# Patient Record
Sex: Male | Born: 1940 | Hispanic: No | Marital: Married | Smoking: Current some day smoker
Health system: Southern US, Community
[De-identification: ages and names within clinical notes are randomized; demographics above are authoritative.]

## PROBLEM LIST (undated history)

## (undated) DIAGNOSIS — I252 Old myocardial infarction: Secondary | ICD-10-CM

## (undated) DIAGNOSIS — I10 Essential (primary) hypertension: Secondary | ICD-10-CM

## (undated) DIAGNOSIS — C4339 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: Secondary | ICD-10-CM

## (undated) DIAGNOSIS — J449 Chronic obstructive pulmonary disease, unspecified: Secondary | ICD-10-CM

## (undated) DIAGNOSIS — C801 Malignant (primary) neoplasm, unspecified: Secondary | ICD-10-CM

## (undated) DIAGNOSIS — I509 Heart failure, unspecified: Secondary | ICD-10-CM

## (undated) DIAGNOSIS — E785 Hyperlipidemia, unspecified: Secondary | ICD-10-CM

## (undated) DIAGNOSIS — E119 Type 2 diabetes mellitus without complications: Secondary | ICD-10-CM

## (undated) HISTORY — DX: Old myocardial infarction: I25.2

## (undated) HISTORY — PX: CARDIAC CATHETERIZATION: SHX172

## (undated) HISTORY — DX: Hyperlipidemia, unspecified: E78.5

## (undated) HISTORY — DX: Chronic obstructive pulmonary disease, unspecified: J44.9

## (undated) HISTORY — PX: CORONARY ANGIOPLASTY: SHX604

## (undated) HISTORY — DX: 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: C43.39

## (undated) HISTORY — PX: APPENDECTOMY: SHX54

## (undated) HISTORY — PX: PACEMAKER INSERTION: SHX728

## (undated) MED FILL — Nivolumab IV Soln 240 MG/24ML: INTRAVENOUS | Qty: 48 | Status: AC

---

## 2015-03-20 DIAGNOSIS — C4339 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: Secondary | ICD-10-CM | POA: Insufficient documentation

## 2015-08-15 DIAGNOSIS — I10 Essential (primary) hypertension: Secondary | ICD-10-CM | POA: Diagnosis not present

## 2015-08-15 DIAGNOSIS — E119 Type 2 diabetes mellitus without complications: Secondary | ICD-10-CM | POA: Diagnosis not present

## 2015-08-15 DIAGNOSIS — Z803 Family history of malignant neoplasm of breast: Secondary | ICD-10-CM | POA: Diagnosis not present

## 2015-08-15 DIAGNOSIS — Z809 Family history of malignant neoplasm, unspecified: Secondary | ICD-10-CM

## 2015-08-15 DIAGNOSIS — Z8041 Family history of malignant neoplasm of ovary: Secondary | ICD-10-CM

## 2015-08-15 DIAGNOSIS — C4330 Malignant melanoma of unspecified part of face: Secondary | ICD-10-CM | POA: Diagnosis not present

## 2015-09-04 DIAGNOSIS — C4339 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: Secondary | ICD-10-CM | POA: Diagnosis not present

## 2015-09-10 ENCOUNTER — Encounter: Payer: Self-pay | Admitting: Oncology

## 2015-09-24 DIAGNOSIS — I10 Essential (primary) hypertension: Secondary | ICD-10-CM

## 2015-09-24 DIAGNOSIS — C4330 Malignant melanoma of unspecified part of face: Secondary | ICD-10-CM | POA: Diagnosis not present

## 2015-09-24 DIAGNOSIS — E119 Type 2 diabetes mellitus without complications: Secondary | ICD-10-CM | POA: Diagnosis not present

## 2015-09-24 DIAGNOSIS — R0609 Other forms of dyspnea: Secondary | ICD-10-CM | POA: Diagnosis not present

## 2015-09-24 DIAGNOSIS — J449 Chronic obstructive pulmonary disease, unspecified: Secondary | ICD-10-CM | POA: Diagnosis not present

## 2015-10-15 DIAGNOSIS — R195 Other fecal abnormalities: Secondary | ICD-10-CM

## 2015-10-15 DIAGNOSIS — I10 Essential (primary) hypertension: Secondary | ICD-10-CM

## 2015-10-15 DIAGNOSIS — C4330 Malignant melanoma of unspecified part of face: Secondary | ICD-10-CM | POA: Diagnosis not present

## 2015-10-15 DIAGNOSIS — E119 Type 2 diabetes mellitus without complications: Secondary | ICD-10-CM | POA: Diagnosis not present

## 2015-10-15 DIAGNOSIS — J449 Chronic obstructive pulmonary disease, unspecified: Secondary | ICD-10-CM | POA: Diagnosis not present

## 2015-12-17 DIAGNOSIS — I10 Essential (primary) hypertension: Secondary | ICD-10-CM

## 2015-12-17 DIAGNOSIS — E538 Deficiency of other specified B group vitamins: Secondary | ICD-10-CM | POA: Diagnosis not present

## 2015-12-17 DIAGNOSIS — E119 Type 2 diabetes mellitus without complications: Secondary | ICD-10-CM

## 2015-12-17 DIAGNOSIS — D649 Anemia, unspecified: Secondary | ICD-10-CM | POA: Diagnosis not present

## 2015-12-17 DIAGNOSIS — C4330 Malignant melanoma of unspecified part of face: Secondary | ICD-10-CM | POA: Diagnosis not present

## 2015-12-17 DIAGNOSIS — L989 Disorder of the skin and subcutaneous tissue, unspecified: Secondary | ICD-10-CM | POA: Diagnosis not present

## 2015-12-31 DIAGNOSIS — E119 Type 2 diabetes mellitus without complications: Secondary | ICD-10-CM | POA: Diagnosis not present

## 2015-12-31 DIAGNOSIS — E876 Hypokalemia: Secondary | ICD-10-CM

## 2015-12-31 DIAGNOSIS — E538 Deficiency of other specified B group vitamins: Secondary | ICD-10-CM | POA: Diagnosis not present

## 2015-12-31 DIAGNOSIS — I10 Essential (primary) hypertension: Secondary | ICD-10-CM | POA: Diagnosis not present

## 2015-12-31 DIAGNOSIS — C4330 Malignant melanoma of unspecified part of face: Secondary | ICD-10-CM | POA: Diagnosis not present

## 2016-01-28 DIAGNOSIS — E119 Type 2 diabetes mellitus without complications: Secondary | ICD-10-CM

## 2016-01-28 DIAGNOSIS — R0609 Other forms of dyspnea: Secondary | ICD-10-CM | POA: Diagnosis not present

## 2016-01-28 DIAGNOSIS — J449 Chronic obstructive pulmonary disease, unspecified: Secondary | ICD-10-CM | POA: Diagnosis not present

## 2016-01-28 DIAGNOSIS — I10 Essential (primary) hypertension: Secondary | ICD-10-CM

## 2016-01-28 DIAGNOSIS — E538 Deficiency of other specified B group vitamins: Secondary | ICD-10-CM

## 2016-01-28 DIAGNOSIS — C4330 Malignant melanoma of unspecified part of face: Secondary | ICD-10-CM | POA: Diagnosis not present

## 2016-01-28 DIAGNOSIS — E876 Hypokalemia: Secondary | ICD-10-CM

## 2016-01-28 DIAGNOSIS — D638 Anemia in other chronic diseases classified elsewhere: Secondary | ICD-10-CM | POA: Diagnosis not present

## 2016-03-14 DIAGNOSIS — C4339 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: Secondary | ICD-10-CM | POA: Diagnosis not present

## 2016-03-28 DIAGNOSIS — C4339 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: Secondary | ICD-10-CM | POA: Diagnosis not present

## 2016-05-16 DIAGNOSIS — C4339 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: Secondary | ICD-10-CM | POA: Diagnosis not present

## 2017-01-15 DIAGNOSIS — C439 Malignant melanoma of skin, unspecified: Secondary | ICD-10-CM

## 2017-01-15 DIAGNOSIS — I313 Pericardial effusion (noninflammatory): Secondary | ICD-10-CM | POA: Diagnosis not present

## 2017-01-15 DIAGNOSIS — I4891 Unspecified atrial fibrillation: Secondary | ICD-10-CM | POA: Diagnosis not present

## 2017-01-15 DIAGNOSIS — J189 Pneumonia, unspecified organism: Secondary | ICD-10-CM | POA: Diagnosis not present

## 2017-01-16 DIAGNOSIS — C4339 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: Secondary | ICD-10-CM | POA: Diagnosis not present

## 2017-01-16 DIAGNOSIS — R918 Other nonspecific abnormal finding of lung field: Secondary | ICD-10-CM | POA: Diagnosis not present

## 2017-01-16 DIAGNOSIS — I4891 Unspecified atrial fibrillation: Secondary | ICD-10-CM | POA: Diagnosis not present

## 2017-01-16 DIAGNOSIS — I509 Heart failure, unspecified: Secondary | ICD-10-CM

## 2017-01-16 DIAGNOSIS — I313 Pericardial effusion (noninflammatory): Secondary | ICD-10-CM | POA: Diagnosis not present

## 2017-01-16 DIAGNOSIS — I429 Cardiomyopathy, unspecified: Secondary | ICD-10-CM

## 2017-01-16 DIAGNOSIS — J188 Other pneumonia, unspecified organism: Secondary | ICD-10-CM

## 2017-01-16 DIAGNOSIS — C439 Malignant melanoma of skin, unspecified: Secondary | ICD-10-CM | POA: Diagnosis not present

## 2017-01-16 DIAGNOSIS — J189 Pneumonia, unspecified organism: Secondary | ICD-10-CM | POA: Diagnosis not present

## 2017-01-16 DIAGNOSIS — J90 Pleural effusion, not elsewhere classified: Secondary | ICD-10-CM | POA: Diagnosis not present

## 2017-01-17 DIAGNOSIS — D649 Anemia, unspecified: Secondary | ICD-10-CM | POA: Diagnosis not present

## 2017-01-17 DIAGNOSIS — I313 Pericardial effusion (noninflammatory): Secondary | ICD-10-CM

## 2017-01-17 DIAGNOSIS — C439 Malignant melanoma of skin, unspecified: Secondary | ICD-10-CM | POA: Diagnosis not present

## 2017-01-17 DIAGNOSIS — I509 Heart failure, unspecified: Secondary | ICD-10-CM | POA: Diagnosis not present

## 2017-01-17 DIAGNOSIS — J189 Pneumonia, unspecified organism: Secondary | ICD-10-CM | POA: Diagnosis not present

## 2017-01-17 DIAGNOSIS — J918 Pleural effusion in other conditions classified elsewhere: Secondary | ICD-10-CM

## 2017-01-17 DIAGNOSIS — R918 Other nonspecific abnormal finding of lung field: Secondary | ICD-10-CM | POA: Diagnosis not present

## 2017-01-17 DIAGNOSIS — C4339 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: Secondary | ICD-10-CM

## 2017-01-17 DIAGNOSIS — I4891 Unspecified atrial fibrillation: Secondary | ICD-10-CM | POA: Diagnosis not present

## 2017-01-17 DIAGNOSIS — I255 Ischemic cardiomyopathy: Secondary | ICD-10-CM

## 2017-01-18 DIAGNOSIS — J189 Pneumonia, unspecified organism: Secondary | ICD-10-CM | POA: Diagnosis not present

## 2017-01-18 DIAGNOSIS — I313 Pericardial effusion (noninflammatory): Secondary | ICD-10-CM | POA: Diagnosis not present

## 2017-01-18 DIAGNOSIS — I4891 Unspecified atrial fibrillation: Secondary | ICD-10-CM | POA: Diagnosis not present

## 2017-01-18 DIAGNOSIS — C439 Malignant melanoma of skin, unspecified: Secondary | ICD-10-CM | POA: Diagnosis not present

## 2017-01-19 DIAGNOSIS — I4891 Unspecified atrial fibrillation: Secondary | ICD-10-CM | POA: Diagnosis not present

## 2017-01-19 DIAGNOSIS — I313 Pericardial effusion (noninflammatory): Secondary | ICD-10-CM | POA: Diagnosis not present

## 2017-01-19 DIAGNOSIS — C439 Malignant melanoma of skin, unspecified: Secondary | ICD-10-CM | POA: Diagnosis not present

## 2017-01-19 DIAGNOSIS — J189 Pneumonia, unspecified organism: Secondary | ICD-10-CM | POA: Diagnosis not present

## 2017-01-30 DIAGNOSIS — C4339 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: Secondary | ICD-10-CM | POA: Diagnosis not present

## 2017-01-30 DIAGNOSIS — I509 Heart failure, unspecified: Secondary | ICD-10-CM | POA: Diagnosis not present

## 2017-01-30 DIAGNOSIS — D649 Anemia, unspecified: Secondary | ICD-10-CM | POA: Diagnosis not present

## 2017-01-30 DIAGNOSIS — E538 Deficiency of other specified B group vitamins: Secondary | ICD-10-CM | POA: Diagnosis not present

## 2017-02-13 DIAGNOSIS — C4330 Malignant melanoma of unspecified part of face: Secondary | ICD-10-CM | POA: Diagnosis not present

## 2017-02-13 DIAGNOSIS — I10 Essential (primary) hypertension: Secondary | ICD-10-CM

## 2017-02-13 DIAGNOSIS — E119 Type 2 diabetes mellitus without complications: Secondary | ICD-10-CM

## 2017-02-13 DIAGNOSIS — J449 Chronic obstructive pulmonary disease, unspecified: Secondary | ICD-10-CM

## 2017-02-13 DIAGNOSIS — R531 Weakness: Secondary | ICD-10-CM

## 2017-02-13 DIAGNOSIS — R197 Diarrhea, unspecified: Secondary | ICD-10-CM

## 2017-02-13 DIAGNOSIS — D649 Anemia, unspecified: Secondary | ICD-10-CM | POA: Diagnosis not present

## 2017-02-13 DIAGNOSIS — R0609 Other forms of dyspnea: Secondary | ICD-10-CM

## 2017-02-13 DIAGNOSIS — C7989 Secondary malignant neoplasm of other specified sites: Secondary | ICD-10-CM | POA: Diagnosis not present

## 2017-02-13 DIAGNOSIS — E538 Deficiency of other specified B group vitamins: Secondary | ICD-10-CM

## 2017-02-13 DIAGNOSIS — Z950 Presence of cardiac pacemaker: Secondary | ICD-10-CM

## 2017-02-13 DIAGNOSIS — R634 Abnormal weight loss: Secondary | ICD-10-CM

## 2017-02-13 DIAGNOSIS — I2510 Atherosclerotic heart disease of native coronary artery without angina pectoris: Secondary | ICD-10-CM

## 2017-03-13 DIAGNOSIS — C770 Secondary and unspecified malignant neoplasm of lymph nodes of head, face and neck: Secondary | ICD-10-CM | POA: Diagnosis not present

## 2017-03-13 DIAGNOSIS — C4339 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: Secondary | ICD-10-CM | POA: Diagnosis not present

## 2017-04-10 DIAGNOSIS — I5181 Takotsubo syndrome: Secondary | ICD-10-CM | POA: Diagnosis not present

## 2017-04-10 DIAGNOSIS — C770 Secondary and unspecified malignant neoplasm of lymph nodes of head, face and neck: Secondary | ICD-10-CM | POA: Diagnosis not present

## 2017-04-10 DIAGNOSIS — C4339 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: Secondary | ICD-10-CM | POA: Diagnosis not present

## 2017-06-16 DIAGNOSIS — I509 Heart failure, unspecified: Secondary | ICD-10-CM | POA: Diagnosis not present

## 2017-06-16 DIAGNOSIS — C770 Secondary and unspecified malignant neoplasm of lymph nodes of head, face and neck: Secondary | ICD-10-CM | POA: Diagnosis not present

## 2017-06-16 DIAGNOSIS — D649 Anemia, unspecified: Secondary | ICD-10-CM | POA: Diagnosis not present

## 2017-06-16 DIAGNOSIS — C4339 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: Secondary | ICD-10-CM | POA: Diagnosis not present

## 2017-06-16 DIAGNOSIS — E538 Deficiency of other specified B group vitamins: Secondary | ICD-10-CM | POA: Diagnosis not present

## 2017-08-31 DIAGNOSIS — R944 Abnormal results of kidney function studies: Secondary | ICD-10-CM | POA: Diagnosis not present

## 2017-08-31 DIAGNOSIS — R634 Abnormal weight loss: Secondary | ICD-10-CM | POA: Diagnosis not present

## 2017-08-31 DIAGNOSIS — C4339 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: Secondary | ICD-10-CM | POA: Diagnosis not present

## 2017-08-31 DIAGNOSIS — C770 Secondary and unspecified malignant neoplasm of lymph nodes of head, face and neck: Secondary | ICD-10-CM | POA: Diagnosis not present

## 2017-08-31 DIAGNOSIS — I509 Heart failure, unspecified: Secondary | ICD-10-CM | POA: Diagnosis not present

## 2017-08-31 DIAGNOSIS — E538 Deficiency of other specified B group vitamins: Secondary | ICD-10-CM | POA: Diagnosis not present

## 2017-09-28 DIAGNOSIS — C4339 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: Secondary | ICD-10-CM | POA: Diagnosis not present

## 2018-02-01 DIAGNOSIS — R609 Edema, unspecified: Secondary | ICD-10-CM | POA: Diagnosis not present

## 2018-02-01 DIAGNOSIS — C4339 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: Secondary | ICD-10-CM | POA: Diagnosis not present

## 2018-03-25 ENCOUNTER — Encounter (HOSPITAL_COMMUNITY): Payer: Self-pay | Admitting: Emergency Medicine

## 2018-03-25 ENCOUNTER — Emergency Department (HOSPITAL_COMMUNITY)
Admission: EM | Admit: 2018-03-25 | Discharge: 2018-03-25 | Disposition: A | Discharge status: Home / Self Care | Payer: Medicare PPO | Attending: Emergency Medicine | Admitting: Emergency Medicine

## 2018-03-25 ENCOUNTER — Other Ambulatory Visit: Payer: Self-pay

## 2018-03-25 DIAGNOSIS — E162 Hypoglycemia, unspecified: Secondary | ICD-10-CM

## 2018-03-25 DIAGNOSIS — E11649 Type 2 diabetes mellitus with hypoglycemia without coma: Secondary | ICD-10-CM | POA: Diagnosis present

## 2018-03-25 DIAGNOSIS — J449 Chronic obstructive pulmonary disease, unspecified: Secondary | ICD-10-CM | POA: Insufficient documentation

## 2018-03-25 DIAGNOSIS — F17210 Nicotine dependence, cigarettes, uncomplicated: Secondary | ICD-10-CM | POA: Diagnosis not present

## 2018-03-25 DIAGNOSIS — Z95810 Presence of automatic (implantable) cardiac defibrillator: Secondary | ICD-10-CM | POA: Insufficient documentation

## 2018-03-25 DIAGNOSIS — I10 Essential (primary) hypertension: Secondary | ICD-10-CM | POA: Insufficient documentation

## 2018-03-25 LAB — CBC WITH DIFFERENTIAL/PLATELET
Abs Immature Granulocytes: 0 10*3/uL (ref 0.0–0.1)
Basophils Absolute: 0 10*3/uL (ref 0.0–0.1)
Basophils Relative: 1 %
EOS ABS: 0.1 10*3/uL (ref 0.0–0.7)
EOS PCT: 1 %
HEMATOCRIT: 36 % — AB (ref 39.0–52.0)
HEMOGLOBIN: 11.4 g/dL — AB (ref 13.0–17.0)
Immature Granulocytes: 0 %
LYMPHS ABS: 0.8 10*3/uL (ref 0.7–4.0)
LYMPHS PCT: 15 %
MCH: 29.7 pg (ref 26.0–34.0)
MCHC: 31.7 g/dL (ref 30.0–36.0)
MCV: 93.8 fL (ref 78.0–100.0)
MONO ABS: 0.5 10*3/uL (ref 0.1–1.0)
Monocytes Relative: 9 %
Neutro Abs: 3.7 10*3/uL (ref 1.7–7.7)
Neutrophils Relative %: 74 %
Platelets: 194 10*3/uL (ref 150–400)
RBC: 3.84 MIL/uL — ABNORMAL LOW (ref 4.22–5.81)
RDW: 14.7 % (ref 11.5–15.5)
WBC: 5.1 10*3/uL (ref 4.0–10.5)

## 2018-03-25 LAB — I-STAT TROPONIN, ED: TROPONIN I, POC: 0 ng/mL (ref 0.00–0.08)

## 2018-03-25 LAB — CBG MONITORING, ED
GLUCOSE-CAPILLARY: 81 mg/dL (ref 65–99)
Glucose-Capillary: 102 mg/dL — ABNORMAL HIGH (ref 65–99)
Glucose-Capillary: 121 mg/dL — ABNORMAL HIGH (ref 65–99)
Glucose-Capillary: 152 mg/dL — ABNORMAL HIGH (ref 65–99)

## 2018-03-25 LAB — BASIC METABOLIC PANEL
Anion gap: 9 (ref 5–15)
BUN: 21 mg/dL — AB (ref 6–20)
CHLORIDE: 106 mmol/L (ref 101–111)
CO2: 25 mmol/L (ref 22–32)
CREATININE: 1.37 mg/dL — AB (ref 0.61–1.24)
Calcium: 9.3 mg/dL (ref 8.9–10.3)
GFR calc Af Amer: 56 mL/min — ABNORMAL LOW (ref >60)
GFR calc non Af Amer: 49 mL/min — ABNORMAL LOW (ref >60)
GLUCOSE: 89 mg/dL (ref 65–99)
Potassium: 4.6 mmol/L (ref 3.5–5.1)
Sodium: 140 mmol/L (ref 135–145)

## 2018-03-25 MED ORDER — LACTATED RINGERS IV BOLUS
1000.0000 mL | Freq: Once | INTRAVENOUS | Status: AC
  Administered 2018-03-25: 1000 mL via INTRAVENOUS

## 2018-03-25 NOTE — ED Notes (Signed)
EKG given to EDP.  

## 2018-03-25 NOTE — ED Notes (Signed)
ED Provider at bedside. 

## 2018-03-25 NOTE — ED Triage Notes (Signed)
Pt  Here from with c/o low blood sugar of 46 , pt given 250cc of D10 cgb back to normal on arrival to the ED

## 2018-03-25 NOTE — ED Provider Notes (Signed)
[facility] EMERGENCY DEPARTMENT Provider Note   CSN: [id number] Arrival date & time: 03/25/18  0277     History   Chief Complaint Chief Complaint  Patient presents with  . Hypoglycemia    HPI [name] is a 77 y.o. male.  HPI Patient is a 77 year old male with a past medical history of diabetes, COPD, hypertension, ICD placement who comes in today brought in by EMS for hypoglycemia.  History provided by both patient as well as patient's daughter.  Patient currently on metformin as well as 3 sulfonylureas.  Patient's been told multiple times that he should be switched to insulin, however patient has a "deathly fear" of needles and does not tolerate fingersticks or injections and has refused.  Patient states that he generally snacks on candy" junk food" all day.  However, yesterday patient states that he did not have much of an appetite and so he had some peaches and a sandwich.  Patient states that he became fatigued and lightheaded later in the evening and had worsening condition overnight.  Daughter states there was some confusion this morning as well.  When EMS arrived, patient's blood glucose level was mid 40s.  Patient was given amp of D10 in route with return of normal mental status.  Patient denies any recent illness, fevers, chills, nausea vomiting chest pain abdominal pain or shortness of breath. History reviewed. No pertinent past medical history.  There are no active problems to display for this patient.   History reviewed. No pertinent surgical history.      Home Medications    Prior to Admission medications   Not on File    Family History History reviewed. No pertinent family history.  Social History Social History   Tobacco Use  . Smoking status: Current Every Day Smoker  Substance Use Topics  . Alcohol use: Not Currently  . Drug use: Not on file     Allergies   Patient has no known allergies.   Review of Systems Review  of Systems  Constitutional: Positive for fatigue. Negative for chills and fever.  Respiratory: Negative for shortness of breath.   Cardiovascular: Negative for chest pain.  Gastrointestinal: Negative for abdominal pain, constipation, diarrhea, nausea and vomiting.  Neurological: Positive for weakness and light-headedness.  Psychiatric/Behavioral: Positive for confusion.  All other systems reviewed and are negative.    Physical Exam Updated Vital Signs BP 115/72 (BP Location: Right Arm)   Pulse 90   Resp 16   SpO2 99%   Physical Exam  Constitutional: He is oriented to person, place, and time. He appears well-developed and well-nourished.  HENT:  Head: Normocephalic and atraumatic.  Eyes: Pupils are equal, round, and reactive to light. Conjunctivae and EOM are normal.  Neck: Neck supple.  Cardiovascular: Normal rate and regular rhythm.  No murmur heard. Pulmonary/Chest: Effort normal and breath sounds normal. No respiratory distress.  Abdominal: Soft. There is no tenderness.  Musculoskeletal: He exhibits no edema.  Neurological: He is alert and oriented to person, place, and time. No cranial nerve deficit or sensory deficit. He exhibits normal muscle tone. Coordination normal.  Skin: Skin is warm and dry.  Psychiatric: He has a normal mood and affect.  Nursing note and vitals reviewed.    ED Treatments / Results  Labs (all labs ordered are listed, but only abnormal results are displayed) Labs Reviewed  CBC WITH DIFFERENTIAL/PLATELET - Abnormal; Notable for the following components:      Result Value   RBC  3.84 (*)    Hemoglobin 11.4 (*)    HCT 36.0 (*)    All other components within normal limits  BASIC METABOLIC PANEL - Abnormal; Notable for the following components:   BUN 21 (*)    Creatinine, Ser 1.37 (*)    GFR calc non Af Amer 49 (*)    GFR calc Af Amer 56 (*)    All other components within normal limits  CBG MONITORING, ED - Abnormal; Notable for the following  components:   Glucose-Capillary 102 (*)    All other components within normal limits  CBG MONITORING, ED - Abnormal; Notable for the following components:   Glucose-Capillary 121 (*)    All other components within normal limits  CBG MONITORING, ED - Abnormal; Notable for the following components:   Glucose-Capillary 152 (*)    All other components within normal limits  URINALYSIS, ROUTINE W REFLEX MICROSCOPIC  CBG MONITORING, ED  I-STAT TROPONIN, ED  CBG MONITORING, ED  CBG MONITORING, ED  CBG MONITORING, ED  CBG MONITORING, ED    EKG EKG Interpretation  Date/Time:  Thursday Mar 25 2018 10:19:00 EDT Ventricular Rate:  81 PR Interval:    QRS Duration: 171 QT Interval:  438 QTC Calculation: 509 R Axis:   -80 Text Interpretation:  Sinus rhythm Nonspecific IVCD with LAD LVH with secondary repolarization abnormality No old tracing to compare Confirmed by Duffy Bruce [phone]) on 03/25/2018 10:30:26 AM   Radiology No results found.  Procedures Procedures (including critical care time)  Medications Ordered in ED Medications  lactated ringers bolus 1,000 mL (0 mLs Intravenous Stopped 03/25/18 1500)     Initial Impression / Assessment and Plan / ED Course  I have reviewed the triage vital signs and the nursing notes.  Pertinent labs & imaging results that were available during my care of the patient were reviewed by me and considered in my medical decision making (see chart for details).    Patient's laboratory work-up largely within normal limits without concerning findings with the exception of mild elevation of creatinine.  Patient had multiple blood glucose levels checked and was able to maintain appropriate range.  Patient instructed to follow-up with PCP regarding medication for his hyperglycemia and instructions regarding administration on days which he does not eat as much. Pt given appropriate f/u and return precautions. Pt voiced understanding and is agreeable to  discharge at this time.     Final Clinical Impressions(s) / ED Diagnoses   Final diagnoses:  Hypoglycemia    ED Discharge Orders    None       Chapman Moss, MD 03/25/18 1629    Duffy Bruce, MD 03/25/18 [phone]

## 2018-03-25 NOTE — ED Notes (Signed)
D/c reviewed with patient and daughter 

## 2018-04-01 DIAGNOSIS — E1165 Type 2 diabetes mellitus with hyperglycemia: Secondary | ICD-10-CM | POA: Diagnosis not present

## 2018-04-01 DIAGNOSIS — C4339 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: Secondary | ICD-10-CM | POA: Diagnosis not present

## 2018-04-01 DIAGNOSIS — I509 Heart failure, unspecified: Secondary | ICD-10-CM | POA: Diagnosis not present

## 2018-04-01 DIAGNOSIS — Z79899 Other long term (current) drug therapy: Secondary | ICD-10-CM | POA: Diagnosis not present

## 2018-04-01 DIAGNOSIS — E538 Deficiency of other specified B group vitamins: Secondary | ICD-10-CM | POA: Diagnosis not present

## 2018-04-01 DIAGNOSIS — E875 Hyperkalemia: Secondary | ICD-10-CM | POA: Diagnosis not present

## 2018-04-01 DIAGNOSIS — L270 Generalized skin eruption due to drugs and medicaments taken internally: Secondary | ICD-10-CM | POA: Diagnosis not present

## 2018-04-01 DIAGNOSIS — N189 Chronic kidney disease, unspecified: Secondary | ICD-10-CM | POA: Diagnosis not present

## 2018-04-20 ENCOUNTER — Observation Stay (HOSPITAL_COMMUNITY)
Admission: EM | Admit: 2018-04-20 | Discharge: 2018-04-22 | Disposition: A | Discharge status: Home / Self Care | Payer: Medicare PPO | Attending: Family Medicine | Admitting: Family Medicine

## 2018-04-20 ENCOUNTER — Other Ambulatory Visit: Payer: Self-pay

## 2018-04-20 ENCOUNTER — Encounter (HOSPITAL_COMMUNITY): Payer: Self-pay | Admitting: Emergency Medicine

## 2018-04-20 DIAGNOSIS — Z9221 Personal history of antineoplastic chemotherapy: Secondary | ICD-10-CM | POA: Diagnosis not present

## 2018-04-20 DIAGNOSIS — Z950 Presence of cardiac pacemaker: Secondary | ICD-10-CM | POA: Diagnosis not present

## 2018-04-20 DIAGNOSIS — Z79899 Other long term (current) drug therapy: Secondary | ICD-10-CM | POA: Diagnosis not present

## 2018-04-20 DIAGNOSIS — Z9889 Other specified postprocedural states: Secondary | ICD-10-CM | POA: Insufficient documentation

## 2018-04-20 DIAGNOSIS — I5042 Chronic combined systolic (congestive) and diastolic (congestive) heart failure: Secondary | ICD-10-CM | POA: Insufficient documentation

## 2018-04-20 DIAGNOSIS — E876 Hypokalemia: Secondary | ICD-10-CM | POA: Diagnosis not present

## 2018-04-20 DIAGNOSIS — I429 Cardiomyopathy, unspecified: Secondary | ICD-10-CM | POA: Diagnosis not present

## 2018-04-20 DIAGNOSIS — I110 Hypertensive heart disease with heart failure: Secondary | ICD-10-CM | POA: Diagnosis not present

## 2018-04-20 DIAGNOSIS — I5022 Chronic systolic (congestive) heart failure: Secondary | ICD-10-CM | POA: Diagnosis present

## 2018-04-20 DIAGNOSIS — Z95810 Presence of automatic (implantable) cardiac defibrillator: Secondary | ICD-10-CM | POA: Insufficient documentation

## 2018-04-20 DIAGNOSIS — Z85820 Personal history of malignant melanoma of skin: Secondary | ICD-10-CM | POA: Insufficient documentation

## 2018-04-20 DIAGNOSIS — Z7982 Long term (current) use of aspirin: Secondary | ICD-10-CM | POA: Diagnosis not present

## 2018-04-20 DIAGNOSIS — E11649 Type 2 diabetes mellitus with hypoglycemia without coma: Principal | ICD-10-CM | POA: Insufficient documentation

## 2018-04-20 DIAGNOSIS — E119 Type 2 diabetes mellitus without complications: Secondary | ICD-10-CM

## 2018-04-20 DIAGNOSIS — Z7984 Long term (current) use of oral hypoglycemic drugs: Secondary | ICD-10-CM | POA: Diagnosis not present

## 2018-04-20 DIAGNOSIS — I491 Atrial premature depolarization: Secondary | ICD-10-CM | POA: Insufficient documentation

## 2018-04-20 DIAGNOSIS — E785 Hyperlipidemia, unspecified: Secondary | ICD-10-CM | POA: Diagnosis not present

## 2018-04-20 DIAGNOSIS — E162 Hypoglycemia, unspecified: Secondary | ICD-10-CM | POA: Diagnosis present

## 2018-04-20 DIAGNOSIS — I10 Essential (primary) hypertension: Secondary | ICD-10-CM | POA: Diagnosis present

## 2018-04-20 HISTORY — DX: Malignant (primary) neoplasm, unspecified: C80.1

## 2018-04-20 HISTORY — DX: Heart failure, unspecified: I50.9

## 2018-04-20 HISTORY — DX: Type 2 diabetes mellitus without complications: E11.9

## 2018-04-20 HISTORY — DX: Essential (primary) hypertension: I10

## 2018-04-20 LAB — CBC
HCT: 34 % — ABNORMAL LOW (ref 39.0–52.0)
Hemoglobin: 10.9 g/dL — ABNORMAL LOW (ref 13.0–17.0)
MCH: 29.9 pg (ref 26.0–34.0)
MCHC: 32.1 g/dL (ref 30.0–36.0)
MCV: 93.4 fL (ref 78.0–100.0)
Platelets: 170 10*3/uL (ref 150–400)
RBC: 3.64 MIL/uL — AB (ref 4.22–5.81)
RDW: 15 % (ref 11.5–15.5)
WBC: 6.1 10*3/uL (ref 4.0–10.5)

## 2018-04-20 LAB — COMPREHENSIVE METABOLIC PANEL
ALT: 10 U/L — AB (ref 17–63)
AST: 15 U/L (ref 15–41)
Albumin: 3.7 g/dL (ref 3.5–5.0)
Alkaline Phosphatase: 54 U/L (ref 38–126)
Anion gap: 10 (ref 5–15)
BILIRUBIN TOTAL: 0.7 mg/dL (ref 0.3–1.2)
BUN: 12 mg/dL (ref 6–20)
CHLORIDE: 101 mmol/L (ref 101–111)
CO2: 29 mmol/L (ref 22–32)
CREATININE: 1.15 mg/dL (ref 0.61–1.24)
Calcium: 9.2 mg/dL (ref 8.9–10.3)
GFR, EST NON AFRICAN AMERICAN: 60 mL/min — AB (ref >60)
Glucose, Bld: 153 mg/dL — ABNORMAL HIGH (ref 65–99)
POTASSIUM: 3.4 mmol/L — AB (ref 3.5–5.1)
Sodium: 140 mmol/L (ref 135–145)
TOTAL PROTEIN: 6.3 g/dL — AB (ref 6.5–8.1)

## 2018-04-20 LAB — CBG MONITORING, ED
GLUCOSE-CAPILLARY: 144 mg/dL — AB (ref 65–99)
GLUCOSE-CAPILLARY: 83 mg/dL (ref 65–99)

## 2018-04-20 NOTE — ED Triage Notes (Signed)
Pt BIB EMS from home. Family reports AMS, diaphoretic. CBG 29 on scene. Given 25g D10 by EMS. Family fed on scene. 18RAC. 110/54, 96%RA, P72. Hx of afib.

## 2018-04-20 NOTE — ED Notes (Signed)
Per family, they gave pt a hamburger from home about 70min ago. Burger only, no sides or drink.

## 2018-04-20 NOTE — ED Provider Notes (Signed)
[facility] EMERGENCY DEPARTMENT Provider Note   CSN: [id number] Arrival date & time: 04/20/18  2043     History   Chief Complaint Chief Complaint  Patient presents with  . Altered Mental Status  . Hypoglycemia    HPI Kevin [name] is a 76 y.o. male.  Patient is a 77 year old male with a history of diabetes, hypertension, CHF who presents with hypoglycemia.  He states that he has felt a little bad since last night and felt like his blood sugar was getting low.  He does not have a glucometer at home to check his sugars.  He did take all his regular medications today and he says he has been eating throughout the day but he does not normally eat very much.  He became less responsive at home and EMS was called.  They found his blood sugar to be 29.  He was given D50.  He states he feels back to baseline now.  He denies any unilateral weakness.  No slurred speech.  No fevers.  No cough or congestion.  No other recent illnesses.  No vomiting or diarrhea.  No recent changes in his medications.     Past Medical History:  Diagnosis Date  . Cancer ([place])   . CHF (congestive heart failure) ([place])   . Diabetes mellitus without complication ([place])   . Hypertension     There are no active problems to display for this patient.   History reviewed. No pertinent surgical history.      Home Medications    Prior to Admission medications   Medication Sig Start Date End Date Taking? Authorizing Provider  albuterol (PROVENTIL HFA;VENTOLIN HFA) 108 (90 Base) MCG/ACT inhaler Inhale 1-2 puffs into the lungs every 6 (six) hours as needed for wheezing or shortness of breath.   Yes [provider]  albuterol (PROVENTIL) (2.5 MG/3ML) 0.083% nebulizer solution Take 2.5 mg by nebulization every 6 (six) hours as needed for wheezing or shortness of breath.   Yes [provider]  amiodarone (PACERONE) 200 MG tablet Take 100 mg by mouth daily.   Yes [provider]  aspirin EC 81 MG tablet Take 81 mg by mouth daily.   Yes [provider]  atorvastatin (LIPITOR) 80 MG tablet Take 80 mg by mouth at bedtime.   Yes [provider]  furosemide (LASIX) 40 MG tablet Take 40 mg by mouth daily.   Yes [provider]  glimepiride (AMARYL) 4 MG tablet Take 4 mg by mouth 2 (two) times daily.   Yes [provider]  lisinopril (PRINIVIL,ZESTRIL) 20 MG tablet Take 20 mg by mouth daily.   Yes [provider]  magnesium oxide (MAG-OX) 400 (241.3 Mg) MG tablet Take 400 mg by mouth 2 (two) times daily.   Yes [provider]  metFORMIN (GLUCOPHAGE) 500 MG tablet Take 1,000 mg by mouth 2 (two) times daily with a meal.   Yes [provider]  pioglitazone (ACTOS) 30 MG tablet Take 30 mg by mouth daily.   Yes [provider]  sitaGLIPtin (JANUVIA) 100 MG tablet Take 100 mg by mouth daily.   Yes [provider]  spironolactone (ALDACTONE) 50 MG tablet Take 50 mg by mouth daily.   Yes [provider]  Vitamin D, Ergocalciferol, (DRISDOL) 50000 units CAPS capsule Take 50,000 Units by mouth every 7 (seven) days.   Yes [provider]    Family History History reviewed. No pertinent family history.  Social  History Social History   Tobacco Use  . Smoking status: Current Every Day Smoker    Packs/day: 1.00    Years: 60.00    Pack years: 60.00    Types: Cigarettes  . Smokeless tobacco: Never Used  Substance Use Topics  . Alcohol use: Not Currently  . Drug use: Not Currently     Allergies   Patient has no known allergies.   Review of Systems Review of Systems  Constitutional: Positive for fatigue. Negative for chills, diaphoresis and fever.  HENT: Negative for congestion, rhinorrhea and sneezing.   Eyes: Negative.   Respiratory: Negative for cough, chest tightness and shortness of breath.   Cardiovascular: Negative for chest pain and leg swelling.    Gastrointestinal: Negative for abdominal pain, blood in stool, diarrhea, nausea and vomiting.  Genitourinary: Negative for difficulty urinating, flank pain, frequency and hematuria.  Musculoskeletal: Negative for arthralgias and back pain.  Skin: Negative for rash.  Neurological: Positive for weakness (generalized). Negative for dizziness, speech difficulty, numbness and headaches.     Physical Exam Updated Vital Signs BP 118/83 (BP Location: Right Arm)   Pulse 82   Temp 97.9 F (36.6 C) (Oral)   Resp 12   Ht 5\' 7"  (1.702 m)   Wt 84.4 kg (186 lb)   SpO2 98%   BMI 29.13 kg/m   Physical Exam  Constitutional: He is oriented to person, place, and time. He appears well-developed and well-nourished.  HENT:  Head: Normocephalic and atraumatic.  Eyes: Pupils are equal, round, and reactive to light.  Neck: Normal range of motion. Neck supple.  Cardiovascular: Normal rate, regular rhythm and normal heart sounds.  Pulmonary/Chest: Effort normal and breath sounds normal. No respiratory distress. He has no wheezes. He has no rales. He exhibits no tenderness.  Abdominal: Soft. Bowel sounds are normal. There is no tenderness. There is no rebound and no guarding.  Musculoskeletal: Normal range of motion. He exhibits no edema.  Lymphadenopathy:    He has no cervical adenopathy.  Neurological: He is alert and oriented to person, place, and time.  Motor 5 out of 5 all extremities, sensation grossly intact light touch all extremities no pronator drift  Skin: Skin is warm and dry. No rash noted.  Psychiatric: He has a normal mood and affect.     ED Treatments / Results  Labs (all labs ordered are listed, but only abnormal results are displayed) Labs Reviewed  COMPREHENSIVE METABOLIC PANEL - Abnormal; Notable for the following components:      Result Value   Potassium 3.4 (*)    Glucose, Bld 153 (*)    Total Protein 6.3 (*)    ALT 10 (*)    GFR calc non Af Amer 60 (*)    All other  components within normal limits  CBC - Abnormal; Notable for the following components:   RBC 3.64 (*)    Hemoglobin 10.9 (*)    HCT 34.0 (*)    All other components within normal limits  CBG MONITORING, ED - Abnormal; Notable for the following components:   Glucose-Capillary 144 (*)    All other components within normal limits  CBG MONITORING, ED    EKG EKG Interpretation  Date/Time:  Tuesday April 20 2018 20:49:56 EDT Ventricular Rate:  83 PR Interval:    QRS Duration: 177 QT Interval:  436 QTC Calculation: 513 R Axis:   -70 Text Interpretation:  Sinus rhythm Atrial premature complex Nonspecific IVCD with LAD LVH with secondary repolarization abnormality  Inferior infarct, acute since last tracing no significant change Confirmed by Malvin Johns [phone]) on 04/20/2018 9:00:02 PM   Radiology No results found.  Procedures Procedures (including critical care time)  Medications Ordered in ED Medications - No data to display   Initial Impression / Assessment and Plan / ED Course  I have reviewed the triage vital signs and the nursing notes.  Pertinent labs & imaging results that were available during my care of the patient were reviewed by me and considered in my medical decision making (see chart for details).     Patient is a 77 year old male who presents with hypoglycemia.  His sugar has improved after EMS treatment.  However it is trending down.  His last CBG was 83.  That was just after he ate a hamburger.  He is on long-acting oral hypoglycemics.  He has no glucometer at home to check his blood sugar.  Given this I recommended overnight observation.  I spoke with Hal Hope who will admit the patient for further treatment.  Final Clinical Impressions(s) / ED Diagnoses   Final diagnoses:  Hypoglycemia    ED Discharge Orders    None       Malvin Johns, MD 04/21/18 0001

## 2018-04-21 ENCOUNTER — Encounter (HOSPITAL_COMMUNITY): Payer: Self-pay | Admitting: Internal Medicine

## 2018-04-21 DIAGNOSIS — I5042 Chronic combined systolic (congestive) and diastolic (congestive) heart failure: Secondary | ICD-10-CM

## 2018-04-21 DIAGNOSIS — E162 Hypoglycemia, unspecified: Secondary | ICD-10-CM | POA: Diagnosis present

## 2018-04-21 DIAGNOSIS — I5022 Chronic systolic (congestive) heart failure: Secondary | ICD-10-CM

## 2018-04-21 DIAGNOSIS — I10 Essential (primary) hypertension: Secondary | ICD-10-CM | POA: Diagnosis present

## 2018-04-21 DIAGNOSIS — E119 Type 2 diabetes mellitus without complications: Secondary | ICD-10-CM

## 2018-04-21 HISTORY — DX: Chronic systolic (congestive) heart failure: I50.22

## 2018-04-21 LAB — BASIC METABOLIC PANEL
Anion gap: 11 (ref 5–15)
BUN: 13 mg/dL (ref 6–20)
CALCIUM: 9 mg/dL (ref 8.9–10.3)
CHLORIDE: 104 mmol/L (ref 101–111)
CO2: 29 mmol/L (ref 22–32)
CREATININE: 1.08 mg/dL (ref 0.61–1.24)
GLUCOSE: 68 mg/dL (ref 65–99)
POTASSIUM: 3 mmol/L — AB (ref 3.5–5.1)
SODIUM: 144 mmol/L (ref 135–145)

## 2018-04-21 LAB — GLUCOSE, CAPILLARY
GLUCOSE-CAPILLARY: 256 mg/dL — AB (ref 65–99)
GLUCOSE-CAPILLARY: 356 mg/dL — AB (ref 65–99)
Glucose-Capillary: 262 mg/dL — ABNORMAL HIGH (ref 65–99)
Glucose-Capillary: 267 mg/dL — ABNORMAL HIGH (ref 65–99)
Glucose-Capillary: 313 mg/dL — ABNORMAL HIGH (ref 65–99)
Glucose-Capillary: 363 mg/dL — ABNORMAL HIGH (ref 65–99)
Glucose-Capillary: 342 mg/dL — ABNORMAL HIGH (ref 65–99)

## 2018-04-21 LAB — CBG MONITORING, ED
GLUCOSE-CAPILLARY: 69 mg/dL (ref 65–99)
GLUCOSE-CAPILLARY: 84 mg/dL (ref 65–99)
GLUCOSE-CAPILLARY: 126 mg/dL — AB (ref 65–99)
GLUCOSE-CAPILLARY: 94 mg/dL (ref 65–99)
GLUCOSE-CAPILLARY: 118 mg/dL — AB (ref 65–99)
GLUCOSE-CAPILLARY: 125 mg/dL — AB (ref 65–99)
Glucose-Capillary: 73 mg/dL (ref 65–99)
Glucose-Capillary: 109 mg/dL — ABNORMAL HIGH (ref 65–99)
Glucose-Capillary: 97 mg/dL (ref 65–99)
Glucose-Capillary: 73 mg/dL (ref 65–99)
Glucose-Capillary: 166 mg/dL — ABNORMAL HIGH (ref 65–99)

## 2018-04-21 LAB — CBC WITH DIFFERENTIAL/PLATELET
Abs Immature Granulocytes: 0 10*3/uL (ref 0.0–0.1)
BASOS ABS: 0 10*3/uL (ref 0.0–0.1)
BASOS PCT: 0 %
EOS ABS: 0.1 10*3/uL (ref 0.0–0.7)
EOS PCT: 2 %
HCT: 32.6 % — ABNORMAL LOW (ref 39.0–52.0)
Hemoglobin: 10.6 g/dL — ABNORMAL LOW (ref 13.0–17.0)
Immature Granulocytes: 0 %
Lymphocytes Relative: 22 %
Lymphs Abs: 1 10*3/uL (ref 0.7–4.0)
MCH: 30 pg (ref 26.0–34.0)
MCHC: 32.5 g/dL (ref 30.0–36.0)
MCV: 92.4 fL (ref 78.0–100.0)
MONO ABS: 0.4 10*3/uL (ref 0.1–1.0)
MONOS PCT: 9 %
Neutro Abs: 3.2 10*3/uL (ref 1.7–7.7)
Neutrophils Relative %: 67 %
PLATELETS: 164 10*3/uL (ref 150–400)
RBC: 3.53 MIL/uL — ABNORMAL LOW (ref 4.22–5.81)
RDW: 15 % (ref 11.5–15.5)
WBC: 4.8 10*3/uL (ref 4.0–10.5)

## 2018-04-21 LAB — TROPONIN I
TROPONIN I: 0.04 ng/mL — AB (ref <0.03)
TROPONIN I: 0.03 ng/mL — AB (ref <0.03)
Troponin I: <0.03 ng/mL (ref <0.03)
Troponin I: <0.03 ng/mL (ref <0.03)

## 2018-04-21 LAB — HEMOGLOBIN A1C
Hgb A1c MFr Bld: 7.3 % — ABNORMAL HIGH (ref 4.8–5.6)
Mean Plasma Glucose: 162.81 mg/dL

## 2018-04-21 LAB — CORTISOL: Cortisol, Plasma: 4.7 ug/dL

## 2018-04-21 LAB — TSH: TSH: 1.303 u[IU]/mL (ref 0.350–4.500)

## 2018-04-21 MED ORDER — ONDANSETRON HCL 4 MG PO TABS: 4.0000 mg | ORAL_TABLET | Freq: Four times a day (QID) | ORAL | Status: DC | PRN

## 2018-04-21 MED ORDER — ENOXAPARIN SODIUM 40 MG/0.4ML ~~LOC~~ SOLN
40.0000 mg | Freq: Every day | SUBCUTANEOUS | Status: DC
  Administered 2018-04-21 – 2018-04-22 (×2): 40 mg via SUBCUTANEOUS
  Filled 2018-04-21 (×3): qty 0.4

## 2018-04-21 MED ORDER — FUROSEMIDE 40 MG PO TABS
40.0000 mg | ORAL_TABLET | Freq: Every day | ORAL | Status: DC
  Administered 2018-04-21 – 2018-04-22 (×2): 40 mg via ORAL
  Filled 2018-04-21: qty 1
  Filled 2018-04-21: qty 2

## 2018-04-21 MED ORDER — SPIRONOLACTONE 25 MG PO TABS
50.0000 mg | ORAL_TABLET | Freq: Every day | ORAL | Status: DC
  Administered 2018-04-21 – 2018-04-22 (×2): 50 mg via ORAL
  Filled 2018-04-21 (×3): qty 2

## 2018-04-21 MED ORDER — ALBUTEROL SULFATE (2.5 MG/3ML) 0.083% IN NEBU: 2.5000 mg | INHALATION_SOLUTION | Freq: Four times a day (QID) | RESPIRATORY_TRACT | Status: DC | PRN

## 2018-04-21 MED ORDER — ASPIRIN EC 81 MG PO TBEC
81.0000 mg | DELAYED_RELEASE_TABLET | Freq: Every day | ORAL | Status: DC
  Administered 2018-04-21 – 2018-04-22 (×2): 81 mg via ORAL
  Filled 2018-04-21 (×2): qty 1

## 2018-04-21 MED ORDER — DEXTROSE-NACL 5-0.9 % IV SOLN
INTRAVENOUS | Status: DC
  Administered 2018-04-21: 04:00:00 via INTRAVENOUS

## 2018-04-21 MED ORDER — MAGNESIUM OXIDE 400 (241.3 MG) MG PO TABS
400.0000 mg | ORAL_TABLET | Freq: Two times a day (BID) | ORAL | Status: DC
  Administered 2018-04-21 – 2018-04-22 (×4): 400 mg via ORAL
  Filled 2018-04-21 (×4): qty 1

## 2018-04-21 MED ORDER — ATORVASTATIN CALCIUM 80 MG PO TABS
80.0000 mg | ORAL_TABLET | Freq: Every day | ORAL | Status: DC
  Administered 2018-04-21 (×2): 80 mg via ORAL
  Filled 2018-04-21: qty 4
  Filled 2018-04-21: qty 1

## 2018-04-21 MED ORDER — ACETAMINOPHEN 650 MG RE SUPP: 650.0000 mg | Freq: Four times a day (QID) | RECTAL | Status: DC | PRN

## 2018-04-21 MED ORDER — ONDANSETRON HCL 4 MG/2ML IJ SOLN
4.0000 mg | Freq: Four times a day (QID) | INTRAMUSCULAR | Status: DC | PRN
  Administered 2018-04-22: 4 mg via INTRAVENOUS
  Filled 2018-04-21: qty 2

## 2018-04-21 MED ORDER — ALBUTEROL SULFATE HFA 108 (90 BASE) MCG/ACT IN AERS: 1.0000 | INHALATION_SPRAY | Freq: Four times a day (QID) | RESPIRATORY_TRACT | Status: DC | PRN

## 2018-04-21 MED ORDER — AMIODARONE HCL 200 MG PO TABS
100.0000 mg | ORAL_TABLET | Freq: Every day | ORAL | Status: DC
  Administered 2018-04-21 – 2018-04-22 (×2): 100 mg via ORAL
  Filled 2018-04-21 (×2): qty 1

## 2018-04-21 MED ORDER — LISINOPRIL 20 MG PO TABS
20.0000 mg | ORAL_TABLET | Freq: Every day | ORAL | Status: DC
  Administered 2018-04-21 – 2018-04-22 (×2): 20 mg via ORAL
  Filled 2018-04-21 (×2): qty 1

## 2018-04-21 MED ORDER — ACETAMINOPHEN 325 MG PO TABS
650.0000 mg | ORAL_TABLET | Freq: Four times a day (QID) | ORAL | Status: DC | PRN
  Administered 2018-04-21: 650 mg via ORAL
  Filled 2018-04-21: qty 2

## 2018-04-21 MED ORDER — POTASSIUM CHLORIDE CRYS ER 20 MEQ PO TBCR
40.0000 meq | EXTENDED_RELEASE_TABLET | Freq: Once | ORAL | Status: AC
  Administered 2018-04-21: 40 meq via ORAL
  Filled 2018-04-21: qty 2

## 2018-04-21 NOTE — Progress Notes (Signed)
Kevin [name] [id number]  Code Status: FULL  Admission Data: 04/21/2018 6:00 PM  Attending Provider: Hal Hope  FTD:DUKGUR, Kevin Cowden, MD  Consults/ Treatment Team:   Miller Sabastian Raimondi is a 77 y.o. male patient admitted from ED awake, alert - oriented X 4 - no acute distress noted. VSS - Blood pressure 134/60, pulse 85, temperature 98.3 F (36.8 C), temperature source Oral, resp. rate 16, height 5\' 6"  (1.676 m), weight 83.4 kg (183 lb 13.8 oz), SpO2 91 %. no c/o shortness of breath, no c/o chest pain. Orientation to room, and floor completed with information packet given to patient/family. Admission INP armband ID verified with patient/family, and in place.  SR up x 2, fall assessment complete, with patient and family able to verbalize understanding of risk associated with falls, and verbalized understanding to call nsg before up out of bed. Call light within reach, patient able to voice, and demonstrate understanding. Skin, clean-dry- intact without evidence of bruising, or skin tears.  No evidence of skin break down noted on exam.  ?  Will cont to eval and treat per MD orders.  Melonie Florida, RN  04/21/2018 6:00 PM

## 2018-04-21 NOTE — Progress Notes (Signed)
PROGRESS NOTE  Brief Narrative: Kevin [name] is a 77 y.o. male with a history of NIDT2DM, HTN, melanoma undergoing chemotherapy, and chronic combined CHF s/p ICD who was brought to the ED by EMS due to decreased responsiveness after finding CBG to be low. He takes medications as directed (layed out in pill container by his wife) without any recent changes in meds or diet, has had this happen before about 1 month ago. Glucose in ED remained < 100 despite eating a full diet so was placed in observation this morning by Dr. Hal Hope.   Subjective: Eating fine. No N/V/D, abd pain, chest pain, dyspnea.   Objective: BP 124/78   Pulse 93   Temp 97.9 F (36.6 C) (Oral)   Resp (!) 21   Ht 5\' 7"  (1.702 m)   Wt 81.6 kg (180 lb)   SpO2 95%   BMI 28.19 kg/m   Gen: Elderly, well-appearing Pulm: Clear and nonlabored on room air  CV: RRR, no murmur, no JVD, no edema GI: Soft, NT, ND, +BS  Neuro: Alert and oriented. No focal deficits.  Assessment & Plan: Hypoglycemia in NIDT2DM: HbA1c 7.3% indicating possibly over-tight control if pt is becoming hypoglycemic. Plasma cortisol checked at 2:40am, was 4.7, not felt to be very diagnostic with that collection time. TSH 1.303.  - Hold metformin, [facility], actos, januvia. Would consider discontinuing [facility].  - Continue q1h CBG checks. Prefer to give po vs. IVF's with dextrose w/hx chronic CHF.   Chronic combined CHF: Compensated. Troponins 0.03, 0.04 without chest pain or dyspnea.  - Continue lasix 40mg , spironolactone 50mg , lisinopril, amiodarone, ASA, statin  Hypokalemia: Possibly related to hypoglycemia.  - Replace and recheck in AM. Continue home Mg.   Patrecia Pour, MD 04/21/2018, 1:41 PM

## 2018-04-21 NOTE — ED Notes (Signed)
CBG 73 RN Mario notified.

## 2018-04-21 NOTE — ED Notes (Signed)
Carb modified lunch tray ordered 

## 2018-04-21 NOTE — ED Notes (Signed)
CBG 69. Giving pt sandwich meal and juice to stave off further hypoglycemia.

## 2018-04-21 NOTE — ED Notes (Signed)
Updated daughter on the phone of pt.'s plan of care

## 2018-04-21 NOTE — H&P (Signed)
History and Physical    Kevin [name] WUX:[id number] DOB: 05/02/1941 DOA: 04/20/2018  PCP: Martinique, Sarah T, MD  Patient coming from: Home.  Chief Complaint: Low blood sugar.  HPI: Kevin [name] is a 77 y.o. male with history of diabetes mellitus type 2, hypertension, cardiomyopathy status post ICD placement was found to be less responsive by patient's family and EMS was called.  EMS on arrival found patient blood sugar to be around 29.  D50 was given following which blood sugar improved.  As per the patient patient has been feeling well last 24 hours.  Denies any chest pain shortness of breath nausea vomiting or diarrhea.  Patient states he has been eating well.  He has not had any hypoglycemic episodes previously.  But last 24 hours he has noted that his blood sugar has been running low.  He is on multiple oral hypoglycemics.  ED Course: In the ER despite eating hamburger patient's blood sugar is still around 83.  Patient appears nonfocal.  Patient admitted for further observation.  Patient states he has been recently getting chemotherapy for melanoma and last one was last month.  Review of Systems: As per HPI, rest all negative.   Past Medical History:  Diagnosis Date  . Cancer ([place])   . CHF (congestive heart failure) ([place])   . Diabetes mellitus without complication ([place])   . Hypertension     Past Surgical History:  Procedure Laterality Date  . APPENDECTOMY    . CARDIAC CATHETERIZATION    . CORONARY ANGIOPLASTY    . PACEMAKER INSERTION       reports that he has been smoking cigarettes.  He has a 60.00 pack-year smoking history. He has never used smokeless tobacco. He reports that he drank alcohol. He reports that he has current or past drug history.  No Known Allergies  Family History  Problem Relation Age of Onset  . Diabetes Mellitus II Father     Prior to Admission medications   Medication Sig Start Date End Date Taking? Authorizing Provider  albuterol  (PROVENTIL HFA;VENTOLIN HFA) 108 (90 Base) MCG/ACT inhaler Inhale 1-2 puffs into the lungs every 6 (six) hours as needed for wheezing or shortness of breath.   Yes [provider]  albuterol (PROVENTIL) (2.5 MG/3ML) 0.083% nebulizer solution Take 2.5 mg by nebulization every 6 (six) hours as needed for wheezing or shortness of breath.   Yes [provider]  amiodarone (PACERONE) 200 MG tablet Take 100 mg by mouth daily.   Yes [provider]  aspirin EC 81 MG tablet Take 81 mg by mouth daily.   Yes [provider]  atorvastatin (LIPITOR) 80 MG tablet Take 80 mg by mouth at bedtime.   Yes [provider]  furosemide (LASIX) 40 MG tablet Take 40 mg by mouth daily.   Yes [provider]  glimepiride (AMARYL) 4 MG tablet Take 4 mg by mouth 2 (two) times daily.   Yes [provider]  lisinopril (PRINIVIL,ZESTRIL) 20 MG tablet Take 20 mg by mouth daily.   Yes [provider]  magnesium oxide (MAG-OX) 400 (241.3 Mg) MG tablet Take 400 mg by mouth 2 (two) times daily.   Yes [provider]  metFORMIN (GLUCOPHAGE) 500 MG tablet Take 1,000 mg by mouth 2 (two) times daily with a meal.   Yes [provider]  pioglitazone (ACTOS) 30 MG tablet Take 30 mg by mouth daily.   Yes [provider]  sitaGLIPtin (JANUVIA) 100  MG tablet Take 100 mg by mouth daily.   Yes [provider]  spironolactone (ALDACTONE) 50 MG tablet Take 50 mg by mouth daily.   Yes [provider]  Vitamin D, Ergocalciferol, (DRISDOL) 50000 units CAPS capsule Take 50,000 Units by mouth every 7 (seven) days.   Yes [provider]    Physical Exam: Vitals:   04/21/18 0000 04/21/18 0015 04/21/18 0030 04/21/18 0045  BP: (!) 110/56 (!) 113/57 (!) 112/54 (!) 100/57  Pulse:      Resp: 13 16 14 13   Temp:      TempSrc:      SpO2:      Weight:      Height:          Constitutional: Moderately built and  nourished. Vitals:   04/21/18 0000 04/21/18 0015 04/21/18 0030 04/21/18 0045  BP: (!) 110/56 (!) 113/57 (!) 112/54 (!) 100/57  Pulse:      Resp: 13 16 14 13   Temp:      TempSrc:      SpO2:      Weight:      Height:       Eyes: Anicteric no pallor. ENMT: No discharge from the ears eyes nose or mouth. Neck: No mass or.  No JVD appreciated. Respiratory: No rhonchi or crepitations. Cardiovascular: S1-S2 heard no murmurs appreciated. Abdomen: Soft nontender bowel sounds present. Musculoskeletal: No edema.  No joint effusion. Skin: No rash. Neurologic: Alert awake oriented to time place and person.  Moves all extremities. Psychiatric: Appears normal per normal affect.   Labs on Admission: I have personally reviewed following labs and imaging studies  CBC: Recent Labs  Lab 04/20/18 2100  WBC 6.1  HGB 10.9*  HCT 34.0*  MCV 93.4  PLT 462   Basic Metabolic Panel: Recent Labs  Lab 04/20/18 2100  NA 140  K 3.4*  CL 101  CO2 29  GLUCOSE 153*  BUN 12  CREATININE 1.15  CALCIUM 9.2   GFR: Estimated Creatinine Clearance: 56.7 mL/min (by C-G formula based on SCr of 1.15 mg/dL). Liver Function Tests: Recent Labs  Lab 04/20/18 2100  AST 15  ALT 10*  ALKPHOS 54  BILITOT 0.7  PROT 6.3*  ALBUMIN 3.7   No results for input(s): LIPASE, AMYLASE in the last 168 hours. No results for input(s): AMMONIA in the last 168 hours. Coagulation Profile: No results for input(s): INR, PROTIME in the last 168 hours. Cardiac Enzymes: No results for input(s): CKTOTAL, CKMB, CKMBINDEX, TROPONINI in the last 168 hours. BNP (last 3 results) No results for input(s): PROBNP in the last 8760 hours. HbA1C: No results for input(s): HGBA1C in the last 72 hours. CBG: Recent Labs  Lab 04/20/18 2054 04/20/18 2328  GLUCAP 144* 83   Lipid Profile: No results for input(s): CHOL, HDL, LDLCALC, TRIG, CHOLHDL, LDLDIRECT in the last 72 hours. Thyroid Function Tests: No results for input(s): TSH,  T4TOTAL, FREET4, T3FREE, THYROIDAB in the last 72 hours. Anemia Panel: No results for input(s): VITAMINB12, FOLATE, FERRITIN, TIBC, IRON, RETICCTPCT in the last 72 hours. Urine analysis: No results found for: COLORURINE, APPEARANCEUR, LABSPEC, PHURINE, GLUCOSEU, HGBUR, BILIRUBINUR, KETONESUR, PROTEINUR, UROBILINOGEN, NITRITE, LEUKOCYTESUR Sepsis Labs: @LABRCNTIP (procalcitonin:4,lacticidven:4) )No results found for this or any previous visit (from the past 240 hour(s)).   Radiological Exams on Admission: No results found.  EKG: Independently reviewed.  Sinus rhythm with premature complexes.  Assessment/Plan Principal Problem:   Hypoglycemia Active Problems:   Chronic combined systolic and diastolic CHF (congestive heart  failure) ([place])   Essential hypertension   Diabetes mellitus type 2 in nonobese ([place])    1. Hypoglycemia -cause not clear.  Will hold oral hypoglycemics for now.  Will check hemoglobin A1c C-peptide levels cortisol levels.  Check CBGs every hourly.  If has more recurrent episodes of hypoglycemia will start patient on D5 normal saline. 2. Chronic combined systolic and diastolic CHF appears compensated.  Continue Lasix Aldactone and lisinopril. 3. Hyperlipidemia on statins. 4. Diabetes mellitus type to see #1. 5. History of skin cancer is being treated with chemotherapy.   DVT prophylaxis: Lovenox. Code Status: Full code. Family Communication: Discussed with patient. Disposition Plan: Home. Consults called: None. Admission status: Observation.   Rise Patience MD [facility] Pager [phone].  If 7PM-7AM, please contact night-coverage [email or web address] Password [email or web address]  04/21/2018, 1:33 AM

## 2018-04-21 NOTE — Progress Notes (Signed)
Pt's CBG is ordered as q1hr. CBGs are currently persisting in the 300's without insulin  Coverage. NP on call was notified to clarify the plan of care. Will continue to monitor.

## 2018-04-21 NOTE — Progress Notes (Signed)
CBGs 262, then 256 the following hour. No orders for insulin at this time. MD Hal Hope notified and awaiting response. Will continue to monitor.

## 2018-04-21 NOTE — ED Notes (Signed)
Pt. Requested orange juice.

## 2018-04-22 DIAGNOSIS — E162 Hypoglycemia, unspecified: Secondary | ICD-10-CM | POA: Diagnosis not present

## 2018-04-22 LAB — BASIC METABOLIC PANEL
Anion gap: 10 (ref 5–15)
BUN: 18 mg/dL (ref 6–20)
CHLORIDE: 104 mmol/L (ref 101–111)
CO2: 30 mmol/L (ref 22–32)
Calcium: 8.9 mg/dL (ref 8.9–10.3)
Creatinine, Ser: 1.12 mg/dL (ref 0.61–1.24)
GFR calc non Af Amer: >60 mL/min (ref >60)
Glucose, Bld: 233 mg/dL — ABNORMAL HIGH (ref 65–99)
POTASSIUM: 3.6 mmol/L (ref 3.5–5.1)
SODIUM: 144 mmol/L (ref 135–145)

## 2018-04-22 LAB — GLUCOSE, CAPILLARY
GLUCOSE-CAPILLARY: 320 mg/dL — AB (ref 65–99)
GLUCOSE-CAPILLARY: 252 mg/dL — AB (ref 65–99)
GLUCOSE-CAPILLARY: 218 mg/dL — AB (ref 65–99)
GLUCOSE-CAPILLARY: 195 mg/dL — AB (ref 65–99)

## 2018-04-22 LAB — C-PEPTIDE: C PEPTIDE: 5.1 ng/mL — AB (ref 1.1–4.4)

## 2018-04-22 MED ORDER — METFORMIN HCL 500 MG PO TABS
1000.0000 mg | ORAL_TABLET | Freq: Two times a day (BID) | ORAL | Status: DC
  Administered 2018-04-22: 1000 mg via ORAL
  Filled 2018-04-22: qty 2

## 2018-04-22 MED ORDER — LINAGLIPTIN 5 MG PO TABS
5.0000 mg | ORAL_TABLET | Freq: Every day | ORAL | Status: DC
  Administered 2018-04-22: 5 mg via ORAL
  Filled 2018-04-22: qty 1

## 2018-04-22 MED ORDER — PIOGLITAZONE HCL 30 MG PO TABS
30.0000 mg | ORAL_TABLET | Freq: Every day | ORAL | Status: DC
  Administered 2018-04-22: 30 mg via ORAL
  Filled 2018-04-22 (×2): qty 1

## 2018-04-22 MED ORDER — INSULIN ASPART 100 UNIT/ML ~~LOC~~ SOLN: 0.0000 [IU] | Freq: Every day | SUBCUTANEOUS | Status: DC

## 2018-04-22 MED ORDER — INSULIN ASPART 100 UNIT/ML ~~LOC~~ SOLN
0.0000 [IU] | Freq: Three times a day (TID) | SUBCUTANEOUS | Status: DC
  Administered 2018-04-22: 2 [IU] via SUBCUTANEOUS
  Administered 2018-04-22: 3 [IU] via SUBCUTANEOUS

## 2018-04-22 NOTE — Discharge Summary (Signed)
Physician Discharge Summary  Kevin [name] [id number] DOB: 12-May-1941 DOA: 04/20/2018  PCP: [place], Sarah T, MD  Admit date: 04/20/2018 Discharge date: 04/22/2018  Admitted From: Home Disposition: Home   Recommendations for Outpatient Follow-up:  1. Follow up with PCP in 1-2 weeks for ongoing diabetes management. Discontinued amaryl due to hypoglycemia.  Home Health: None Equipment/Devices: None Discharge Condition: Stable CODE STATUS: Full Diet recommendation: Carb-modified  Brief/Interim Summary: Kevin [name] is a 77 y.o. male with a history of NIDT2DM, HTN, melanoma undergoing chemotherapy, and chronic combined CHF s/p ICD who was brought to the ED by EMS due to decreased responsiveness after finding CBG to be low. He takes medications as directed (layed out in pill container by his wife) without any recent changes in meds or diet, has had this happen before about 1 month ago. Glucose in ED remained < 100 despite eating a full diet so was placed in observation while holding hypoglycemic agents. Blood sugar rose steadily and actually peaked >300 after he ate [employer] last night. His medications will be restarted with the exception of glimepiride and he will need close PCP follow up. He is discharged in stable condition.  Discharge Diagnoses:  Principal Problem:   Hypoglycemia Active Problems:   Chronic combined systolic and diastolic CHF (congestive heart failure) ([facility])   Essential hypertension   Diabetes mellitus type 2 in nonobese ([facility])  Hypoglycemia in NIDT2DM: HbA1c 7.3% indicating possibly over-tight control if pt is becoming hypoglycemic. Plasma cortisol checked at 2:40am, was 4.7, not felt to be very diagnostic with that collection time. TSH 1.303.  - Hold [facility], restart metformin, actos, januvia.  - PCP follow up emphasized and plan for hypoglycemia reviewed. - Asked to check CBGs and record values for PCP follow up  Chronic combined CHF: Compensated. Troponins 0.03,  0.04 without chest pain or dyspnea.  - Continue lasix 40mg , spironolactone 50mg , lisinopril, amiodarone, ASA, statin  Hypokalemia: Possibly related to hypoglycemia.  - Replace and recheck in AM. Continue home Mg.   Discharge Instructions Discharge Instructions    Diet Carb Modified   Complete by:  As directed    Discharge instructions   Complete by:  As directed    You were admitted with hypoglycemia, low blood sugar, which is dangerous. This has improved when we stopped your diabetes medications and now your blood sugars are elevated. You will need to follow up with your primary doctor soon (within the next 1-2 weeks) to recheck your diabetes medications. In the meantime, restart all medications you were on EXCEPT for glimepiride (amaryl). Do not take this medication. Check your blood sugars every morning and with every meal and record the values to take to Dr. Martinique. If your symptoms return, seek medical attention right away and if your blood sugar is below 60, eat a juice and crackers snack and recheck in 30 minutes. If still low, seek medical attention right away.   Increase activity slowly   Complete by:  As directed      Allergies as of 04/22/2018   No Known Allergies     Medication List    STOP taking these medications   glimepiride 4 MG tablet Commonly known as:  AMARYL     TAKE these medications   albuterol (2.5 MG/3ML) 0.083% nebulizer solution Commonly known as:  PROVENTIL Take 2.5 mg by nebulization every 6 (six) hours as needed for wheezing or shortness of breath.   albuterol 108 (90 Base) MCG/ACT inhaler Commonly known as:  PROVENTIL HFA;VENTOLIN  HFA Inhale 1-2 puffs into the lungs every 6 (six) hours as needed for wheezing or shortness of breath.   amiodarone 200 MG tablet Commonly known as:  PACERONE Take 100 mg by mouth daily.   aspirin EC 81 MG tablet Take 81 mg by mouth daily.   atorvastatin 80 MG tablet Commonly known as:  LIPITOR Take 80 mg by mouth  at bedtime.   furosemide 40 MG tablet Commonly known as:  LASIX Take 40 mg by mouth daily.   lisinopril 20 MG tablet Commonly known as:  PRINIVIL,ZESTRIL Take 20 mg by mouth daily.   magnesium oxide 400 (241.3 Mg) MG tablet Commonly known as:  MAG-OX Take 400 mg by mouth 2 (two) times daily.   metFORMIN 500 MG tablet Commonly known as:  GLUCOPHAGE Take 1,000 mg by mouth 2 (two) times daily with a meal.   pioglitazone 30 MG tablet Commonly known as:  ACTOS Take 30 mg by mouth daily.   sitaGLIPtin 100 MG tablet Commonly known as:  JANUVIA Take 100 mg by mouth daily.   spironolactone 50 MG tablet Commonly known as:  ALDACTONE Take 50 mg by mouth daily.   Vitamin D (Ergocalciferol) 50000 units Caps capsule Commonly known as:  DRISDOL Take 50,000 Units by mouth every 7 (seven) days.      Follow-up Information    Martinique, Sarah T, MD. Schedule an appointment as soon as possible for a visit in 1 week(s).   Specialty:  Family Medicine Contact information: [place]. [place] [address] [phone]          No Known Allergies  Consultations:  None  Procedures/Studies:  No results found.  Subjective: Ate [employer] last night, having no problems. Denies chest pain, dyspnea, dizziness, weakness, vision changes, changes in urination.   Discharge Exam: Vitals:   04/22/18 0006 04/22/18 0433  BP: 130/63   Pulse: 64   Resp: 15   Temp: 98.5 F (36.9 C) 97.8 F (36.6 C)  SpO2: 91%    General: Pt is alert, awake, not in acute distress Cardiovascular: RRR, S1/S2 +, no rubs, no gallops Respiratory: CTA bilaterally, no wheezing, no rhonchi Abdominal: Soft, NT, ND, bowel sounds + Extremities: No edema, no cyanosis  Labs: BNP (last 3 results) No results for input(s): BNP in the last 8760 hours. Basic Metabolic Panel: Recent Labs  Lab 04/20/18 2100 04/21/18 0240  NA 140 144  K 3.4* 3.0*  CL 101 104  CO2 29 29  GLUCOSE 153* 68  BUN 12 13   CREATININE 1.15 1.08  CALCIUM 9.2 9.0   Liver Function Tests: Recent Labs  Lab 04/20/18 2100  AST 15  ALT 10*  ALKPHOS 54  BILITOT 0.7  PROT 6.3*  ALBUMIN 3.7   No results for input(s): LIPASE, AMYLASE in the last 168 hours. No results for input(s): AMMONIA in the last 168 hours. CBC: Recent Labs  Lab 04/20/18 2100 04/21/18 0240  WBC 6.1 4.8  NEUTROABS  --  3.2  HGB 10.9* 10.6*  HCT 34.0* 32.6*  MCV 93.4 92.4  PLT 170 164   Cardiac Enzymes: Recent Labs  Lab 04/21/18 0240 04/21/18 0547 04/21/18 1440 04/21/18 1604  TROPONINI 0.04* 0.03* <0.03 <0.03   BNP: Invalid input(s): POCBNP CBG: Recent Labs  Lab 04/21/18 2052 04/21/18 2053 04/21/18 2301 04/22/18 0117 04/22/18 0432  GLUCAP 363* 356* 342* 320* 252*   D-Dimer No results for input(s): DDIMER in the last 72 hours. Hgb A1c Recent Labs    04/21/18  0240  HGBA1C 7.3*   Lipid Profile No results for input(s): CHOL, HDL, LDLCALC, TRIG, CHOLHDL, LDLDIRECT in the last 72 hours. Thyroid function studies Recent Labs    04/21/18 0240  TSH 1.303   Anemia work up No results for input(s): VITAMINB12, FOLATE, FERRITIN, TIBC, IRON, RETICCTPCT in the last 72 hours. Urinalysis No results found for: COLORURINE, APPEARANCEUR, LABSPEC, [place], GLUCOSEU, HGBUR, BILIRUBINUR, KETONESUR, PROTEINUR, UROBILINOGEN, NITRITE, [place]  Microbiology No results found for this or any previous visit (from the past 240 hour(s)).  Time coordinating discharge: Approximately 40 minutes  Patrecia Pour, MD  [facility] 04/22/2018, 7:33 AM Pager [phone]

## 2018-04-22 NOTE — Care Management Obs Status (Signed)
[facility] NOTIFICATION   Patient Details  Name: Jahrel Virlan Kempker MRN: [id number] Date of Birth: 07-Sep-1941   Medicare Observation Status Notification Given:  Yes    Sharin Mons, RN 04/22/2018, 10:26 AM

## 2018-06-17 DIAGNOSIS — L270 Generalized skin eruption due to drugs and medicaments taken internally: Secondary | ICD-10-CM | POA: Diagnosis not present

## 2018-06-17 DIAGNOSIS — E538 Deficiency of other specified B group vitamins: Secondary | ICD-10-CM

## 2018-06-17 DIAGNOSIS — N189 Chronic kidney disease, unspecified: Secondary | ICD-10-CM

## 2018-06-17 DIAGNOSIS — Z85820 Personal history of malignant melanoma of skin: Secondary | ICD-10-CM

## 2018-06-17 DIAGNOSIS — I509 Heart failure, unspecified: Secondary | ICD-10-CM

## 2018-06-17 DIAGNOSIS — Z950 Presence of cardiac pacemaker: Secondary | ICD-10-CM

## 2018-06-17 DIAGNOSIS — C4339 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: Secondary | ICD-10-CM | POA: Diagnosis not present

## 2018-08-19 DIAGNOSIS — D519 Vitamin B12 deficiency anemia, unspecified: Secondary | ICD-10-CM | POA: Insufficient documentation

## 2018-08-20 DIAGNOSIS — R21 Rash and other nonspecific skin eruption: Secondary | ICD-10-CM

## 2018-08-20 DIAGNOSIS — Z85820 Personal history of malignant melanoma of skin: Secondary | ICD-10-CM

## 2018-08-20 DIAGNOSIS — E538 Deficiency of other specified B group vitamins: Secondary | ICD-10-CM

## 2018-08-20 DIAGNOSIS — I509 Heart failure, unspecified: Secondary | ICD-10-CM

## 2018-08-20 DIAGNOSIS — C4339 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: Secondary | ICD-10-CM | POA: Diagnosis not present

## 2018-08-20 DIAGNOSIS — Z950 Presence of cardiac pacemaker: Secondary | ICD-10-CM

## 2018-08-20 DIAGNOSIS — N189 Chronic kidney disease, unspecified: Secondary | ICD-10-CM

## 2018-09-15 DIAGNOSIS — C4339 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: Secondary | ICD-10-CM | POA: Diagnosis not present

## 2018-09-15 DIAGNOSIS — C439 Malignant melanoma of skin, unspecified: Secondary | ICD-10-CM | POA: Diagnosis not present

## 2018-09-17 DIAGNOSIS — I509 Heart failure, unspecified: Secondary | ICD-10-CM | POA: Diagnosis not present

## 2018-09-17 DIAGNOSIS — R21 Rash and other nonspecific skin eruption: Secondary | ICD-10-CM | POA: Diagnosis not present

## 2018-09-17 DIAGNOSIS — Z23 Encounter for immunization: Secondary | ICD-10-CM | POA: Diagnosis not present

## 2018-09-17 DIAGNOSIS — N189 Chronic kidney disease, unspecified: Secondary | ICD-10-CM | POA: Diagnosis not present

## 2018-09-17 DIAGNOSIS — E538 Deficiency of other specified B group vitamins: Secondary | ICD-10-CM | POA: Diagnosis not present

## 2018-09-17 DIAGNOSIS — Z950 Presence of cardiac pacemaker: Secondary | ICD-10-CM | POA: Diagnosis not present

## 2018-09-17 DIAGNOSIS — C4339 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: Secondary | ICD-10-CM | POA: Diagnosis not present

## 2018-09-17 DIAGNOSIS — L270 Generalized skin eruption due to drugs and medicaments taken internally: Secondary | ICD-10-CM | POA: Diagnosis not present

## 2018-10-14 DIAGNOSIS — I5040 Unspecified combined systolic (congestive) and diastolic (congestive) heart failure: Secondary | ICD-10-CM | POA: Diagnosis not present

## 2018-10-14 DIAGNOSIS — R110 Nausea: Secondary | ICD-10-CM | POA: Diagnosis not present

## 2018-10-14 DIAGNOSIS — C4339 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: Secondary | ICD-10-CM | POA: Diagnosis not present

## 2018-10-14 DIAGNOSIS — I471 Supraventricular tachycardia: Secondary | ICD-10-CM | POA: Diagnosis not present

## 2018-10-14 DIAGNOSIS — I2510 Atherosclerotic heart disease of native coronary artery without angina pectoris: Secondary | ICD-10-CM | POA: Diagnosis not present

## 2018-10-14 DIAGNOSIS — I10 Essential (primary) hypertension: Secondary | ICD-10-CM | POA: Diagnosis not present

## 2018-10-14 DIAGNOSIS — Z1331 Encounter for screening for depression: Secondary | ICD-10-CM | POA: Diagnosis not present

## 2018-10-14 DIAGNOSIS — E118 Type 2 diabetes mellitus with unspecified complications: Secondary | ICD-10-CM | POA: Diagnosis not present

## 2018-10-15 DIAGNOSIS — N189 Chronic kidney disease, unspecified: Secondary | ICD-10-CM | POA: Diagnosis not present

## 2018-10-15 DIAGNOSIS — C770 Secondary and unspecified malignant neoplasm of lymph nodes of head, face and neck: Secondary | ICD-10-CM | POA: Diagnosis not present

## 2018-10-15 DIAGNOSIS — C4339 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: Secondary | ICD-10-CM | POA: Diagnosis not present

## 2018-10-15 DIAGNOSIS — Z950 Presence of cardiac pacemaker: Secondary | ICD-10-CM | POA: Diagnosis not present

## 2018-10-15 DIAGNOSIS — D649 Anemia, unspecified: Secondary | ICD-10-CM | POA: Diagnosis not present

## 2018-10-15 DIAGNOSIS — E538 Deficiency of other specified B group vitamins: Secondary | ICD-10-CM | POA: Diagnosis not present

## 2018-10-15 DIAGNOSIS — I509 Heart failure, unspecified: Secondary | ICD-10-CM | POA: Diagnosis not present

## 2018-11-12 DIAGNOSIS — I959 Hypotension, unspecified: Secondary | ICD-10-CM | POA: Diagnosis not present

## 2018-11-12 DIAGNOSIS — D649 Anemia, unspecified: Secondary | ICD-10-CM | POA: Diagnosis not present

## 2018-11-12 DIAGNOSIS — Z950 Presence of cardiac pacemaker: Secondary | ICD-10-CM | POA: Diagnosis not present

## 2018-11-12 DIAGNOSIS — N19 Unspecified kidney failure: Secondary | ICD-10-CM | POA: Diagnosis not present

## 2018-11-12 DIAGNOSIS — E538 Deficiency of other specified B group vitamins: Secondary | ICD-10-CM | POA: Diagnosis not present

## 2018-11-12 DIAGNOSIS — Z79899 Other long term (current) drug therapy: Secondary | ICD-10-CM | POA: Diagnosis not present

## 2018-11-12 DIAGNOSIS — I509 Heart failure, unspecified: Secondary | ICD-10-CM | POA: Diagnosis not present

## 2018-11-12 DIAGNOSIS — C4339 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: Secondary | ICD-10-CM | POA: Diagnosis not present

## 2018-12-07 DIAGNOSIS — J449 Chronic obstructive pulmonary disease, unspecified: Secondary | ICD-10-CM | POA: Diagnosis not present

## 2018-12-07 DIAGNOSIS — I509 Heart failure, unspecified: Secondary | ICD-10-CM | POA: Diagnosis not present

## 2018-12-07 DIAGNOSIS — Z95810 Presence of automatic (implantable) cardiac defibrillator: Secondary | ICD-10-CM | POA: Diagnosis not present

## 2018-12-07 DIAGNOSIS — Z6827 Body mass index (BMI) 27.0-27.9, adult: Secondary | ICD-10-CM | POA: Diagnosis not present

## 2018-12-10 DIAGNOSIS — C4339 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: Secondary | ICD-10-CM | POA: Diagnosis not present

## 2018-12-10 DIAGNOSIS — D649 Anemia, unspecified: Secondary | ICD-10-CM | POA: Diagnosis not present

## 2018-12-10 DIAGNOSIS — E538 Deficiency of other specified B group vitamins: Secondary | ICD-10-CM | POA: Diagnosis not present

## 2018-12-24 DIAGNOSIS — C4339 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: Secondary | ICD-10-CM | POA: Diagnosis not present

## 2018-12-24 DIAGNOSIS — Z5111 Encounter for antineoplastic chemotherapy: Secondary | ICD-10-CM | POA: Diagnosis not present

## 2019-01-04 DIAGNOSIS — J449 Chronic obstructive pulmonary disease, unspecified: Secondary | ICD-10-CM | POA: Diagnosis not present

## 2019-01-07 DIAGNOSIS — C4339 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: Secondary | ICD-10-CM | POA: Diagnosis not present

## 2019-01-13 DIAGNOSIS — I5040 Unspecified combined systolic (congestive) and diastolic (congestive) heart failure: Secondary | ICD-10-CM | POA: Diagnosis not present

## 2019-01-13 DIAGNOSIS — C4339 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: Secondary | ICD-10-CM | POA: Diagnosis not present

## 2019-01-13 DIAGNOSIS — E118 Type 2 diabetes mellitus with unspecified complications: Secondary | ICD-10-CM | POA: Diagnosis not present

## 2019-01-13 DIAGNOSIS — I2510 Atherosclerotic heart disease of native coronary artery without angina pectoris: Secondary | ICD-10-CM | POA: Diagnosis not present

## 2019-01-13 DIAGNOSIS — Z6827 Body mass index (BMI) 27.0-27.9, adult: Secondary | ICD-10-CM | POA: Diagnosis not present

## 2019-01-13 DIAGNOSIS — I471 Supraventricular tachycardia: Secondary | ICD-10-CM | POA: Diagnosis not present

## 2019-01-13 DIAGNOSIS — I10 Essential (primary) hypertension: Secondary | ICD-10-CM | POA: Diagnosis not present

## 2019-01-20 DIAGNOSIS — I420 Dilated cardiomyopathy: Secondary | ICD-10-CM | POA: Diagnosis not present

## 2019-01-20 DIAGNOSIS — Z4502 Encounter for adjustment and management of automatic implantable cardiac defibrillator: Secondary | ICD-10-CM | POA: Diagnosis not present

## 2019-02-04 DIAGNOSIS — E876 Hypokalemia: Secondary | ICD-10-CM | POA: Diagnosis not present

## 2019-02-04 DIAGNOSIS — I509 Heart failure, unspecified: Secondary | ICD-10-CM | POA: Diagnosis not present

## 2019-02-04 DIAGNOSIS — F4320 Adjustment disorder, unspecified: Secondary | ICD-10-CM | POA: Diagnosis not present

## 2019-02-04 DIAGNOSIS — C4339 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: Secondary | ICD-10-CM | POA: Diagnosis not present

## 2019-02-04 DIAGNOSIS — Z950 Presence of cardiac pacemaker: Secondary | ICD-10-CM | POA: Diagnosis not present

## 2019-02-04 DIAGNOSIS — E538 Deficiency of other specified B group vitamins: Secondary | ICD-10-CM | POA: Diagnosis not present

## 2019-02-04 DIAGNOSIS — N189 Chronic kidney disease, unspecified: Secondary | ICD-10-CM | POA: Diagnosis not present

## 2019-03-04 DIAGNOSIS — Z79899 Other long term (current) drug therapy: Secondary | ICD-10-CM | POA: Diagnosis not present

## 2019-03-04 DIAGNOSIS — C4339 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: Secondary | ICD-10-CM | POA: Diagnosis not present

## 2019-03-08 DIAGNOSIS — Z95810 Presence of automatic (implantable) cardiac defibrillator: Secondary | ICD-10-CM | POA: Diagnosis not present

## 2019-04-01 DIAGNOSIS — I509 Heart failure, unspecified: Secondary | ICD-10-CM | POA: Diagnosis not present

## 2019-04-01 DIAGNOSIS — C4339 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: Secondary | ICD-10-CM | POA: Diagnosis not present

## 2019-04-01 DIAGNOSIS — D649 Anemia, unspecified: Secondary | ICD-10-CM | POA: Diagnosis not present

## 2019-04-01 DIAGNOSIS — N189 Chronic kidney disease, unspecified: Secondary | ICD-10-CM | POA: Diagnosis not present

## 2019-04-01 DIAGNOSIS — E538 Deficiency of other specified B group vitamins: Secondary | ICD-10-CM | POA: Diagnosis not present

## 2019-04-12 DIAGNOSIS — Z85820 Personal history of malignant melanoma of skin: Secondary | ICD-10-CM | POA: Diagnosis not present

## 2019-04-12 DIAGNOSIS — C4339 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: Secondary | ICD-10-CM | POA: Diagnosis not present

## 2019-04-12 DIAGNOSIS — N281 Cyst of kidney, acquired: Secondary | ICD-10-CM | POA: Diagnosis not present

## 2019-04-12 DIAGNOSIS — I313 Pericardial effusion (noninflammatory): Secondary | ICD-10-CM | POA: Diagnosis not present

## 2019-04-15 DIAGNOSIS — E118 Type 2 diabetes mellitus with unspecified complications: Secondary | ICD-10-CM | POA: Diagnosis not present

## 2019-04-15 DIAGNOSIS — I10 Essential (primary) hypertension: Secondary | ICD-10-CM | POA: Diagnosis not present

## 2019-04-15 DIAGNOSIS — Z139 Encounter for screening, unspecified: Secondary | ICD-10-CM | POA: Diagnosis not present

## 2019-04-15 DIAGNOSIS — I471 Supraventricular tachycardia: Secondary | ICD-10-CM | POA: Diagnosis not present

## 2019-04-15 DIAGNOSIS — C4339 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: Secondary | ICD-10-CM | POA: Diagnosis not present

## 2019-04-15 DIAGNOSIS — I5040 Unspecified combined systolic (congestive) and diastolic (congestive) heart failure: Secondary | ICD-10-CM | POA: Diagnosis not present

## 2019-04-15 DIAGNOSIS — E119 Type 2 diabetes mellitus without complications: Secondary | ICD-10-CM | POA: Diagnosis not present

## 2019-04-15 DIAGNOSIS — Z87891 Personal history of nicotine dependence: Secondary | ICD-10-CM | POA: Diagnosis not present

## 2019-04-15 DIAGNOSIS — I2510 Atherosclerotic heart disease of native coronary artery without angina pectoris: Secondary | ICD-10-CM | POA: Diagnosis not present

## 2019-04-29 DIAGNOSIS — D649 Anemia, unspecified: Secondary | ICD-10-CM | POA: Diagnosis not present

## 2019-04-29 DIAGNOSIS — C4339 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: Secondary | ICD-10-CM | POA: Diagnosis not present

## 2019-04-29 DIAGNOSIS — R21 Rash and other nonspecific skin eruption: Secondary | ICD-10-CM | POA: Diagnosis not present

## 2019-05-27 DIAGNOSIS — C4339 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: Secondary | ICD-10-CM | POA: Diagnosis not present

## 2019-05-27 DIAGNOSIS — Z5111 Encounter for antineoplastic chemotherapy: Secondary | ICD-10-CM | POA: Diagnosis not present

## 2019-05-27 DIAGNOSIS — Z79899 Other long term (current) drug therapy: Secondary | ICD-10-CM | POA: Diagnosis not present

## 2019-06-07 DIAGNOSIS — Z95810 Presence of automatic (implantable) cardiac defibrillator: Secondary | ICD-10-CM | POA: Diagnosis not present

## 2019-06-24 DIAGNOSIS — F4320 Adjustment disorder, unspecified: Secondary | ICD-10-CM | POA: Diagnosis not present

## 2019-06-24 DIAGNOSIS — E538 Deficiency of other specified B group vitamins: Secondary | ICD-10-CM | POA: Diagnosis not present

## 2019-06-24 DIAGNOSIS — C4339 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: Secondary | ICD-10-CM | POA: Diagnosis not present

## 2019-06-24 DIAGNOSIS — N189 Chronic kidney disease, unspecified: Secondary | ICD-10-CM | POA: Diagnosis not present

## 2019-06-24 DIAGNOSIS — D649 Anemia, unspecified: Secondary | ICD-10-CM | POA: Diagnosis not present

## 2019-07-19 DIAGNOSIS — Z6826 Body mass index (BMI) 26.0-26.9, adult: Secondary | ICD-10-CM | POA: Diagnosis not present

## 2019-07-19 DIAGNOSIS — E118 Type 2 diabetes mellitus with unspecified complications: Secondary | ICD-10-CM | POA: Diagnosis not present

## 2019-07-19 DIAGNOSIS — Z9181 History of falling: Secondary | ICD-10-CM | POA: Diagnosis not present

## 2019-07-22 DIAGNOSIS — C4339 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: Secondary | ICD-10-CM | POA: Diagnosis not present

## 2019-07-22 DIAGNOSIS — Z79899 Other long term (current) drug therapy: Secondary | ICD-10-CM | POA: Diagnosis not present

## 2019-08-18 DIAGNOSIS — I5040 Unspecified combined systolic (congestive) and diastolic (congestive) heart failure: Secondary | ICD-10-CM | POA: Diagnosis not present

## 2019-08-18 DIAGNOSIS — I10 Essential (primary) hypertension: Secondary | ICD-10-CM | POA: Diagnosis not present

## 2019-08-18 DIAGNOSIS — E119 Type 2 diabetes mellitus without complications: Secondary | ICD-10-CM | POA: Diagnosis not present

## 2019-08-18 DIAGNOSIS — Z6827 Body mass index (BMI) 27.0-27.9, adult: Secondary | ICD-10-CM | POA: Diagnosis not present

## 2019-08-19 DIAGNOSIS — Z23 Encounter for immunization: Secondary | ICD-10-CM | POA: Diagnosis not present

## 2019-08-19 DIAGNOSIS — Z79899 Other long term (current) drug therapy: Secondary | ICD-10-CM | POA: Diagnosis not present

## 2019-08-19 DIAGNOSIS — C4339 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: Secondary | ICD-10-CM | POA: Diagnosis not present

## 2019-09-06 DIAGNOSIS — Z95810 Presence of automatic (implantable) cardiac defibrillator: Secondary | ICD-10-CM | POA: Diagnosis not present

## 2019-09-21 DIAGNOSIS — C439 Malignant melanoma of skin, unspecified: Secondary | ICD-10-CM | POA: Diagnosis not present

## 2019-09-21 DIAGNOSIS — C4330 Malignant melanoma of unspecified part of face: Secondary | ICD-10-CM | POA: Diagnosis not present

## 2019-09-21 DIAGNOSIS — K8020 Calculus of gallbladder without cholecystitis without obstruction: Secondary | ICD-10-CM | POA: Diagnosis not present

## 2019-09-21 DIAGNOSIS — C4339 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: Secondary | ICD-10-CM | POA: Diagnosis not present

## 2019-09-21 DIAGNOSIS — D649 Anemia, unspecified: Secondary | ICD-10-CM | POA: Diagnosis not present

## 2019-09-23 DIAGNOSIS — C4339 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: Secondary | ICD-10-CM | POA: Diagnosis not present

## 2019-10-18 DIAGNOSIS — C4339 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: Secondary | ICD-10-CM | POA: Diagnosis not present

## 2019-10-18 DIAGNOSIS — I5040 Unspecified combined systolic (congestive) and diastolic (congestive) heart failure: Secondary | ICD-10-CM | POA: Diagnosis not present

## 2019-10-18 DIAGNOSIS — I471 Supraventricular tachycardia: Secondary | ICD-10-CM | POA: Diagnosis not present

## 2019-10-18 DIAGNOSIS — Z6827 Body mass index (BMI) 27.0-27.9, adult: Secondary | ICD-10-CM | POA: Diagnosis not present

## 2019-10-18 DIAGNOSIS — E118 Type 2 diabetes mellitus with unspecified complications: Secondary | ICD-10-CM | POA: Diagnosis not present

## 2019-10-18 DIAGNOSIS — I2510 Atherosclerotic heart disease of native coronary artery without angina pectoris: Secondary | ICD-10-CM | POA: Diagnosis not present

## 2019-10-18 DIAGNOSIS — I10 Essential (primary) hypertension: Secondary | ICD-10-CM | POA: Diagnosis not present

## 2019-10-21 DIAGNOSIS — Z79899 Other long term (current) drug therapy: Secondary | ICD-10-CM | POA: Diagnosis not present

## 2019-10-21 DIAGNOSIS — C4339 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: Secondary | ICD-10-CM | POA: Diagnosis not present

## 2019-11-18 DIAGNOSIS — C4339 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: Secondary | ICD-10-CM | POA: Diagnosis not present

## 2019-11-18 DIAGNOSIS — Z79899 Other long term (current) drug therapy: Secondary | ICD-10-CM | POA: Diagnosis not present

## 2019-12-07 DIAGNOSIS — Z95810 Presence of automatic (implantable) cardiac defibrillator: Secondary | ICD-10-CM | POA: Diagnosis not present

## 2019-12-16 DIAGNOSIS — Z20822 Contact with and (suspected) exposure to covid-19: Secondary | ICD-10-CM | POA: Diagnosis not present

## 2019-12-16 DIAGNOSIS — C4339 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: Secondary | ICD-10-CM | POA: Diagnosis not present

## 2020-01-06 DIAGNOSIS — I509 Heart failure, unspecified: Secondary | ICD-10-CM | POA: Diagnosis not present

## 2020-01-06 DIAGNOSIS — Z950 Presence of cardiac pacemaker: Secondary | ICD-10-CM | POA: Diagnosis not present

## 2020-01-06 DIAGNOSIS — E538 Deficiency of other specified B group vitamins: Secondary | ICD-10-CM | POA: Diagnosis not present

## 2020-01-06 DIAGNOSIS — D631 Anemia in chronic kidney disease: Secondary | ICD-10-CM | POA: Diagnosis not present

## 2020-01-06 DIAGNOSIS — C4339 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: Secondary | ICD-10-CM | POA: Diagnosis not present

## 2020-01-06 DIAGNOSIS — N189 Chronic kidney disease, unspecified: Secondary | ICD-10-CM | POA: Diagnosis not present

## 2020-01-07 DIAGNOSIS — C4339 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: Secondary | ICD-10-CM | POA: Diagnosis not present

## 2020-01-16 DIAGNOSIS — Z1331 Encounter for screening for depression: Secondary | ICD-10-CM | POA: Diagnosis not present

## 2020-01-16 DIAGNOSIS — Z6827 Body mass index (BMI) 27.0-27.9, adult: Secondary | ICD-10-CM | POA: Diagnosis not present

## 2020-01-16 DIAGNOSIS — I10 Essential (primary) hypertension: Secondary | ICD-10-CM | POA: Diagnosis not present

## 2020-01-16 DIAGNOSIS — I471 Supraventricular tachycardia: Secondary | ICD-10-CM | POA: Diagnosis not present

## 2020-01-16 DIAGNOSIS — I2510 Atherosclerotic heart disease of native coronary artery without angina pectoris: Secondary | ICD-10-CM | POA: Diagnosis not present

## 2020-01-16 DIAGNOSIS — C4339 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: Secondary | ICD-10-CM | POA: Diagnosis not present

## 2020-01-16 DIAGNOSIS — I5040 Unspecified combined systolic (congestive) and diastolic (congestive) heart failure: Secondary | ICD-10-CM | POA: Diagnosis not present

## 2020-01-16 DIAGNOSIS — E118 Type 2 diabetes mellitus with unspecified complications: Secondary | ICD-10-CM | POA: Diagnosis not present

## 2020-02-03 DIAGNOSIS — C4339 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: Secondary | ICD-10-CM | POA: Diagnosis not present

## 2020-02-03 DIAGNOSIS — Z79899 Other long term (current) drug therapy: Secondary | ICD-10-CM | POA: Diagnosis not present

## 2020-03-12 DIAGNOSIS — C4339 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: Secondary | ICD-10-CM | POA: Diagnosis not present

## 2020-03-12 DIAGNOSIS — K8020 Calculus of gallbladder without cholecystitis without obstruction: Secondary | ICD-10-CM | POA: Diagnosis not present

## 2020-03-12 DIAGNOSIS — N281 Cyst of kidney, acquired: Secondary | ICD-10-CM | POA: Diagnosis not present

## 2020-03-12 DIAGNOSIS — I2510 Atherosclerotic heart disease of native coronary artery without angina pectoris: Secondary | ICD-10-CM | POA: Diagnosis not present

## 2020-03-12 DIAGNOSIS — C434 Malignant melanoma of scalp and neck: Secondary | ICD-10-CM | POA: Diagnosis not present

## 2020-03-12 DIAGNOSIS — I700 Atherosclerosis of aorta: Secondary | ICD-10-CM | POA: Diagnosis not present

## 2020-03-12 DIAGNOSIS — I517 Cardiomegaly: Secondary | ICD-10-CM | POA: Diagnosis not present

## 2020-03-12 DIAGNOSIS — N62 Hypertrophy of breast: Secondary | ICD-10-CM | POA: Diagnosis not present

## 2020-03-15 DIAGNOSIS — I509 Heart failure, unspecified: Secondary | ICD-10-CM | POA: Diagnosis not present

## 2020-03-15 DIAGNOSIS — N189 Chronic kidney disease, unspecified: Secondary | ICD-10-CM | POA: Diagnosis not present

## 2020-03-15 DIAGNOSIS — C4339 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: Secondary | ICD-10-CM | POA: Diagnosis not present

## 2020-03-15 DIAGNOSIS — Z950 Presence of cardiac pacemaker: Secondary | ICD-10-CM | POA: Diagnosis not present

## 2020-03-15 DIAGNOSIS — D509 Iron deficiency anemia, unspecified: Secondary | ICD-10-CM | POA: Diagnosis not present

## 2020-04-12 DIAGNOSIS — C4359 Malignant melanoma of other part of trunk: Secondary | ICD-10-CM | POA: Diagnosis not present

## 2020-04-12 DIAGNOSIS — Z79899 Other long term (current) drug therapy: Secondary | ICD-10-CM | POA: Diagnosis not present

## 2020-04-24 DIAGNOSIS — I5040 Unspecified combined systolic (congestive) and diastolic (congestive) heart failure: Secondary | ICD-10-CM | POA: Diagnosis not present

## 2020-04-24 DIAGNOSIS — I10 Essential (primary) hypertension: Secondary | ICD-10-CM | POA: Diagnosis not present

## 2020-04-24 DIAGNOSIS — C4339 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: Secondary | ICD-10-CM | POA: Diagnosis not present

## 2020-04-24 DIAGNOSIS — I2510 Atherosclerotic heart disease of native coronary artery without angina pectoris: Secondary | ICD-10-CM | POA: Diagnosis not present

## 2020-04-24 DIAGNOSIS — Z6827 Body mass index (BMI) 27.0-27.9, adult: Secondary | ICD-10-CM | POA: Diagnosis not present

## 2020-04-24 DIAGNOSIS — I471 Supraventricular tachycardia: Secondary | ICD-10-CM | POA: Diagnosis not present

## 2020-04-24 DIAGNOSIS — E118 Type 2 diabetes mellitus with unspecified complications: Secondary | ICD-10-CM | POA: Diagnosis not present

## 2020-05-23 DIAGNOSIS — E538 Deficiency of other specified B group vitamins: Secondary | ICD-10-CM | POA: Diagnosis not present

## 2020-05-23 DIAGNOSIS — D509 Iron deficiency anemia, unspecified: Secondary | ICD-10-CM | POA: Diagnosis not present

## 2020-05-23 DIAGNOSIS — I509 Heart failure, unspecified: Secondary | ICD-10-CM | POA: Diagnosis not present

## 2020-05-23 DIAGNOSIS — Z950 Presence of cardiac pacemaker: Secondary | ICD-10-CM | POA: Diagnosis not present

## 2020-05-23 DIAGNOSIS — C4339 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: Secondary | ICD-10-CM | POA: Diagnosis not present

## 2020-05-23 DIAGNOSIS — N189 Chronic kidney disease, unspecified: Secondary | ICD-10-CM | POA: Diagnosis not present

## 2020-06-20 DIAGNOSIS — I130 Hypertensive heart and chronic kidney disease with heart failure and stage 1 through stage 4 chronic kidney disease, or unspecified chronic kidney disease: Secondary | ICD-10-CM | POA: Diagnosis not present

## 2020-06-20 DIAGNOSIS — N189 Chronic kidney disease, unspecified: Secondary | ICD-10-CM | POA: Diagnosis not present

## 2020-06-20 DIAGNOSIS — E538 Deficiency of other specified B group vitamins: Secondary | ICD-10-CM | POA: Diagnosis not present

## 2020-06-20 DIAGNOSIS — I509 Heart failure, unspecified: Secondary | ICD-10-CM | POA: Diagnosis not present

## 2020-06-20 DIAGNOSIS — C4339 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: Secondary | ICD-10-CM | POA: Diagnosis not present

## 2020-07-18 DIAGNOSIS — C4339 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: Secondary | ICD-10-CM | POA: Diagnosis not present

## 2020-07-18 DIAGNOSIS — Z79899 Other long term (current) drug therapy: Secondary | ICD-10-CM | POA: Diagnosis not present

## 2020-07-29 ENCOUNTER — Encounter: Payer: Self-pay | Admitting: Oncology

## 2020-07-29 DIAGNOSIS — C4339 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: Secondary | ICD-10-CM | POA: Insufficient documentation

## 2020-08-03 ENCOUNTER — Encounter: Payer: Self-pay | Admitting: Pharmacist

## 2020-08-03 DIAGNOSIS — C4339 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: Secondary | ICD-10-CM

## 2020-08-10 DIAGNOSIS — I2510 Atherosclerotic heart disease of native coronary artery without angina pectoris: Secondary | ICD-10-CM | POA: Diagnosis not present

## 2020-08-10 DIAGNOSIS — I7090 Unspecified atherosclerosis: Secondary | ICD-10-CM | POA: Diagnosis not present

## 2020-08-10 DIAGNOSIS — I700 Atherosclerosis of aorta: Secondary | ICD-10-CM | POA: Diagnosis not present

## 2020-08-10 DIAGNOSIS — R519 Headache, unspecified: Secondary | ICD-10-CM | POA: Diagnosis not present

## 2020-08-10 DIAGNOSIS — J90 Pleural effusion, not elsewhere classified: Secondary | ICD-10-CM | POA: Diagnosis not present

## 2020-08-10 DIAGNOSIS — C4339 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: Secondary | ICD-10-CM | POA: Diagnosis not present

## 2020-08-10 DIAGNOSIS — I313 Pericardial effusion (noninflammatory): Secondary | ICD-10-CM | POA: Diagnosis not present

## 2020-08-10 DIAGNOSIS — Z85820 Personal history of malignant melanoma of skin: Secondary | ICD-10-CM | POA: Diagnosis not present

## 2020-08-10 DIAGNOSIS — R9082 White matter disease, unspecified: Secondary | ICD-10-CM | POA: Diagnosis not present

## 2020-08-15 ENCOUNTER — Other Ambulatory Visit: Payer: Self-pay

## 2020-08-15 ENCOUNTER — Inpatient Hospital Stay: Payer: Medicare PPO

## 2020-08-15 ENCOUNTER — Inpatient Hospital Stay: Payer: Medicare PPO | Attending: Oncology

## 2020-08-15 ENCOUNTER — Other Ambulatory Visit: Payer: Self-pay | Admitting: Hematology and Oncology

## 2020-08-15 VITALS — BP 111/59 | HR 82 | Temp 97.3°F | Resp 20 | Ht 68.0 in | Wt 170.0 lb

## 2020-08-15 DIAGNOSIS — C4330 Malignant melanoma of unspecified part of face: Secondary | ICD-10-CM | POA: Diagnosis not present

## 2020-08-15 DIAGNOSIS — C770 Secondary and unspecified malignant neoplasm of lymph nodes of head, face and neck: Secondary | ICD-10-CM | POA: Diagnosis not present

## 2020-08-15 DIAGNOSIS — N189 Chronic kidney disease, unspecified: Secondary | ICD-10-CM | POA: Diagnosis not present

## 2020-08-15 DIAGNOSIS — D509 Iron deficiency anemia, unspecified: Secondary | ICD-10-CM | POA: Diagnosis not present

## 2020-08-15 DIAGNOSIS — E538 Deficiency of other specified B group vitamins: Secondary | ICD-10-CM | POA: Insufficient documentation

## 2020-08-15 DIAGNOSIS — C4339 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: Secondary | ICD-10-CM | POA: Diagnosis not present

## 2020-08-15 DIAGNOSIS — Z5112 Encounter for antineoplastic immunotherapy: Secondary | ICD-10-CM | POA: Diagnosis not present

## 2020-08-15 DIAGNOSIS — I509 Heart failure, unspecified: Secondary | ICD-10-CM | POA: Diagnosis not present

## 2020-08-15 DIAGNOSIS — Z950 Presence of cardiac pacemaker: Secondary | ICD-10-CM | POA: Diagnosis not present

## 2020-08-15 DIAGNOSIS — F17210 Nicotine dependence, cigarettes, uncomplicated: Secondary | ICD-10-CM | POA: Diagnosis not present

## 2020-08-15 LAB — BASIC METABOLIC PANEL
BUN: 26 — AB (ref 4–21)
CO2: 24 — AB (ref 13–22)
Chloride: 107 (ref 99–108)
Creatinine: 1.2 (ref 0.6–1.3)
Glucose: 149
Potassium: 4.4 (ref 3.4–5.3)
Sodium: 141 (ref 137–147)

## 2020-08-15 LAB — CBC AND DIFFERENTIAL
HCT: 37 — AB (ref 41–53)
Hemoglobin: 12 — AB (ref 13.5–17.5)
Neutrophils Absolute: 3
Platelets: 196 (ref 150–399)
WBC: 4.3

## 2020-08-15 LAB — HEPATIC FUNCTION PANEL
ALT: 12 (ref 10–40)
AST: 18 (ref 14–40)
Alkaline Phosphatase: 63 (ref 25–125)
Bilirubin, Total: 0.5

## 2020-08-15 LAB — CBC: RBC: 3.84 — AB (ref 3.87–5.11)

## 2020-08-15 LAB — COMPREHENSIVE METABOLIC PANEL
Albumin: 4.3 (ref 3.5–5.0)
Calcium: 8.8 (ref 8.7–10.7)

## 2020-08-15 MED ORDER — HEPARIN SOD (PORK) LOCK FLUSH 100 UNIT/ML IV SOLN
500.0000 [IU] | Freq: Once | INTRAVENOUS | Status: AC | PRN
  Administered 2020-08-15: 500 [IU]
  Filled 2020-08-15: qty 5

## 2020-08-15 MED ORDER — CYANOCOBALAMIN 1000 MCG/ML IJ SOLN
INTRAMUSCULAR | Status: AC
  Filled 2020-08-15: qty 1

## 2020-08-15 MED ORDER — SODIUM CHLORIDE 0.9% FLUSH
10.0000 mL | INTRAVENOUS | Status: DC | PRN
  Administered 2020-08-15: 10 mL
  Filled 2020-08-15: qty 10

## 2020-08-15 MED ORDER — SODIUM CHLORIDE 0.9 % IV SOLN
480.0000 mg | Freq: Once | INTRAVENOUS | Status: AC
  Administered 2020-08-15: 480 mg via INTRAVENOUS
  Filled 2020-08-15: qty 48

## 2020-08-15 MED ORDER — CYANOCOBALAMIN 1000 MCG/ML IJ SOLN
1000.0000 ug | Freq: Once | INTRAMUSCULAR | Status: AC
  Administered 2020-08-15: 1000 ug via INTRAMUSCULAR

## 2020-08-15 MED ORDER — SODIUM CHLORIDE 0.9 % IV SOLN
Freq: Once | INTRAVENOUS | Status: AC
  Filled 2020-08-15: qty 250

## 2020-08-15 NOTE — Patient Instructions (Signed)
[facility] Discharge Instructions for Patients Receiving Chemotherapy  Today you received the following chemotherapy agents nivolimab   If you develop nausea and vomiting that is not controlled by your nausea medication, call the clinic.   BELOW ARE SYMPTOMS THAT SHOULD BE REPORTED IMMEDIATELY:  *FEVER GREATER THAN 100.5 F  *CHILLS WITH OR WITHOUT FEVER  NAUSEA AND VOMITING THAT IS NOT CONTROLLED WITH YOUR NAUSEA MEDICATION  *UNUSUAL SHORTNESS OF BREATH  *UNUSUAL BRUISING OR BLEEDING  TENDERNESS IN MOUTH AND THROAT WITH OR WITHOUT PRESENCE OF ULCERS  *URINARY PROBLEMS  *BOWEL PROBLEMS  UNUSUAL RASH Items with * indicate a potential emergency and should be followed up as soon as possible.  Feel free to call the clinic should you have any questions or concerns at The clinic phone number is [phone].  Please show the [facility] at check-in to the Emergency Department and triage nurse.

## 2020-08-15 NOTE — Progress Notes (Signed)
Pt is stable at discharge. 

## 2020-08-15 NOTE — Progress Notes (Signed)
Pt did receive 3ml saline flush at discharge. The barcode did not take after I scanned the pt.

## 2020-08-17 ENCOUNTER — Telehealth: Payer: Self-pay | Admitting: Oncology

## 2020-08-17 NOTE — Telephone Encounter (Signed)
Called pt to confirm next scheduled appt on 11/03. Unable to reach patient . Left message for patient with appt date and time

## 2020-09-11 ENCOUNTER — Inpatient Hospital Stay: Payer: Medicare PPO | Admitting: Hematology and Oncology

## 2020-09-11 ENCOUNTER — Inpatient Hospital Stay: Payer: Medicare PPO

## 2020-09-11 ENCOUNTER — Other Ambulatory Visit: Payer: Medicare PPO

## 2020-09-11 ENCOUNTER — Ambulatory Visit: Payer: Medicare PPO | Admitting: Hematology and Oncology

## 2020-09-12 ENCOUNTER — Inpatient Hospital Stay: Payer: Medicare PPO

## 2020-09-19 ENCOUNTER — Other Ambulatory Visit: Payer: Self-pay | Admitting: Pharmacist

## 2020-09-19 ENCOUNTER — Telehealth: Payer: Self-pay | Admitting: Oncology

## 2020-09-19 NOTE — Progress Notes (Signed)
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 He is here for continued follow up with maintenance nivolumab  Current Treatment:  Nivolumab every 4 weeks   HISTORY OF PRESENT ILLNESS:  Kevin [name] is a 79 y.o. 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  His original lesion was diagnosed in March 2016.  He underwent excision, but refused aggressive surgery.  The lesion was at least 4 mm thick with a Clark's level 4, and a mitotic index every 10/mm.  The margins were positive, but reexcision revealed no residual melanoma.  He had recurrence within 2 months and the lesion had been steadily growing.  He initially had refused seeing a medical oncologist, but finally came to [place] for evaluation and treatment.  He does have extensive comorbidities including COPD, diabetes, hypertension, coronary artery disease, history of myocardial infarction, and pacemaker with AICD.  He had a PET, which showed hypermetabolism in the right submandibular node, but no evidence of distant metastasis.  He was having significant pain and requiring regular hydrocodone doses.  He also has severe disfigurement and avoided going out in public because of this.  He has been receiving palliative nivolumab since November 2016 and has had a good response with a decrease in the right facial tumor.  He has had associated hypomagnesemia and hypokalemia, so is on oral magnesium and potassium supplements, and has also intermittently required IV magnesium and potassium supplementation.  He has had mild chronic anemia, which has been stable.  He was found to have B12 deficiency and continues B12 injections monthly.  CT head/neck in May 2017 revealed some nonspecific skin  thickening in the right face in the area of his melanoma in the right submandibular lymph node had decreased in size, now measuring 6 x 12 mm, but still prominent when compared to the left submandibular node.  He had 15 cycles of nivolumab and relocated to continue to get the same treatment.  He had his 30th dose in February 2018 and then returned to the [place] area.  However, he was admitted to the hospital in early March 2018 for congestive heart failure associated with pleural and pericardial effusions.  He also had changes in the lung and it was unclear as to how much of this was fluid overload versus infection versus pneumonitis from the immunotherapy.  He had no leukocytosis, fever or purulent sputum.  The echocardiogram revealed severe diffuse hypokinesis with mild concentric hypertrophy and markedly depressed ejection fraction of 20 to 25%, associated with moderate mitral regurgitation and moderate left atrial dilatation.  He improved with diuresis.  The pleural effusions were felt to be most likely secondary to congestive heart failure.  He was also placed on corticosteroids due to the possibility of side effects of immunotherapy, but these were subsequently discontinued.  CT chest in March 2018 revealed stable cardiomegaly and stable small pericardial effusion, but no evidence of immune mediated pneumonitis or metastatic disease.  The previously seen bilateral pleural effusions had resolved.  Nivolumab was resumed in April.  CT chest was repeated in early May after 1 month back on therapy.  This was stable, without evidence of recurrent pleural effusions, immunotherapy toxicities, or evidence of metastatic disease.  He was switched to every  28-day nivolumab in August 2018.  He has had stable disease so has continued on nivolumab.  He remains on both furosemide and parenteral lactone.  CT imaging in October 2018 revealed a stable 5 mm level 1B cervical lymph node, as well as a stable 8 mm exophytic lesion  of the right face.  CT chest did not reveal any evidence of metastatic disease.  Borderline cardiomegaly and a small amount of pericardial fluid/thickening was seen, but not reaccumulation of the pleural effusions.  CT head did not reveal any evidence of intracranial metastasis.  There were chronic ischemic changes and evidence of old infarcts.  He was continued with palliative nivolumab.  Repeat CT neck and chest in February 2019 were stable.  When we saw him in March, he had weight gain and increased edema, so we increased his furosemide 40 mg to 1-1/2 tablets daily and requested he follow-up with his primary care provider.  He was admitted to [facility] in May 2019 twice with severe hypoglycemia.  Repeat imaging in May did not reveal any progressive disease.  His nivolumab was held in May, but resumed at the end of June.  He did not have a dose in July.  Unfortunately, his wife expired in July 2019 and she was his primary caregiver.  His daughter has been caring for him since.  CT neck and chest in November 2019 did not reveal any evidence of recurrent or metastatic disease.  CT imaging in January 2020 did not reveal evidence of recurrence, so he has continued nivolumab every 4 weeks.  At his visit in March, his daughter felt he was depressed and still grieving the loss of his wife.  He continued to slowly lose weight as well, so he was started on mirtazapine 7.5 mg at bedtime.  The mirtazapine was subsequently increased to 15 mg with stabilization of his weight.  CT scans in June remained stable with a small pericardial effusion and bilateral renal cysts.  Lisinopril was decreased from 20 mg daily to 10 mg daily and the spironolactone was decreased from 50 mg daily to 25 mg daily by [name], PA-C due to hypotension.  In October, insurance required repeat imaging prior to further nivolumab.  CT imaging in November did not reveal any evidence of recurrent or metastatic disease, so he has continued  nivolumab every 4 weeks.  CT neck and chest from May 2021 revealed no evidence of metastatic disease.CT head, neck and chest from October 1 was negative for evidence of metastatic disease.   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  He reports feeling well since his last visit.  He denies fever, chills, nausea or vomiting.  He denies issues with bowel or bladder.  Both his energy and appetite are good. His weight has increased 4 pounds over last 4 weeks.  REVIEW OF SYSTEMS:  Review of Systems - Oncology   VITALS:  There were no vitals taken for this visit.  Wt Readings from Last 3 Encounters:  08/31/20 170 lb 1.6 oz (77.2 kg)  08/15/20 170 lb 1.6 oz (77.2 kg)  08/15/20 170 lb (77.1 kg)    There is no height or weight on file to calculate BMI.  Performance status (ECOG): 1 - Symptomatic but completely ambulatory  PHYSICAL EXAM:  Physical Exam Lymph nodes:   There is no cervical, clavicular, axillary or lymphadenopathy.   LABS:   CBC Latest Ref  Rng & Units 08/15/2020 04/21/2018 04/20/2018  WBC - 4.3 4.8 6.1  Hemoglobin 13.5 - 17.5 12.0(A) 10.6(L) 10.9(L)  Hematocrit 41 - 53 37(A) 32.6(L) 34.0(L)  Platelets 150 - 399 196 164 170   CMP Latest Ref Rng & Units 08/15/2020 04/22/2018 04/21/2018  Glucose 65 - 99 mg/dL - 233(H) 68  BUN 4 - 21 26(A) 18 13  Creatinine 0.6 - 1.3 1.2 1.12 1.08  Sodium 137 - 147 141 144 144  Potassium 3.4 - 5.3 4.4 3.6 3.0(L)  Chloride 99 - 108 107 104 104  CO2 13 - 22 24(A) 30 29  Calcium 8.7 - 10.7 8.8 8.9 9.0  Total Protein 6.5 - 8.1 g/dL - - -  Total Bilirubin 0.3 - 1.2 mg/dL - - -  Alkaline Phos 25 - 125 63 - -  AST 14 - 40 18 - -  ALT 10 - 40 12 - -     No results found for: CEA1 / No results found for: CEA1 No results found for: PSA1 No results found for: XLK440 No results found for: NUU725  No results found for: TOTALPROTELP, ALBUMINELP, A1GS, A2GS, BETS, BETA2SER,  GAMS, MSPIKE, SPEI No results found for: TIBC, FERRITIN, IRONPCTSAT No results found for: LDH  STUDIES:  No results found.    HISTORY:   Past Medical History:  Diagnosis Date  . Cancer ([place])   . CHF (congestive heart failure) ([place])   . Diabetes mellitus without complication ([place])   . Hypertension   . 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) ([place])   . 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) ([place])   . 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) [facility])     Past Surgical History:  Procedure Laterality Date  . APPENDECTOMY    . CARDIAC CATHETERIZATION    . CORONARY ANGIOPLASTY    . PACEMAKER INSERTION      Family History  Problem Relation Age of Onset  . Diabetes Mellitus II Father     Social History:  reports that he has been smoking cigarettes. He has a 60.00 pack-year smoking history. He has never used smokeless tobacco. He reports previous alcohol use. He reports previous drug use.The patient is alone  today.  Allergies: No Known Allergies  Current Medications: Current Outpatient Medications  Medication Sig Dispense Refill  . albuterol (PROVENTIL HFA;VENTOLIN HFA) 108 (90 Base) MCG/ACT inhaler Inhale 1-2 puffs into the lungs every 6 (six) hours as needed for wheezing or shortness of breath.    Marland Kitchen albuterol (PROVENTIL) (2.5 MG/3ML) 0.083% nebulizer solution Take 2.5 mg by nebulization every 6 (six) hours as needed for wheezing or shortness of breath.    Marland Kitchen amiodarone (PACERONE) 200 MG tablet Take 100 mg by mouth daily.    Marland Kitchen aspirin EC 81 MG tablet Take 81 mg by mouth daily.    Marland Kitchen atorvastatin (LIPITOR) 80 MG tablet Take 80 mg by mouth at bedtime.    . budesonide-formoterol (SYMBICORT) 80-4.5 MCG/ACT inhaler Inhale 2 puffs into the lungs 2 (two) times daily.    . Ferrous Fumarate (HEMOCYTE - 106 MG FE) 324 (106 Fe) MG TABS tablet Take 1 tablet by mouth.    . furosemide (LASIX) 40 MG tablet Take 40 mg by mouth daily.    Marland Kitchen lisinopril (PRINIVIL,ZESTRIL) 20 MG  tablet Take 20 mg by mouth daily.    . magnesium oxide (MAG-OX) 400 (241.3 Mg) MG tablet Take 400 mg by mouth 2 (two) times daily.    . magnesium oxide (  MAG-OX) 400 MG tablet Take 400 mg by mouth 2 (two) times daily.    . metFORMIN (GLUCOPHAGE) 500 MG tablet Take 1,000 mg by mouth 2 (two) times daily with a meal.    . nystatin (MYCOSTATIN/NYSTOP) powder Apply 1 application topically 3 (three) times daily. 100,000units/gram powder    . pioglitazone (ACTOS) 30 MG tablet Take 30 mg by mouth daily.    . polyethylene glycol (MIRALAX / GLYCOLAX) 17 g packet Take 17 g by mouth daily.    . prochlorperazine (COMPAZINE) 10 MG tablet Take 10 mg by mouth every 6 (six) hours as needed for nausea or vomiting.    . sitaGLIPtin (JANUVIA) 100 MG tablet Take 100 mg by mouth daily.    Marland Kitchen spironolactone (ALDACTONE) 50 MG tablet Take 50 mg by mouth daily.    Marland Kitchen triamcinolone (KENALOG) 0.025 % cream Apply 1 application topically 2 (two) times daily.    . Vitamin D, Ergocalciferol, (DRISDOL) 50000 units CAPS capsule Take 50,000 Units by mouth every 7 (seven) days.     No current facility-administered medications for this visit.     ASSESSMENT & PLAN:   Assessment:  Luisalberto Olanda Downie is a 79 y.o. male with recurrent/metastatic melanoma, which remains controlled with palliative nivolumab.  He also has B12 deficiency, for which she continues monthly B12 injections.  His chronic kidney disease remains in normal range.  He continues oral iron supplement daily he continues magnesium supplement daily.  Plan: 1.  He will return to clinic in 4 weeks for repeat CBC, CMP, TSH and evaluation prior to his next cycle of nivolumab. 2.  He will continue iron and magnesium supplements daily. 3.  We will continue with nivolumab and B12 injection scheduled for Monday, 09/24/2020. 4.  I will send in refills of his Zofran today as well as refill on his nystatin powder which he uses for itching.  The patient understands the plans  discussed today and is in agreement with them.    The patient knows to contact our office if his develops concerns prior to his next appointment.   I provided 30 minutes (9:30 AM - 9:30 AM) of face-to-face time during this this encounter and > 50% was spent counseling as documented under my assessment and plan.    Melodye Ped, NP

## 2020-09-19 NOTE — Telephone Encounter (Signed)
Per 11/10 sched appt.Updated appts will be given to pat.

## 2020-09-20 ENCOUNTER — Encounter: Payer: Self-pay | Admitting: Hematology and Oncology

## 2020-09-20 ENCOUNTER — Inpatient Hospital Stay (INDEPENDENT_AMBULATORY_CARE_PROVIDER_SITE_OTHER): Payer: Medicare PPO | Admitting: Hematology and Oncology

## 2020-09-20 ENCOUNTER — Inpatient Hospital Stay: Payer: Medicare PPO | Attending: Oncology

## 2020-09-20 VITALS — BP 131/66 | HR 72 | Temp 98.3°F | Resp 16 | Ht 68.0 in | Wt 174.1 lb

## 2020-09-20 DIAGNOSIS — Z7984 Long term (current) use of oral hypoglycemic drugs: Secondary | ICD-10-CM | POA: Insufficient documentation

## 2020-09-20 DIAGNOSIS — C4330 Malignant melanoma of unspecified part of face: Secondary | ICD-10-CM | POA: Diagnosis not present

## 2020-09-20 DIAGNOSIS — E876 Hypokalemia: Secondary | ICD-10-CM | POA: Diagnosis not present

## 2020-09-20 DIAGNOSIS — E538 Deficiency of other specified B group vitamins: Secondary | ICD-10-CM | POA: Insufficient documentation

## 2020-09-20 DIAGNOSIS — F17210 Nicotine dependence, cigarettes, uncomplicated: Secondary | ICD-10-CM | POA: Insufficient documentation

## 2020-09-20 DIAGNOSIS — Z95810 Presence of automatic (implantable) cardiac defibrillator: Secondary | ICD-10-CM | POA: Diagnosis not present

## 2020-09-20 DIAGNOSIS — E11649 Type 2 diabetes mellitus with hypoglycemia without coma: Secondary | ICD-10-CM | POA: Diagnosis not present

## 2020-09-20 DIAGNOSIS — Z5112 Encounter for antineoplastic immunotherapy: Secondary | ICD-10-CM | POA: Insufficient documentation

## 2020-09-20 DIAGNOSIS — I252 Old myocardial infarction: Secondary | ICD-10-CM | POA: Diagnosis not present

## 2020-09-20 DIAGNOSIS — C4339 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: Secondary | ICD-10-CM | POA: Diagnosis not present

## 2020-09-20 DIAGNOSIS — I2510 Atherosclerotic heart disease of native coronary artery without angina pectoris: Secondary | ICD-10-CM | POA: Diagnosis not present

## 2020-09-20 DIAGNOSIS — J449 Chronic obstructive pulmonary disease, unspecified: Secondary | ICD-10-CM | POA: Insufficient documentation

## 2020-09-20 DIAGNOSIS — D649 Anemia, unspecified: Secondary | ICD-10-CM | POA: Insufficient documentation

## 2020-09-20 DIAGNOSIS — I509 Heart failure, unspecified: Secondary | ICD-10-CM | POA: Insufficient documentation

## 2020-09-20 DIAGNOSIS — E1122 Type 2 diabetes mellitus with diabetic chronic kidney disease: Secondary | ICD-10-CM | POA: Diagnosis not present

## 2020-09-20 DIAGNOSIS — Z7982 Long term (current) use of aspirin: Secondary | ICD-10-CM | POA: Insufficient documentation

## 2020-09-20 DIAGNOSIS — I130 Hypertensive heart and chronic kidney disease with heart failure and stage 1 through stage 4 chronic kidney disease, or unspecified chronic kidney disease: Secondary | ICD-10-CM | POA: Insufficient documentation

## 2020-09-20 LAB — CMP (CANCER CENTER ONLY)
ALT: 13 U/L (ref 0–44)
AST: 15 U/L (ref 15–41)
Albumin: 4.7 g/dL (ref 3.5–5.0)
Alkaline Phosphatase: 51 U/L (ref 38–126)
Anion gap: 10 (ref 5–15)
BUN: 24 mg/dL — ABNORMAL HIGH (ref 8–23)
CO2: 25 mmol/L (ref 22–32)
Calcium: 9.2 mg/dL (ref 8.9–10.3)
Chloride: 104 mmol/L (ref 98–111)
Creatinine: 1.17 mg/dL (ref 0.61–1.24)
GFR, Estimated: >60 mL/min (ref >60)
Glucose, Bld: 116 mg/dL — ABNORMAL HIGH (ref 70–99)
Potassium: 4.8 mmol/L (ref 3.5–5.1)
Sodium: 139 mmol/L (ref 135–145)
Total Bilirubin: 0.6 mg/dL (ref 0.3–1.2)
Total Protein: 7.4 g/dL (ref 6.5–8.1)

## 2020-09-20 LAB — CBC WITH DIFFERENTIAL (CANCER CENTER ONLY)
Abs Immature Granulocytes: 0.03 10*3/uL (ref 0.00–0.07)
Basophils Absolute: 0.1 10*3/uL (ref 0.0–0.1)
Basophils Relative: 1 %
Eosinophils Absolute: 0.1 10*3/uL (ref 0.0–0.5)
Eosinophils Relative: 1 %
HCT: 35.6 % — ABNORMAL LOW (ref 39.0–52.0)
Hemoglobin: 11.2 g/dL — ABNORMAL LOW (ref 13.0–17.0)
Immature Granulocytes: 1 %
Lymphocytes Relative: 17 %
Lymphs Abs: 0.8 10*3/uL (ref 0.7–4.0)
MCH: 31.6 pg (ref 26.0–34.0)
MCHC: 31.5 g/dL (ref 30.0–36.0)
MCV: 100.6 fL — ABNORMAL HIGH (ref 80.0–100.0)
Monocytes Absolute: 0.5 10*3/uL (ref 0.1–1.0)
Monocytes Relative: 10 %
Neutro Abs: 3.4 10*3/uL (ref 1.7–7.7)
Neutrophils Relative %: 70 %
Platelet Count: 183 10*3/uL (ref 150–400)
RBC: 3.54 MIL/uL — ABNORMAL LOW (ref 4.22–5.81)
RDW: 14.9 % (ref 11.5–15.5)
WBC Count: 4.8 10*3/uL (ref 4.0–10.5)
nRBC: 0 % (ref 0.0–0.2)

## 2020-09-20 LAB — TSH: TSH: 2.241 u[IU]/mL (ref 0.350–4.500)

## 2020-09-20 MED ORDER — NYSTATIN 100000 UNIT/GM EX POWD: 1.0000 "application " | Freq: Three times a day (TID) | CUTANEOUS | 0 refills | Status: DC

## 2020-09-20 MED ORDER — ONDANSETRON HCL 4 MG PO TABS: 4.0000 mg | ORAL_TABLET | ORAL | 3 refills | Status: DC | PRN

## 2020-09-21 ENCOUNTER — Inpatient Hospital Stay: Payer: Medicare PPO

## 2020-09-21 ENCOUNTER — Other Ambulatory Visit: Payer: Self-pay

## 2020-09-21 VITALS — BP 133/60 | HR 80 | Temp 98.0°F | Resp 18 | Ht 68.0 in | Wt 176.2 lb

## 2020-09-21 DIAGNOSIS — J449 Chronic obstructive pulmonary disease, unspecified: Secondary | ICD-10-CM | POA: Diagnosis not present

## 2020-09-21 DIAGNOSIS — E11649 Type 2 diabetes mellitus with hypoglycemia without coma: Secondary | ICD-10-CM | POA: Diagnosis not present

## 2020-09-21 DIAGNOSIS — C4339 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: Secondary | ICD-10-CM

## 2020-09-21 DIAGNOSIS — I509 Heart failure, unspecified: Secondary | ICD-10-CM | POA: Diagnosis not present

## 2020-09-21 DIAGNOSIS — Z5112 Encounter for antineoplastic immunotherapy: Secondary | ICD-10-CM | POA: Diagnosis not present

## 2020-09-21 DIAGNOSIS — C4330 Malignant melanoma of unspecified part of face: Secondary | ICD-10-CM | POA: Diagnosis not present

## 2020-09-21 DIAGNOSIS — E538 Deficiency of other specified B group vitamins: Secondary | ICD-10-CM | POA: Diagnosis not present

## 2020-09-21 DIAGNOSIS — E876 Hypokalemia: Secondary | ICD-10-CM | POA: Diagnosis not present

## 2020-09-21 DIAGNOSIS — D649 Anemia, unspecified: Secondary | ICD-10-CM | POA: Diagnosis not present

## 2020-09-21 MED ORDER — CYANOCOBALAMIN 1000 MCG/ML IJ SOLN
INTRAMUSCULAR | Status: AC
  Filled 2020-09-21: qty 1

## 2020-09-21 MED ORDER — SODIUM CHLORIDE 0.9 % IV SOLN
480.0000 mg | Freq: Once | INTRAVENOUS | Status: AC
  Administered 2020-09-21: 480 mg via INTRAVENOUS
  Filled 2020-09-21: qty 48

## 2020-09-21 MED ORDER — SODIUM CHLORIDE 0.9 % IV SOLN
Freq: Once | INTRAVENOUS | Status: AC
  Filled 2020-09-21: qty 250

## 2020-09-21 MED ORDER — CYANOCOBALAMIN 1000 MCG/ML IJ SOLN
1000.0000 ug | Freq: Once | INTRAMUSCULAR | Status: AC
  Administered 2020-09-21: 1000 ug via INTRAMUSCULAR

## 2020-09-21 MED ORDER — HEPARIN SOD (PORK) LOCK FLUSH 100 UNIT/ML IV SOLN
500.0000 [IU] | Freq: Once | INTRAVENOUS | Status: AC | PRN
  Administered 2020-09-21: 500 [IU]
  Filled 2020-09-21: qty 5

## 2020-09-21 NOTE — Patient Instructions (Signed)
[facility] Discharge Instructions for Patients Receiving Chemotherapy  Today you received the following chemotherapy agents Nivolumab  To help prevent nausea and vomiting after your treatment, we encourage you to take your nausea medication.   If you develop nausea and vomiting that is not controlled by your nausea medication, call the clinic.   BELOW ARE SYMPTOMS THAT SHOULD BE REPORTED IMMEDIATELY:  *FEVER GREATER THAN 100.5 F  *CHILLS WITH OR WITHOUT FEVER  NAUSEA AND VOMITING THAT IS NOT CONTROLLED WITH YOUR NAUSEA MEDICATION  *UNUSUAL SHORTNESS OF BREATH  *UNUSUAL BRUISING OR BLEEDING  TENDERNESS IN MOUTH AND THROAT WITH OR WITHOUT PRESENCE OF ULCERS  *URINARY PROBLEMS  *BOWEL PROBLEMS  UNUSUAL RASH Items with * indicate a potential emergency and should be followed up as soon as possible.  Feel free to call the clinic should you have any questions or concerns at The clinic phone number is [phone].  Please show the CHEMO ALERT CARD at check-in to the Emergency Department and triage nurse.   

## 2020-09-21 NOTE — Progress Notes (Signed)
Pt stable at time of discharge (1625).

## 2020-09-22 LAB — T4: T4, Total: 8 ug/dL (ref 4.5–12.0)

## 2020-10-01 NOTE — Progress Notes (Signed)
PT STABLE AT TIME OF DISCHARGE 

## 2020-10-10 ENCOUNTER — Ambulatory Visit: Payer: Medicare PPO

## 2020-10-15 ENCOUNTER — Other Ambulatory Visit: Payer: Self-pay | Admitting: Oncology

## 2020-10-16 NOTE — Progress Notes (Signed)
[facility]  [address] [place],  [place]  [address] [phone]  Clinic Day:  10/17/2020  Referring physician: [name], PA-C   This document serves as a record of services personally performed by [name], MD. It was created on their behalf by [name],[name], a trained medical scribe. The creation of this record is based on the scribe's personal observations and the provider's statements to them.   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 He is here for continued follow up with maintenance nivolumab  Current Treatment:  Nivolumab every 4 weeks   HISTORY OF PRESENT ILLNESS:  Kevin [name] is a 79 y.o. 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  His original lesion was diagnosed in March 2016.  He underwent excision, but refused aggressive surgery.  The lesion was at least 4 mm thick with a Clark's level 4, and a mitotic index every 10/mm.  The margins were positive, but reexcision revealed no residual melanoma.  He had recurrence within 2 months and the lesion had been steadily growing.  He initially had refused seeing a medical oncologist, but finally came to [place] for evaluation and treatment.  He does have extensive comorbidities including COPD, diabetes, hypertension, coronary artery disease, history of myocardial infarction, and pacemaker with AICD.  He had a PET, which showed hypermetabolism in the right submandibular node, but no evidence of distant metastasis.  He was having significant pain and requiring regular hydrocodone doses.  He also has severe disfigurement and avoided going out in public because of this.  He has been receiving palliative nivolumab since November 2016 and has had a good response with a decrease in the right facial tumor.  He has had associated hypomagnesemia and hypokalemia, so is on oral magnesium and potassium  supplements, and has also intermittently required IV magnesium and potassium supplementation.  He has had mild chronic anemia, which has been stable.  He was found to have B12 deficiency and continues B12 injections monthly.  CT head/neck in May 2017 revealed some nonspecific skin thickening in the right face in the area of his melanoma in the right submandibular lymph node had decreased in size, now measuring 6 x 12 mm, but still prominent when compared to the left submandibular node.  He had 15 cycles of nivolumab and relocated to continue to get the same treatment.  He had his 30th dose in February 2018 and then returned to the [place] area.  However, he was admitted to the hospital in early March 2018 for congestive heart failure associated with pleural and pericardial effusions.  He also had changes in the lung and it was unclear as to how much of this was fluid overload versus infection versus pneumonitis from the immunotherapy.  He had no leukocytosis, fever or purulent sputum.  The echocardiogram revealed severe diffuse hypokinesis with mild concentric hypertrophy and markedly depressed ejection fraction of 20 to 25%, associated with moderate mitral regurgitation and moderate left atrial dilatation.  He improved with diuresis.  The pleural effusions were felt to be most likely secondary to congestive heart failure.  He was also placed on corticosteroids due to the possibility of side effects of immunotherapy, but these were subsequently discontinued.  CT chest in March 2018 revealed stable cardiomegaly and stable small pericardial effusion, but no evidence of immune mediated pneumonitis or metastatic disease.  The previously seen bilateral  pleural effusions had resolved.  Nivolumab was resumed in April.  CT chest was repeated in early May after 1 month back on therapy.  This was stable, without evidence of recurrent pleural effusions, immunotherapy toxicities, or evidence of metastatic disease.  He was  switched to every 28-day nivolumab in August 2018.  He has had stable disease so has continued on nivolumab.  He remains on both furosemide and parenteral lactone.  CT imaging in October 2018 revealed a stable 5 mm level 1B cervical lymph node, as well as a stable 8 mm exophytic lesion of the right face.  CT chest did not reveal any evidence of metastatic disease.  Borderline cardiomegaly and a small amount of pericardial fluid/thickening was seen, but not reaccumulation of the pleural effusions.  CT head did not reveal any evidence of intracranial metastasis.  There were chronic ischemic changes and evidence of old infarcts.  He was continued with palliative nivolumab.  Repeat CT neck and chest in February 2019 were stable.  When we saw him in March, he had weight gain and increased edema, so we increased his furosemide 40 mg to 1-1/2 tablets daily and requested he follow-up with his primary care provider.  He was admitted to [facility] in May 2019 twice with severe hypoglycemia.  Repeat imaging in May did not reveal any progressive disease.  His nivolumab was held in May, but resumed at the end of June.  He did not have a dose in July.  Unfortunately, his wife expired in July 2019 and she was his primary caregiver.  His daughter has been caring for him since.  CT neck and chest in November 2019 did not reveal any evidence of recurrent or metastatic disease.  CT imaging in January 2020 did not reveal evidence of recurrence, so he has continued nivolumab every 4 weeks.  He was given mirtazapine for depression and weight loss the dose was increased to 15 mg.  CT scans in June remained stable with a small pericardial effusion and bilateral renal cysts.   CT head, neck and chest from October 1 was negative for evidence of metastatic disease.   INTERVAL HISTORY:  He is here for follow up prior to a 4th cycle of nivolumab.  He states that he has been doing well, and denies complaints other than intermittent ankle  and foot swelling.  This resolves by morning.  He also notes itching of the skin.  He was given Mycostatin powder but 15 g cost him $39 dollars, so I will try him on Mycostatin cream instead.  His hemoglobin has decreased from 12.0 to 11.3, and his white count and platelets are normal.  Chemistries are unremarkable.  His  appetite is good, and he has gained 1 and 1/2 pounds since his last visit.  He denies fever, chills or other signs of infection.  He denies nausea, vomiting, bowel issues, or abdominal pain.  He denies sore throat, cough, dyspnea, or chest pain.   REVIEW OF SYSTEMS:  Review of Systems  Constitutional: Negative.   HENT:  Negative.   Eyes: Negative.   Respiratory: Negative.   Cardiovascular: Positive for leg swelling (resolves by morning).  Gastrointestinal: Negative.   Endocrine: Negative.   Genitourinary: Negative.    Musculoskeletal: Negative.   Skin: Positive for itching.  Neurological: Negative.   Hematological: Negative.   Psychiatric/Behavioral: Negative.   All other systems reviewed and are negative.    VITALS:  Blood pressure (!) 142/63, pulse 74, temperature 98 F (36.7 C),  temperature source Oral, resp. rate 18, height 5\' 8"  (1.727 m), weight 177 lb 9.6 oz (80.6 kg), SpO2 97 %.  Wt Readings from Last 3 Encounters:  10/17/20 177 lb 9.6 oz (80.6 kg)  08/15/20 170 lb 1 oz (77.1 kg)  09/21/20 176 lb 4 oz (79.9 kg)    Body mass index is 27 kg/m.  Performance status (ECOG): 1 - Symptomatic but completely ambulatory  PHYSICAL EXAM:  Physical Exam Constitutional:      General: He is not in acute distress.    Appearance: Normal appearance. He is normal weight.  HENT:     Head: Normocephalic and atraumatic.  Eyes:     General: No scleral icterus.    Extraocular Movements: Extraocular movements intact.     Conjunctiva/sclera: Conjunctivae normal.     Pupils: Pupils are equal, round, and reactive to light.  Cardiovascular:     Rate and Rhythm: Normal rate  and regular rhythm.     Pulses: Normal pulses.     Heart sounds: Normal heart sounds. No murmur heard.  No friction rub. No gallop.   Pulmonary:     Effort: Pulmonary effort is normal. No respiratory distress.     Breath sounds: Normal breath sounds.  Abdominal:     General: Bowel sounds are normal. There is no distension.     Palpations: Abdomen is soft. There is no mass.     Tenderness: There is no abdominal tenderness.  Musculoskeletal:        General: Normal range of motion.     Cervical back: Normal range of motion and neck supple.     Right lower leg: Edema (pedal, trace) present.     Left lower leg: Edema (pedal, trace, greater than right) present.  Lymphadenopathy:     Cervical: No cervical adenopathy.  Skin:    General: Skin is warm and dry.  Neurological:     General: No focal deficit present.     Mental Status: He is alert and oriented to person, place, and time. Mental status is at baseline.  Psychiatric:        Mood and Affect: Mood normal.        Behavior: Behavior normal.        Thought Content: Thought content normal.        Judgment: Judgment normal.   Lymph nodes:   There is no cervical, clavicular, axillary or lymphadenopathy.   LABS:   CBC Latest Ref Rng & Units 10/17/2020 09/20/2020 08/15/2020  WBC - 4.9 4.8 4.3  Hemoglobin 13.5 - 17.5 11.3(A) 11.2(L) 12.0(A)  Hematocrit 41 - 53 34(A) 35.6(L) 37(A)  Platelets 150 - 399 190 183 196   CMP Latest Ref Rng & Units 10/17/2020 09/20/2020 08/15/2020  Glucose 70 - 99 mg/dL - 116(H) -  BUN 4 - 21 20 24(H) 26(A)  Creatinine 0.6 - 1.3 1.2 1.17 1.2  Sodium 137 - 147 139 139 141  Potassium 3.4 - 5.3 4.5 4.8 4.4  Chloride 99 - 108 103 104 107  CO2 13 - 22 28(A) 25 24(A)  Calcium 8.7 - 10.7 9.2 9.2 8.8  Total Protein 6.5 - 8.1 g/dL - 7.4 -  Total Bilirubin 0.3 - 1.2 mg/dL - 0.6 -  Alkaline Phos 25 - 125 58 51 63  AST 14 - 40 18 15 18   ALT 10 - 40 11 13 12      STUDIES:  No results found.    HISTORY:    Allergies: No Known Allergies  Current Medications: Current Outpatient Medications  Medication Sig Dispense Refill  . atorvastatin (LIPITOR) 80 MG tablet Take 80 mg by mouth at bedtime.    . budesonide-formoterol (SYMBICORT) 80-4.5 MCG/ACT inhaler Inhale 2 puffs into the lungs 2 (two) times daily.    . Ferrous Fumarate (HEMOCYTE - 106 MG FE) 324 (106 Fe) MG TABS tablet Take 1 tablet by mouth.    . furosemide (LASIX) 40 MG tablet Take 40 mg by mouth daily.    Marland Kitchen lisinopril (ZESTRIL) 10 MG tablet Take 10 mg by mouth daily.    . magnesium oxide (MAG-OX) 400 (241.3 Mg) MG tablet Take 400 mg by mouth 2 (two) times daily.    . metFORMIN (GLUCOPHAGE) 500 MG tablet Take 1,000 mg by mouth 2 (two) times daily with a meal.    . mirtazapine (REMERON) 15 MG tablet Take 15 mg by mouth at bedtime.    Marland Kitchen nystatin (MYCOSTATIN/NYSTOP) powder Apply 1 application topically 3 (three) times daily. 15 g 0  . ondansetron (ZOFRAN) 4 MG tablet Take 1 tablet (4 mg total) by mouth every 4 (four) hours as needed for nausea. 90 tablet 3  . ondansetron (ZOFRAN-ODT) 4 MG disintegrating tablet Take by mouth at bedtime.    . pioglitazone (ACTOS) 30 MG tablet Take 30 mg by mouth daily.    . polyethylene glycol (MIRALAX / GLYCOLAX) 17 g packet Take 17 g by mouth daily.    . prochlorperazine (COMPAZINE) 10 MG tablet Take 10 mg by mouth every 6 (six) hours as needed for nausea or vomiting.    . sitaGLIPtin (JANUVIA) 100 MG tablet Take 100 mg by mouth daily.    Marland Kitchen spironolactone (ALDACTONE) 25 MG tablet Take 25 mg by mouth daily.    Marland Kitchen triamcinolone (KENALOG) 0.025 % cream Apply 1 application topically 2 (two) times daily.     No current facility-administered medications for this visit.     ASSESSMENT & PLAN:   Assessment:   1. Recurrent/metastatic melanoma, which remains controlled with palliative nivolumab.   2. B12 deficiency, for which he continues monthly B12 injections.  3. Chronic kidney disease, which now is in a  normal range.  4. Congestive heart failure and pacemaker.  5. 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  He continues oral iron supplement daily.   6. Mild hypomagnesemia, resolved with oral supplement twice daily, which he will continue.  7. Tobacco abuse, we discussed the importance of complete abstinence from tobacco.  The patient is not motivated to quit at this time.    Plan: He will proceed with a 4th cycle of nivolumab and B12 injection on December 10th.  He knows to continue oral iron and magnesium supplements daily.  I will send in a prescription for Mycostatin cream for his pruritus.  We will see him back in 4 weeks for repeat CBC, CMP, magnesium, TSH and evaluation prior to his next cycle of nivolumab.  The patient understands the plans discussed today and is in agreement with them.  The patient knows to contact our office if his develops concerns prior to his next appointment.   I provided 20 minutes of face-to-face time during this this encounter and > 50% was spent counseling as documented under my assessment and plan.    [name], MD [facility] AT [facility] [address] [place] [address] Dept: [phone] Dept Fax: [phone]   I, Rita Ohara, am acting as scribe for [employer].  Hinton Rao, MD  I have reviewed this report as typed by the medical scribe, and it is complete and accurate.

## 2020-10-17 ENCOUNTER — Inpatient Hospital Stay: Payer: Medicare PPO

## 2020-10-17 ENCOUNTER — Inpatient Hospital Stay: Payer: Medicare PPO | Attending: Oncology | Admitting: Oncology

## 2020-10-17 ENCOUNTER — Other Ambulatory Visit: Payer: Self-pay | Admitting: Hematology and Oncology

## 2020-10-17 ENCOUNTER — Other Ambulatory Visit: Payer: Self-pay | Admitting: Oncology

## 2020-10-17 ENCOUNTER — Encounter: Payer: Self-pay | Admitting: Oncology

## 2020-10-17 VITALS — BP 142/63 | HR 74 | Temp 98.0°F | Resp 18 | Ht 68.0 in | Wt 177.6 lb

## 2020-10-17 DIAGNOSIS — C4339 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: Secondary | ICD-10-CM

## 2020-10-17 DIAGNOSIS — Z720 Tobacco use: Secondary | ICD-10-CM | POA: Insufficient documentation

## 2020-10-17 DIAGNOSIS — E538 Deficiency of other specified B group vitamins: Secondary | ICD-10-CM | POA: Diagnosis not present

## 2020-10-17 DIAGNOSIS — I130 Hypertensive heart and chronic kidney disease with heart failure and stage 1 through stage 4 chronic kidney disease, or unspecified chronic kidney disease: Secondary | ICD-10-CM | POA: Insufficient documentation

## 2020-10-17 DIAGNOSIS — C4330 Malignant melanoma of unspecified part of face: Secondary | ICD-10-CM | POA: Insufficient documentation

## 2020-10-17 DIAGNOSIS — D649 Anemia, unspecified: Secondary | ICD-10-CM | POA: Diagnosis not present

## 2020-10-17 DIAGNOSIS — D509 Iron deficiency anemia, unspecified: Secondary | ICD-10-CM | POA: Insufficient documentation

## 2020-10-17 DIAGNOSIS — Z7984 Long term (current) use of oral hypoglycemic drugs: Secondary | ICD-10-CM | POA: Insufficient documentation

## 2020-10-17 DIAGNOSIS — B369 Superficial mycosis, unspecified: Secondary | ICD-10-CM

## 2020-10-17 DIAGNOSIS — N189 Chronic kidney disease, unspecified: Secondary | ICD-10-CM | POA: Diagnosis not present

## 2020-10-17 DIAGNOSIS — Z79899 Other long term (current) drug therapy: Secondary | ICD-10-CM | POA: Insufficient documentation

## 2020-10-17 DIAGNOSIS — Z950 Presence of cardiac pacemaker: Secondary | ICD-10-CM | POA: Diagnosis not present

## 2020-10-17 DIAGNOSIS — Z5112 Encounter for antineoplastic immunotherapy: Secondary | ICD-10-CM | POA: Diagnosis not present

## 2020-10-17 DIAGNOSIS — Z0001 Encounter for general adult medical examination with abnormal findings: Secondary | ICD-10-CM | POA: Diagnosis not present

## 2020-10-17 LAB — CBC: RBC: 3.59 — AB (ref 3.87–5.11)

## 2020-10-17 LAB — HEPATIC FUNCTION PANEL
ALT: 11 (ref 10–40)
AST: 18 (ref 14–40)
Alkaline Phosphatase: 58 (ref 25–125)
Bilirubin, Total: 0.5

## 2020-10-17 LAB — COMPREHENSIVE METABOLIC PANEL
Albumin: 4.4 (ref 3.5–5.0)
Calcium: 9.2 (ref 8.7–10.7)

## 2020-10-17 LAB — BASIC METABOLIC PANEL
BUN: 20 (ref 4–21)
CO2: 28 — AB (ref 13–22)
Chloride: 103 (ref 99–108)
Creatinine: 1.2 (ref 0.6–1.3)
Glucose: 154
Potassium: 4.5 (ref 3.4–5.3)
Sodium: 139 (ref 137–147)

## 2020-10-17 LAB — CBC AND DIFFERENTIAL
HCT: 34 — AB (ref 41–53)
Hemoglobin: 11.3 — AB (ref 13.5–17.5)
Neutrophils Absolute: 3.48
Platelets: 190 (ref 150–399)
WBC: 4.9

## 2020-10-17 LAB — TSH: TSH: 2.038 u[IU]/mL (ref 0.350–4.500)

## 2020-10-17 MED ORDER — CLOTRIMAZOLE-BETAMETHASONE 1-0.05 % EX CREA: 1.0000 "application " | TOPICAL_CREAM | Freq: Two times a day (BID) | CUTANEOUS | 0 refills | Status: DC

## 2020-10-18 ENCOUNTER — Telehealth: Payer: Self-pay | Admitting: Oncology

## 2020-10-18 LAB — T4: T4, Total: 6.9 ug/dL (ref 4.5–12.0)

## 2020-10-18 NOTE — Telephone Encounter (Signed)
Per 12/8 LOS, patient is scheduled for 11/14/2020 Labs, Follow Up Appt

## 2020-10-19 ENCOUNTER — Other Ambulatory Visit: Payer: Self-pay

## 2020-10-19 ENCOUNTER — Inpatient Hospital Stay: Payer: Medicare PPO

## 2020-10-19 VITALS — BP 116/55 | HR 71 | Temp 98.1°F | Resp 18 | Ht 68.0 in | Wt 177.0 lb

## 2020-10-19 DIAGNOSIS — Z5112 Encounter for antineoplastic immunotherapy: Secondary | ICD-10-CM | POA: Diagnosis not present

## 2020-10-19 DIAGNOSIS — Z79899 Other long term (current) drug therapy: Secondary | ICD-10-CM | POA: Diagnosis not present

## 2020-10-19 DIAGNOSIS — C4339 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: Secondary | ICD-10-CM

## 2020-10-19 DIAGNOSIS — D509 Iron deficiency anemia, unspecified: Secondary | ICD-10-CM | POA: Diagnosis not present

## 2020-10-19 DIAGNOSIS — N189 Chronic kidney disease, unspecified: Secondary | ICD-10-CM | POA: Diagnosis not present

## 2020-10-19 DIAGNOSIS — I130 Hypertensive heart and chronic kidney disease with heart failure and stage 1 through stage 4 chronic kidney disease, or unspecified chronic kidney disease: Secondary | ICD-10-CM | POA: Diagnosis not present

## 2020-10-19 DIAGNOSIS — Z950 Presence of cardiac pacemaker: Secondary | ICD-10-CM | POA: Diagnosis not present

## 2020-10-19 DIAGNOSIS — Z7984 Long term (current) use of oral hypoglycemic drugs: Secondary | ICD-10-CM | POA: Diagnosis not present

## 2020-10-19 DIAGNOSIS — E538 Deficiency of other specified B group vitamins: Secondary | ICD-10-CM | POA: Diagnosis not present

## 2020-10-19 DIAGNOSIS — C4330 Malignant melanoma of unspecified part of face: Secondary | ICD-10-CM | POA: Diagnosis not present

## 2020-10-19 MED ORDER — SODIUM CHLORIDE 0.9 % IV SOLN
480.0000 mg | Freq: Once | INTRAVENOUS | Status: AC
  Administered 2020-10-19: 480 mg via INTRAVENOUS
  Filled 2020-10-19: qty 48

## 2020-10-19 MED ORDER — CYANOCOBALAMIN 1000 MCG/ML IJ SOLN
INTRAMUSCULAR | Status: AC
  Filled 2020-10-19: qty 1

## 2020-10-19 MED ORDER — ALTEPLASE 2 MG IJ SOLR
2.0000 mg | Freq: Once | INTRAMUSCULAR | Status: DC | PRN
  Filled 2020-10-19: qty 2

## 2020-10-19 MED ORDER — HEPARIN SOD (PORK) LOCK FLUSH 100 UNIT/ML IV SOLN
500.0000 [IU] | Freq: Once | INTRAVENOUS | Status: AC | PRN
  Administered 2020-10-19: 500 [IU]
  Filled 2020-10-19: qty 5

## 2020-10-19 MED ORDER — SODIUM CHLORIDE 0.9 % IV SOLN
Freq: Once | INTRAVENOUS | Status: AC
  Filled 2020-10-19: qty 250

## 2020-10-19 MED ORDER — CYANOCOBALAMIN 1000 MCG/ML IJ SOLN
1000.0000 ug | Freq: Once | INTRAMUSCULAR | Status: AC
Start: 2020-10-19 — End: 2020-10-19
  Administered 2020-10-19: 1000 ug via INTRAMUSCULAR

## 2020-10-19 NOTE — Patient Instructions (Signed)
[place] [facility] Discharge Instructions for Patients Receiving Chemotherapy  Today you received the following chemotherapy agents Nivolumab  To help prevent nausea and vomiting after your treatment, we encourage you to take your nausea medication as directed.   If you develop nausea and vomiting that is not controlled by your nausea medication, call the clinic.   BELOW ARE SYMPTOMS THAT SHOULD BE REPORTED IMMEDIATELY:  *FEVER GREATER THAN 100.5 F  *CHILLS WITH OR WITHOUT FEVER  NAUSEA AND VOMITING THAT IS NOT CONTROLLED WITH YOUR NAUSEA MEDICATION  *UNUSUAL SHORTNESS OF BREATH  *UNUSUAL BRUISING OR BLEEDING  TENDERNESS IN MOUTH AND THROAT WITH OR WITHOUT PRESENCE OF ULCERS  *URINARY PROBLEMS  *BOWEL PROBLEMS  UNUSUAL RASH Items with * indicate a potential emergency and should be followed up as soon as possible.  Feel free to call the clinic should you have any questions or concerns at The clinic phone number is [phone].  Please show the [place] at check-in to the Emergency Department and triage nurse.

## 2020-10-19 NOTE — Progress Notes (Signed)
PT STABLE AT TIME OF DISCHARGE 

## 2020-11-07 ENCOUNTER — Ambulatory Visit: Payer: Medicare PPO

## 2020-11-12 NOTE — Progress Notes (Incomplete)
[facility]  [address] [place],  [place]  [address] [phone]  Clinic Day:  11/12/2020  Referring physician: [name], PA-C   This document serves as a record of services personally performed by [name], MD. It was created on their behalf by [name],[name], a trained medical scribe. The creation of this record is based on the scribe's personal observations and the provider's statements to them.   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 He is here for continued follow up with maintenance nivolumab  Current Treatment:  Nivolumab every 4 weeks   HISTORY OF PRESENT ILLNESS:  Kevin [name] is a 80 y.o. 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  His original lesion was diagnosed in March 2016.  He underwent excision, but refused aggressive surgery.  The lesion was at least 4 mm thick with a Clark's level 4, and a mitotic index every 10/mm.  The margins were positive, but reexcision revealed no residual melanoma.  He had recurrence within 2 months and the lesion had been steadily growing.  He initially had refused seeing a medical oncologist, but finally came to [place] for evaluation and treatment.  He does have extensive comorbidities including COPD, diabetes, hypertension, coronary artery disease, history of myocardial infarction, and pacemaker with AICD.  He had a PET, which showed hypermetabolism in the right submandibular node, but no evidence of distant metastasis.  He was having significant pain and requiring regular hydrocodone doses.  He also has severe disfigurement and avoided going out in public because of this.  He has been receiving palliative nivolumab since November 2016 and has had a good response with a decrease in the right facial tumor.  He has had associated hypomagnesemia and hypokalemia, so is on oral magnesium and potassium  supplements, and has also intermittently required IV magnesium and potassium supplementation.  He has had mild chronic anemia, which has been stable.  He was found to have B12 deficiency and continues B12 injections monthly.  CT head/neck in May 2017 revealed some nonspecific skin thickening in the right face in the area of his melanoma in the right submandibular lymph node had decreased in size, now measuring 6 x 12 mm, but still prominent when compared to the left submandibular node.  He had 15 cycles of nivolumab and relocated to continue to get the same treatment.  He had his 30th dose in February 2018 and then returned to the [place] area.  However, he was admitted to the hospital in early March 2018 for congestive heart failure associated with pleural and pericardial effusions.  He also had changes in the lung and it was unclear as to how much of this was fluid overload versus infection versus pneumonitis from the immunotherapy.  He had no leukocytosis, fever or purulent sputum.  The echocardiogram revealed severe diffuse hypokinesis with mild concentric hypertrophy and markedly depressed ejection fraction of 20 to 25%, associated with moderate mitral regurgitation and moderate left atrial dilatation.  He improved with diuresis.  The pleural effusions were felt to be most likely secondary to congestive heart failure.  He was also placed on corticosteroids due to the possibility of side effects of immunotherapy, but these were subsequently discontinued.  CT chest in March 2018 revealed stable cardiomegaly and stable small pericardial effusion, but no evidence of immune mediated pneumonitis or metastatic disease.  The previously seen bilateral  pleural effusions had resolved.  Nivolumab was resumed in April.  CT chest was repeated in early May after 1 month back on therapy.  This was stable, without evidence of recurrent pleural effusions, immunotherapy toxicities, or evidence of metastatic disease.  He was  switched to every 28-day nivolumab in August 2018.  He has had stable disease so has continued on nivolumab.  He remains on both furosemide and parenteral lactone.  CT imaging in October 2018 revealed a stable 5 mm level 1B cervical lymph node, as well as a stable 8 mm exophytic lesion of the right face.  CT chest did not reveal any evidence of metastatic disease.  Borderline cardiomegaly and a small amount of pericardial fluid/thickening was seen, but not reaccumulation of the pleural effusions.  CT head did not reveal any evidence of intracranial metastasis.  There were chronic ischemic changes and evidence of old infarcts.  He was continued with palliative nivolumab.  Repeat CT neck and chest in February 2019 were stable.  When we saw him in March, he had weight gain and increased edema, so we increased his furosemide 40 mg to 1-1/2 tablets daily and requested he follow-up with his primary care provider.  He was admitted to [facility] in May 2019 twice with severe hypoglycemia.  Repeat imaging in May did not reveal any progressive disease.  His nivolumab was held in May, but resumed at the end of June.  He did not have a dose in July.  Unfortunately, his wife expired in July 2019 and she was his primary caregiver.  His daughter has been caring for him since.  CT neck and chest in November 2019 did not reveal any evidence of recurrent or metastatic disease.  CT imaging in January 2020 did not reveal evidence of recurrence, so he has continued nivolumab every 4 weeks.  He was given mirtazapine for depression and weight loss the dose was increased to 15 mg.  CT scans in June remained stable with a small pericardial effusion and bilateral renal cysts.   CT head, neck and chest from October 1 was negative for evidence of metastatic disease.   INTERVAL HISTORY:  He is here for follow up prior to a 4th cycle of nivolumab.  He states that he has been doing well, and denies complaints other than intermittent ankle  and foot swelling.  This resolves by morning.  He also notes itching of the skin.  He was given Mycostatin powder but 15 g cost him $39 dollars, so I will try him on Mycostatin cream instead.  His hemoglobin has decreased from 12.0 to 11.3, and his white count and platelets are normal.  Chemistries are unremarkable.  His  appetite is good, and he has gained 1 and 1/2 pounds since his last visit.  He denies fever, chills or other signs of infection.  He denies nausea, vomiting, bowel issues, or abdominal pain.  He denies sore throat, cough, dyspnea, or chest pain.  He is here for follow up prior to a 5th cycle of nivolumab.   His  appetite is good, and he has gained/lost _ pounds since his last visit.  He denies fever, chills or other signs of infection.  He denies nausea, vomiting, bowel issues, or abdominal pain.  He denies sore throat, cough, dyspnea, or chest pain.  REVIEW OF SYSTEMS:  Review of Systems - Oncology   VITALS:  There were no vitals taken for this visit.  Wt Readings from Last 3 Encounters:  10/19/20 177  lb (80.3 kg)  10/17/20 177 lb 9.6 oz (80.6 kg)  08/15/20 170 lb 1 oz (77.1 kg)    There is no height or weight on file to calculate BMI.  Performance status (ECOG): 1 - Symptomatic but completely ambulatory  PHYSICAL EXAM:  Physical ExamLymph nodes:   There is no cervical, clavicular, axillary or lymphadenopathy.   LABS:   CBC Latest Ref Rng & Units 10/17/2020 09/20/2020 08/15/2020  WBC - 4.9 4.8 4.3  Hemoglobin 13.5 - 17.5 11.3(A) 11.2(L) 12.0(A)  Hematocrit 41 - 53 34(A) 35.6(L) 37(A)  Platelets 150 - 399 190 183 196   CMP Latest Ref Rng & Units 10/17/2020 09/20/2020 08/15/2020  Glucose 70 - 99 mg/dL - 116(H) -  BUN 4 - 21 20 24(H) 26(A)  Creatinine 0.6 - 1.3 1.2 1.17 1.2  Sodium 137 - 147 139 139 141  Potassium 3.4 - 5.3 4.5 4.8 4.4  Chloride 99 - 108 103 104 107  CO2 13 - 22 28(A) 25 24(A)  Calcium 8.7 - 10.7 9.2 9.2 8.8  Total Protein 6.5 - 8.1 g/dL - 7.4 -   Total Bilirubin 0.3 - 1.2 mg/dL - 0.6 -  Alkaline Phos 25 - 125 58 51 63  AST 14 - 40 18 15 18   ALT 10 - 40 11 13 12      STUDIES:  No results found.    HISTORY:   Allergies: No Known Allergies  Current Medications: Current Outpatient Medications  Medication Sig Dispense Refill  . atorvastatin (LIPITOR) 80 MG tablet Take 80 mg by mouth at bedtime.    . budesonide-formoterol (SYMBICORT) 80-4.5 MCG/ACT inhaler Inhale 2 puffs into the lungs 2 (two) times daily.    . clotrimazole-betamethasone (LOTRISONE) cream Apply 1 application topically 2 (two) times daily. 30 g 0  . Ferrous Fumarate (HEMOCYTE - 106 MG FE) 324 (106 Fe) MG TABS tablet Take 1 tablet by mouth.    . furosemide (LASIX) 40 MG tablet Take 40 mg by mouth daily.    Marland Kitchen lisinopril (ZESTRIL) 10 MG tablet Take 10 mg by mouth daily.    . magnesium oxide (MAG-OX) 400 (241.3 Mg) MG tablet Take 400 mg by mouth 2 (two) times daily.    . metFORMIN (GLUCOPHAGE) 500 MG tablet Take 1,000 mg by mouth 2 (two) times daily with a meal.    . mirtazapine (REMERON) 15 MG tablet Take 15 mg by mouth at bedtime.    Marland Kitchen nystatin (MYCOSTATIN/NYSTOP) powder Apply 1 application topically 3 (three) times daily. 15 g 0  . ondansetron (ZOFRAN) 4 MG tablet Take 1 tablet (4 mg total) by mouth every 4 (four) hours as needed for nausea. 90 tablet 3  . ondansetron (ZOFRAN-ODT) 4 MG disintegrating tablet Take by mouth at bedtime.    . pioglitazone (ACTOS) 30 MG tablet Take 30 mg by mouth daily.    . polyethylene glycol (MIRALAX / GLYCOLAX) 17 g packet Take 17 g by mouth daily.    . prochlorperazine (COMPAZINE) 10 MG tablet Take 10 mg by mouth every 6 (six) hours as needed for nausea or vomiting.    . sitaGLIPtin (JANUVIA) 100 MG tablet Take 100 mg by mouth daily.    Marland Kitchen spironolactone (ALDACTONE) 25 MG tablet Take 25 mg by mouth daily.    Marland Kitchen triamcinolone (KENALOG) 0.025 % cream Apply 1 application topically 2 (two) times daily.     No current  facility-administered medications for this visit.     ASSESSMENT & PLAN:   Assessment:  1. Recurrent/metastatic melanoma, which remains controlled with palliative nivolumab.   2. B12 deficiency, for which he continues monthly B12 injections.  3. Chronic kidney disease, which now is in a normal range.  4. Congestive heart failure and pacemaker.  5. 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  He continues oral iron supplement daily.   6. Mild hypomagnesemia, resolved with oral supplement twice daily, which he will continue.  7. Tobacco abuse, we discussed the importance of complete abstinence from tobacco.  The patient is not motivated to quit at this time.    Plan: He will proceed with a 5th cycle of nivolumab and B12 injection on January 7th.  He knows to continue oral iron and magnesium supplements daily.  We will see him back in 4 weeks for repeat CBC, CMP, magnesium, TSH and evaluation prior to his next cycle of nivolumab.  The patient understands the plans discussed today and is in agreement with them.  The patient knows to contact our office if his develops concerns prior to his next appointment.   I provided 20 minutes of face-to-face time during this this encounter and > 50% was spent counseling as documented under my assessment and plan.    Dellia Beckwith, MD [facility] AT [facility] [address] [place] [address] Dept: [phone] Dept Fax: [phone]   I, Foye Deer, am acting as scribe for Dellia Beckwith, MD  I have reviewed this report as typed by the medical scribe, and it is complete and accurate.

## 2020-11-14 ENCOUNTER — Inpatient Hospital Stay: Payer: Medicare PPO

## 2020-11-14 ENCOUNTER — Inpatient Hospital Stay: Payer: Medicare PPO | Admitting: Oncology

## 2020-11-15 DIAGNOSIS — Z20822 Contact with and (suspected) exposure to covid-19: Secondary | ICD-10-CM | POA: Diagnosis not present

## 2020-11-16 ENCOUNTER — Inpatient Hospital Stay: Payer: Medicare PPO

## 2020-11-20 DIAGNOSIS — Z6826 Body mass index (BMI) 26.0-26.9, adult: Secondary | ICD-10-CM | POA: Diagnosis not present

## 2020-11-20 DIAGNOSIS — Z23 Encounter for immunization: Secondary | ICD-10-CM | POA: Diagnosis not present

## 2020-11-20 DIAGNOSIS — S41111A Laceration without foreign body of right upper arm, initial encounter: Secondary | ICD-10-CM | POA: Diagnosis not present

## 2020-11-20 NOTE — Progress Notes (Signed)
Renewed Application for Patient through the [facility]. 11/23/2019-08/21/2021 Member ID [id number]

## 2021-02-06 ENCOUNTER — Inpatient Hospital Stay: Payer: Medicare PPO | Attending: Oncology

## 2021-02-06 ENCOUNTER — Telehealth: Payer: Self-pay | Admitting: Oncology

## 2021-02-06 NOTE — Telephone Encounter (Signed)
Called patient to reschedule last Canceled Appt due to patient being sick.  She stated he is better now and is ready to schedule an Appt.  Appt made for 4/1 Labs 3:00 pm, Follow Up 3:30 pm  Do we need to reschedule Infusion now or wait until you see him on 4/1?

## 2021-02-07 ENCOUNTER — Other Ambulatory Visit: Payer: Self-pay | Admitting: Pharmacist

## 2021-02-07 NOTE — Progress Notes (Signed)
Kevin [name]  [address] [place],  [place]  [id number] [phone]  Clinic Day:  02/08/2021  Referring physician: [name], PA-C   This document serves as a record of services personally performed by [name], MD. It was created on their behalf by [name],[name], a trained medical scribe. The creation of this record is based on the scribe's personal observations and the provider's statements to them.   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 He is here for continued follow up with maintenance nivolumab  Current Treatment:  Nivolumab every 4 weeks   HISTORY OF PRESENT ILLNESS:  Kevin [name] is a 80 y.o. 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  His original lesion was diagnosed in March 2016.  He underwent excision, but refused aggressive surgery.  The lesion was at least 4 mm thick with a Clark's level 4, and a mitotic index every 10/mm.  The margins were positive, but reexcision revealed no residual melanoma.  He had recurrence within 2 months and the lesion had been steadily growing.  He initially had refused seeing a medical oncologist, but finally came to [place] for evaluation and treatment.  He does have extensive comorbidities including COPD, diabetes, hypertension, coronary artery disease, history of myocardial infarction, and pacemaker with AICD.  He had a PET, which showed hypermetabolism in the right submandibular node, but no evidence of distant metastasis.  He was having significant pain and requiring regular hydrocodone doses.  He also has severe disfigurement and avoided going out in public because of this.  He has been receiving palliative nivolumab since November 2016 and has had a good response with a decrease in the right facial tumor.  He has had associated hypomagnesemia and hypokalemia, so is on oral magnesium and potassium  supplements, and has also intermittently required IV magnesium and potassium supplementation.  He has had mild chronic anemia, which has been stable.  He was found to have B12 deficiency and continues B12 injections monthly.  CT head/neck in May 2017 revealed some nonspecific skin thickening in the right face in the area of his melanoma in the right submandibular lymph node had decreased in size, now measuring 6 x 12 mm, but still prominent when compared to the left submandibular node.  He had 15 cycles of nivolumab and relocated to continue to get the same treatment.  He had his 30th dose in February 2018 and then returned to the [place] area.  However, he was admitted to the hospital in early March 2018 for congestive heart failure associated with pleural and pericardial effusions.  He also had changes in the lung and it was unclear as to how much of this was fluid overload versus infection versus pneumonitis from the immunotherapy.  He had no leukocytosis, fever or purulent sputum.  The echocardiogram revealed severe diffuse hypokinesis with mild concentric hypertrophy and markedly depressed ejection fraction of 20 to 25%, associated with moderate mitral regurgitation and moderate left atrial dilatation.  He improved with diuresis.  The pleural effusions were felt to be most likely secondary to congestive heart failure.  He was also placed on corticosteroids due to the possibility of side effects of immunotherapy, but these were subsequently discontinued.  CT chest in March 2018 revealed stable cardiomegaly and stable small pericardial effusion, but no evidence of immune mediated pneumonitis or metastatic disease.  The previously seen bilateral  pleural effusions had resolved.  Nivolumab was resumed in April.  CT chest was repeated in early May after 1 month back on therapy.  This was stable, without evidence of recurrent pleural effusions, immunotherapy toxicities, or evidence of metastatic disease.  He was  switched to every 28-day nivolumab in August 2018.  He has had stable disease so has continued on nivolumab.  He remains on both furosemide and parenteral lactone.  CT imaging in October 2018 revealed a stable 5 mm level 1B cervical lymph node, as well as a stable 8 mm exophytic lesion of the right face.  CT chest did not reveal any evidence of metastatic disease.  Borderline cardiomegaly and a small amount of pericardial fluid/thickening was seen, but not reaccumulation of the pleural effusions.  CT head did not reveal any evidence of intracranial metastasis.  There were chronic ischemic changes and evidence of old infarcts.  He was continued with palliative nivolumab.  Repeat CT neck and chest in February 2019 were stable.  When we saw him in March, he had weight gain and increased edema, so we increased his furosemide 40 mg to 1-1/2 tablets daily and requested he follow-up with his primary care provider.  He was admitted to [facility] in May 2019 twice with severe hypoglycemia.  Repeat imaging in May did not reveal any progressive disease.  His nivolumab was held in May, but resumed at the end of June.  He did not have a dose in July.  Unfortunately, his wife expired in July 2019 and she was his primary caregiver.  His daughter has been caring for him since.  CT neck and chest in November 2019 did not reveal any evidence of recurrent or metastatic disease.  CT imaging in January 2020 did not reveal evidence of recurrence, so he has continued nivolumab every 4 weeks.  He was given mirtazapine for depression and weight loss the dose was increased to 15 mg.  CT scans in June remained stable with a small pericardial effusion and bilateral renal cysts.   CT head, neck and chest from October 1 was negative for evidence of metastatic disease.  INTERVAL HISTORY:  Kevin [name] is here for follow up prior to another cycle of nivolumab. His last nivolumab was December 10th and has not followed up since that time.  He states  that he did have a really bad cold earlier this year, but has otherwise been well.  He denies complaints today other than dizziness when getting up and occasional right chest pains.  He denies any new developing skin lesions.  His hemoglobin is stable to mildly improved at 11.6, and his white count and platelets are normal.  Chemistries are unremarkable except for a BUN of 21.  His  appetite is fair, and he has lost 11 pounds since his last visit.  He denies fever, chills or other signs of infection.  He denies nausea, vomiting, bowel issues, or abdominal pain.  He denies sore throat, cough, dyspnea, or chest pain.  REVIEW OF SYSTEMS:  Review of Systems  Constitutional: Negative.  Negative for appetite change, chills, fatigue, fever and unexpected weight change.  HENT:  Negative.   Eyes: Negative.   Respiratory: Negative.  Negative for chest tightness, cough, hemoptysis, shortness of breath and wheezing.   Cardiovascular: Positive for chest pain (right sided, only on occasion). Negative for leg swelling and palpitations.  Gastrointestinal: Negative.  Negative for abdominal distention, abdominal pain, blood in stool, constipation, diarrhea, nausea and vomiting.  Endocrine: Negative.  Genitourinary: Negative.  Negative for difficulty urinating, dysuria, frequency and hematuria.   Musculoskeletal: Negative.  Negative for arthralgias, back pain, flank pain, gait problem and myalgias.  Skin: Negative.   Neurological: Positive for dizziness (upon getting up ). Negative for extremity weakness, gait problem, headaches, light-headedness, numbness, seizures and speech difficulty.  Hematological: Bruises/bleeds easily.  Psychiatric/Behavioral: Negative.  Negative for depression and sleep disturbance. The patient is not nervous/anxious.   All other systems reviewed and are negative.    VITALS:  Blood pressure (!) 128/56, pulse 79, temperature (!) 97.5 F (36.4 C), temperature source Oral, resp. rate 18,  height 5\' 8"  (1.727 m), weight 166 lb 4.8 oz (75.4 kg), SpO2 96 %.  Wt Readings from Last 3 Encounters:  02/08/21 166 lb 4.8 oz (75.4 kg)  10/19/20 177 lb (80.3 kg)  10/17/20 177 lb 9.6 oz (80.6 kg)    Body mass index is 25.29 kg/m.  Performance status (ECOG): 1 - Symptomatic but completely ambulatory  PHYSICAL EXAM:  Physical Exam Constitutional:      General: He is not in acute distress.    Appearance: Normal appearance. He is normal weight.  HENT:     Head: Normocephalic and atraumatic.  Eyes:     General: No scleral icterus.    Extraocular Movements: Extraocular movements intact.     Conjunctiva/sclera: Conjunctivae normal.     Pupils: Pupils are equal, round, and reactive to light.  Cardiovascular:     Rate and Rhythm: Normal rate and regular rhythm.     Pulses: Normal pulses.     Heart sounds: Normal heart sounds. No murmur heard. No friction rub. No gallop.   Pulmonary:     Effort: Pulmonary effort is normal. No respiratory distress.     Breath sounds: Normal breath sounds.  Abdominal:     General: Bowel sounds are normal. There is no distension.     Palpations: Abdomen is soft. There is no hepatomegaly, splenomegaly or mass.     Tenderness: There is no abdominal tenderness.  Musculoskeletal:        General: Normal range of motion.     Cervical back: Normal range of motion and neck supple.     Right lower leg: Edema (trace) present.     Left lower leg: 1+ Edema present.     Comments: Calves are non-tender and there are no palpable cords.  Negative Homan's sign  Lymphadenopathy:     Cervical: No cervical adenopathy.  Skin:    General: Skin is warm and dry.     Comments: Ecchymosis of the hands  Neurological:     General: No focal deficit present.     Mental Status: He is alert and oriented to person, place, and time. Mental status is at baseline.  Psychiatric:        Mood and Affect: Mood normal.        Behavior: Behavior normal.        Thought Content:  Thought content normal.        Judgment: Judgment normal.   Lymph nodes:   There is no cervical, clavicular, axillary or lymphadenopathy.   LABS:   CBC Latest Ref Rng & Units 02/08/2021 10/17/2020 09/20/2020  WBC - 5.0 4.9 4.8  Hemoglobin 13.5 - 17.5 11.6(A) 11.3(A) 11.2(L)  Hematocrit 41 - 53 35(A) 34(A) 35.6(L)  Platelets 150 - 399 186 190 183   CMP Latest Ref Rng & Units 02/08/2021 10/17/2020 09/20/2020  Glucose 70 - 99 mg/dL - - 116(H)  BUN 4 - 21 21 20  24(H)  Creatinine 0.6 - 1.3 1.0 1.2 1.17  Sodium 137 - 147 138 139 139  Potassium 3.4 - 5.3 3.7 4.5 4.8  Chloride 99 - 108 104 103 104  CO2 13 - 22 26(A) 28(A) 25  Calcium 8.7 - 10.7 9.2 9.2 9.2  Total Protein 6.5 - 8.1 g/dL - - 7.4  Total Bilirubin 0.3 - 1.2 mg/dL - - 0.6  Alkaline Phos 25 - 125 65 58 51  AST 14 - 40 18 18 15   ALT 10 - 40 12 11 13      STUDIES:  No results found.    HISTORY:   Allergies: No Known Allergies  Current Medications: Current Outpatient Medications  Medication Sig Dispense Refill  . atorvastatin (LIPITOR) 80 MG tablet Take 80 mg by mouth at bedtime.    . budesonide-formoterol (SYMBICORT) 80-4.5 MCG/ACT inhaler Inhale 2 puffs into the lungs 2 (two) times daily.    . clotrimazole-betamethasone (LOTRISONE) cream Apply 1 application topically 2 (two) times daily. 30 g 0  . Ferrous Fumarate (HEMOCYTE - 106 MG FE) 324 (106 Fe) MG TABS tablet Take 1 tablet by mouth.    . furosemide (LASIX) 40 MG tablet Take 40 mg by mouth daily.    Marland Kitchen glimepiride (AMARYL) 4 MG tablet Take by mouth.    Marland Kitchen lisinopril (ZESTRIL) 10 MG tablet Take 10 mg by mouth daily.    . magnesium oxide (MAG-OX) 400 (241.3 Mg) MG tablet Take 400 mg by mouth 2 (two) times daily.    . metFORMIN (GLUCOPHAGE) 1000 MG tablet Take 1,000 mg by mouth 2 (two) times daily.    . metFORMIN (GLUCOPHAGE) 500 MG tablet Take 1,000 mg by mouth 2 (two) times daily with a meal.    . mirtazapine (REMERON) 15 MG tablet Take 15 mg by mouth at bedtime.     Marland Kitchen nystatin (MYCOSTATIN/NYSTOP) powder Apply 1 application topically 3 (three) times daily. 15 g 0  . ondansetron (ZOFRAN) 4 MG tablet Take 1 tablet (4 mg total) by mouth every 4 (four) hours as needed for nausea. 90 tablet 3  . ondansetron (ZOFRAN-ODT) 4 MG disintegrating tablet Take by mouth at bedtime.    . pioglitazone (ACTOS) 30 MG tablet Take 30 mg by mouth daily.    . polyethylene glycol (MIRALAX / GLYCOLAX) 17 g packet Take 17 g by mouth daily.    . prochlorperazine (COMPAZINE) 10 MG tablet Take 10 mg by mouth every 6 (six) hours as needed for nausea or vomiting.    . sitaGLIPtin (JANUVIA) 100 MG tablet Take 100 mg by mouth daily.    Marland Kitchen spironolactone (ALDACTONE) 25 MG tablet Take 25 mg by mouth daily.    Marland Kitchen triamcinolone (KENALOG) 0.025 % cream Apply 1 application topically 2 (two) times daily.     No current facility-administered medications for this visit.     ASSESSMENT & PLAN:   Assessment:   1. Recurrent/metastatic melanoma, which remains controlled with palliative nivolumab.   2. B12 deficiency, for which he continues monthly B12 injections.  3. Chronic kidney disease, which now is in a normal range.  4. Congestive heart failure and pacemaker.  5. 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  He continues oral iron supplement daily.   6. Mild hypomagnesemia, resolved with oral supplement twice daily, which he will continue.  7. Tobacco abuse, we discussed the importance of complete abstinence from tobacco.  The patient is not motivated to quit at this  time.    Plan: He will proceed with another cycle of nivolumab and B12 injection on April 5th.  He knows to continue oral iron and magnesium supplements daily.  We will see him back in 4 weeks for repeat CBC, CMP, magnesium, TSH and evaluation prior to his next cycle of nivolumab.  He will not need repeat imaging until November 2022.  The patient understands the plans discussed today and is in agreement with them.   The patient knows to contact our office if his develops concerns prior to his next appointment.   I provided 20 minutes of face-to-face time during this this encounter and > 50% was spent counseling as documented under my assessment and plan.    [name], MD [facility] [address] [place] [address] Dept: [phone] Dept Fax: [phone]   I, Rita Ohara, am acting as scribe for [name], MD  I have reviewed this report as typed by the medical scribe, and it is complete and accurate.

## 2021-02-08 ENCOUNTER — Other Ambulatory Visit: Payer: Self-pay | Admitting: Oncology

## 2021-02-08 ENCOUNTER — Encounter: Payer: Self-pay | Admitting: Oncology

## 2021-02-08 ENCOUNTER — Inpatient Hospital Stay: Payer: Medicare PPO | Attending: Oncology | Admitting: Oncology

## 2021-02-08 ENCOUNTER — Inpatient Hospital Stay: Payer: Medicare PPO

## 2021-02-08 ENCOUNTER — Other Ambulatory Visit: Payer: Self-pay | Admitting: Hematology and Oncology

## 2021-02-08 ENCOUNTER — Other Ambulatory Visit: Payer: Self-pay

## 2021-02-08 VITALS — BP 128/56 | HR 79 | Temp 97.5°F | Resp 18 | Ht 68.0 in | Wt 166.3 lb

## 2021-02-08 DIAGNOSIS — Z7984 Long term (current) use of oral hypoglycemic drugs: Secondary | ICD-10-CM | POA: Insufficient documentation

## 2021-02-08 DIAGNOSIS — E538 Deficiency of other specified B group vitamins: Secondary | ICD-10-CM | POA: Diagnosis not present

## 2021-02-08 DIAGNOSIS — C4339 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: Secondary | ICD-10-CM

## 2021-02-08 DIAGNOSIS — I2510 Atherosclerotic heart disease of native coronary artery without angina pectoris: Secondary | ICD-10-CM | POA: Diagnosis not present

## 2021-02-08 DIAGNOSIS — Z0001 Encounter for general adult medical examination with abnormal findings: Secondary | ICD-10-CM | POA: Diagnosis not present

## 2021-02-08 DIAGNOSIS — R42 Dizziness and giddiness: Secondary | ICD-10-CM | POA: Insufficient documentation

## 2021-02-08 DIAGNOSIS — I130 Hypertensive heart and chronic kidney disease with heart failure and stage 1 through stage 4 chronic kidney disease, or unspecified chronic kidney disease: Secondary | ICD-10-CM | POA: Diagnosis not present

## 2021-02-08 DIAGNOSIS — E1122 Type 2 diabetes mellitus with diabetic chronic kidney disease: Secondary | ICD-10-CM | POA: Diagnosis not present

## 2021-02-08 DIAGNOSIS — Z95810 Presence of automatic (implantable) cardiac defibrillator: Secondary | ICD-10-CM | POA: Insufficient documentation

## 2021-02-08 DIAGNOSIS — N189 Chronic kidney disease, unspecified: Secondary | ICD-10-CM | POA: Insufficient documentation

## 2021-02-08 DIAGNOSIS — Z79899 Other long term (current) drug therapy: Secondary | ICD-10-CM | POA: Insufficient documentation

## 2021-02-08 DIAGNOSIS — Z720 Tobacco use: Secondary | ICD-10-CM | POA: Insufficient documentation

## 2021-02-08 DIAGNOSIS — D649 Anemia, unspecified: Secondary | ICD-10-CM | POA: Diagnosis not present

## 2021-02-08 DIAGNOSIS — J449 Chronic obstructive pulmonary disease, unspecified: Secondary | ICD-10-CM | POA: Diagnosis not present

## 2021-02-08 DIAGNOSIS — I252 Old myocardial infarction: Secondary | ICD-10-CM | POA: Insufficient documentation

## 2021-02-08 DIAGNOSIS — D509 Iron deficiency anemia, unspecified: Secondary | ICD-10-CM | POA: Diagnosis not present

## 2021-02-08 DIAGNOSIS — Z5112 Encounter for antineoplastic immunotherapy: Secondary | ICD-10-CM | POA: Insufficient documentation

## 2021-02-08 DIAGNOSIS — Z7951 Long term (current) use of inhaled steroids: Secondary | ICD-10-CM | POA: Diagnosis not present

## 2021-02-08 LAB — TSH: TSH: 1.876 u[IU]/mL (ref 0.350–4.500)

## 2021-02-08 LAB — CBC AND DIFFERENTIAL
HCT: 35 — AB (ref 41–53)
Hemoglobin: 11.6 — AB (ref 13.5–17.5)
Neutrophils Absolute: 3.5
Platelets: 186 (ref 150–399)
WBC: 5

## 2021-02-08 LAB — HEPATIC FUNCTION PANEL
ALT: 12 (ref 10–40)
AST: 18 (ref 14–40)
Alkaline Phosphatase: 65 (ref 25–125)
Bilirubin, Total: 0.4

## 2021-02-08 LAB — BASIC METABOLIC PANEL
BUN: 21 (ref 4–21)
CO2: 26 — AB (ref 13–22)
Chloride: 104 (ref 99–108)
Creatinine: 1 (ref 0.6–1.3)
Glucose: 184
Potassium: 3.7 (ref 3.4–5.3)
Sodium: 138 (ref 137–147)

## 2021-02-08 LAB — CBC: RBC: 3.78 — AB (ref 3.87–5.11)

## 2021-02-08 LAB — COMPREHENSIVE METABOLIC PANEL
Albumin: 4.6 (ref 3.5–5.0)
Calcium: 9.2 (ref 8.7–10.7)

## 2021-02-11 ENCOUNTER — Inpatient Hospital Stay: Payer: Medicare PPO

## 2021-02-11 ENCOUNTER — Other Ambulatory Visit: Payer: Self-pay

## 2021-02-11 VITALS — BP 121/55 | HR 72 | Temp 97.5°F | Resp 18 | Ht 68.0 in | Wt 165.2 lb

## 2021-02-11 DIAGNOSIS — E1122 Type 2 diabetes mellitus with diabetic chronic kidney disease: Secondary | ICD-10-CM | POA: Diagnosis not present

## 2021-02-11 DIAGNOSIS — C4339 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: Secondary | ICD-10-CM

## 2021-02-11 DIAGNOSIS — J449 Chronic obstructive pulmonary disease, unspecified: Secondary | ICD-10-CM | POA: Diagnosis not present

## 2021-02-11 DIAGNOSIS — I130 Hypertensive heart and chronic kidney disease with heart failure and stage 1 through stage 4 chronic kidney disease, or unspecified chronic kidney disease: Secondary | ICD-10-CM | POA: Diagnosis not present

## 2021-02-11 DIAGNOSIS — Z5112 Encounter for antineoplastic immunotherapy: Secondary | ICD-10-CM | POA: Diagnosis not present

## 2021-02-11 DIAGNOSIS — E538 Deficiency of other specified B group vitamins: Secondary | ICD-10-CM | POA: Diagnosis not present

## 2021-02-11 DIAGNOSIS — D509 Iron deficiency anemia, unspecified: Secondary | ICD-10-CM | POA: Diagnosis not present

## 2021-02-11 DIAGNOSIS — R42 Dizziness and giddiness: Secondary | ICD-10-CM | POA: Diagnosis not present

## 2021-02-11 MED ORDER — CYANOCOBALAMIN 1000 MCG/ML IJ SOLN
1000.0000 ug | Freq: Once | INTRAMUSCULAR | Status: AC
  Administered 2021-02-11: 1000 ug via INTRAMUSCULAR

## 2021-02-11 MED ORDER — SODIUM CHLORIDE 0.9 % IV SOLN
480.0000 mg | Freq: Once | INTRAVENOUS | Status: AC
  Administered 2021-02-11: 480 mg via INTRAVENOUS
  Filled 2021-02-11: qty 48

## 2021-02-11 MED ORDER — HEPARIN SOD (PORK) LOCK FLUSH 100 UNIT/ML IV SOLN
500.0000 [IU] | Freq: Once | INTRAVENOUS | Status: AC | PRN
  Administered 2021-02-11: 500 [IU]
  Filled 2021-02-11: qty 5

## 2021-02-11 MED ORDER — SODIUM CHLORIDE 0.9 % IV SOLN
Freq: Once | INTRAVENOUS | Status: AC
  Filled 2021-02-11: qty 250

## 2021-02-11 MED ORDER — CYANOCOBALAMIN 1000 MCG/ML IJ SOLN
INTRAMUSCULAR | Status: AC
  Filled 2021-02-11: qty 1

## 2021-02-11 NOTE — Patient Instructions (Addendum)
[facility] Discharge Instructions for Patients Receiving Chemotherapy  Today you received the following chemotherapy agents Nivolumab  To help prevent nausea and vomiting after your treatment, we encourage you to take your nausea medication as directed.   If you develop nausea and vomiting that is not controlled by your nausea medication, call the clinic.   BELOW ARE SYMPTOMS THAT SHOULD BE REPORTED IMMEDIATELY:  *FEVER GREATER THAN 100.5 F  *CHILLS WITH OR WITHOUT FEVER  NAUSEA AND VOMITING THAT IS NOT CONTROLLED WITH YOUR NAUSEA MEDICATION  *UNUSUAL SHORTNESS OF BREATH  *UNUSUAL BRUISING OR BLEEDING  TENDERNESS IN MOUTH AND THROAT WITH OR WITHOUT PRESENCE OF ULCERS  *URINARY PROBLEMS  *BOWEL PROBLEMS  UNUSUAL RASH Items with * indicate a potential emergency and should be followed up as soon as possible.  Feel free to call the clinic should you have any questions or concerns at The clinic phone number is [phone].  Please show the [place] at check-in to the Emergency Department and triage nurse.  Cyanocobalamin, Vitamin B12 injection What is this medicine? CYANOCOBALAMIN (sye an oh koe BAL a min) is a man made form of vitamin B12. Vitamin B12 is used in the growth of healthy blood cells, nerve cells, and proteins in the body. It also helps with the metabolism of fats and carbohydrates. This medicine is used to treat people who can not absorb vitamin B12. This medicine may be used for other purposes; ask your health care provider or pharmacist if you have questions. COMMON BRAND NAME(S): B-12 Compliance Kit, B-12 Injection Kit, Cyomin, LA-12, Nutri-Twelve, Physicians EZ Use B-12, Primabalt What should I tell my health care provider before I take this medicine? They need to know if you have any of these conditions:  kidney disease  Leber's disease  megaloblastic anemia  an unusual or allergic reaction to cyanocobalamin, cobalt,  other medicines, foods, dyes, or preservatives  pregnant or trying to get pregnant  breast-feeding How should I use this medicine? This medicine is injected into a muscle or deeply under the skin. It is usually given by a health care professional in a clinic or doctor's office. However, your doctor may teach you how to inject yourself. Follow all instructions. Talk to your pediatrician regarding the use of this medicine in children. Special care may be needed. Overdosage: If you think you have taken too much of this medicine contact a poison control center or emergency room at once. NOTE: This medicine is only for you. Do not share this medicine with others. What if I miss a dose? If you are given your dose at a clinic or doctor's office, call to reschedule your appointment. If you give your own injections and you miss a dose, take it as soon as you can. If it is almost time for your next dose, take only that dose. Do not take double or extra doses. What may interact with this medicine?  colchicine  heavy alcohol intake This list may not describe all possible interactions. Give your health care provider a list of all the medicines, herbs, non-prescription drugs, or dietary supplements you use. Also tell them if you smoke, drink alcohol, or use illegal drugs. Some items may interact with your medicine. What should I watch for while using this medicine? Visit your doctor or health care professional regularly. You may need blood work done while you are taking this medicine. You may need to follow a special diet. Talk to your doctor. Limit your  alcohol intake and avoid smoking to get the best benefit. What side effects may I notice from receiving this medicine? Side effects that you should report to your doctor or health care professional as soon as possible:  allergic reactions like skin rash, itching or hives, swelling of the face, lips, or tongue  blue tint to skin  chest tightness,  pain  difficulty breathing, wheezing  dizziness  red, swollen painful area on the leg Side effects that usually do not require medical attention (report to your doctor or health care professional if they continue or are bothersome):  diarrhea  headache This list may not describe all possible side effects. Call your doctor for medical advice about side effects. You may report side effects to FDA at 1-800-FDA-1088. Where should I keep my medicine? Keep out of the reach of children. Store at room temperature between 15 and 30 degrees C (59 and 85 degrees F). Protect from light. Throw away any unused medicine after the expiration date. NOTE: This sheet is a summary. It may not cover all possible information. If you have questions about this medicine, talk to your doctor, pharmacist, or health care provider.  2021 Elsevier/Gold Standard (2008-02-07 22:10:20)

## 2021-02-11 NOTE — Progress Notes (Signed)
1515: PT STABLE AT TIME OF DISCHARGE  

## 2021-02-14 ENCOUNTER — Ambulatory Visit: Payer: Medicare PPO

## 2021-03-08 ENCOUNTER — Inpatient Hospital Stay: Payer: Medicare PPO

## 2021-03-08 ENCOUNTER — Inpatient Hospital Stay: Payer: Medicare PPO | Admitting: Hematology and Oncology

## 2021-03-11 ENCOUNTER — Inpatient Hospital Stay: Payer: Medicare PPO

## 2021-03-12 ENCOUNTER — Telehealth: Payer: Self-pay | Admitting: Hematology and Oncology

## 2021-03-12 ENCOUNTER — Inpatient Hospital Stay: Payer: Medicare PPO | Attending: Oncology

## 2021-03-12 ENCOUNTER — Encounter: Payer: Self-pay | Admitting: Hematology and Oncology

## 2021-03-12 ENCOUNTER — Inpatient Hospital Stay (INDEPENDENT_AMBULATORY_CARE_PROVIDER_SITE_OTHER): Payer: Medicare PPO | Admitting: Hematology and Oncology

## 2021-03-12 DIAGNOSIS — I110 Hypertensive heart disease with heart failure: Secondary | ICD-10-CM | POA: Diagnosis not present

## 2021-03-12 DIAGNOSIS — E538 Deficiency of other specified B group vitamins: Secondary | ICD-10-CM | POA: Diagnosis not present

## 2021-03-12 DIAGNOSIS — F17210 Nicotine dependence, cigarettes, uncomplicated: Secondary | ICD-10-CM | POA: Insufficient documentation

## 2021-03-12 DIAGNOSIS — Z7984 Long term (current) use of oral hypoglycemic drugs: Secondary | ICD-10-CM | POA: Insufficient documentation

## 2021-03-12 DIAGNOSIS — I5040 Unspecified combined systolic (congestive) and diastolic (congestive) heart failure: Secondary | ICD-10-CM | POA: Diagnosis not present

## 2021-03-12 DIAGNOSIS — E119 Type 2 diabetes mellitus without complications: Secondary | ICD-10-CM | POA: Diagnosis not present

## 2021-03-12 DIAGNOSIS — Z79899 Other long term (current) drug therapy: Secondary | ICD-10-CM | POA: Insufficient documentation

## 2021-03-12 DIAGNOSIS — I252 Old myocardial infarction: Secondary | ICD-10-CM | POA: Diagnosis not present

## 2021-03-12 DIAGNOSIS — D649 Anemia, unspecified: Secondary | ICD-10-CM | POA: Diagnosis not present

## 2021-03-12 DIAGNOSIS — C4339 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: Secondary | ICD-10-CM | POA: Diagnosis not present

## 2021-03-12 DIAGNOSIS — Z5112 Encounter for antineoplastic immunotherapy: Secondary | ICD-10-CM | POA: Diagnosis not present

## 2021-03-12 DIAGNOSIS — E876 Hypokalemia: Secondary | ICD-10-CM | POA: Diagnosis not present

## 2021-03-12 DIAGNOSIS — I2510 Atherosclerotic heart disease of native coronary artery without angina pectoris: Secondary | ICD-10-CM | POA: Insufficient documentation

## 2021-03-12 DIAGNOSIS — J449 Chronic obstructive pulmonary disease, unspecified: Secondary | ICD-10-CM | POA: Diagnosis not present

## 2021-03-12 LAB — BASIC METABOLIC PANEL
BUN: 17 (ref 4–21)
CO2: 25 — AB (ref 13–22)
Chloride: 103 (ref 99–108)
Creatinine: 0.9 (ref 0.6–1.3)
Glucose: 167
Potassium: 5.1 (ref 3.4–5.3)
Sodium: 136 — AB (ref 137–147)

## 2021-03-12 LAB — HEPATIC FUNCTION PANEL
ALT: 12 (ref 10–40)
AST: 17 (ref 14–40)
Alkaline Phosphatase: 66 (ref 25–125)
Bilirubin, Total: 0.7

## 2021-03-12 LAB — CBC AND DIFFERENTIAL
HCT: 38 — AB (ref 41–53)
Hemoglobin: 12.3 — AB (ref 13.5–17.5)
Neutrophils Absolute: 3.31
Platelets: 196 (ref 150–399)
WBC: 4.8

## 2021-03-12 LAB — CBC
MCV: 96 — AB (ref 80–94)
RBC: 4.02 (ref 3.87–5.11)

## 2021-03-12 LAB — COMPREHENSIVE METABOLIC PANEL WITH GFR
Albumin: 4.4 (ref 3.5–5.0)
Calcium: 9.3 (ref 8.7–10.7)

## 2021-03-12 LAB — TSH: TSH: 1.814 u[IU]/mL (ref 0.350–4.500)

## 2021-03-12 NOTE — Telephone Encounter (Signed)
Per 5/3 LOS, patient scheduled for 5/31 Labs, Follow Up - 6/1 Injections.  Gave patient Appt Summary

## 2021-03-12 NOTE — Progress Notes (Signed)
[facility]  [address] [place],  [place]  [address] [phone]  Clinic Day:  03/12/2021  Referring physician: [name], PA-C   CHIEF COMPLAINT:  CC:   Recurrent melanoma of the right face and B12 deficiency  Current Treatment:   Maintenance nivolumab every 4 weeks, as well as B12 every 4 weeks   HISTORY OF PRESENT ILLNESS:  Kevin [name] is a 80 y.o. 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  His original lesion was diagnosed in March 2016.  He underwent excision, but refused aggressive surgery.  The lesion was at least 4 mm thick with a Clark's level 4, and a mitotic index every 10/mm.  The margins were positive, but reexcision revealed no residual melanoma.  He had recurrence within 2 months and the lesion had been steadily growing.  He initially had refused seeing a medical oncologist, but finally came to [place] for evaluation and treatment in October 2016.  He does have extensive comorbidities including COPD, diabetes, hypertension, coronary artery disease, history of myocardial infarction, and pacemaker with AICD.  PET revealed hypermetabolism in the right submandibular node, but no evidence of distant metastasis.  He was having significant pain and requiring regular hydrocodone doses.  He also had severe disfigurement and avoided going out in public because of the facial lession.  He has been receiving palliative nivolumab since November 2016 and has had a good response with a decrease in the right facial tumor.  He has had associated hypomagnesemia and hypokalemia, so is on oral magnesium and potassium supplements. He has also intermittently required IV magnesium and potassium supplementation.  He has had mild chronic anemia, which has been stable. He was found to have B12 deficiency and continues B12 injections monthly.  CT head/neck in May 2017 revealed some nonspecific skin thickening in the right face in the area of his  melanoma in the right submandibular lymph node had decreased in size, now measuring 6 x 12 mm, but still prominent when compared to the left submandibular node.  He had 15 cycles of nivolumab and relocated to continue to get the same treatment.  He had his 30th dose in February 2018 and then returned to the [place] area.    However, he was admitted to the hospital in early March 2018 for congestive heart failure associated with pleural and pericardial effusions.  He also had changes in the lung and it was unclear as to how much of this was fluid overload versus infection versus pneumonitis from the immunotherapy.  He had no leukocytosis, fever or purulent sputum.  The echocardiogram revealed severe diffuse hypokinesis with mild concentric hypertrophy and markedly depressed ejection fraction of 20 to 25%, associated with moderate mitral regurgitation and moderate left atrial dilatation.  He improved with diuresis.  The pleural effusions were felt to be most likely secondary to congestive heart failure.  He was also placed on corticosteroids due to the possibility of side effects of immunotherapy, but these were subsequently discontinued.  CT chest in March 2018 revealed stable cardiomegaly and stable small pericardial effusion, but no evidence of immune mediated pneumonitis or metastatic disease.  The previously seen bilateral pleural effusions had resolved.  He returned to our care and nivolumab every 2 weeks was resumed in April.  CT chest was repeated in early May after 1 month back on therapy.  This was stable, without evidence of recurrent pleural effusions, immunotherapy toxicities, or evidence of metastatic disease.  He was switched  to every 28-day nivolumab in August 2018.  He has had stable disease so has continued on nivolumab.  He remains on both furosemide and spironolactone.  CT imaging in October 2018 revealed a stable 5 mm level 1B cervical lymph node, as well as a stable 8 mm exophytic lesion of  the right face.  CT chest did not reveal any evidence of metastatic disease. Borderline cardiomegaly and a small amount of pericardial fluid/thickening was seen, but not reaccumulation of the pleural effusions.  CT head did not reveal any evidence of intracranial metastasis.  There were chronic ischemic changes and evidence of old infarcts.  He has continued palliative nivolumab.  CT neck and chest in February 2019 were stable.  He was admitted to [facility] in May 2019 twice with severe hypoglycemia.  Repeat imaging in May did not reveal any progressive disease.  His nivolumab was held in May, but resumed at the end of June.  He did not have a dose in 2023/05/23.  Unfortunately, his wife passed away in May 22, 2018 and she was his primary caregiver.  His daughter has been caring for him since.  CT neck and chest in November 2019 did not reveal any evidence of recurrent or metastatic disease.  CT imaging in January 2020 did not reveal evidence of recurrence, so he has continued nivolumab every 4 weeks.  He was given mirtazapine for depression and weight loss the dose was increased to 15 mg.  CT scans in June 2020 remained stable with a small pericardial effusion and bilateral renal cysts. CT neck, chest and abdomen in November 2020 did not reveal any evidence of metast.    CT head, neck and chest from May and October 2021 were negative for evidence of metastatic disease.  He was then lost to follow-up from December 2021 to April 1st, at which time he resumed nivolumab and B12 every 4 weeks.  INTERVAL HISTORY:  Kevin [name] is here today for repeat clinical assessment prior to another cycle of nivolumab  He states he continues to do well and denies complaints. He denies cough, dyspnea, diarrhea or increased skin rash.  He has persistent rash of the forehead which is unchanged. He denies fevers or chills. He reports low back pain for the past couple of days. He does not remember a specific injury.  He has a history of injury  to his back 40 years ago.  He continues to ambulate with a cane. His appetite is good. His weight has decreased 3 pounds over last 1 month.  REVIEW OF SYSTEMS:  Review of Systems  Constitutional: Negative for appetite change, chills, fatigue, fever and unexpected weight change.  HENT:   Negative for lump/mass, mouth sores and sore throat.   Respiratory: Negative for cough and shortness of breath.   Cardiovascular: Negative for chest pain and leg swelling.  Gastrointestinal: Negative for abdominal pain, constipation, diarrhea, nausea and vomiting.  Genitourinary: Negative for difficulty urinating, dysuria, frequency and hematuria.   Musculoskeletal: Positive for back pain (Mild for past few days). Negative for arthralgias and myalgias.  Skin: Positive for itching (Chronic, mild of forehead) and rash (Chronic, stable of forehead). Negative for wound.  Neurological: Negative for dizziness, extremity weakness, headaches, light-headedness and numbness.  Hematological: Negative for adenopathy. Bruises/bleeds easily.  Psychiatric/Behavioral: Negative for depression and sleep disturbance. The patient is not nervous/anxious.      VITALS:  Blood pressure 132/62, pulse 84, temperature 98.4 F (36.9 C), temperature source Oral, resp. rate 18, height 5\' 8"  (  1.727 m), weight 163 lb 4.8 oz (74.1 kg), SpO2 99 %.  Wt Readings from Last 3 Encounters:  03/12/21 163 lb 4.8 oz (74.1 kg)  02/11/21 165 lb 4 oz (75 kg)  02/08/21 166 lb 4.8 oz (75.4 kg)    Body mass index is 24.83 kg/m.  Performance status (ECOG): 1 - Symptomatic but completely ambulatory  PHYSICAL EXAM:  Physical Exam Vitals and nursing note reviewed.  Constitutional:      General: He is not in acute distress.    Appearance: Normal appearance. He is normal weight.  HENT:     Head: Normocephalic and atraumatic.     Mouth/Throat:     Mouth: Mucous membranes are moist.     Pharynx: Oropharynx is clear. No oropharyngeal exudate or  posterior oropharyngeal erythema.  Eyes:     General: No scleral icterus.    Extraocular Movements: Extraocular movements intact.     Conjunctiva/sclera: Conjunctivae normal.     Pupils: Pupils are equal, round, and reactive to light.  Cardiovascular:     Rate and Rhythm: Normal rate and regular rhythm.     Heart sounds: Normal heart sounds. No murmur heard. No friction rub. No gallop.   Pulmonary:     Effort: Pulmonary effort is normal.     Breath sounds: Normal breath sounds. No wheezing, rhonchi or rales.  Chest:  Breasts:     Right: No axillary adenopathy or supraclavicular adenopathy.     Left: No axillary adenopathy or supraclavicular adenopathy.    Abdominal:     General: Bowel sounds are normal. There is no distension.     Palpations: Abdomen is soft. There is no hepatomegaly, splenomegaly or mass.     Tenderness: There is no abdominal tenderness.  Musculoskeletal:        General: Normal range of motion.     Cervical back: Normal range of motion and neck supple. No tenderness.     Right lower leg: No edema.     Left lower leg: No edema.  Lymphadenopathy:     Cervical: No cervical adenopathy.     Upper Body:     Right upper body: No supraclavicular or axillary adenopathy.     Left upper body: No supraclavicular or axillary adenopathy.     Lower Body: No right inguinal adenopathy. No left inguinal adenopathy.  Skin:    General: Skin is warm and dry.     Coloration: Skin is not jaundiced.     Findings: Rash present.     Comments: There is persistent thickening of the right cheek without obvious recurrent disease. There is a chronic maculopapular erythematous rash of the forehead, which is stable .  There are scattered purpura of the bilateral hands and arms.  Neurological:     Mental Status: He is alert and oriented to person, place, and time.     Cranial Nerves: No cranial nerve deficit.  Psychiatric:        Mood and Affect: Mood normal.        Behavior: Behavior  normal.        Thought Content: Thought content normal.    LABS:   CBC Latest Ref Rng & Units 03/12/2021 02/08/2021 10/17/2020  WBC - 4.8 5.0 4.9  Hemoglobin 13.5 - 17.5 12.3(A) 11.6(A) 11.3(A)  Hematocrit 41 - 53 38(A) 35(A) 34(A)  Platelets 150 - 399 196 186 190   CMP Latest Ref Rng & Units 03/12/2021 02/08/2021 10/17/2020  Glucose 70 - 99 mg/dL - - -  BUN 4 - 21 17 21 20   Creatinine 0.6 - 1.3 0.9 1.0 1.2  Sodium 137 - 147 136(A) 138 139  Potassium 3.4 - 5.3 5.1 3.7 4.5  Chloride 99 - 108 103 104 103  CO2 13 - 22 25(A) 26(A) 28(A)  Calcium 8.7 - 10.7 9.3 9.2 9.2  Total Protein 6.5 - 8.1 g/dL - - -  Total Bilirubin 0.3 - 1.2 mg/dL - - -  Alkaline Phos 25 - 125 66 65 58  AST 14 - 40 17 18 18   ALT 10 - 40 12 12 11      No results found for: CEA1 / No results found for: CEA1 No results found for: PSA1 No results found for: CZY606 No results found for: TKZ601  No results found for: TOTALPROTELP, ALBUMINELP, A1GS, A2GS, BETS, BETA2SER, GAMS, MSPIKE, SPEI No results found for: TIBC, FERRITIN, IRONPCTSAT No results found for: LDH  STUDIES:  No results found.    HISTORY:   Past Medical History:  Diagnosis Date  . Cancer ([place])   . CHF (congestive heart failure) ([place])   . COPD (chronic obstructive pulmonary disease) ([place])   . Diabetes mellitus without complication ([place])   . History of myocardial infarction   . Hyperlipidemia   . Hypertension   . 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) ([place])   . 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) ([place])   . 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) [facility])     Past Surgical History:  Procedure Laterality Date  . APPENDECTOMY    . CARDIAC CATHETERIZATION    . CORONARY ANGIOPLASTY    . PACEMAKER INSERTION      Family History  Problem Relation Age of Onset  . Diabetes Mellitus II Father   . Breast cancer Mother   . Ovarian cancer Sister   . Cancer Brother   . Cancer Maternal Uncle   . Breast cancer Niece   .  Cancer Brother     Social History:  reports that he has been smoking cigarettes. He has a 60.00 pack-year smoking history. He has never used smokeless tobacco. He reports previous alcohol use. He reports previous drug use.The patient is alone today.  Allergies: No Known Allergies  Current Medications: Current Outpatient Medications  Medication Sig Dispense Refill  . atorvastatin (LIPITOR) 80 MG tablet Take 80 mg by mouth at bedtime.    . budesonide-formoterol (SYMBICORT) 80-4.5 MCG/ACT inhaler Inhale 2 puffs into the lungs 2 (two) times daily.    . clotrimazole-betamethasone (LOTRISONE) cream Apply 1 application topically 2 (two) times daily. 30 g 0  . Ferrous Fumarate (HEMOCYTE - 106 MG FE) 324 (106 Fe) MG TABS tablet Take 1 tablet by mouth.    . furosemide (LASIX) 40 MG tablet Take 40 mg by mouth daily.    Marland Kitchen glimepiride (AMARYL) 4 MG tablet Take by mouth.    Marland Kitchen lisinopril (ZESTRIL) 10 MG tablet Take 10 mg by mouth daily.    . magnesium oxide (MAG-OX) 400 (241.3 Mg) MG tablet Take 400 mg by mouth 2 (two) times daily.    . metFORMIN (GLUCOPHAGE) 1000 MG tablet Take 1,000 mg by mouth 2 (two) times daily.    . metFORMIN (GLUCOPHAGE) 500 MG tablet Take 1,000 mg by mouth 2 (two) times daily with a meal.    . mirtazapine (REMERON) 15 MG tablet Take 15 mg by mouth at bedtime.    Marland Kitchen nystatin (MYCOSTATIN/NYSTOP) powder Apply 1 application topically 3 (three) times daily. 15 g  0  . ondansetron (ZOFRAN) 4 MG tablet Take 1 tablet (4 mg total) by mouth every 4 (four) hours as needed for nausea. 90 tablet 3  . ondansetron (ZOFRAN-ODT) 4 MG disintegrating tablet Take by mouth at bedtime.    . pioglitazone (ACTOS) 30 MG tablet Take 30 mg by mouth daily.    . polyethylene glycol (MIRALAX / GLYCOLAX) 17 g packet Take 17 g by mouth daily.    . prochlorperazine (COMPAZINE) 10 MG tablet Take 10 mg by mouth every 6 (six) hours as needed for nausea or vomiting.    . sitaGLIPtin (JANUVIA) 100 MG tablet Take 100 mg  by mouth daily.    Marland Kitchen spironolactone (ALDACTONE) 25 MG tablet Take 25 mg by mouth daily.    Marland Kitchen triamcinolone (KENALOG) 0.025 % cream Apply 1 application topically 2 (two) times daily.     No current facility-administered medications for this visit.     ASSESSMENT & PLAN:   Assessment:  1. Recurrent/metastatic melanoma, which remains controlled with palliative nivolumab.   2. B12 deficiency, for which he resumed monthly B12 injections in April with improvement of his hemoglobin.  3. History of chronic kidney disease, BUN and creatinine normalized.  4. Congestive heart failure and pacemaker.  5. 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   6. Mild hypomagnesemia, resolved with oral supplement twice daily, which he will continue.  7. Tobacco abuse, we discussed the importance of complete abstinence from tobacco.  The patient is not motivated to quit at this time.    Plan:   He will proceed with nivolumab and B12 this week.  He knows to continue all his medications as prescribed.  I will plan to see him in 4 weeks with a CBC, comprehensive metabolic panel, TSH and T4 prior to his next nivolumab and B12.  Dr. Hinton Rao has recommended routine imaging in the fall. The patient understands the plans discussed today and is in agreement with them.  He knows to contact our office if he develops concerns prior to his next appointment.     [name], PA-C

## 2021-03-13 ENCOUNTER — Inpatient Hospital Stay: Payer: Medicare PPO

## 2021-03-13 ENCOUNTER — Other Ambulatory Visit: Payer: Self-pay

## 2021-03-13 VITALS — BP 114/57 | HR 81 | Temp 97.8°F | Resp 18 | Wt 160.0 lb

## 2021-03-13 DIAGNOSIS — J449 Chronic obstructive pulmonary disease, unspecified: Secondary | ICD-10-CM | POA: Diagnosis not present

## 2021-03-13 DIAGNOSIS — I110 Hypertensive heart disease with heart failure: Secondary | ICD-10-CM | POA: Diagnosis not present

## 2021-03-13 DIAGNOSIS — C4339 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: Secondary | ICD-10-CM

## 2021-03-13 DIAGNOSIS — I2510 Atherosclerotic heart disease of native coronary artery without angina pectoris: Secondary | ICD-10-CM | POA: Diagnosis not present

## 2021-03-13 DIAGNOSIS — E876 Hypokalemia: Secondary | ICD-10-CM | POA: Diagnosis not present

## 2021-03-13 DIAGNOSIS — E538 Deficiency of other specified B group vitamins: Secondary | ICD-10-CM | POA: Diagnosis not present

## 2021-03-13 DIAGNOSIS — I5040 Unspecified combined systolic (congestive) and diastolic (congestive) heart failure: Secondary | ICD-10-CM | POA: Diagnosis not present

## 2021-03-13 DIAGNOSIS — Z5112 Encounter for antineoplastic immunotherapy: Secondary | ICD-10-CM | POA: Diagnosis not present

## 2021-03-13 MED ORDER — SODIUM CHLORIDE 0.9 % IV SOLN
Freq: Once | INTRAVENOUS | Status: AC
  Filled 2021-03-13: qty 250

## 2021-03-13 MED ORDER — CYANOCOBALAMIN 1000 MCG/ML IJ SOLN
1000.0000 ug | Freq: Once | INTRAMUSCULAR | Status: AC
  Administered 2021-03-13: 1000 ug via INTRAMUSCULAR

## 2021-03-13 MED ORDER — CYANOCOBALAMIN 1000 MCG/ML IJ SOLN
INTRAMUSCULAR | Status: AC
  Filled 2021-03-13: qty 1

## 2021-03-13 MED ORDER — SODIUM CHLORIDE 0.9 % IV SOLN
480.0000 mg | Freq: Once | INTRAVENOUS | Status: AC
  Administered 2021-03-13: 480 mg via INTRAVENOUS
  Filled 2021-03-13: qty 48

## 2021-03-13 MED ORDER — HEPARIN SOD (PORK) LOCK FLUSH 100 UNIT/ML IV SOLN
500.0000 [IU] | Freq: Once | INTRAVENOUS | Status: AC | PRN
  Administered 2021-03-13: 500 [IU]
  Filled 2021-03-13: qty 5

## 2021-03-13 MED ORDER — SODIUM CHLORIDE 0.9% FLUSH
10.0000 mL | INTRAVENOUS | Status: DC | PRN
  Administered 2021-03-13: 10 mL
  Filled 2021-03-13: qty 10

## 2021-03-13 NOTE — Patient Instructions (Signed)
Cyanocobalamin, Vitamin B12 injection What is this medicine? CYANOCOBALAMIN (sye an oh koe BAL a min) is a man made form of vitamin B12. Vitamin B12 is used in the growth of healthy blood cells, nerve cells, and proteins in the body. It also helps with the metabolism of fats and carbohydrates. This medicine is used to treat people who can not absorb vitamin B12. This medicine may be used for other purposes; ask your health care provider or pharmacist if you have questions. COMMON BRAND NAME(S): B-12 Compliance Kit, B-12 Injection Kit, Cyomin, LA-12, Nutri-Twelve, Physicians EZ Use B-12, Primabalt What should I tell my health care provider before I take this medicine? They need to know if you have any of these conditions:  kidney disease  Leber's disease  megaloblastic anemia  an unusual or allergic reaction to cyanocobalamin, cobalt, other medicines, foods, dyes, or preservatives  pregnant or trying to get pregnant  breast-feeding How should I use this medicine? This medicine is injected into a muscle or deeply under the skin. It is usually given by a health care professional in a clinic or doctor's office. However, your doctor may teach you how to inject yourself. Follow all instructions. Talk to your pediatrician regarding the use of this medicine in children. Special care may be needed. Overdosage: If you think you have taken too much of this medicine contact a poison control center or emergency room at once. NOTE: This medicine is only for you. Do not share this medicine with others. What if I miss a dose? If you are given your dose at a clinic or doctor's office, call to reschedule your appointment. If you give your own injections and you miss a dose, take it as soon as you can. If it is almost time for your next dose, take only that dose. Do not take double or extra doses. What may interact with this medicine?  colchicine  heavy alcohol intake This list may not describe all  possible interactions. Give your health care provider a list of all the medicines, herbs, non-prescription drugs, or dietary supplements you use. Also tell them if you smoke, drink alcohol, or use illegal drugs. Some items may interact with your medicine. What should I watch for while using this medicine? Visit your doctor or health care professional regularly. You may need blood work done while you are taking this medicine. You may need to follow a special diet. Talk to your doctor. Limit your alcohol intake and avoid smoking to get the best benefit. What side effects may I notice from receiving this medicine? Side effects that you should report to your doctor or health care professional as soon as possible:  allergic reactions like skin rash, itching or hives, swelling of the face, lips, or tongue  blue tint to skin  chest tightness, pain  difficulty breathing, wheezing  dizziness  red, swollen painful area on the leg Side effects that usually do not require medical attention (report to your doctor or health care professional if they continue or are bothersome):  diarrhea  headache This list may not describe all possible side effects. Call your doctor for medical advice about side effects. You may report side effects to FDA at 1-800-FDA-1088. Where should I keep my medicine? Keep out of the reach of children. Store at room temperature between 15 and 30 degrees C (59 and 85 degrees F). Protect from light. Throw away any unused medicine after the expiration date. NOTE: This sheet is a summary. It may not cover  all possible information. If you have questions about this medicine, talk to your doctor, pharmacist, or health care provider.  2021 Elsevier/Gold Standard (2008-02-07 22:10:20) Nivolumab injection What is this medicine? NIVOLUMAB (nye VOL ue mab) is a monoclonal antibody. It treats certain types of cancer. Some of the cancers treated are colon cancer, head and neck cancer,  Hodgkin lymphoma, lung cancer, and melanoma. This medicine may be used for other purposes; ask your health care provider or pharmacist if you have questions. COMMON BRAND NAME(S): Opdivo What should I tell my health care provider before I take this medicine? They need to know if you have any of these conditions:  autoimmune diseases like Crohn's disease, ulcerative colitis, or lupus  have had or planning to have an allogeneic stem cell transplant (uses someone else's stem cells)  history of chest radiation  history of organ transplant  nervous system problems like myasthenia gravis or Guillain-Barre syndrome  an unusual or allergic reaction to nivolumab, other medicines, foods, dyes, or preservatives  pregnant or trying to get pregnant  breast-feeding How should I use this medicine? This medicine is for infusion into a vein. It is given by a health care professional in a hospital or clinic setting. A special MedGuide will be given to you before each treatment. Be sure to read this information carefully each time. Talk to your pediatrician regarding the use of this medicine in children. While this drug may be prescribed for children as young as 12 years for selected conditions, precautions do apply. Overdosage: If you think you have taken too much of this medicine contact a poison control center or emergency room at once. NOTE: This medicine is only for you. Do not share this medicine with others. What if I miss a dose? It is important not to miss your dose. Call your doctor or health care professional if you are unable to keep an appointment. What may interact with this medicine? Interactions have not been studied. This list may not describe all possible interactions. Give your health care provider a list of all the medicines, herbs, non-prescription drugs, or dietary supplements you use. Also tell them if you smoke, drink alcohol, or use illegal drugs. Some items may interact with  your medicine. What should I watch for while using this medicine? This drug may make you feel generally unwell. Continue your course of treatment even though you feel ill unless your doctor tells you to stop. You may need blood work done while you are taking this medicine. Do not become pregnant while taking this medicine or for 5 months after stopping it. Women should inform their doctor if they wish to become pregnant or think they might be pregnant. There is a potential for serious side effects to an unborn child. Talk to your health care professional or pharmacist for more information. Do not breast-feed an infant while taking this medicine or for 5 months after stopping it. What side effects may I notice from receiving this medicine? Side effects that you should report to your doctor or health care professional as soon as possible:  allergic reactions like skin rash, itching or hives, swelling of the face, lips, or tongue  breathing problems  blood in the urine  bloody or watery diarrhea or black, tarry stools  changes in emotions or moods  changes in vision  chest pain  cough  dizziness  feeling faint or lightheaded, falls  fever, chills  headache with fever, neck stiffness, confusion, loss of memory, sensitivity to light,  hallucination, loss of contact with reality, or seizures  joint pain  mouth sores  redness, blistering, peeling or loosening of the skin, including inside the mouth  severe muscle pain or weakness  signs and symptoms of high blood sugar such as dizziness; dry mouth; dry skin; fruity breath; nausea; stomach pain; increased hunger or thirst; increased urination  signs and symptoms of kidney injury like trouble passing urine or change in the amount of urine  signs and symptoms of liver injury like dark yellow or brown urine; general ill feeling or flu-like symptoms; light-colored stools; loss of appetite; nausea; right upper belly pain; unusually weak  or tired; yellowing of the eyes or skin  swelling of the ankles, feet, hands  trouble passing urine or change in the amount of urine  unusually weak or tired  weight gain or loss Side effects that usually do not require medical attention (report to your doctor or health care professional if they continue or are bothersome):  bone pain  constipation  decreased appetite  diarrhea  muscle pain  nausea, vomiting  tiredness This list may not describe all possible side effects. Call your doctor for medical advice about side effects. You may report side effects to FDA at 1-800-FDA-1088. Where should I keep my medicine? This drug is given in a hospital or clinic and will not be stored at home. NOTE: This sheet is a summary. It may not cover all possible information. If you have questions about this medicine, talk to your doctor, pharmacist, or health care provider.  2021 Elsevier/Gold Standard (2020-02-29 10:08:25)

## 2021-03-14 LAB — T4: T4, Total: 7.2 ug/dL (ref 4.5–12.0)

## 2021-04-09 ENCOUNTER — Telehealth: Payer: Self-pay | Admitting: Hematology and Oncology

## 2021-04-09 ENCOUNTER — Inpatient Hospital Stay: Payer: Medicare PPO

## 2021-04-09 ENCOUNTER — Encounter: Payer: Self-pay | Admitting: Hematology and Oncology

## 2021-04-09 ENCOUNTER — Other Ambulatory Visit: Payer: Self-pay | Admitting: Pharmacist

## 2021-04-09 ENCOUNTER — Inpatient Hospital Stay (INDEPENDENT_AMBULATORY_CARE_PROVIDER_SITE_OTHER): Payer: Medicare PPO | Admitting: Hematology and Oncology

## 2021-04-09 ENCOUNTER — Other Ambulatory Visit: Payer: Self-pay

## 2021-04-09 VITALS — BP 111/72 | HR 86 | Temp 98.5°F | Resp 18 | Ht 68.0 in | Wt 156.4 lb

## 2021-04-09 DIAGNOSIS — D649 Anemia, unspecified: Secondary | ICD-10-CM | POA: Diagnosis not present

## 2021-04-09 DIAGNOSIS — J449 Chronic obstructive pulmonary disease, unspecified: Secondary | ICD-10-CM | POA: Diagnosis not present

## 2021-04-09 DIAGNOSIS — C4339 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: Secondary | ICD-10-CM | POA: Diagnosis not present

## 2021-04-09 DIAGNOSIS — I5040 Unspecified combined systolic (congestive) and diastolic (congestive) heart failure: Secondary | ICD-10-CM | POA: Diagnosis not present

## 2021-04-09 DIAGNOSIS — E876 Hypokalemia: Secondary | ICD-10-CM | POA: Diagnosis not present

## 2021-04-09 DIAGNOSIS — E538 Deficiency of other specified B group vitamins: Secondary | ICD-10-CM | POA: Diagnosis not present

## 2021-04-09 DIAGNOSIS — Z5112 Encounter for antineoplastic immunotherapy: Secondary | ICD-10-CM | POA: Diagnosis not present

## 2021-04-09 DIAGNOSIS — I2510 Atherosclerotic heart disease of native coronary artery without angina pectoris: Secondary | ICD-10-CM | POA: Diagnosis not present

## 2021-04-09 DIAGNOSIS — I110 Hypertensive heart disease with heart failure: Secondary | ICD-10-CM | POA: Diagnosis not present

## 2021-04-09 LAB — CBC: RBC: 3.9 (ref 3.87–5.11)

## 2021-04-09 LAB — BASIC METABOLIC PANEL
BUN: 29 — AB (ref 4–21)
CO2: 23 — AB (ref 13–22)
Chloride: 106 (ref 99–108)
Creatinine: 1.1 (ref 0.6–1.3)
Glucose: 125
Potassium: 5.4 — AB (ref 3.4–5.3)
Sodium: 138 (ref 137–147)

## 2021-04-09 LAB — HEPATIC FUNCTION PANEL
ALT: 12 (ref 10–40)
AST: 18 (ref 14–40)
Alkaline Phosphatase: 59 (ref 25–125)
Bilirubin, Total: 0.4

## 2021-04-09 LAB — CBC AND DIFFERENTIAL
HCT: 37 — AB (ref 41–53)
Hemoglobin: 12.2 — AB (ref 13.5–17.5)
Neutrophils Absolute: 2.77
Platelets: 204 (ref 150–399)
WBC: 3.9

## 2021-04-09 LAB — COMPREHENSIVE METABOLIC PANEL
Albumin: 4.4 (ref 3.5–5.0)
Calcium: 9.3 (ref 8.7–10.7)

## 2021-04-09 LAB — TSH: TSH: 1.902 u[IU]/mL (ref 0.350–4.500)

## 2021-04-09 NOTE — Telephone Encounter (Signed)
Per 5/31 los next appt scheduled and given to patient 

## 2021-04-09 NOTE — Progress Notes (Signed)
Kevin [name]  [address] [place],  [place]  [address] [phone]  Clinic Day:  04/09/2021  Referring physician: [name], PA-C   CHIEF COMPLAINT:  CC:   Recurrent melanoma of the right face and B12 deficiency  Current Treatment:   Maintenance nivolumab every 4 weeks, as well as B12 every 4 weeks   HISTORY OF PRESENT ILLNESS:  Kevin [name] is a 80 y.o. 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  His original lesion was diagnosed in March 2016.  He underwent excision, but refused aggressive surgery.  The lesion was at least 4 mm thick with a Clark's level 4, and a mitotic index every 10/mm.  The margins were positive, but reexcision revealed no residual melanoma.  He had recurrence within 2 months and the lesion had been steadily growing.  He initially had refused seeing a medical oncologist, but finally came to [place] for evaluation and treatment in October 2016.  He does have extensive comorbidities including COPD, diabetes, hypertension, coronary artery disease, history of myocardial infarction, and pacemaker with AICD.  PET revealed hypermetabolism in the right submandibular node, but no evidence of distant metastasis.  He was having significant pain and requiring regular hydrocodone doses.  He also had severe disfigurement and avoided going out in public because of the facial lession.  He has been receiving palliative nivolumab since November 2016 and has had a good response with a decrease in the right facial tumor.  He has had associated hypomagnesemia and hypokalemia, so is on oral magnesium and potassium supplements. He has also intermittently required IV magnesium and potassium supplementation.  He has had mild chronic anemia, which has been stable. He was found to have B12 deficiency and continues B12 injections monthly.  CT head/neck in May 2017 revealed some nonspecific skin thickening in the right face in the area of his  melanoma in the right submandibular lymph node had decreased in size, now measuring 6 x 12 mm, but still prominent when compared to the left submandibular node.  He had 15 cycles of nivolumab and relocated to continue to get the same treatment.  He had his 30th dose in February 2018 and then returned to the [place] area.    However, he was admitted to the hospital in early March 2018 for congestive heart failure associated with pleural and pericardial effusions.  He also had changes in the lung and it was unclear as to how much of this was fluid overload versus infection versus pneumonitis from the immunotherapy.  He had no leukocytosis, fever or purulent sputum.  The echocardiogram revealed severe diffuse hypokinesis with mild concentric hypertrophy and markedly depressed ejection fraction of 20 to 25%, associated with moderate mitral regurgitation and moderate left atrial dilatation.  He improved with diuresis.  The pleural effusions were felt to be most likely secondary to congestive heart failure.  He was also placed on corticosteroids due to the possibility of side effects of immunotherapy, but these were subsequently discontinued.  CT chest in March 2018 revealed stable cardiomegaly and stable small pericardial effusion, but no evidence of immune mediated pneumonitis or metastatic disease.  The previously seen bilateral pleural effusions had resolved.  He returned to our care and nivolumab every 2 weeks was resumed in April.  CT chest was repeated in early May after 1 month back on therapy.  This was stable, without evidence of recurrent pleural effusions, immunotherapy toxicities, or evidence of metastatic disease.  He was switched  to every 28-day nivolumab in August 2018.  He has had stable disease so has continued on nivolumab.  He remains on both furosemide and spironolactone.  CT imaging in October 2018 revealed a stable 5 mm level 1B cervical lymph node, as well as a stable 8 mm exophytic lesion of  the right face.  CT chest did not reveal any evidence of metastatic disease. Borderline cardiomegaly and a small amount of pericardial fluid/thickening was seen, but not reaccumulation of the pleural effusions.  CT head did not reveal any evidence of intracranial metastasis.  There were chronic ischemic changes and evidence of old infarcts.  He has continued palliative nivolumab.  CT neck and chest in February 2019 were stable.  He was admitted to [facility] in May 2019 twice with severe hypoglycemia.  Repeat imaging in May did not reveal any progressive disease.  His nivolumab was held in May, but resumed at the end of June.  He did not have a dose in 06-28-2023.  Unfortunately, his wife passed away in 06-27-2018 and she was his primary caregiver.  His daughter has been caring for him since.  CT neck and chest in November 2019 did not reveal any evidence of recurrent or metastatic disease.  CT imaging in January 2020 did not reveal evidence of recurrence, so he has continued nivolumab every 4 weeks.  He was given mirtazapine for depression and weight loss the dose was increased to 15 mg.  CT scans in June 2020 remained stable with a small pericardial effusion and bilateral renal cysts. CT neck, chest and abdomen in November 2020 did not reveal any evidence of metast.    CT head, neck and chest from May and October 2021 were negative for evidence of metastatic disease.  He was then lost to follow-up from December 2021 to April 2022, at which time he resumed nivolumab and B12 every 4 weeks.   INTERVAL HISTORY:  Kevin [name] is here today for repeat clinical assessment prior to another cycle of nivolumab  He states he continues to do well and denies complaints. He denies cough, dyspnea, diarrhea or increased skin rash.  He has persistent mild rash of the forehead, which is unchanged. He denies pain today.  His gait is unsteady, but he states he does not need his cane as much as previous. He denies fevers or chills. His  appetite is fairly good, but he does not eat as much as previous. His weight has decreased 4 pounds over last 1 month.  REVIEW OF SYSTEMS:  Review of Systems  Constitutional: Negative for appetite change, chills, fatigue, fever and unexpected weight change.  HENT:   Negative for lump/mass, mouth sores and sore throat.   Respiratory: Negative for cough and shortness of breath.   Cardiovascular: Negative for chest pain and leg swelling.  Gastrointestinal: Negative for abdominal pain, constipation, diarrhea, nausea and vomiting.  Genitourinary: Negative for difficulty urinating, dysuria, frequency and hematuria.   Musculoskeletal: Negative for arthralgias, back pain and myalgias.  Skin: Negative for itching, rash and wound.  Neurological: Negative for dizziness, extremity weakness, headaches, light-headedness and numbness.  Hematological: Negative for adenopathy.  Psychiatric/Behavioral: Negative for depression and sleep disturbance. The patient is not nervous/anxious.    VITALS:  Blood pressure 111/72, pulse 86, temperature 98.5 F (36.9 C), temperature source Oral, resp. rate 18, height 5\' 8"  (1.727 m), weight 156 lb 6.4 oz (70.9 kg), SpO2 96 %.  Wt Readings from Last 3 Encounters:  04/09/21 156 lb 6.4 oz (70.9  kg)  03/13/21 160 lb 0.6 oz (72.6 kg)  03/12/21 163 lb 4.8 oz (74.1 kg)    Body mass index is 23.78 kg/m.  Performance status (ECOG): 1 - Symptomatic but completely ambulatory  PHYSICAL EXAM:  Physical Exam Vitals and nursing note reviewed.  Constitutional:      General: He is not in acute distress.    Appearance: Normal appearance. He is normal weight.  HENT:     Head: Normocephalic and atraumatic.     Mouth/Throat:     Mouth: Mucous membranes are moist.     Pharynx: Oropharynx is clear. No oropharyngeal exudate or posterior oropharyngeal erythema.  Eyes:     General: No scleral icterus.    Extraocular Movements: Extraocular movements intact.     Conjunctiva/sclera:  Conjunctivae normal.     Pupils: Pupils are equal, round, and reactive to light.  Cardiovascular:     Rate and Rhythm: Normal rate and regular rhythm.     Heart sounds: Normal heart sounds. No murmur heard. No friction rub. No gallop.   Pulmonary:     Effort: Pulmonary effort is normal.     Breath sounds: Normal breath sounds. No wheezing, rhonchi or rales.  Chest:  Breasts:     Right: No axillary adenopathy or supraclavicular adenopathy.     Left: No axillary adenopathy or supraclavicular adenopathy.    Abdominal:     General: Bowel sounds are normal. There is no distension.     Palpations: Abdomen is soft. There is no hepatomegaly, splenomegaly or mass.     Tenderness: There is no abdominal tenderness.  Musculoskeletal:        General: Normal range of motion.     Cervical back: Normal range of motion and neck supple. No tenderness.     Right lower leg: No edema.     Left lower leg: No edema.  Lymphadenopathy:     Cervical: No cervical adenopathy.     Upper Body:     Right upper body: No supraclavicular or axillary adenopathy.     Left upper body: No supraclavicular or axillary adenopathy.     Lower Body: No right inguinal adenopathy. No left inguinal adenopathy.  Skin:    General: Skin is warm and dry.     Coloration: Skin is not jaundiced.     Findings: Rash present.     Comments: There is persistent thickening of the right cheek without obvious recurrent disease. There is a chronic maculopapular erythematous rash of the forehead, which is stable .  There are scattered purpura of the bilateral hands and arms.  Neurological:     Mental Status: He is alert and oriented to person, place, and time.     Cranial Nerves: No cranial nerve deficit.  Psychiatric:        Mood and Affect: Mood normal.        Behavior: Behavior normal.        Thought Content: Thought content normal.    LABS:   CBC Latest Ref Rng & Units 04/09/2021 03/12/2021 02/08/2021  WBC - 3.9 4.8 5.0  Hemoglobin  13.5 - 17.5 12.2(A) 12.3(A) 11.6(A)  Hematocrit 41 - 53 37(A) 38(A) 35(A)  Platelets 150 - 399 204 196 186   CMP Latest Ref Rng & Units 04/09/2021 03/12/2021 02/08/2021  Glucose 70 - 99 mg/dL - - -  BUN 4 - 21 29(A) 17 21  Creatinine 0.6 - 1.3 1.1 0.9 1.0  Sodium 137 - 147 138 136(A) 138  Potassium  3.4 - 5.3 5.4(A) 5.1 3.7  Chloride 99 - 108 106 103 104  CO2 13 - 22 23(A) 25(A) 26(A)  Calcium 8.7 - 10.7 9.3 9.3 9.2  Total Protein 6.5 - 8.1 g/dL - - -  Total Bilirubin 0.3 - 1.2 mg/dL - - -  Alkaline Phos 25 - 125 59 66 65  AST 14 - 40 18 17 18   ALT 10 - 40 12 12 12      No results found for: CEA1 / No results found for: CEA1 No results found for: PSA1 No results found for: KWI097 No results found for: DZH299  No results found for: TOTALPROTELP, ALBUMINELP, A1GS, A2GS, BETS, BETA2SER, GAMS, MSPIKE, SPEI No results found for: TIBC, FERRITIN, IRONPCTSAT No results found for: LDH  STUDIES:  No results found.    HISTORY:   Past Medical History:  Diagnosis Date  . Cancer ([place])   . CHF (congestive heart failure) ([place])   . COPD (chronic obstructive pulmonary disease) ([place])   . Diabetes mellitus without complication ([place])   . History of myocardial infarction   . Hyperlipidemia   . Hypertension   . 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) ([place])   . 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) ([place])   . 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) [facility])     Past Surgical History:  Procedure Laterality Date  . APPENDECTOMY    . CARDIAC CATHETERIZATION    . CORONARY ANGIOPLASTY    . PACEMAKER INSERTION      Family History  Problem Relation Age of Onset  . Diabetes Mellitus II Father   . Breast cancer Mother   . Ovarian cancer Sister   . Cancer Brother   . Cancer Maternal Uncle   . Breast cancer Niece   . Cancer Brother     Social History:  reports that he has been smoking cigarettes. He has a 60.00 pack-year smoking history. He has never used smokeless  tobacco. He reports previous alcohol use. He reports previous drug use.The patient is alone today.  Allergies: No Known Allergies  Current Medications: Current Outpatient Medications  Medication Sig Dispense Refill  . atorvastatin (LIPITOR) 80 MG tablet Take 80 mg by mouth at bedtime.    . budesonide-formoterol (SYMBICORT) 80-4.5 MCG/ACT inhaler Inhale 2 puffs into the lungs 2 (two) times daily.    . clotrimazole-betamethasone (LOTRISONE) cream Apply 1 application topically 2 (two) times daily. 30 g 0  . Ferrous Fumarate (HEMOCYTE - 106 MG FE) 324 (106 Fe) MG TABS tablet Take 1 tablet by mouth.    . furosemide (LASIX) 40 MG tablet Take 40 mg by mouth daily.    Marland Kitchen glimepiride (AMARYL) 4 MG tablet Take by mouth.    Marland Kitchen lisinopril (ZESTRIL) 10 MG tablet Take 10 mg by mouth daily.    . magnesium oxide (MAG-OX) 400 (241.3 Mg) MG tablet Take 400 mg by mouth 2 (two) times daily.    . metFORMIN (GLUCOPHAGE) 1000 MG tablet Take 1,000 mg by mouth 2 (two) times daily.    . metFORMIN (GLUCOPHAGE) 500 MG tablet Take 1,000 mg by mouth 2 (two) times daily with a meal.    . mirtazapine (REMERON) 15 MG tablet Take 15 mg by mouth at bedtime.    Marland Kitchen nystatin (MYCOSTATIN/NYSTOP) powder Apply 1 application topically 3 (three) times daily. 15 g 0  . ondansetron (ZOFRAN) 4 MG tablet Take 1 tablet (4 mg total) by mouth every 4 (four) hours as needed for nausea. [place]  tablet 3  . ondansetron (ZOFRAN-ODT) 4 MG disintegrating tablet Take by mouth at bedtime.    . pioglitazone (ACTOS) 30 MG tablet Take 30 mg by mouth daily.    . polyethylene glycol (MIRALAX / GLYCOLAX) 17 g packet Take 17 g by mouth daily.    . prochlorperazine (COMPAZINE) 10 MG tablet Take 10 mg by mouth every 6 (six) hours as needed for nausea or vomiting.    . sitaGLIPtin (JANUVIA) 100 MG tablet Take 100 mg by mouth daily.    Marland Kitchen spironolactone (ALDACTONE) 25 MG tablet Take 25 mg by mouth daily.    Marland Kitchen triamcinolone (KENALOG) 0.025 % cream Apply 1 application  topically 2 (two) times daily.     No current facility-administered medications for this visit.     ASSESSMENT & PLAN:   Assessment:  1. Recurrent/metastatic melanoma, which remains controlled with palliative nivolumab.   2. B12 deficiency, for which he resumed monthly B12 injections in April with improvement of his hemoglobin.  3. History of chronic kidney disease, BUN and creatinine normalized.  4. Congestive heart failure and pacemaker.  5. 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   6. Mild hypomagnesemia, resolved with oral supplement twice daily, which he will continue.  7. Tobacco abuse, we discussed the importance of complete abstinence from tobacco. The patient is not motivated to quit at this time.    8. Mild hyperkalemia, most likely secondary to dehydration. I will give him additional fluids with his nivolumab tomorrow. I recommended he decrease his soda intake and increase his water intake.  Plan:   He will proceed with nivolumab and B12 this week.  He knows to continue iron and magnesium as prescribed. He knows to decrease his soda intake and increase his water intake.  I will plan to see him in 4 weeks with a CBC, comprehensive metabolic panel, TSH and T4 prior to his next nivolumab and B12. Dr. Hinton Rao has recommended routine imaging in the fall. The patient understands the plans discussed today and is in agreement with them.  He knows to contact our office if he develops concerns prior to his next appointment.     [name], PA-C

## 2021-04-09 NOTE — Telephone Encounter (Signed)
Patient's caregiver called to verify today's Appts

## 2021-04-10 ENCOUNTER — Other Ambulatory Visit: Payer: Self-pay

## 2021-04-10 ENCOUNTER — Encounter: Payer: Self-pay | Admitting: Oncology

## 2021-04-10 ENCOUNTER — Inpatient Hospital Stay: Payer: Medicare PPO | Attending: Oncology

## 2021-04-10 VITALS — BP 130/74 | HR 84 | Temp 98.2°F | Resp 18 | Ht 68.0 in | Wt 158.1 lb

## 2021-04-10 DIAGNOSIS — E875 Hyperkalemia: Secondary | ICD-10-CM | POA: Diagnosis not present

## 2021-04-10 DIAGNOSIS — I130 Hypertensive heart and chronic kidney disease with heart failure and stage 1 through stage 4 chronic kidney disease, or unspecified chronic kidney disease: Secondary | ICD-10-CM | POA: Diagnosis not present

## 2021-04-10 DIAGNOSIS — Z5112 Encounter for antineoplastic immunotherapy: Secondary | ICD-10-CM | POA: Insufficient documentation

## 2021-04-10 DIAGNOSIS — E1122 Type 2 diabetes mellitus with diabetic chronic kidney disease: Secondary | ICD-10-CM | POA: Insufficient documentation

## 2021-04-10 DIAGNOSIS — C4330 Malignant melanoma of unspecified part of face: Secondary | ICD-10-CM | POA: Insufficient documentation

## 2021-04-10 DIAGNOSIS — C4339 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: Secondary | ICD-10-CM

## 2021-04-10 DIAGNOSIS — Z79899 Other long term (current) drug therapy: Secondary | ICD-10-CM | POA: Insufficient documentation

## 2021-04-10 DIAGNOSIS — N189 Chronic kidney disease, unspecified: Secondary | ICD-10-CM | POA: Diagnosis not present

## 2021-04-10 DIAGNOSIS — E538 Deficiency of other specified B group vitamins: Secondary | ICD-10-CM | POA: Diagnosis not present

## 2021-04-10 LAB — T4: T4, Total: 7.7 ug/dL (ref 4.5–12.0)

## 2021-04-10 MED ORDER — CYANOCOBALAMIN 1000 MCG/ML IJ SOLN
1000.0000 ug | Freq: Once | INTRAMUSCULAR | Status: AC
Start: 2021-04-10 — End: 2021-04-10
  Administered 2021-04-10: 1000 ug via INTRAMUSCULAR

## 2021-04-10 MED ORDER — SODIUM CHLORIDE 0.9 % IV SOLN
480.0000 mg | Freq: Once | INTRAVENOUS | Status: AC
  Administered 2021-04-10: 480 mg via INTRAVENOUS
  Filled 2021-04-10: qty 48

## 2021-04-10 MED ORDER — HEPARIN SOD (PORK) LOCK FLUSH 100 UNIT/ML IV SOLN
500.0000 [IU] | Freq: Once | INTRAVENOUS | Status: AC | PRN
  Administered 2021-04-10: 500 [IU]
  Filled 2021-04-10: qty 5

## 2021-04-10 MED ORDER — CYANOCOBALAMIN 1000 MCG/ML IJ SOLN
INTRAMUSCULAR | Status: AC
  Filled 2021-04-10: qty 1

## 2021-04-10 MED ORDER — SODIUM CHLORIDE 0.9 % IV SOLN
Freq: Once | INTRAVENOUS | Status: AC
  Filled 2021-04-10: qty 250

## 2021-04-10 NOTE — Patient Instructions (Signed)
Kevin [name]  Discharge Instructions: Thank you for choosing [facility] to provide your oncology and hematology care.  If you have a lab appointment with the [facility], please go directly to the [facility] and check in at the registration area.   Wear comfortable clothing and clothing appropriate for easy access to any Portacath or PICC line.   We strive to give you quality time with your provider. You may need to reschedule your appointment if you arrive late (15 or more minutes).  Arriving late affects you and other patients whose appointments are after yours.  Also, if you miss three or more appointments without notifying the office, you may be dismissed from the clinic at the provider's discretion.      For prescription refill requests, have your pharmacy contact our office and allow 72 hours for refills to be completed.    Today you received the following chemotherapy and/or immunotherapy agents Nivolumab     To help prevent nausea and vomiting after your treatment, we encourage you to take your nausea medication as directed.  BELOW ARE SYMPTOMS THAT SHOULD BE REPORTED IMMEDIATELY: . *FEVER GREATER THAN 100.4 F (38 C) OR HIGHER . *CHILLS OR SWEATING . *NAUSEA AND VOMITING THAT IS NOT CONTROLLED WITH YOUR NAUSEA MEDICATION . *UNUSUAL SHORTNESS OF BREATH . *UNUSUAL BRUISING OR BLEEDING . *URINARY PROBLEMS (pain or burning when urinating, or frequent urination) . *BOWEL PROBLEMS (unusual diarrhea, constipation, pain near the anus) . TENDERNESS IN MOUTH AND THROAT WITH OR WITHOUT PRESENCE OF ULCERS (sore throat, sores in mouth, or a toothache) . UNUSUAL RASH, SWELLING OR PAIN  . UNUSUAL VAGINAL DISCHARGE OR ITCHING   Items with * indicate a potential emergency and should be followed up as soon as possible or go to the Emergency Department if any problems should occur.  Please show the CHEMOTHERAPY ALERT CARD or IMMUNOTHERAPY ALERT CARD at  check-in to the Emergency Department and triage nurse.  Should you have questions after your visit or need to cancel or reschedule your appointment, please contact [facility]  Dept: [phone]  and follow the prompts.  Office hours are 8:00 a.m. to 4:30 p.m. Monday - Friday. Please note that voicemails left after 4:00 p.m. may not be returned until the following business day.  We are closed weekends and major holidays. You have access to a nurse at all times for urgent questions. Please call the main number to the clinic Dept: [phone] and follow the prompts.  For any non-urgent questions, you may also contact your provider using MyChart. We now offer e-Visits for anyone 74 and older to request care online for non-urgent symptoms. For details visit [email or web address].   Also download the MyChart app! Go to the app store, search "MyChart", open the app, select [facility], and log in with your MyChart username and password.  Due to Covid, a mask is required upon entering the hospital/clinic. If you do not have a mask, one will be given to you upon arrival. For doctor visits, patients may have 1 support person aged 31 or older with them. For treatment visits, patients cannot have anyone with them due to current Covid guidelines and our immunocompromised population.   Nivolumab injection What is this medicine? NIVOLUMAB (nye VOL ue mab) is a monoclonal antibody. It treats certain types of cancer. Some of the cancers treated are colon cancer, head and neck cancer, Hodgkin lymphoma, lung cancer, and melanoma. This medicine may  be used for other purposes; ask your health care provider or pharmacist if you have questions. COMMON BRAND NAME(S): Opdivo What should I tell my health care provider before I take this medicine? They need to know if you have any of these conditions:  autoimmune diseases like Crohn's disease, ulcerative colitis, or lupus  have had or planning  to have an allogeneic stem cell transplant (uses someone else's stem cells)  history of chest radiation  history of organ transplant  nervous system problems like myasthenia gravis or Guillain-Barre syndrome  an unusual or allergic reaction to nivolumab, other medicines, foods, dyes, or preservatives  pregnant or trying to get pregnant  breast-feeding How should I use this medicine? This medicine is for infusion into a vein. It is given by a health care professional in a hospital or clinic setting. A special MedGuide will be given to you before each treatment. Be sure to read this information carefully each time. Talk to your pediatrician regarding the use of this medicine in children. While this drug may be prescribed for children as young as 12 years for selected conditions, precautions do apply. Overdosage: If you think you have taken too much of this medicine contact a poison control center or emergency room at once. NOTE: This medicine is only for you. Do not share this medicine with others. What if I miss a dose? It is important not to miss your dose. Call your doctor or health care professional if you are unable to keep an appointment. What may interact with this medicine? Interactions have not been studied. This list may not describe all possible interactions. Give your health care provider a list of all the medicines, herbs, non-prescription drugs, or dietary supplements you use. Also tell them if you smoke, drink alcohol, or use illegal drugs. Some items may interact with your medicine. What should I watch for while using this medicine? This drug may make you feel generally unwell. Continue your course of treatment even though you feel ill unless your doctor tells you to stop. You may need blood work done while you are taking this medicine. Do not become pregnant while taking this medicine or for 5 months after stopping it. Women should inform their doctor if they wish to become  pregnant or think they might be pregnant. There is a potential for serious side effects to an unborn child. Talk to your health care professional or pharmacist for more information. Do not breast-feed an infant while taking this medicine or for 5 months after stopping it. What side effects may I notice from receiving this medicine? Side effects that you should report to your doctor or health care professional as soon as possible:  allergic reactions like skin rash, itching or hives, swelling of the face, lips, or tongue  breathing problems  blood in the urine  bloody or watery diarrhea or black, tarry stools  changes in emotions or moods  changes in vision  chest pain  cough  dizziness  feeling faint or lightheaded, falls  fever, chills  headache with fever, neck stiffness, confusion, loss of memory, sensitivity to light, hallucination, loss of contact with reality, or seizures  joint pain  mouth sores  redness, blistering, peeling or loosening of the skin, including inside the mouth  severe muscle pain or weakness  signs and symptoms of high blood sugar such as dizziness; dry mouth; dry skin; fruity breath; nausea; stomach pain; increased hunger or thirst; increased urination  signs and symptoms of kidney injury like  trouble passing urine or change in the amount of urine  signs and symptoms of liver injury like dark yellow or brown urine; general ill feeling or flu-like symptoms; light-colored stools; loss of appetite; nausea; right upper belly pain; unusually weak or tired; yellowing of the eyes or skin  swelling of the ankles, feet, hands  trouble passing urine or change in the amount of urine  unusually weak or tired  weight gain or loss Side effects that usually do not require medical attention (report to your doctor or health care professional if they continue or are bothersome):  bone pain  constipation  decreased appetite  diarrhea  muscle  pain  nausea, vomiting  tiredness This list may not describe all possible side effects. Call your doctor for medical advice about side effects. You may report side effects to FDA at 1-800-FDA-1088. Where should I keep my medicine? This drug is given in a hospital or clinic and will not be stored at home. NOTE: This sheet is a summary. It may not cover all possible information. If you have questions about this medicine, talk to your doctor, pharmacist, or health care provider.  2021 Elsevier/Gold Standard (2020-02-29 10:08:25)

## 2021-04-16 ENCOUNTER — Other Ambulatory Visit: Payer: Self-pay | Admitting: Hematology and Oncology

## 2021-04-16 DIAGNOSIS — C4339 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: Secondary | ICD-10-CM

## 2021-04-16 NOTE — Progress Notes (Unsigned)
Per the patient's insurance, repeat imaging has to be done prior to further authorization for nivolumab.  Ordered CT head, neck and chest

## 2021-04-30 DIAGNOSIS — I708 Atherosclerosis of other arteries: Secondary | ICD-10-CM | POA: Diagnosis not present

## 2021-04-30 DIAGNOSIS — C439 Malignant melanoma of skin, unspecified: Secondary | ICD-10-CM | POA: Diagnosis not present

## 2021-04-30 DIAGNOSIS — I700 Atherosclerosis of aorta: Secondary | ICD-10-CM | POA: Diagnosis not present

## 2021-04-30 DIAGNOSIS — I6523 Occlusion and stenosis of bilateral carotid arteries: Secondary | ICD-10-CM | POA: Diagnosis not present

## 2021-04-30 DIAGNOSIS — C4339 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: Secondary | ICD-10-CM | POA: Diagnosis not present

## 2021-04-30 DIAGNOSIS — J984 Other disorders of lung: Secondary | ICD-10-CM | POA: Diagnosis not present

## 2021-04-30 DIAGNOSIS — I672 Cerebral atherosclerosis: Secondary | ICD-10-CM | POA: Diagnosis not present

## 2021-04-30 DIAGNOSIS — I2510 Atherosclerotic heart disease of native coronary artery without angina pectoris: Secondary | ICD-10-CM | POA: Diagnosis not present

## 2021-04-30 DIAGNOSIS — I639 Cerebral infarction, unspecified: Secondary | ICD-10-CM | POA: Diagnosis not present

## 2021-04-30 DIAGNOSIS — I6389 Other cerebral infarction: Secondary | ICD-10-CM | POA: Diagnosis not present

## 2021-05-03 ENCOUNTER — Encounter: Payer: Self-pay | Admitting: Hematology and Oncology

## 2021-05-07 ENCOUNTER — Inpatient Hospital Stay (INDEPENDENT_AMBULATORY_CARE_PROVIDER_SITE_OTHER): Payer: Medicare PPO | Admitting: Hematology and Oncology

## 2021-05-07 ENCOUNTER — Encounter: Payer: Self-pay | Admitting: Hematology and Oncology

## 2021-05-07 ENCOUNTER — Telehealth: Payer: Self-pay | Admitting: Hematology and Oncology

## 2021-05-07 ENCOUNTER — Inpatient Hospital Stay: Payer: Medicare PPO

## 2021-05-07 ENCOUNTER — Other Ambulatory Visit: Payer: Self-pay

## 2021-05-07 VITALS — BP 118/58 | HR 73 | Temp 98.0°F | Resp 14 | Ht 68.0 in | Wt 163.6 lb

## 2021-05-07 DIAGNOSIS — C4330 Malignant melanoma of unspecified part of face: Secondary | ICD-10-CM | POA: Diagnosis not present

## 2021-05-07 DIAGNOSIS — E538 Deficiency of other specified B group vitamins: Secondary | ICD-10-CM | POA: Diagnosis not present

## 2021-05-07 DIAGNOSIS — N189 Chronic kidney disease, unspecified: Secondary | ICD-10-CM | POA: Diagnosis not present

## 2021-05-07 DIAGNOSIS — C4339 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: Secondary | ICD-10-CM | POA: Diagnosis not present

## 2021-05-07 DIAGNOSIS — D649 Anemia, unspecified: Secondary | ICD-10-CM | POA: Diagnosis not present

## 2021-05-07 DIAGNOSIS — I130 Hypertensive heart and chronic kidney disease with heart failure and stage 1 through stage 4 chronic kidney disease, or unspecified chronic kidney disease: Secondary | ICD-10-CM | POA: Diagnosis not present

## 2021-05-07 DIAGNOSIS — Z79899 Other long term (current) drug therapy: Secondary | ICD-10-CM | POA: Diagnosis not present

## 2021-05-07 DIAGNOSIS — E875 Hyperkalemia: Secondary | ICD-10-CM | POA: Diagnosis not present

## 2021-05-07 DIAGNOSIS — Z5112 Encounter for antineoplastic immunotherapy: Secondary | ICD-10-CM | POA: Diagnosis not present

## 2021-05-07 DIAGNOSIS — E1122 Type 2 diabetes mellitus with diabetic chronic kidney disease: Secondary | ICD-10-CM | POA: Diagnosis not present

## 2021-05-07 LAB — BASIC METABOLIC PANEL
BUN: 25 — AB (ref 4–21)
CO2: 24 — AB (ref 13–22)
Chloride: 106 (ref 99–108)
Creatinine: 1 (ref 0.6–1.3)
Glucose: 154
Potassium: 4.5 (ref 3.4–5.3)
Sodium: 139 (ref 137–147)

## 2021-05-07 LAB — CBC AND DIFFERENTIAL
HCT: 37 — AB (ref 41–53)
Hemoglobin: 12 — AB (ref 13.5–17.5)
Neutrophils Absolute: 4.98
Platelets: 194 (ref 150–399)
WBC: 6

## 2021-05-07 LAB — CBC: RBC: 3.87 (ref 3.87–5.11)

## 2021-05-07 LAB — HEPATIC FUNCTION PANEL
ALT: 10 (ref 10–40)
AST: 17 (ref 14–40)
Alkaline Phosphatase: 53 (ref 25–125)
Bilirubin, Total: 0.3

## 2021-05-07 LAB — COMPREHENSIVE METABOLIC PANEL
Albumin: 4.1 (ref 3.5–5.0)
Calcium: 8.5 — AB (ref 8.7–10.7)

## 2021-05-07 LAB — TSH: TSH: 1.299 u[IU]/mL (ref 0.350–4.500)

## 2021-05-07 NOTE — Telephone Encounter (Signed)
Per 6/28 los next appt scheduled and given to patient

## 2021-05-07 NOTE — Progress Notes (Signed)
[facility]  [address] [place],  [place]  [address] [phone]  Clinic Day:  05/08/2021  Referring physician: [name], PA-C   CHIEF COMPLAINT:  CC:   Recurrent melanoma of the right face and B12 deficiency  Current Treatment:   Maintenance nivolumab every 4 weeks, as well as B12 every 4 weeks   HISTORY OF PRESENT ILLNESS:  Kevin [name] is a 80 y.o. 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  His original lesion was diagnosed in March 2016.  He underwent excision, but refused aggressive surgery.  The lesion was at least 4 mm thick with a Clark's level 4, and a mitotic index every 10/mm.  The margins were positive, but reexcision revealed no residual melanoma.  He had recurrence within 2 months and the lesion had been steadily growing.  He initially had refused seeing a medical oncologist, but finally came to [place] for evaluation and treatment in October 2016.  He does have extensive comorbidities including COPD, diabetes, hypertension, coronary artery disease, history of myocardial infarction, and pacemaker with AICD.  PET revealed hypermetabolism in the right submandibular node, but no evidence of distant metastasis.  He was having significant pain and requiring regular hydrocodone doses.  He also had severe disfigurement and avoided going out in public because of the facial lesion.  He has been receiving palliative nivolumab since November 2016 and has had a good response with a decrease in the right facial tumor.  He has had associated hypomagnesemia and hypokalemia, so is on oral magnesium and potassium supplements. He has also intermittently required IV magnesium and potassium supplementation.  He has had mild chronic anemia, which has been stable. He was found to have B12 deficiency and continues B12 injections monthly.  CT head/neck in May 2017 revealed some nonspecific skin thickening in the right face in the area of his  melanoma in the right submandibular lymph node had decreased in size, now measuring 6 x 12 mm, but still prominent when compared to the left submandibular node.  He had 15 cycles of nivolumab and relocated to continue to get the same treatment.  He had his 30th dose in February 2018 and then returned to the [place] area.    However, he was admitted to the hospital in early March 2018 for congestive heart failure associated with pleural and pericardial effusions.  He also had changes in the lung and it was unclear as to how much of this was fluid overload versus infection versus pneumonitis from the immunotherapy.  He had no leukocytosis, fever or purulent sputum.  The echocardiogram revealed severe diffuse hypokinesis with mild concentric hypertrophy and markedly depressed ejection fraction of 20 to 25%, associated with moderate mitral regurgitation and moderate left atrial dilatation.  He improved with diuresis.  The pleural effusions were felt to be most likely secondary to congestive heart failure.  He was also placed on corticosteroids due to the possibility of side effects of immunotherapy, but these were subsequently discontinued.  CT chest in March 2018 revealed stable cardiomegaly and stable small pericardial effusion, but no evidence of immune mediated pneumonitis or metastatic disease.  The previously seen bilateral pleural effusions had resolved.  He returned to our care and nivolumab every 2 weeks was resumed in April.  CT chest was repeated in early May after 1 month back on therapy.  This was stable, without evidence of recurrent pleural effusions, immunotherapy toxicities, or evidence of metastatic disease.  He was switched  to every 28-day nivolumab in August 2018.  He has had stable disease so has continued on nivolumab.  He remains on both furosemide and spironolactone.  CT imaging in October 2018 revealed a stable 5 mm level 1B cervical lymph node, as well as a stable 8 mm exophytic lesion of  the right face.  CT chest did not reveal any evidence of metastatic disease. Borderline cardiomegaly and a small amount of pericardial fluid/thickening was seen, but not reaccumulation of the pleural effusions.  CT head did not reveal any evidence of intracranial metastasis.  There were chronic ischemic changes and evidence of old infarcts.  He has continued palliative nivolumab.   CT neck and chest in February 2019 were stable.  He was admitted to [facility] in May 2019 twice with severe hypoglycemia.  Repeat imaging in May did not reveal any progressive disease.  His nivolumab was held in May, but resumed at the end of June.  He did not have a dose in 06/06/2023.  Unfortunately, his wife passed away in Jun 05, 2018 and she was his primary caregiver.  His daughter has been caring for him since.  CT neck and chest in November 2019 did not reveal any evidence of recurrent or metastatic disease.  CT imaging in January 2020 did not reveal evidence of recurrence, so he has continued nivolumab every 4 weeks.  He was given mirtazapine for depression and weight loss the dose was increased to 15 mg.  CT scans in June 2020 remained stable with a small pericardial effusion and bilateral renal cysts. CT neck, chest and abdomen in November 2020 did not reveal any evidence of metastasis.    CT head, neck and chest from May and October 2021 were negative for evidence of metastatic disease.  He was then lost to follow-up from December 2021 to April 2022, at which time he resumed nivolumab and B12 every 4 weeks.   INTERVAL HISTORY:  Kevin [name] is here today for repeat clinical assessment prior to another cycle of nivolumab  He states he continues to do well and denies complaints. He denies cough, dyspnea, diarrhea or increased skin rash.  He has persistent mild rash of the forehead, which is improved. He denies pain today. He denies fevers or chills. His appetite is good. His weight has increased 7 pounds over last 4 weeks . He denies  lower extremity edema.  His daughter fixes his medications, so he is not sure what he is taking. Prior to his visit today he had repeat CT head, neck and chest as requested by his insurance company.  REVIEW OF SYSTEMS:  Review of Systems  Constitutional:  Negative for appetite change, chills, fatigue, fever and unexpected weight change.  HENT:   Negative for lump/mass, mouth sores and sore throat.   Respiratory:  Negative for cough and shortness of breath.   Cardiovascular:  Negative for chest pain and leg swelling.  Gastrointestinal:  Negative for abdominal pain, constipation, diarrhea, nausea and vomiting.  Genitourinary:  Negative for difficulty urinating, dysuria, frequency and hematuria.   Musculoskeletal:  Negative for arthralgias, back pain and myalgias.  Skin:  Negative for itching, rash and wound.  Neurological:  Negative for dizziness, extremity weakness, headaches, light-headedness and numbness.  Hematological:  Negative for adenopathy.  Psychiatric/Behavioral:  Negative for depression and sleep disturbance. The patient is not nervous/anxious.    VITALS:  Blood pressure (!) 118/58, pulse 73, temperature 98 F (36.7 C), temperature source Oral, resp. rate 14, height 5\' 8"  (1.727 m),  weight 163 lb 9 oz (74.2 kg), SpO2 97 %.  Wt Readings from Last 3 Encounters:  05/07/21 163 lb 9 oz (74.2 kg)  04/10/21 158 lb 1.3 oz (71.7 kg)  04/09/21 156 lb 6.4 oz (70.9 kg)    Body mass index is 24.87 kg/m.  Performance status (ECOG): 1 - Symptomatic but completely ambulatory  PHYSICAL EXAM:  Physical Exam Vitals and nursing note reviewed.  Constitutional:      General: He is not in acute distress.    Appearance: Normal appearance. He is normal weight.  HENT:     Head: Normocephalic and atraumatic.     Mouth/Throat:     Mouth: Mucous membranes are moist.     Pharynx: Oropharynx is clear. No oropharyngeal exudate or posterior oropharyngeal erythema.  Eyes:     General: No scleral  icterus.    Extraocular Movements: Extraocular movements intact.     Conjunctiva/sclera: Conjunctivae normal.     Pupils: Pupils are equal, round, and reactive to light.  Cardiovascular:     Rate and Rhythm: Normal rate and regular rhythm.     Heart sounds: Normal heart sounds. No murmur heard.   No friction rub. No gallop.  Pulmonary:     Effort: Pulmonary effort is normal.     Breath sounds: Normal breath sounds. No wheezing, rhonchi or rales.  Chest:  Breasts:    Right: No axillary adenopathy or supraclavicular adenopathy.     Left: No axillary adenopathy or supraclavicular adenopathy.  Abdominal:     General: Bowel sounds are normal. There is no distension.     Palpations: Abdomen is soft. There is no hepatomegaly, splenomegaly or mass.     Tenderness: There is no abdominal tenderness.  Musculoskeletal:        General: Normal range of motion.     Cervical back: Normal range of motion and neck supple. No tenderness.     Right lower leg: No edema.     Left lower leg: No edema.  Lymphadenopathy:     Cervical: No cervical adenopathy.     Upper Body:     Right upper body: No supraclavicular or axillary adenopathy.     Left upper body: No supraclavicular or axillary adenopathy.     Lower Body: No right inguinal adenopathy. No left inguinal adenopathy.  Skin:    General: Skin is warm and dry.     Coloration: Skin is not jaundiced.     Findings: Rash present.     Comments: There is persistent thickening of the right cheek without obvious recurrent disease. There is a chronic maculopapular erythematous rash of the forehead, which is improved  Neurological:     Mental Status: He is alert and oriented to person, place, and time.     Cranial Nerves: No cranial nerve deficit.  Psychiatric:        Mood and Affect: Mood normal.        Behavior: Behavior normal.        Thought Content: Thought content normal.   LABS:   CBC Latest Ref Rng & Units 05/07/2021 04/09/2021 03/12/2021  WBC -  6.0 3.9 4.8  Hemoglobin 13.5 - 17.5 12.0(A) 12.2(A) 12.3(A)  Hematocrit 41 - 53 37(A) 37(A) 38(A)  Platelets 150 - 399 194 204 196   CMP Latest Ref Rng & Units 05/07/2021 04/09/2021 03/12/2021  Glucose 70 - 99 mg/dL - - -  BUN 4 - 21 25(A) 29(A) 17  Creatinine 0.6 - 1.3 1.0 1.1 0.9  Sodium 137 - 147 139 138 136(A)  Potassium 3.4 - 5.3 4.5 5.4(A) 5.1  Chloride 99 - 108 106 106 103  CO2 13 - 22 24(A) 23(A) 25(A)  Calcium 8.7 - 10.7 8.5(A) 9.3 9.3  Total Protein 6.5 - 8.1 g/dL - - -  Total Bilirubin 0.3 - 1.2 mg/dL - - -  Alkaline Phos 25 - 125 53 59 66  AST 14 - 40 17 18 17   ALT 10 - 40 10 12 12      No results found for: CEA1 / No results found for: CEA1 No results found for: PSA1 No results found for: OIN867 No results found for: CAN125  No results found for: TOTALPROTELP, ALBUMINELP, A1GS, A2GS, BETS, BETA2SER, GAMS, MSPIKE, SPEI No results found for: TIBC, FERRITIN, IRONPCTSAT No results found for: LDH  STUDIES:  No results found.  Exam(s): [id number] CT/CT HEAD WO/W CM CLINICAL DATA:  Melanoma  EXAM: CT HEAD WITHOUT AND WITH CONTRAST  TECHNIQUE: Contiguous axial images were obtained from the base of the skull through the vertex without and with intravenous contrast  CONTRAST:  100 mL Isovue 370  COMPARISON:  08/10/2020  FINDINGS: Brain: There is no acute intracranial hemorrhage, mass, mass effect, or edema. No abnormal enhancement. Gray-white differentiation is preserved. There is no extra-axial fluid collection. Ventricles and sulci are stable in size and configuration.  Patchy low-attenuation in the supratentorial white matter is nonspecific but may reflect stable chronic microvascular ischemic changes. There is a small chronic infarct of the parasagittal left frontal lobe. Small chronic bilateral basal ganglia and right thalamocapsular infarcts.  Vascular: There is atherosclerotic calcification at the skull base.  Skull: Calvarium is  unremarkable.  Sinuses/Orbits: No acute finding.  Other: None.  IMPRESSION: No evidence of intracranial metastatic disease. Stable chronic findings detailed above.  Exam(s): [id number] CT/CT NECK W/ CM CLINICAL DATA:  Melanoma  EXAM: CT NECK WITH CONTRAST  TECHNIQUE: Multidetector CT imaging of the neck was performed using the standard protocol following the bolus administration of intravenous contrast.  CONTRAST:  100 mL Isovue 370  COMPARISON:  08/10/2020  FINDINGS: Pharynx and larynx: Unremarkable.  No mass or swelling.  Salivary glands: Parotid and submandibular glands are unremarkable.  Thyroid: Normal.  Lymph nodes: No enlarged or abnormal density nodes.  Vascular: Major neck vessels are patent. Calcified plaque at the ICA origins.  Limited intracranial: Dictated separately.  Visualized orbits: Unremarkable.  Mastoids and visualized paranasal sinuses: No significant opacification.  Skeleton: Degenerative changes of the spine are similar in appearance.  Upper chest: Right chest wall port catheter is partially seen within the superior vena cava. Left chest wall ICD. Lungs are more completely evaluated on concurrent dedicated chest imaging.  Other: None.  IMPRESSION: No neck mass or adenopathy.  Exam(s): [id number] CT/CT CHEST W/ CM CLINICAL DATA:  Melanoma.  EXAM: CT CHEST WITH CONTRAST  TECHNIQUE: Multidetector CT imaging of the chest was performed during intravenous contrast administration.  CONTRAST:  100 cc Isovue 370.  COMPARISON:  08/10/2020 and CT abdomen 09/21/2019.  FINDINGS: Cardiovascular: Right IJ Port-A-Cath terminates in the SVC. Atherosclerotic calcification of the aorta, aortic valve and coronary arteries. Heart is enlarged with left ventricular dilatation. Small amount of pericardial fluid is similar.  Mediastinum/Nodes: No pathologically enlarged mediastinal, hilar or axillary lymph nodes. Esophagus is grossly  unremarkable.  Lungs/Pleura: Minimal scarring in the right middle lobe and lingula. Lungs are otherwise clear. No pleural fluid. Adherent debris in the airway.  Upper Abdomen: Visualized portion of the liver is  unremarkable. Stones layer in the gallbladder. Slight nodular thickening of the adrenal glands. Low-attenuation lesions in the kidneys measure up to 5.8 cm on the left and are likely cysts. 2.6 cm hyperattenuating lesion off the upper pole left kidney cannot be further characterized without precontrast imaging but is stable in size from 09/21/2019. Visualized portions of the spleen, pancreas, stomach and bowel are grossly unremarkable. No upper abdominal adenopathy.  Musculoskeletal: Degenerative changes in the spine. No worrisome lytic or sclerotic lesions.  IMPRESSION: 1. No evidence of metastatic disease. 2. Small amount of pericardial fluid, similar. 3. Cholelithiasis. 4. Aortic atherosclerosis (ICD10-I70.0). Coronary artery calcification.  HISTORY:   Past Medical History:  Diagnosis Date   Cancer ([facility])    CHF (congestive heart failure) ([facility])    COPD (chronic obstructive pulmonary disease) (HCC)    Diabetes mellitus without complication ([facility])    History of myocardial infarction    Hyperlipidemia    Hypertension    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)    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)    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) ([place])     Past Surgical History:  Procedure Laterality Date   APPENDECTOMY     CARDIAC CATHETERIZATION     CORONARY ANGIOPLASTY     PACEMAKER INSERTION      Family History  Problem Relation Age of Onset   Diabetes Mellitus II Father    Breast cancer Mother    Ovarian cancer Sister    Cancer Brother    Cancer Maternal Uncle    Breast cancer Niece    Cancer Brother     Social History:  reports that he has been smoking cigarettes. He has a 60.00 pack-year smoking history. He has  never used smokeless tobacco. He reports previous alcohol use. He reports previous drug use.The patient is alone today.  Allergies: No Known Allergies  Current Medications: Current Outpatient Medications  Medication Sig Dispense Refill   atorvastatin (LIPITOR) 80 MG tablet Take 80 mg by mouth at bedtime.     budesonide-formoterol (SYMBICORT) 80-4.5 MCG/ACT inhaler Inhale 2 puffs into the lungs 2 (two) times daily.     clotrimazole-betamethasone (LOTRISONE) cream Apply 1 application topically 2 (two) times daily. 30 g 0   Ferrous Fumarate (HEMOCYTE - 106 MG FE) 324 (106 Fe) MG TABS tablet Take 1 tablet by mouth.     furosemide (LASIX) 40 MG tablet Take 40 mg by mouth daily.     glimepiride (AMARYL) 4 MG tablet Take by mouth.     lisinopril (ZESTRIL) 10 MG tablet Take 10 mg by mouth daily.     magnesium oxide (MAG-OX) 400 (241.3 Mg) MG tablet Take 400 mg by mouth 2 (two) times daily.     metFORMIN (GLUCOPHAGE) 1000 MG tablet Take 1,000 mg by mouth 2 (two) times daily.     metFORMIN (GLUCOPHAGE) 500 MG tablet Take 1,000 mg by mouth 2 (two) times daily with a meal.     mirtazapine (REMERON) 15 MG tablet Take 15 mg by mouth at bedtime.     ondansetron (ZOFRAN) 4 MG tablet Take 1 tablet (4 mg total) by mouth every 4 (four) hours as needed for nausea. (Patient not taking: Reported on 05/07/2021) 90 tablet 3   ondansetron (ZOFRAN-ODT) 4 MG disintegrating tablet Take by mouth at bedtime. (Patient not taking: Reported on 05/07/2021)     pioglitazone (ACTOS) 30 MG tablet Take 30 mg by mouth daily.     polyethylene glycol (  MIRALAX / GLYCOLAX) 17 g packet Take 17 g by mouth daily. (Patient not taking: Reported on 05/07/2021)     prochlorperazine (COMPAZINE) 10 MG tablet Take 10 mg by mouth every 6 (six) hours as needed for nausea or vomiting. (Patient not taking: Reported on 05/07/2021)     sitaGLIPtin (JANUVIA) 100 MG tablet Take 100 mg by mouth daily.     spironolactone (ALDACTONE) 25 MG tablet Take 25 mg  by mouth daily.     triamcinolone (KENALOG) 0.025 % cream Apply 1 application topically 2 (two) times daily.     No current facility-administered medications for this visit.     ASSESSMENT & PLAN:   Assessment:  1. Recurrent/metastatic melanoma, which remains controlled with palliative nivolumab. Repeat CT imaging does not reveal any evidence of metastatic disease.   2. B12 deficiency, for which he resumed monthly B12 injections in April with improvement of his hemoglobin.   3. History of chronic kidney disease, BUN and creatinine normalized.   4. Congestive heart failure and pacemaker.   5. Mild anemia felt to be due to iron deficiency, for which he continues oral iron supplement daily.    6. Mild hypomagnesemia. He will continue magnesium oxide 400 mg supplement twice daily to prevent worsening. I sent a note with him for his daughter to ensure he is taking this.   7. Tobacco abuse, we discussed the importance of complete abstinence from tobacco again today. The patient is still not motivated to quit at this time.    8. Mild hyperkalemia, felt to be due to dehydration.  This is resolved.  Plan:   He will proceed with nivolumab and B12 this week.  He knows to continue iron and magnesium as prescribed. I will plan to see him in 4 weeks with a CBC, comprehensive metabolic panel, TSH and T4 prior to his next nivolumab and B12. The patient understands the plans discussed today and is in agreement with them.  He knows to contact our office if he develops concerns prior to his next appointment.     [name], PA-C

## 2021-05-08 ENCOUNTER — Inpatient Hospital Stay: Payer: Medicare PPO

## 2021-05-08 ENCOUNTER — Encounter: Payer: Self-pay | Admitting: Hematology and Oncology

## 2021-05-08 ENCOUNTER — Encounter: Payer: Self-pay | Admitting: Oncology

## 2021-05-09 ENCOUNTER — Encounter: Payer: Self-pay | Admitting: Oncology

## 2021-05-09 LAB — T4: T4, Total: 6.4 ug/dL (ref 4.5–12.0)

## 2021-05-10 ENCOUNTER — Other Ambulatory Visit: Payer: Self-pay | Admitting: Pharmacist

## 2021-05-14 ENCOUNTER — Other Ambulatory Visit: Payer: Self-pay

## 2021-05-14 ENCOUNTER — Inpatient Hospital Stay: Payer: Medicare PPO | Attending: Oncology

## 2021-05-14 VITALS — BP 100/58 | HR 77 | Temp 98.0°F | Resp 18 | Ht 68.0 in | Wt 158.8 lb

## 2021-05-14 DIAGNOSIS — Z5112 Encounter for antineoplastic immunotherapy: Secondary | ICD-10-CM | POA: Insufficient documentation

## 2021-05-14 DIAGNOSIS — E538 Deficiency of other specified B group vitamins: Secondary | ICD-10-CM | POA: Diagnosis not present

## 2021-05-14 DIAGNOSIS — Z79899 Other long term (current) drug therapy: Secondary | ICD-10-CM | POA: Diagnosis not present

## 2021-05-14 DIAGNOSIS — C4339 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: Secondary | ICD-10-CM

## 2021-05-14 DIAGNOSIS — F17210 Nicotine dependence, cigarettes, uncomplicated: Secondary | ICD-10-CM | POA: Diagnosis not present

## 2021-05-14 DIAGNOSIS — C4330 Malignant melanoma of unspecified part of face: Secondary | ICD-10-CM | POA: Insufficient documentation

## 2021-05-14 MED ORDER — CYANOCOBALAMIN 1000 MCG/ML IJ SOLN
INTRAMUSCULAR | Status: AC
  Filled 2021-05-14: qty 1

## 2021-05-14 MED ORDER — CYANOCOBALAMIN 1000 MCG/ML IJ SOLN
1000.0000 ug | Freq: Once | INTRAMUSCULAR | Status: AC
  Administered 2021-05-14: 1000 ug via INTRAMUSCULAR

## 2021-05-14 MED ORDER — SODIUM CHLORIDE 0.9 % IV SOLN
480.0000 mg | Freq: Once | INTRAVENOUS | Status: AC
  Administered 2021-05-14: 480 mg via INTRAVENOUS
  Filled 2021-05-14 (×2): qty 48

## 2021-05-14 MED ORDER — HEPARIN SOD (PORK) LOCK FLUSH 100 UNIT/ML IV SOLN
500.0000 [IU] | Freq: Once | INTRAVENOUS | Status: AC | PRN
  Administered 2021-05-14: 500 [IU]
  Filled 2021-05-14: qty 5

## 2021-05-14 MED ORDER — SODIUM CHLORIDE 0.9 % IV SOLN
Freq: Once | INTRAVENOUS | Status: AC
  Filled 2021-05-14: qty 250

## 2021-05-14 NOTE — Patient Instructions (Signed)
[facility]  Discharge Instructions: Thank you for choosing [facility] to provide your oncology and hematology care.  If you have a lab appointment with the Cancer Center, please go directly to the Cancer Center and check in at the registration area.   Wear comfortable clothing and clothing appropriate for easy access to any Portacath or PICC line.   We strive to give you quality time with your provider. You may need to reschedule your appointment if you arrive late (15 or more minutes).  Arriving late affects you and other patients whose appointments are after yours.  Also, if you miss three or more appointments without notifying the office, you may be dismissed from the clinic at the provider's discretion.      For prescription refill requests, have your pharmacy contact our office and allow 72 hours for refills to be completed.    Today you received the following chemotherapy and/or immunotherapy agents Nivolumab     To help prevent nausea and vomiting after your treatment, we encourage you to take your nausea medication as directed.  BELOW ARE SYMPTOMS THAT SHOULD BE REPORTED IMMEDIATELY: *FEVER GREATER THAN 100.4 F (38 C) OR HIGHER *CHILLS OR SWEATING *NAUSEA AND VOMITING THAT IS NOT CONTROLLED WITH YOUR NAUSEA MEDICATION *UNUSUAL SHORTNESS OF BREATH *UNUSUAL BRUISING OR BLEEDING *URINARY PROBLEMS (pain or burning when urinating, or frequent urination) *BOWEL PROBLEMS (unusual diarrhea, constipation, pain near the anus) TENDERNESS IN MOUTH AND THROAT WITH OR WITHOUT PRESENCE OF ULCERS (sore throat, sores in mouth, or a toothache) UNUSUAL RASH, SWELLING OR PAIN  UNUSUAL VAGINAL DISCHARGE OR ITCHING   Items with * indicate a potential emergency and should be followed up as soon as possible or go to the Emergency Department if any problems should occur.  Please show the CHEMOTHERAPY ALERT CARD or IMMUNOTHERAPY ALERT CARD at check-in to the  Emergency Department and triage nurse.  Should you have questions after your visit or need to cancel or reschedule your appointment, please contact [facility]  Dept: [phone]  and follow the prompts.  Office hours are 8:00 a.m. to 4:30 p.m. Monday - Friday. Please note that voicemails left after 4:00 p.m. may not be returned until the following business day.  We are closed weekends and major holidays. You have access to a nurse at all times for urgent questions. Please call the main number to the clinic Dept: [phone] and follow the prompts.  For any non-urgent questions, you may also contact your provider using MyChart. We now offer e-Visits for anyone 18 and older to request care online for non-urgent symptoms. For details visit [email or web address].   Also download the MyChart app! Go to the app store, search "MyChart", open the app, select [facility], and log in with your MyChart username and password.  Due to Covid, a mask is required upon entering the hospital/clinic. If you do not have a mask, one will be given to you upon arrival. For doctor visits, patients may have 1 support person aged 18 or older with them. For treatment visits, patients cannot have anyone with them due to current Covid guidelines and our immunocompromised population.    

## 2021-05-28 ENCOUNTER — Encounter: Payer: Self-pay | Admitting: Oncology

## 2021-06-04 ENCOUNTER — Encounter: Payer: Self-pay | Admitting: Hematology and Oncology

## 2021-06-04 ENCOUNTER — Inpatient Hospital Stay (INDEPENDENT_AMBULATORY_CARE_PROVIDER_SITE_OTHER): Payer: Medicare PPO | Admitting: Hematology and Oncology

## 2021-06-04 ENCOUNTER — Other Ambulatory Visit: Payer: Self-pay

## 2021-06-04 ENCOUNTER — Telehealth: Payer: Self-pay | Admitting: Hematology and Oncology

## 2021-06-04 ENCOUNTER — Inpatient Hospital Stay: Payer: Medicare PPO

## 2021-06-04 VITALS — BP 126/78 | HR 76 | Temp 97.9°F | Resp 16 | Ht 68.0 in | Wt 156.4 lb

## 2021-06-04 DIAGNOSIS — C4339 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: Secondary | ICD-10-CM | POA: Diagnosis not present

## 2021-06-04 DIAGNOSIS — E538 Deficiency of other specified B group vitamins: Secondary | ICD-10-CM | POA: Diagnosis not present

## 2021-06-04 DIAGNOSIS — Z5112 Encounter for antineoplastic immunotherapy: Secondary | ICD-10-CM | POA: Diagnosis not present

## 2021-06-04 DIAGNOSIS — F17210 Nicotine dependence, cigarettes, uncomplicated: Secondary | ICD-10-CM | POA: Diagnosis not present

## 2021-06-04 DIAGNOSIS — Z79899 Other long term (current) drug therapy: Secondary | ICD-10-CM | POA: Diagnosis not present

## 2021-06-04 DIAGNOSIS — C4330 Malignant melanoma of unspecified part of face: Secondary | ICD-10-CM | POA: Diagnosis not present

## 2021-06-04 DIAGNOSIS — D649 Anemia, unspecified: Secondary | ICD-10-CM | POA: Diagnosis not present

## 2021-06-04 LAB — BASIC METABOLIC PANEL
BUN: 27 — AB (ref 4–21)
CO2: 27 — AB (ref 13–22)
Chloride: 105 (ref 99–108)
Creatinine: 1.1 (ref 0.6–1.3)
Glucose: 138
Potassium: 4.3 (ref 3.4–5.3)
Sodium: 143 (ref 137–147)

## 2021-06-04 LAB — COMPREHENSIVE METABOLIC PANEL
Albumin: 4.6 (ref 3.5–5.0)
Calcium: 9.2 (ref 8.7–10.7)

## 2021-06-04 LAB — HEPATIC FUNCTION PANEL
ALT: 14 (ref 10–40)
AST: 22 (ref 14–40)
Alkaline Phosphatase: 65 (ref 25–125)
Bilirubin, Total: 0.3

## 2021-06-04 LAB — TSH: TSH: 2.158 u[IU]/mL (ref 0.350–4.500)

## 2021-06-04 LAB — CBC: RBC: 3.97 (ref 3.87–5.11)

## 2021-06-04 LAB — CBC AND DIFFERENTIAL
HCT: 38 — AB (ref 41–53)
Hemoglobin: 12.4 — AB (ref 13.5–17.5)
Neutrophils Absolute: 2.48
Platelets: 200 (ref 150–399)
WBC: 3.7

## 2021-06-04 NOTE — Telephone Encounter (Signed)
Per 7/26 los next apt scheduled and given to patient

## 2021-06-04 NOTE — Progress Notes (Signed)
[facility]  [address] [place],  [place]  [address] [phone]  Clinic Day:  06/04/2021  Referring physician: [name], PA-C   CHIEF COMPLAINT:  CC:   Recurrent melanoma of the right face and B12 deficiency  Current Treatment:   Maintenance nivolumab every 4 weeks, as well as B12 every 4 weeks   HISTORY OF PRESENT ILLNESS:  Kevin [name] is a 80 y.o. 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  His original lesion was diagnosed in March 2016.  He underwent excision, but refused aggressive surgery.  The lesion was at least 4 mm thick with a Clark's level 4, and a mitotic index every 10/mm.  The margins were positive, but reexcision revealed no residual melanoma.  He had recurrence within 2 months and the lesion had been steadily growing.  He initially had refused seeing a medical oncologist, but finally came to [place] for evaluation and treatment in October 2016.  He does have extensive comorbidities including COPD, diabetes, hypertension, coronary artery disease, history of myocardial infarction, and pacemaker with AICD.  PET revealed hypermetabolism in the right submandibular node, but no evidence of distant metastasis.  He was having significant pain and requiring regular hydrocodone doses.  He also had severe disfigurement and avoided going out in public because of the facial lesion.  He has been receiving palliative nivolumab since November 2016 and has had a good response with a decrease in the right facial tumor.  He has had associated hypomagnesemia and hypokalemia, so is on oral magnesium and potassium supplements. He has also intermittently required IV magnesium and potassium supplementation.  He has had mild chronic anemia, which has been stable. He was found to have B12 deficiency and continues B12 injections monthly.  CT head/neck in May 2017 revealed some nonspecific skin thickening in the right face in the area of his  melanoma in the right submandibular lymph node had decreased in size, now measuring 6 x 12 mm, but still prominent when compared to the left submandibular node.  He had 15 cycles of nivolumab and relocated to continue to get the same treatment.  He had his 30th dose in February 2018 and then returned to the [place] area.    However, he was admitted to the hospital in early March 2018 for congestive heart failure associated with pleural and pericardial effusions.  He also had changes in the lung and it was unclear as to how much of this was fluid overload versus infection versus pneumonitis from the immunotherapy.  He had no leukocytosis, fever or purulent sputum.  The echocardiogram revealed severe diffuse hypokinesis with mild concentric hypertrophy and markedly depressed ejection fraction of 20 to 25%, associated with moderate mitral regurgitation and moderate left atrial dilatation.  He improved with diuresis.  The pleural effusions were felt to be most likely secondary to congestive heart failure.  He was also placed on corticosteroids due to the possibility of side effects of immunotherapy, but these were subsequently discontinued.  CT chest in March 2018 revealed stable cardiomegaly and stable small pericardial effusion, but no evidence of immune mediated pneumonitis or metastatic disease.  The previously seen bilateral pleural effusions had resolved.  He returned to our care and nivolumab every 2 weeks was resumed in April.  CT chest was repeated in early May after 1 month back on therapy.  This was stable, without evidence of recurrent pleural effusions, immunotherapy toxicities, or evidence of metastatic disease.  He was switched  to every 28-day nivolumab in August 2018.  He has had stable disease so has continued on nivolumab.  He remains on both furosemide and spironolactone.  CT imaging in October 2018 revealed a stable 5 mm level 1B cervical lymph node, as well as a stable 8 mm exophytic lesion of  the right face.  CT chest did not reveal any evidence of metastatic disease. Borderline cardiomegaly and a small amount of pericardial fluid/thickening was seen, but not reaccumulation of the pleural effusions.  CT head did not reveal any evidence of intracranial metastasis.  There were chronic ischemic changes and evidence of old infarcts.  He has continued palliative nivolumab.   CT neck and chest in February 2019 were stable.  He was admitted to [facility] in May 2019 twice with severe hypoglycemia.  Repeat imaging in May did not reveal any progressive disease.  His nivolumab was held in May, but resumed at the end of June.  He did not have a dose in 2023/06/05.  Unfortunately, his wife passed away in 06-04-2018 and she was his primary caregiver.  His daughter has been caring for him since.  CT neck and chest in November 2019 did not reveal any evidence of recurrent or metastatic disease.  CT imaging in January 2020 did not reveal evidence of recurrence, so he has continued nivolumab every 4 weeks.  He was given mirtazapine for depression and weight loss the dose was increased to 15 mg.  CT scans in June 2020 remained stable with a small pericardial effusion and bilateral renal cysts. CT neck, chest and abdomen in November 2020 did not reveal any evidence of metastasis.    CT head, neck and chest from May and October 2021 were negative for evidence of metastatic disease.  He was then lost to follow-up from December 2021 to April 2022, at which time he resumed nivolumab and B12 every 4 weeks.   CT head, neck and chest in June did not reveal any evidence of progressive disease.  INTERVAL HISTORY:  Kevin [name] is here today for repeat clinical assessment prior to another cycle of nivolumab, but this appointment has been scheduled a week early. He is not due for his nivolumab until August 2nd, as his last dose was given on July 5th.  He states he continues to do well and denies complaints. He denies cough, dyspnea,  diarrhea or rash.  He states he was stung by a hornet last week, but this healed. He denies pain today. He denies fevers or chills. His appetite is good. His weight has decreased 7.5 pounds over last 3 weeks . His weight had increased by 7 lb at the last visit.   REVIEW OF SYSTEMS:  Review of Systems  Constitutional:  Negative for appetite change, chills, fatigue, fever and unexpected weight change.  HENT:   Negative for lump/mass, mouth sores and sore throat.   Respiratory:  Negative for cough and shortness of breath.   Cardiovascular:  Negative for chest pain and leg swelling.  Gastrointestinal:  Negative for abdominal pain, constipation, diarrhea, nausea and vomiting.  Genitourinary:  Negative for difficulty urinating, dysuria, frequency and hematuria.   Musculoskeletal:  Negative for arthralgias, back pain and myalgias.  Skin:  Negative for itching, rash and wound.  Neurological:  Negative for dizziness, extremity weakness, headaches, light-headedness and numbness.  Hematological:  Negative for adenopathy.  Psychiatric/Behavioral:  Negative for depression and sleep disturbance. The patient is not nervous/anxious.    VITALS:  Blood pressure 126/78, pulse 76,  temperature 97.9 F (36.6 C), resp. rate 16, height '5\' 8"'$  (1.727 m), weight 156 lb 6.4 oz (70.9 kg), SpO2 97 %.  Wt Readings from Last 3 Encounters:  06/04/21 156 lb 6.4 oz (70.9 kg)  05/14/21 158 lb 12 oz (72 kg)  05/07/21 163 lb 9 oz (74.2 kg)    Body mass index is 23.78 kg/m.  Performance status (ECOG): 1 - Symptomatic but completely ambulatory  PHYSICAL EXAM:  Physical Exam Vitals and nursing note reviewed.  Constitutional:      General: He is not in acute distress.    Appearance: Normal appearance. He is normal weight.  HENT:     Head: Normocephalic and atraumatic.     Mouth/Throat:     Mouth: Mucous membranes are moist.     Pharynx: Oropharynx is clear. No oropharyngeal exudate or posterior oropharyngeal erythema.   Eyes:     General: No scleral icterus.    Extraocular Movements: Extraocular movements intact.     Conjunctiva/sclera: Conjunctivae normal.     Pupils: Pupils are equal, round, and reactive to light.  Cardiovascular:     Rate and Rhythm: Normal rate and regular rhythm.     Heart sounds: Normal heart sounds. No murmur heard.   No friction rub. No gallop.  Pulmonary:     Effort: Pulmonary effort is normal.     Breath sounds: Normal breath sounds. No wheezing, rhonchi or rales.  Chest:  Breasts:    Right: No axillary adenopathy or supraclavicular adenopathy.     Left: No axillary adenopathy or supraclavicular adenopathy.  Abdominal:     General: Bowel sounds are normal. There is no distension.     Palpations: Abdomen is soft. There is no hepatomegaly, splenomegaly or mass.     Tenderness: There is no abdominal tenderness.  Musculoskeletal:        General: Normal range of motion.     Cervical back: Normal range of motion and neck supple. No tenderness.     Right lower leg: No edema.     Left lower leg: No edema.  Lymphadenopathy:     Cervical: No cervical adenopathy.     Upper Body:     Right upper body: No supraclavicular or axillary adenopathy.     Left upper body: No supraclavicular or axillary adenopathy.     Lower Body: No right inguinal adenopathy. No left inguinal adenopathy.  Skin:    General: Skin is warm and dry.     Coloration: Skin is not jaundiced.     Findings: Rash present.     Comments: There is persistent thickening of the right cheek without obvious recurrent disease. There is a chronic, mild, maculopapular erythematous rash of the forehead  Neurological:     Mental Status: He is alert and oriented to person, place, and time.     Cranial Nerves: No cranial nerve deficit.  Psychiatric:        Mood and Affect: Mood normal.        Behavior: Behavior normal.        Thought Content: Thought content normal.   LABS:   CBC Latest Ref Rng & Units 06/04/2021  05/07/2021 04/09/2021  WBC - 3.7 6.0 3.9  Hemoglobin 13.5 - 17.5 12.4(A) 12.0(A) 12.2(A)  Hematocrit 41 - 53 38(A) 37(A) 37(A)  Platelets 150 - 399 200 194 204   CMP Latest Ref Rng & Units 06/04/2021 05/07/2021 04/09/2021  Glucose 70 - 99 mg/dL - - -  BUN 4 - 21 27(A)  25(A) 29(A)  Creatinine 0.6 - 1.3 1.1 1.0 1.1  Sodium 137 - 147 143 139 138  Potassium 3.4 - 5.3 4.3 4.5 5.4(A)  Chloride 99 - 108 105 106 106  CO2 13 - 22 27(A) 24(A) 23(A)  Calcium 8.7 - 10.7 9.2 8.5(A) 9.3  Total Protein 6.5 - 8.1 g/dL - - -  Total Bilirubin 0.3 - 1.2 mg/dL - - -  Alkaline Phos 25 - 125 65 53 59  AST 14 - 40 '22 17 18  '$ ALT 10 - 40 '14 10 12     '$ No results found for: CEA1 / No results found for: CEA1 No results found for: PSA1 No results found for: [id number] No results found for: CAN125  No results found for: TOTALPROTELP, ALBUMINELP, A1GS, A2GS, BETS, BETA2SER, GAMS, MSPIKE, SPEI No results found for: TIBC, FERRITIN, IRONPCTSAT No results found for: LDH  STUDIES:  No results found.   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 He has a 60.00 pack-year smoking  history. He has never used smokeless tobacco. He reports previous alcohol use. He reports previous drug use.The patient is alone today.  Allergies: No Known Allergies  Current Medications: Current Outpatient Medications  Medication Sig Dispense Refill   atorvastatin (LIPITOR) 80 MG tablet Take 80 mg by mouth at bedtime.     budesonide-formoterol (SYMBICORT) 80-4.5 MCG/ACT inhaler Inhale 2 puffs into the lungs 2 (two) times daily.     clotrimazole-betamethasone (LOTRISONE) cream Apply 1 application topically 2 (two) times daily. 30 g 0   Ferrous Fumarate (HEMOCYTE - 106 MG FE) 324 (106 Fe) MG TABS tablet Take 1 tablet by mouth.     furosemide (LASIX) 40 MG tablet Take 40 mg by mouth daily.     glimepiride (AMARYL) 4 MG tablet Take by mouth.     lisinopril (ZESTRIL) 10 MG tablet Take 10 mg by mouth daily.     magnesium oxide (MAG-OX) 400 (241.3 Mg) MG tablet Take 400 mg by mouth 2 (two) times daily.     metFORMIN (GLUCOPHAGE) 1000 MG tablet Take 1,000 mg by mouth 2 (two) times daily.     metFORMIN (GLUCOPHAGE) 500 MG tablet Take 1,000 mg by mouth 2 (two) times daily with a meal.     mirtazapine (REMERON) 15 MG tablet Take 15 mg by mouth at bedtime.     ondansetron (ZOFRAN) 4 MG tablet Take 1 tablet (4 mg total) by mouth every 4 (four) hours as needed  for nausea. (Patient not taking: Reported on 05/07/2021) 90 tablet 3   ondansetron (ZOFRAN-ODT) 4 MG disintegrating tablet Take by mouth at bedtime. (Patient not taking: Reported on 05/07/2021)     pioglitazone (ACTOS) 30 MG tablet Take 30 mg by mouth daily.     polyethylene glycol (MIRALAX / GLYCOLAX) 17 g packet Take 17 g by mouth daily. (Patient not taking: Reported on 05/07/2021)     prochlorperazine (COMPAZINE) 10 MG tablet Take 10 mg by mouth every 6 (six) hours as needed for nausea or vomiting. (Patient not taking: Reported on 05/07/2021)     sitaGLIPtin (JANUVIA) 100 MG tablet Take 100 mg by mouth daily.     spironolactone (ALDACTONE) 25 MG  tablet Take 25 mg by mouth daily.     triamcinolone (KENALOG) 0.025 % cream Apply 1 application topically 2 (two) times daily.     No current facility-administered medications for this visit.     ASSESSMENT & PLAN:   Assessment:  1. Recurrent/metastatic melanoma, which remains controlled with palliative nivolumab. Repeat CT imaging in June did not reveal any evidence of metastatic disease.   2. B12 deficiency, for which he resumed monthly B12 injections in April with improvement of his hemoglobin.   3. History of chronic kidney disease, BUN and creatinine normalized.   4. Congestive heart failure and pacemaker.   5. Mild anemia, felt to be due to iron deficiency, for which he continues oral iron supplement daily.    6. Mild hypomagnesemia, resolved. He will continue magnesium oxide 400 mg supplement twice daily.   7. Tobacco abuse, we discussed the importance of complete abstinence from tobacco again today. The patient is still not motivated to quit at this time.     Plan:   He will proceed with nivolumab and B12 on August 2nd.  He knows to continue iron and magnesium as prescribed. I will plan to see him in 5 weeks with a CBC, comprehensive metabolic panel, TSH and T4 prior to his next nivolumab and B12. The patient understands the plans discussed today and is in agreement with them.  He knows to contact our office if he develops concerns prior to his next appointment.     [name], PA-C

## 2021-06-05 ENCOUNTER — Inpatient Hospital Stay: Payer: Medicare PPO

## 2021-06-05 ENCOUNTER — Other Ambulatory Visit: Payer: Self-pay | Admitting: Oncology

## 2021-06-05 LAB — T4: T4, Total: 7.6 ug/dL (ref 4.5–12.0)

## 2021-06-07 ENCOUNTER — Encounter: Payer: Self-pay | Admitting: Oncology

## 2021-06-11 ENCOUNTER — Other Ambulatory Visit: Payer: Self-pay

## 2021-06-11 ENCOUNTER — Inpatient Hospital Stay: Payer: Medicare PPO | Attending: Oncology

## 2021-06-11 VITALS — BP 107/53 | HR 79 | Temp 97.9°F | Resp 18 | Ht 68.0 in | Wt 156.2 lb

## 2021-06-11 DIAGNOSIS — Z5112 Encounter for antineoplastic immunotherapy: Secondary | ICD-10-CM | POA: Diagnosis not present

## 2021-06-11 DIAGNOSIS — C4339 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: Secondary | ICD-10-CM

## 2021-06-11 DIAGNOSIS — C4330 Malignant melanoma of unspecified part of face: Secondary | ICD-10-CM | POA: Diagnosis not present

## 2021-06-11 DIAGNOSIS — E538 Deficiency of other specified B group vitamins: Secondary | ICD-10-CM | POA: Insufficient documentation

## 2021-06-11 MED ORDER — HEPARIN SOD (PORK) LOCK FLUSH 100 UNIT/ML IV SOLN
500.0000 [IU] | Freq: Once | INTRAVENOUS | Status: AC | PRN
  Administered 2021-06-11: 500 [IU]
  Filled 2021-06-11: qty 5

## 2021-06-11 MED ORDER — SODIUM CHLORIDE 0.9 % IV SOLN
Freq: Once | INTRAVENOUS | Status: AC
  Filled 2021-06-11: qty 250

## 2021-06-11 MED ORDER — CYANOCOBALAMIN 1000 MCG/ML IJ SOLN
INTRAMUSCULAR | Status: AC
  Filled 2021-06-11: qty 1

## 2021-06-11 MED ORDER — SODIUM CHLORIDE 0.9% FLUSH
10.0000 mL | INTRAVENOUS | Status: DC | PRN
  Administered 2021-06-11: 10 mL
  Filled 2021-06-11: qty 10

## 2021-06-11 MED ORDER — CYANOCOBALAMIN 1000 MCG/ML IJ SOLN
1000.0000 ug | Freq: Once | INTRAMUSCULAR | Status: AC
  Administered 2021-06-11: 1000 ug via INTRAMUSCULAR

## 2021-06-11 MED ORDER — SODIUM CHLORIDE 0.9 % IV SOLN
480.0000 mg | Freq: Once | INTRAVENOUS | Status: AC
  Administered 2021-06-11: 480 mg via INTRAVENOUS
  Filled 2021-06-11: qty 48

## 2021-06-11 NOTE — Patient Instructions (Addendum)
[facility]  Discharge Instructions: Thank you for choosing [facility] to provide your oncology and hematology care.  If you have a lab appointment with the [facility], please go directly to the [facility] and check in at the registration area.   Wear comfortable clothing and clothing appropriate for easy access to any Portacath or PICC line.   We strive to give you quality time with your provider. You may need to reschedule your appointment if you arrive late (15 or more minutes).  Arriving late affects you and other patients whose appointments are after yours.  Also, if you miss three or more appointments without notifying the office, you may be dismissed from the clinic at the provider's discretion.      For prescription refill requests, have your pharmacy contact our office and allow 72 hours for refills to be completed.    Today you received the following chemotherapy and/or immunotherapy agents nivolumab      To help prevent nausea and vomiting after your treatment, we encourage you to take your nausea medication as directed.  BELOW ARE SYMPTOMS THAT SHOULD BE REPORTED IMMEDIATELY: *FEVER GREATER THAN 100.4 F (38 C) OR HIGHER *CHILLS OR SWEATING *NAUSEA AND VOMITING THAT IS NOT CONTROLLED WITH YOUR NAUSEA MEDICATION *UNUSUAL SHORTNESS OF BREATH *UNUSUAL BRUISING OR BLEEDING *URINARY PROBLEMS (pain or burning when urinating, or frequent urination) *BOWEL PROBLEMS (unusual diarrhea, constipation, pain near the anus) TENDERNESS IN MOUTH AND THROAT WITH OR WITHOUT PRESENCE OF ULCERS (sore throat, sores in mouth, or a toothache) UNUSUAL RASH, SWELLING OR PAIN  UNUSUAL VAGINAL DISCHARGE OR ITCHING   Items with * indicate a potential emergency and should be followed up as soon as possible or go to the Emergency Department if any problems should occur.  Please show the CHEMOTHERAPY ALERT CARD or IMMUNOTHERAPY ALERT CARD at check-in to the  Emergency Department and triage nurse.  Should you have questions after your visit or need to cancel or reschedule your appointment, please contact [facility]  Dept: [phone]  and follow the prompts.  Office hours are 8:00 a.m. to 4:30 p.m. Monday - Friday. Please note that voicemails left after 4:00 p.m. may not be returned until the following business day.  We are closed weekends and major holidays. You have access to a nurse at all times for urgent questions. Please call the main number to the clinic Dept: [phone] and follow the prompts.  For any non-urgent questions, you may also contact your provider using MyChart. We now offer e-Visits for anyone 6 and older to request care online for non-urgent symptoms. For details visit [email or web address].   Also download the MyChart app! Go to the app store, search "MyChart", open the app, select [facility], and log in with your MyChart username and password.  Due to Covid, a mask is required upon entering the hospital/clinic. If you do not have a mask, one will be given to you upon arrival. For doctor visits, patients may have 1 support person aged 64 or older with them. For treatment visits, patients cannot have anyone with them due to current Covid guidelines and our immunocompromised population.   Vitamin B12 Injection What is this medication? Vitamin B12 (VAHY tuh min B12) prevents and treats low vitamin B12 levels in your body. It is used in people who do not get enough vitamin B12 from their diet or when their digestive tract does not absorb enough. Vitamin B12 plays an  important role in maintaining the health of your nervous system and red bloodcells. This medicine may be used for other purposes; ask your health care provider orpharmacist if you have questions. COMMON BRAND NAME(S): B-12 Compliance Kit, B-12 Injection Kit, Cyomin, LA-12,Nutri-Twelve, Physicians EZ Use B-12, Primabalt What should I tell my  care team before I take this medication? They need to know if you have any of these conditions: Kidney disease Leber's disease Megaloblastic anemia An unusual or allergic reaction to cyanocobalamin, cobalt, other medications, foods, dyes, or preservatives Pregnant or trying to get pregnant Breast-feeding How should I use this medication? This medication is injected into a muscle or deeply under the skin. It is usually given in a clinic or care team's office. However, your care team [place] you how to inject yourself. Follow all instructions. Talk to your care team about the use of this medication in children. Specialcare may be needed. Overdosage: If you think you have taken too much of this medicine contact apoison control center or emergency room at once. NOTE: This medicine is only for you. Do not share this medicine with others. What if I miss a dose? If you are given your dose at a clinic or care team's office, call to reschedule your appointment. If you give your own injections, and you miss a dose, take it as soon as you can. If it is almost time for your next dose, takeonly that dose. Do not take double or extra doses. What may interact with this medication? Colchicine Heavy alcohol intake This list may not describe all possible interactions. Give your health care provider a list of all the medicines, herbs, non-prescription drugs, or dietary supplements you use. Also tell them if you smoke, drink alcohol, or use illegaldrugs. Some items may interact with your medicine. What should I watch for while using this medication? Visit your care team regularly. You may need blood work done while you aretaking this medication. You may need to follow a special diet. Talk to your care team. Limit youralcohol intake and avoid smoking to get the best benefit. What side effects may I notice from receiving this medication? Side effects that you should report to your care team as soon as  possible: Allergic reactions-skin rash, itching, hives, swelling of the face, lips, tongue, or throat Swelling of the ankles, hands, or feet Trouble breathing Side effects that usually do not require medical attention (report to your careteam if they continue or are bothersome): Diarrhea This list may not describe all possible side effects. Call your doctor for medical advice about side effects. You may report side effects to FDA at1-800-FDA-1088. Where should I keep my medication? Keep out of the reach of children. Store at room temperature between 15 and 30 degrees C (59 and 85 degrees F).Protect from light. Throw away any unused medication after the expiration date. NOTE: This sheet is a summary. It may not cover all possible information. If you have questions about this medicine, talk to your doctor, pharmacist, orhealth care provider.  2022 Elsevier/Gold Standard (2020-12-17 11:47:06)

## 2021-06-24 DIAGNOSIS — I5040 Unspecified combined systolic (congestive) and diastolic (congestive) heart failure: Secondary | ICD-10-CM | POA: Diagnosis not present

## 2021-06-24 DIAGNOSIS — E7800 Pure hypercholesterolemia, unspecified: Secondary | ICD-10-CM | POA: Diagnosis not present

## 2021-06-24 DIAGNOSIS — Z6824 Body mass index (BMI) 24.0-24.9, adult: Secondary | ICD-10-CM | POA: Diagnosis not present

## 2021-06-24 DIAGNOSIS — I471 Supraventricular tachycardia: Secondary | ICD-10-CM | POA: Diagnosis not present

## 2021-06-24 DIAGNOSIS — I2510 Atherosclerotic heart disease of native coronary artery without angina pectoris: Secondary | ICD-10-CM | POA: Diagnosis not present

## 2021-06-24 DIAGNOSIS — C4339 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: Secondary | ICD-10-CM | POA: Diagnosis not present

## 2021-06-24 DIAGNOSIS — I10 Essential (primary) hypertension: Secondary | ICD-10-CM | POA: Diagnosis not present

## 2021-06-24 DIAGNOSIS — E118 Type 2 diabetes mellitus with unspecified complications: Secondary | ICD-10-CM | POA: Diagnosis not present

## 2021-07-08 ENCOUNTER — Inpatient Hospital Stay: Payer: Medicare PPO | Admitting: Oncology

## 2021-07-08 ENCOUNTER — Telehealth: Payer: Self-pay

## 2021-07-08 ENCOUNTER — Other Ambulatory Visit: Payer: Medicare PPO

## 2021-07-08 ENCOUNTER — Encounter: Payer: Self-pay | Admitting: Oncology

## 2021-07-08 MED FILL — Nivolumab IV Soln 100 MG/10ML: INTRAVENOUS | Qty: 48 | Status: AC

## 2021-07-08 NOTE — Telephone Encounter (Signed)
I spoke to [place] to inquire of symptoms her dad was having. She states, "He is just having a bad day. Ovid Curd took him off his blood pressure pills and he just doesn't feel good. He took him off the pills because his blood pressure has been running low". Pt does not have fever, N/V, diarrhea, SOB, or any symptoms of infection per [name].  I called Angela,LPN, that works with Dr Hinton Rao. She will rely message to Dr Hinton Rao. I called Ulice Dash, pharmacist & made her aware that pt will not be coming for Opdivo infusion tomorrow. I will cancel appt's and send scheduling message.

## 2021-07-09 ENCOUNTER — Inpatient Hospital Stay: Payer: Medicare PPO

## 2021-07-10 ENCOUNTER — Other Ambulatory Visit: Payer: Self-pay | Admitting: Pharmacist

## 2021-07-10 ENCOUNTER — Encounter: Payer: Self-pay | Admitting: Oncology

## 2021-07-16 ENCOUNTER — Inpatient Hospital Stay (INDEPENDENT_AMBULATORY_CARE_PROVIDER_SITE_OTHER): Payer: Medicare PPO | Admitting: Hematology and Oncology

## 2021-07-16 ENCOUNTER — Inpatient Hospital Stay: Payer: Medicare PPO | Attending: Oncology

## 2021-07-16 ENCOUNTER — Other Ambulatory Visit: Payer: Self-pay

## 2021-07-16 ENCOUNTER — Encounter: Payer: Self-pay | Admitting: Hematology and Oncology

## 2021-07-16 VITALS — BP 141/68 | HR 81 | Temp 98.7°F | Resp 18 | Ht 68.0 in | Wt 163.0 lb

## 2021-07-16 DIAGNOSIS — C4339 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: Secondary | ICD-10-CM | POA: Diagnosis not present

## 2021-07-16 DIAGNOSIS — Z5112 Encounter for antineoplastic immunotherapy: Secondary | ICD-10-CM | POA: Insufficient documentation

## 2021-07-16 DIAGNOSIS — D539 Nutritional anemia, unspecified: Secondary | ICD-10-CM | POA: Insufficient documentation

## 2021-07-16 LAB — COMPREHENSIVE METABOLIC PANEL
Albumin: 4 (ref 3.5–5.0)
Calcium: 9.2 (ref 8.7–10.7)

## 2021-07-16 LAB — CBC
MCV: 95 — AB (ref 80–94)
RBC: 3.49 — AB (ref 3.87–5.11)

## 2021-07-16 LAB — HEPATIC FUNCTION PANEL
ALT: 10 (ref 10–40)
AST: 22 (ref 14–40)
Alkaline Phosphatase: 65 (ref 25–125)
Bilirubin, Total: 0.6

## 2021-07-16 LAB — CBC AND DIFFERENTIAL
HCT: 33 — AB (ref 41–53)
Hemoglobin: 11.3 — AB (ref 13.5–17.5)
Neutrophils Absolute: 3.17
Platelets: 161 (ref 150–399)
WBC: 4.4

## 2021-07-16 LAB — BASIC METABOLIC PANEL
BUN: 19 (ref 4–21)
CO2: 24 — AB (ref 13–22)
Chloride: 105 (ref 99–108)
Creatinine: 1.1 (ref 0.6–1.3)
Glucose: 108
Potassium: 3.9 (ref 3.4–5.3)
Sodium: 138 (ref 137–147)

## 2021-07-16 LAB — TSH: TSH: 1.541 u[IU]/mL (ref 0.350–4.500)

## 2021-07-16 LAB — MAGNESIUM: Magnesium: 1.7

## 2021-07-16 LAB — FOLATE: Folate: 6.7 ng/mL (ref >5.9)

## 2021-07-16 NOTE — Assessment & Plan Note (Signed)
Worsening macrocytic anemia, despite monthly B12 injections. Will check a folate level today.

## 2021-07-16 NOTE — Progress Notes (Signed)
[facility]  [address] 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 CT neck and chest in June did not reveal any evidence of progressive disease. Examination is stable today. He will proceed with nivolumab on September 9th. We will plan to see him back in 4 weeks with a CBC, comprehensive metabolic panel and TSH prior to his next cycle.  Macrocytic anemia Worsening macrocytic anemia, despite monthly B12 injections. Will check a folate level today.   The patient and his daughter understand the plans discussed today and are in agreement with them.  They know to contact our office if he develops concerns prior to his next appointment.    [name], PA-C    Orders Placed This Encounter  Procedures   CBC and differential    This external order was created through the Results Console.   CBC    This external order was created through the Results Console.   CBC    This order was created through External Result Entry   Folate    Standing Status:   Future    Standing Expiration Date:   123XX123   Basic metabolic panel    This external order was created through the Results Console.   Comprehensive metabolic panel    This external order was created through the Results Console.   Hepatic function panel    This external order was created through the Results Console.   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  His original lesion was diagnosed in March 2016.  He underwent excision, but refused aggressive surgery.  The lesion was at least 4 mm thick with a Clark's level 4, and a mitotic index every 10/mm.  The margins were positive, but reexcision revealed no residual melanoma.  He had recurrence within 2 months and the lesion had been steadily growing.  He initially had refused seeing a medical oncologist, but finally came to [place] for evaluation and treatment in October 2016.  He does have extensive comorbidities including COPD, diabetes, hypertension, coronary artery disease, history of myocardial infarction, and pacemaker with AICD.  PET revealed hypermetabolism in the right submandibular node, but no evidence of distant metastasis.  He was having significant pain and requiring regular hydrocodone doses.  He also had severe disfigurement and avoided going out in public because of the facial lesion.  He has been receiving palliative nivolumab since November 2016 and has had a good response with a decrease in the right facial tumor.  He has had associated hypomagnesemia and hypokalemia, so is on oral magnesium and potassium supplements. He has also intermittently required IV magnesium and potassium supplementation.  He has had mild chronic anemia, which has been stable. He was found to have  B12 deficiency and continues B12 injections monthly.  CT head/neck in May 2017 revealed some nonspecific skin thickening in the right face in the area of his melanoma in the right submandibular lymph node had decreased in size, now measuring 6 x 12 mm, but still prominent when compared to the left submandibular node.  He had 15 cycles of nivolumab and relocated to continue to get the same treatment.  He had his 30th dose in February 2018 and then returned to the [place] area.      However, he was admitted to the hospital in early March 2018 for congestive heart failure associated with pleural and pericardial effusions.  He also had changes in the lung and it was unclear as to how much of this was fluid overload versus infection versus pneumonitis from the immunotherapy.  He had no leukocytosis, fever or purulent sputum.  The echocardiogram revealed severe diffuse hypokinesis with mild concentric hypertrophy and markedly depressed ejection fraction of 20 to 25%, associated with moderate mitral regurgitation and moderate left atrial dilatation.  He improved with diuresis.  The pleural effusions were felt to be most likely secondary to congestive heart failure.  He was also placed on corticosteroids due to the possibility of side effects of immunotherapy, but these were subsequently discontinued.  CT chest in March 2018 revealed stable cardiomegaly and stable small pericardial effusion, but no evidence of immune mediated pneumonitis or metastatic disease.  The previously seen bilateral pleural effusions had resolved.  He returned to our care and nivolumab every 2 weeks was resumed in April.  CT chest was repeated in early May after 1 month back on therapy.  This was stable, without evidence of recurrent pleural effusions, immunotherapy toxicities, or evidence of metastatic disease.  He was switched to every 28-day nivolumab in August 2018.  He has had stable disease so has continued on nivolumab.  He remains on both furosemide and spironolactone.  CT imaging in October 2018 revealed a stable 5 mm level 1B cervical lymph node, as well as a stable 8 mm exophytic lesion of the right face.  CT chest did not reveal any evidence of metastatic disease. Borderline cardiomegaly and a small amount of pericardial fluid/thickening was seen, but not reaccumulation of the pleural effusions.  CT head did not reveal any evidence of intracranial metastasis.  There were chronic ischemic changes and evidence of  old infarcts.  He has continued palliative nivolumab.   CT neck and chest in February 2019 were stable.  He was admitted to [facility] health in May 2019 twice with severe hypoglycemia.  Repeat imaging in May did not reveal any progressive disease.  His nivolumab was held in May, but resumed at the end of June.  He did not have a dose in 2023/06/13.  Unfortunately, his wife passed away in 2018/06/12 and she was his primary caregiver.  His daughter has been caring for him since.  CT neck and chest in November 2019 did not reveal any evidence of recurrent or metastatic disease.  CT imaging in January 2020 did not reveal evidence of recurrence, so he has continued nivolumab every 4 weeks.  He was given mirtazapine for depression and weight loss the dose was increased to 15 mg.  CT scans in June 2020 remained stable with a small pericardial effusion and bilateral renal cysts. CT neck, chest and abdomen in November 2020 did not reveal any evidence of metastasis.     CT head, neck and chest from May and October 2021  were negative for evidence of metastatic disease.  He was then lost to follow-up from December 2021 to April 2022, at which time he resumed nivolumab and B12 every 4 weeks.   CT head, neck and chest in June did not reveal any evidence of progressive disease.  INTERVAL HISTORY:  Kevin [name] is here today for repeat clinical assessment prior to another cycle of nivolumab. He continues to do fairly well. He has leg swelling, but states it goes down overnight. He denies fevers or chills. He denies pain. His appetite is good. His weight has increased 7 pounds over last 5 weeks . His daughter states he was taken off Lipitor and Lasix, but she still gives him 1/2 tab of Lasix daily or he has leg swelling.  He states he has not seen his cardiologist in some time, so I gave her the phone number for Dr. Talbot Grumbling office in [facility].  REVIEW OF SYSTEMS:  Review of Systems  Constitutional:  Negative for appetite change, chills,  fatigue, fever and unexpected weight change.  HENT:   Negative for lump/mass, mouth sores and sore throat.   Respiratory:  Negative for cough and shortness of breath.   Cardiovascular:  Positive for leg swelling. Negative for chest pain.  Gastrointestinal:  Negative for abdominal pain, constipation, diarrhea, nausea and vomiting.  Genitourinary:  Negative for difficulty urinating, dysuria, frequency and hematuria.   Musculoskeletal:  Negative for arthralgias, back pain and myalgias.  Skin:  Negative for itching, rash and wound.  Neurological:  Negative for dizziness, extremity weakness, headaches, light-headedness and numbness.  Hematological:  Negative for adenopathy.  Psychiatric/Behavioral:  Negative for depression and sleep disturbance. The patient is not nervous/anxious.     VITALS:  Blood pressure (!) 141/68, pulse 81, temperature 98.7 F (37.1 C), temperature source Oral, resp. rate 18, height '5\' 8"'$  (1.727 m), weight 163 lb (73.9 kg), SpO2 95 %.  Wt Readings from Last 3 Encounters:  07/16/21 163 lb (73.9 kg)  06/11/21 156 lb 4 oz (70.9 kg)  06/04/21 156 lb 6.4 oz (70.9 kg)    Body mass index is 24.78 kg/m.  Performance status (ECOG): 1 - Symptomatic but completely ambulatory  PHYSICAL EXAM:  Physical Exam Vitals and nursing note reviewed.  Constitutional:      General: He is not in acute distress.    Appearance: Normal appearance. He is normal weight.  HENT:     Head: Normocephalic and atraumatic.     Mouth/Throat:     Mouth: Mucous membranes are moist.     Pharynx: Oropharynx is clear. No oropharyngeal exudate or posterior oropharyngeal erythema.  Eyes:     General: No scleral icterus.    Extraocular Movements: Extraocular movements intact.     Conjunctiva/sclera: Conjunctivae normal.     Pupils: Pupils are equal, round, and reactive to light.  Cardiovascular:     Rate and Rhythm: Normal rate and regular rhythm.     Heart sounds: Normal heart sounds. No murmur  heard.   No friction rub. No gallop.  Pulmonary:     Effort: Pulmonary effort is normal.     Breath sounds: Normal breath sounds. No wheezing, rhonchi or rales.  Abdominal:     General: Bowel sounds are normal. There is no distension.     Palpations: Abdomen is soft. There is no hepatomegaly, splenomegaly or mass.     Tenderness: There is no abdominal tenderness.  Musculoskeletal:        General: Normal range of motion.  Cervical back: Normal range of motion and neck supple. No tenderness.     Right lower leg: No edema (1-2+).     Left lower leg: No edema (1+).  Lymphadenopathy:     Cervical: No cervical adenopathy.     Upper Body:     Right upper body: No supraclavicular or axillary adenopathy.     Left upper body: No supraclavicular or axillary adenopathy.     Lower Body: No right inguinal adenopathy. No left inguinal adenopathy.  Skin:    General: Skin is warm and dry.     Coloration: Skin is not jaundiced.     Findings: No rash.  Neurological:     Mental Status: He is alert and oriented to person, place, and time.     Cranial Nerves: No cranial nerve deficit.  Psychiatric:        Mood and Affect: Mood normal.        Behavior: Behavior normal.        Thought Content: Thought content normal.    LABS:   CBC Latest Ref Rng & Units 07/16/2021 06/04/2021 05/07/2021  WBC - 4.4 3.7 6.0  Hemoglobin 13.5 - 17.5 11.3(A) 12.4(A) 12.0(A)  Hematocrit 41 - 53 33(A) 38(A) 37(A)  Platelets 150 - 399 161 200 194   CMP Latest Ref Rng & Units 07/16/2021 06/04/2021 05/07/2021  Glucose 70 - 99 mg/dL - - -  BUN 4 - 21 19 27(A) 25(A)  Creatinine 0.6 - 1.3 1.1 1.1 1.0  Sodium 137 - 147 138 143 139  Potassium 3.4 - 5.3 3.9 4.3 4.5  Chloride 99 - 108 105 105 106  CO2 13 - 22 24(A) 27(A) 24(A)  Calcium 8.7 - 10.7 9.2 9.2 8.5(A)  Total Protein 6.5 - 8.1 g/dL - - -  Total Bilirubin 0.3 - 1.2 mg/dL - - -  Alkaline Phos 25 - 125 65 65 53  AST 14 - 40 '22 22 17  '$ ALT 10 - 40 '10 14 10     '$ No  results found for: CEA1 / No results found for: CEA1 No results found for: PSA1 No results found for: [id number] No results found for: CAN125  No results found for: TOTALPROTELP, ALBUMINELP, A1GS, A2GS, BETS, BETA2SER, GAMS, MSPIKE, SPEI No results found for: TIBC, FERRITIN, IRONPCTSAT No results found for: LDH  STUDIES:  No results found.    HISTORY:   Past Medical History:  Diagnosis Date   Cancer ([place])    CHF (congestive heart failure) ([facility])    COPD (chronic obstructive pulmonary disease) (H[facility])    Diabetes mellitus without complication ([place])    History of myocardial infarction    Hyperlipidemia    Hypertension    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)    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)    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) ([place])     Past Surgical History:  Procedure Laterality Date   APPENDECTOMY     CARDIAC CATHETERIZATION     CORONARY ANGIOPLASTY     PACEMAKER INSERTION      Family History  Problem Relation Age of Onset   Diabetes Mellitus II Father    Breast cancer Mother    Ovarian cancer Sister    Cancer Brother    Cancer Maternal Uncle    Breast cancer Niece    Cancer Brother     Social History:  reports that he has been smoking cigarettes. He has a 60.00 pack-year smoking history.  He has never used smokeless tobacco. He reports that he does not currently use alcohol. He reports that he does not currently use drugs.The patient is accompanied by his daughter today.  Allergies: No Known Allergies  Current Medications: Current Outpatient Medications  Medication Sig Dispense Refill   atorvastatin (LIPITOR) 80 MG tablet Take 80 mg by mouth at bedtime.     budesonide-formoterol (SYMBICORT) 80-4.5 MCG/ACT inhaler Inhale 2 puffs into the lungs 2 (two) times daily.     clotrimazole-betamethasone (LOTRISONE) cream Apply 1 application topically 2 (two) times daily. 30 g 0   Ferrous Fumarate (HEMOCYTE - 106 MG FE) 324  (106 Fe) MG TABS tablet Take 1 tablet by mouth.     furosemide (LASIX) 40 MG tablet Take 40 mg by mouth daily.     glimepiride (AMARYL) 4 MG tablet Take by mouth.     lisinopril (ZESTRIL) 10 MG tablet Take 10 mg by mouth daily.     magnesium oxide (MAG-OX) 400 (241.3 Mg) MG tablet Take 400 mg by mouth 2 (two) times daily.     metFORMIN (GLUCOPHAGE) 1000 MG tablet Take 1,000 mg by mouth 2 (two) times daily.     metFORMIN (GLUCOPHAGE) 500 MG tablet Take 1,000 mg by mouth 2 (two) times daily with a meal.     mirtazapine (REMERON) 15 MG tablet Take 15 mg by mouth at bedtime.     ondansetron (ZOFRAN) 4 MG tablet Take 1 tablet (4 mg total) by mouth every 4 (four) hours as needed for nausea. (Patient not taking: Reported on 05/07/2021) 90 tablet 3   ondansetron (ZOFRAN-ODT) 4 MG disintegrating tablet Take by mouth at bedtime. (Patient not taking: Reported on 05/07/2021)     pioglitazone (ACTOS) 30 MG tablet Take 30 mg by mouth daily.     polyethylene glycol (MIRALAX / GLYCOLAX) 17 g packet Take 17 g by mouth daily. (Patient not taking: Reported on 05/07/2021)     prochlorperazine (COMPAZINE) 10 MG tablet Take 10 mg by mouth every 6 (six) hours as needed for nausea or vomiting. (Patient not taking: Reported on 05/07/2021)     sitaGLIPtin (JANUVIA) 100 MG tablet Take 100 mg by mouth daily.     spironolactone (ALDACTONE) 25 MG tablet Take 25 mg by mouth daily.     triamcinolone (KENALOG) 0.025 % cream Apply 1 application topically 2 (two) times daily.     No current facility-administered medications for this visit.

## 2021-07-16 NOTE — Assessment & Plan Note (Addendum)
Patient continues to tolerate maintenance nivolumab without difficulty. CT neck and chest in June did not reveal any evidence of progressive disease. Examination is stable today. He will proceed with nivolumab on September 9th. We will plan to see him back in 4 weeks with a CBC, comprehensive metabolic panel and TSH prior to his next cycle.

## 2021-07-17 LAB — T4: T4, Total: 7.2 ug/dL (ref 4.5–12.0)

## 2021-07-18 ENCOUNTER — Ambulatory Visit: Payer: Medicare PPO | Admitting: Oncology

## 2021-07-18 ENCOUNTER — Other Ambulatory Visit: Payer: Medicare PPO

## 2021-07-18 MED FILL — Nivolumab IV Soln 100 MG/10ML: INTRAVENOUS | Qty: 48 | Status: AC

## 2021-07-19 ENCOUNTER — Inpatient Hospital Stay: Payer: Medicare PPO

## 2021-07-19 ENCOUNTER — Other Ambulatory Visit: Payer: Self-pay

## 2021-07-19 VITALS — BP 114/60 | HR 78 | Temp 98.2°F | Resp 18 | Ht 68.0 in | Wt 162.1 lb

## 2021-07-19 DIAGNOSIS — C4339 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: Secondary | ICD-10-CM

## 2021-07-19 DIAGNOSIS — D539 Nutritional anemia, unspecified: Secondary | ICD-10-CM | POA: Diagnosis not present

## 2021-07-19 DIAGNOSIS — Z5112 Encounter for antineoplastic immunotherapy: Secondary | ICD-10-CM | POA: Diagnosis not present

## 2021-07-19 MED ORDER — SODIUM CHLORIDE 0.9% FLUSH
10.0000 mL | INTRAVENOUS | Status: DC | PRN
  Administered 2021-07-19: 10 mL

## 2021-07-19 MED ORDER — CYANOCOBALAMIN 1000 MCG/ML IJ SOLN
1000.0000 ug | Freq: Once | INTRAMUSCULAR | Status: AC
  Administered 2021-07-19: 1000 ug via INTRAMUSCULAR
  Filled 2021-07-19: qty 1

## 2021-07-19 MED ORDER — SODIUM CHLORIDE 0.9 % IV SOLN: Freq: Once | INTRAVENOUS | Status: AC

## 2021-07-19 MED ORDER — SODIUM CHLORIDE 0.9 % IV SOLN
480.0000 mg | Freq: Once | INTRAVENOUS | Status: AC
  Administered 2021-07-19: 480 mg via INTRAVENOUS
  Filled 2021-07-19: qty 48

## 2021-07-19 MED ORDER — HEPARIN SOD (PORK) LOCK FLUSH 100 UNIT/ML IV SOLN
500.0000 [IU] | Freq: Once | INTRAVENOUS | Status: AC | PRN
Start: 2021-07-19 — End: 2021-07-19
  Administered 2021-07-19: 500 [IU]

## 2021-07-19 NOTE — Patient Instructions (Signed)
[facility]  Discharge Instructions: Thank you for choosing [facility] to provide your oncology and hematology care.  If you have a lab appointment with the [facility], please go directly to the [facility] and check in at the registration area.   Wear comfortable clothing and clothing appropriate for easy access to any Portacath or PICC line.   We strive to give you quality time with your provider. You may need to reschedule your appointment if you arrive late (15 or more minutes).  Arriving late affects you and other patients whose appointments are after yours.  Also, if you miss three or more appointments without notifying the office, you may be dismissed from the clinic at the provider's discretion.      For prescription refill requests, have your pharmacy contact our office and allow 72 hours for refills to be completed.    Today you received the following chemotherapy and/or immunotherapy agents opdivo    To help prevent nausea and vomiting after your treatment, we encourage you to take your nausea medication as directed.  BELOW ARE SYMPTOMS THAT SHOULD BE REPORTED IMMEDIATELY: *FEVER GREATER THAN 100.4 F (38 C) OR HIGHER *CHILLS OR SWEATING *NAUSEA AND VOMITING THAT IS NOT CONTROLLED WITH YOUR NAUSEA MEDICATION *UNUSUAL SHORTNESS OF BREATH *UNUSUAL BRUISING OR BLEEDING *URINARY PROBLEMS (pain or burning when urinating, or frequent urination) *BOWEL PROBLEMS (unusual diarrhea, constipation, pain near the anus) TENDERNESS IN MOUTH AND THROAT WITH OR WITHOUT PRESENCE OF ULCERS (sore throat, sores in mouth, or a toothache) UNUSUAL RASH, SWELLING OR PAIN  UNUSUAL VAGINAL DISCHARGE OR ITCHING   Items with * indicate a potential emergency and should be followed up as soon as possible or go to the Emergency Department if any problems should occur.  Please show the CHEMOTHERAPY ALERT CARD or IMMUNOTHERAPY ALERT CARD at check-in to the  Emergency Department and triage nurse.  Should you have questions after your visit or need to cancel or reschedule your appointment, please contact [facility]  Dept: [phone]  and follow the prompts.  Office hours are 8:00 a.m. to 4:30 p.m. Monday - Friday. Please note that voicemails left after 4:00 p.m. may not be returned until the following business day.  We are closed weekends and major holidays. You have access to a nurse at all times for urgent questions. Please call the main number to the clinic Dept: [phone] and follow the prompts.  For any non-urgent questions, you may also contact your provider using MyChart. We now offer e-Visits for anyone 44 and older to request care online for non-urgent symptoms. For details visit [email or web address].   Also download the MyChart app! Go to the app store, search "MyChart", open the app, select [facility], and log in with your MyChart username and password.  Due to Covid, a mask is required upon entering the hospital/clinic. If you do not have a mask, one will be given to you upon arrival. For doctor visits, patients may have 1 support person aged 84 or older with them. For treatment visits, patients cannot have anyone with them due to current Covid guidelines and our immunocompromised population.   Vitamin B12 Deficiency Vitamin B12 deficiency means that your body does not have enough vitamin B12. The body needs this vitamin: To make red blood cells. To make genes (DNA). To help the nerves work. If you do not have enough vitamin B12 in your body, you can have health problems. What are  the causes? Not eating enough foods that contain vitamin B12. Not being able to absorb vitamin B12 from the food that you eat. Certain digestive system diseases. A condition in which the body does not make enough of a certain protein, which results in too few red blood cells (pernicious anemia). Having a surgery in which part  of the stomach or small intestine is removed. Taking medicines that make it hard for the body to absorb vitamin B12. These medicines include: Heartburn medicines. Some antibiotic medicines. Other medicines that are used to treat certain conditions. What increases the risk? Being older than age 42. Eating a vegetarian or vegan diet, especially while you are pregnant. Eating a poor diet while you are pregnant. Taking certain medicines. Having alcoholism. What are the signs or symptoms? In some cases, there are no symptoms. If the condition leads to too few blood cells or nerve damage, symptoms can occur, such as: Feeling weak. Feeling tired (fatigued). Not being hungry. Weight loss. A loss of feeling (numbness) or tingling in your hands and feet. Redness and burning of the tongue. Being mixed up (confused) or having memory problems. Sadness (depression). Problems with your senses. This can include color blindness, ringing in the ears, or loss of taste. Watery poop (diarrhea) or trouble pooping (constipation). Trouble walking. If anemia is very bad, symptoms can include: Being short of breath. Being dizzy. Having a very fast heartbeat. How is this treated? Changing the way you eat and drink, such as: Eating more foods that contain vitamin B12. Drinking little or no alcohol. Getting vitamin B12 shots. Taking vitamin B12 supplements. Your doctor will tell you the dose that is best for you. Follow these instructions at home: Eating and drinking  Eat lots of healthy foods that contain vitamin B12. These include: Meats and poultry, such as beef, pork, chicken, [place], and organ meats, such as liver. Seafood, such as clams, rainbow trout, salmon, tuna, and haddock. Eggs. Cereal and dairy products that have vitamin B12 added to them. Check the label. The items listed above may not be a complete list of what you can eat and drink. Contact a dietitian for more options. General  instructions Get any shots as told by your doctor. Take supplements only as told by your doctor. Do not drink alcohol if your doctor tells you not to. In some cases, you may only be asked to limit alcohol use. Keep all follow-up visits as told by your doctor. This is important. Contact a doctor if: Your symptoms come back. Get help right away if: You have trouble breathing. You have a very fast heartbeat. You have chest pain. You get dizzy. You pass out. Summary Vitamin B12 deficiency means that your body is not getting enough vitamin B12. In some cases, there are no symptoms of this condition. Treatment may include making a change in the way you eat and drink, getting vitamin B12 shots, or taking supplements. Eat lots of healthy foods that contain vitamin B12. This information is not intended to replace advice given to you by your health care provider. Make sure you discuss any questions you have with your health care provider. Document Revised: 07/06/2018 Document Reviewed: 07/06/2018 Elsevier Patient Education  [place] injection What is this medication? NIVOLUMAB (nye VOL ue mab) is a monoclonal antibody. It treats certain types of cancer. Some of the cancers treated are colon cancer, head and neck cancer, Hodgkin lymphoma, lung cancer, and melanoma. This medicine may be used for other purposes; ask  your health care provider or pharmacist if you have questions. COMMON BRAND NAME(S): Opdivo What should I tell my care team before I take this medication? They need to know if you have any of these conditions: autoimmune diseases like Crohn's disease, ulcerative colitis, or lupus have had or planning to have an allogeneic stem cell transplant (uses someone else's stem cells) history of chest radiation history of organ transplant nervous system problems like myasthenia gravis or Guillain-Barre syndrome an unusual or allergic reaction to nivolumab, other medicines,  foods, dyes, or preservatives pregnant or trying to get pregnant breast-feeding How should I use this medication? This medicine is for infusion into a vein. It is given by a health care professional in a hospital or clinic setting. A special MedGuide will be given to you before each treatment. Be sure to read this information carefully each time. Talk to your pediatrician regarding the use of this medicine in children. While this drug may be prescribed for children as young as 12 years for selected conditions, precautions do apply. Overdosage: If you think you have taken too much of this medicine contact a poison control center or emergency room at once. NOTE: This medicine is only for you. Do not share this medicine with others. What if I miss a dose? It is important not to miss your dose. Call your doctor or health care professional if you are unable to keep an appointment. What may interact with this medication? Interactions have not been studied. This list may not describe all possible interactions. Give your health care provider a list of all the medicines, herbs, non-prescription drugs, or dietary supplements you use. Also tell them if you smoke, drink alcohol, or use illegal drugs. Some items may interact with your medicine. What should I watch for while using this medication? This drug may make you feel generally unwell. Continue your course of treatment even though you feel ill unless your doctor tells you to stop. You may need blood work done while you are taking this medicine. Do not become pregnant while taking this medicine or for 5 months after stopping it. Women should inform their doctor if they wish to become pregnant or think they might be pregnant. There is a potential for serious side effects to an unborn child. Talk to your health care professional or pharmacist for more information. Do not breast-feed an infant while taking this medicine or for 5 months after stopping it. What  side effects may I notice from receiving this medication? Side effects that you should report to your doctor or health care professional as soon as possible: allergic reactions like skin rash, itching or hives, swelling of the face, lips, or tongue breathing problems blood in the urine bloody or watery diarrhea or black, tarry stools changes in emotions or moods changes in vision chest pain cough dizziness feeling faint or lightheaded, falls fever, chills headache with fever, neck stiffness, confusion, loss of memory, sensitivity to light, hallucination, loss of contact with reality, or seizures joint pain mouth sores redness, blistering, peeling or loosening of the skin, including inside the mouth severe muscle pain or weakness signs and symptoms of high blood sugar such as dizziness; dry mouth; dry skin; fruity breath; nausea; stomach pain; increased hunger or thirst; increased urination signs and symptoms of kidney injury like trouble passing urine or change in the amount of urine signs and symptoms of liver injury like dark yellow or brown urine; general ill feeling or flu-like symptoms; light-colored stools; loss of appetite;  nausea; right upper belly pain; unusually weak or tired; yellowing of the eyes or skin swelling of the ankles, feet, hands trouble passing urine or change in the amount of urine unusually weak or tired weight gain or loss Side effects that usually do not require medical attention (report to your doctor or health care professional if they continue or are bothersome): bone pain constipation decreased appetite diarrhea muscle pain nausea, vomiting tiredness This list may not describe all possible side effects. Call your doctor for medical advice about side effects. You may report side effects to FDA at 1-800-FDA-1088. Where should I keep my medication? This drug is given in a hospital or clinic and will not be stored at home. NOTE: This sheet is a summary.  It may not cover all possible information. If you have questions about this medicine, talk to your doctor, pharmacist, or health care provider.  2022 Elsevier/Gold Standard (2020-02-29 10:08:25)

## 2021-07-22 DIAGNOSIS — I5043 Acute on chronic combined systolic (congestive) and diastolic (congestive) heart failure: Secondary | ICD-10-CM | POA: Diagnosis not present

## 2021-07-22 DIAGNOSIS — J449 Chronic obstructive pulmonary disease, unspecified: Secondary | ICD-10-CM | POA: Diagnosis not present

## 2021-07-22 DIAGNOSIS — R6889 Other general symptoms and signs: Secondary | ICD-10-CM | POA: Diagnosis not present

## 2021-08-08 NOTE — Progress Notes (Signed)
[facility]  [address] [place],  [place]  [address] [phone]  Clinic Day:  08/15/2021  Referring physician: [name], PA-C  This document serves as a record of services personally performed by Kevin Poisson, MD. It was created on their behalf by [name],[name], a trained medical scribe. The creation of this record is based on the scribe's personal observations and the provider's statements to them.  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 CT neck and chest in June did not reveal any evidence of progressive disease. We will repeat imaging again in December.  Macrocytic anemia Macrocytic anemia, despite monthly B12 injections. This is improved today.   Plan: He will proceed with nivolumab tomorrow. We will plan to see him back in 4 weeks with a CBC, comprehensive metabolic panel, magnesium and TSH prior to his next cycle. We will repeat CT imaging towards the end of December. The patient understands the plans discussed today and is in agreement with them.  They know to contact our office if he develops concerns prior to his next appointment.  I provided 15 minutes of face-to-face time during this this encounter and > 50% was spent counseling as documented under my assessment and plan.    No orders of the defined types were placed in this encounter.     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  His original lesion was diagnosed  in March 2016.  He underwent excision, but refused aggressive surgery.  The lesion was at least 4 mm thick with a Clark's level 4, and a mitotic index 10/mm.  The margins were positive, but wide reexcision revealed no residual melanoma.  He had recurrence within 2 months and the lesion had been steadily growing.  He initially had refused seeing a medical oncologist, but finally came to [place] for evaluation and treatment in October 2016.  He does have extensive comorbidities including COPD, diabetes, hypertension, coronary artery disease, history of myocardial infarction, and pacemaker with AICD.  PET revealed hypermetabolism in the right submandibular node, but no evidence of distant metastasis.  He was having significant pain and requiring regular hydrocodone doses.  He also had severe disfigurement and avoided going out in public because of the facial lesion.  He has been receiving palliative nivolumab since November 2016 and has had a good response with a decrease in the right facial tumor.  He has had associated hypomagnesemia and hypokalemia, so is on oral magnesium and potassium supplements. He has also intermittently required IV magnesium and potassium supplementation.  He has had mild chronic anemia, which has been stable. He was found to have B12 deficiency and continues B12 injections monthly.  CT head/neck in May 2017 revealed some nonspecific skin thickening in the right face in the area of his melanoma in the right submandibular lymph node had decreased in size, now measuring 6 x 12 mm, but still prominent when compared to the left submandibular node.  He  had 15 cycles of nivolumab and relocated to continue to get the same treatment.  He had his 30th dose in February 2018 and then returned to the [place] area.     However, he was admitted to the hospital in early March 2018 for congestive heart failure associated with pleural and pericardial effusions.  He also had changes in the lung and it was  unclear as to how much of this was fluid overload versus infection versus pneumonitis from the immunotherapy.  He had no leukocytosis, fever or purulent sputum.  The echocardiogram revealed severe diffuse hypokinesis with mild concentric hypertrophy and markedly depressed ejection fraction of 20 to 25%, associated with moderate mitral regurgitation and moderate left atrial dilatation.  He improved with diuresis.  The pleural effusions were felt to be most likely secondary to congestive heart failure.  He was also placed on corticosteroids due to the possibility of side effects of immunotherapy, but these were subsequently discontinued.  CT chest in March 2018 revealed stable cardiomegaly and stable small pericardial effusion, but no evidence of immune mediated pneumonitis or metastatic disease.  The previously seen bilateral pleural effusions had resolved.  He returned to our care and nivolumab every 2 weeks was resumed in April.  CT chest was repeated in early May after 1 month back on therapy.  This was stable, without evidence of recurrent pleural effusions, immunotherapy toxicities, or evidence of metastatic disease.  He was switched to every 28-day nivolumab in August 2018.  He has had stable disease so has continued on nivolumab.  He remains on both furosemide and spironolactone.  CT imaging in October 2018 revealed a stable 5 mm level 1B cervical lymph node, as well as a stable 8 mm exophytic lesion of the right face.  CT chest did not reveal any evidence of metastatic disease. Borderline cardiomegaly and a small amount of pericardial fluid/thickening was seen, but not reaccumulation of the pleural effusions.  CT head did not reveal any evidence of intracranial metastasis.  There were chronic ischemic changes and evidence of old infarcts.  He has continued palliative nivolumab.   CT neck and chest in February 2019 were stable.  He was admitted to [facility] in May 2019 twice with severe hypoglycemia.   Repeat imaging in May did not reveal any progressive disease.  His nivolumab was held in May, but resumed at the end of June.  He did not have a dose in 06-25-23.  Unfortunately, his wife passed away in Jun 24, 2018 and she was his primary caregiver.  His daughter has been caring for him since.  CT neck and chest in November 2019 did not reveal any evidence of recurrent or metastatic disease.  CT imaging in January 2020 did not reveal evidence of recurrence, so he has continued nivolumab every 4 weeks.  He was given mirtazapine for depression and weight loss the dose was increased to 15 mg.  CT scans in June 2020 remained stable with a small pericardial effusion and bilateral renal cysts. CT neck, chest and abdomen in November 2020 did not reveal any evidence of metastasis.     CT head, neck and chest from May and October 2021 were negative for evidence of metastatic disease.  He was then lost to follow-up from December 2021 to April 2022, at which time he resumed nivolumab and B12 every 4 weeks.   CT head, neck and chest in June of 2022 did not reveal any evidence of progressive disease.  INTERVAL HISTORY:  Kevin [name] is  here for routine follow up prior to another cycle of nivolumab. He states that he has been well and denies complaints. His breathing has improved since resuming his Lasix. He continues to follow with cardiology. He continues to smoke tobacco. Hemoglobin has improved from 11.3 to 12.0, and white count and platelets are normal. Chemistries are unremarkable except for a BUN of 23, and a creatinine of 1.4, previously 1.1. I advised that he push fluids. His  appetite is good, and he has lost 4 pounds since his last visit (he had gained 5 pounds last month, but got back on his diuretics).  He denies fever, chills or other signs of infection.  He denies nausea, vomiting, bowel issues, or abdominal pain.  He denies sore throat, cough, dyspnea, or chest pain.   REVIEW OF SYSTEMS:  Review of Systems   Constitutional: Negative.  Negative for appetite change, chills, fatigue, fever and unexpected weight change.  HENT:  Negative.    Eyes: Negative.   Respiratory: Negative.  Negative for chest tightness, cough, hemoptysis, shortness of breath and wheezing.   Cardiovascular: Negative.  Negative for chest pain, leg swelling and palpitations.  Gastrointestinal: Negative.  Negative for abdominal distention, abdominal pain, blood in stool, constipation, diarrhea, nausea and vomiting.  Endocrine: Negative.   Genitourinary: Negative.  Negative for difficulty urinating, dysuria, frequency and hematuria.   Musculoskeletal: Negative.  Negative for arthralgias, back pain, flank pain, gait problem and myalgias.  Skin: Negative.   Neurological: Negative.  Negative for dizziness, extremity weakness, gait problem, headaches, light-headedness, numbness, seizures and speech difficulty.  Hematological: Negative.   Psychiatric/Behavioral: Negative.  Negative for depression and sleep disturbance. The patient is not nervous/anxious.   All other systems reviewed and are negative.   VITALS:  Blood pressure 131/74, pulse 89, temperature 98.5 F (36.9 C), temperature source Oral, resp. rate 18, height 5\' 8"  (1.727 m), weight 159 lb (72.1 kg), SpO2 93 %.  Wt Readings from Last 3 Encounters:  08/15/21 159 lb (72.1 kg)  07/19/21 162 lb 1.9 oz (73.5 kg)  07/16/21 163 lb (73.9 kg)    Body mass index is 24.18 kg/m.  Performance status (ECOG): 0 - Asymptomatic  PHYSICAL EXAM:  Physical Exam Constitutional:      General: He is not in acute distress.    Appearance: Normal appearance. He is normal weight.  HENT:     Head: Normocephalic and atraumatic.     Comments: [place] area of the right cheek. It has been there all along but seems mildly larger, which is probably not his melanoma. Eyes:     General: No scleral icterus.    Extraocular Movements: Extraocular movements intact.     Conjunctiva/sclera:  Conjunctivae normal.     Pupils: Pupils are equal, round, and reactive to light.  Cardiovascular:     Rate and Rhythm: Normal rate and regular rhythm.     Pulses: Normal pulses.     Heart sounds: Normal heart sounds. No murmur heard.   No friction rub. No gallop.  Pulmonary:     Effort: Pulmonary effort is normal. No respiratory distress.     Breath sounds: Normal breath sounds.  Chest:     Comments: Pacemaker of the left upper chest, and port of the right upper chest.  Abdominal:     General: Bowel sounds are normal. There is no distension.     Palpations: Abdomen is soft. There is no hepatomegaly, splenomegaly or mass.     Tenderness: There is no abdominal tenderness.  Musculoskeletal:        General: Normal range of motion.     Cervical back: Normal range of motion and neck supple.     Right lower leg: No edema.     Left lower leg: No edema.  Lymphadenopathy:     Cervical: No cervical adenopathy.  Skin:    General: Skin is warm and dry.     Comments: Vitiligo of the arms and abdomen  Neurological:     General: No focal deficit present.     Mental Status: He is alert and oriented to person, place, and time. Mental status is at baseline.  Psychiatric:        Mood and Affect: Mood normal.        Behavior: Behavior normal.        Thought Content: Thought content normal.        Judgment: Judgment normal.    LABS:   CBC Latest Ref Rng & Units 08/15/2021 07/16/2021 06/04/2021  WBC - 5.0 4.4 3.7  Hemoglobin 13.5 - 17.5 12.0(A) 11.3(A) 12.4(A)  Hematocrit 41 - 53 37(A) 33(A) 38(A)  Platelets 150 - 399 160 161 200   CMP Latest Ref Rng & Units 08/15/2021 07/16/2021 06/04/2021  Glucose 70 - 99 mg/dL - - -  BUN 4 - 21 23(A) 19 27(A)  Creatinine 0.6 - 1.3 1.4(A) 1.1 1.1  Sodium 137 - 147 141 138 143  Potassium 3.4 - 5.3 4.0 3.9 4.3  Chloride 99 - 108 105 105 105  CO2 13 - 22 28(A) 24(A) 27(A)  Calcium 8.7 - 10.7 9.0 9.2 9.2  Total Protein 6.5 - 8.1 g/dL - - -  Total Bilirubin 0.3  - 1.2 mg/dL - - -  Alkaline Phos 25 - 125 66 65 65  AST 14 - 40 9(A) 22 22  ALT 10 - 40 21 10 14     STUDIES:  No results found.    HISTORY:   Allergies: No Known Allergies  Current Medications: Current Outpatient Medications  Medication Sig Dispense Refill   atorvastatin (LIPITOR) 80 MG tablet Take 80 mg by mouth at bedtime.     budesonide-formoterol (SYMBICORT) 80-4.5 MCG/ACT inhaler Inhale 2 puffs into the lungs 2 (two) times daily.     clotrimazole-betamethasone (LOTRISONE) cream Apply 1 application topically 2 (two) times daily. 30 g 0   Ferrous Fumarate (HEMOCYTE - 106 MG FE) 324 (106 Fe) MG TABS tablet Take 1 tablet by mouth.     furosemide (LASIX) 40 MG tablet Take 40 mg by mouth daily.     glimepiride (AMARYL) 4 MG tablet Take by mouth.     lisinopril (ZESTRIL) 10 MG tablet Take 10 mg by mouth daily.     magnesium oxide (MAG-OX) 400 (241.3 Mg) MG tablet Take 400 mg by mouth 2 (two) times daily.     metFORMIN (GLUCOPHAGE) 1000 MG tablet Take 1,000 mg by mouth 2 (two) times daily.     mirtazapine (REMERON) 15 MG tablet Take 15 mg by mouth at bedtime.     ondansetron (ZOFRAN) 4 MG tablet Take 1 tablet (4 mg total) by mouth every 4 (four) hours as needed for nausea. (Patient not taking: Reported on 05/07/2021) 90 tablet 3   ondansetron (ZOFRAN-ODT) 4 MG disintegrating tablet Take by mouth at bedtime. (Patient not taking: Reported on 05/07/2021)     pioglitazone (ACTOS) 30 MG tablet Take 30 mg by mouth daily.     polyethylene glycol (MIRALAX / GLYCOLAX) 17 g packet Take 17  g by mouth daily. (Patient not taking: Reported on 05/07/2021)     prochlorperazine (COMPAZINE) 10 MG tablet Take 10 mg by mouth every 6 (six) hours as needed for nausea or vomiting. (Patient not taking: Reported on 05/07/2021)     sitaGLIPtin (JANUVIA) 100 MG tablet Take 100 mg by mouth daily.     spironolactone (ALDACTONE) 25 MG tablet Take 25 mg by mouth daily.     triamcinolone (KENALOG) 0.025 % cream Apply 1  application topically 2 (two) times daily.     No current facility-administered medications for this visit.    I, Rita Ohara, am acting as scribe for [name], MD  I have reviewed this report as typed by the medical scribe, and it is complete and accurate.

## 2021-08-14 ENCOUNTER — Other Ambulatory Visit: Payer: Self-pay | Admitting: Oncology

## 2021-08-14 DIAGNOSIS — C4339 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: Secondary | ICD-10-CM

## 2021-08-14 DIAGNOSIS — D519 Vitamin B12 deficiency anemia, unspecified: Secondary | ICD-10-CM

## 2021-08-15 ENCOUNTER — Ambulatory Visit: Payer: Medicare PPO | Admitting: Oncology

## 2021-08-15 ENCOUNTER — Inpatient Hospital Stay: Payer: Medicare PPO

## 2021-08-15 ENCOUNTER — Other Ambulatory Visit: Payer: Self-pay

## 2021-08-15 ENCOUNTER — Other Ambulatory Visit: Payer: Self-pay | Admitting: Pharmacist

## 2021-08-15 ENCOUNTER — Inpatient Hospital Stay: Payer: Medicare PPO | Attending: Oncology | Admitting: Oncology

## 2021-08-15 ENCOUNTER — Other Ambulatory Visit: Payer: Self-pay | Admitting: Oncology

## 2021-08-15 ENCOUNTER — Other Ambulatory Visit: Payer: Self-pay | Admitting: Hematology and Oncology

## 2021-08-15 ENCOUNTER — Encounter: Payer: Self-pay | Admitting: Oncology

## 2021-08-15 DIAGNOSIS — Z23 Encounter for immunization: Secondary | ICD-10-CM | POA: Insufficient documentation

## 2021-08-15 DIAGNOSIS — Z5112 Encounter for antineoplastic immunotherapy: Secondary | ICD-10-CM | POA: Diagnosis not present

## 2021-08-15 DIAGNOSIS — C4339 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: Secondary | ICD-10-CM | POA: Diagnosis not present

## 2021-08-15 DIAGNOSIS — D539 Nutritional anemia, unspecified: Secondary | ICD-10-CM | POA: Insufficient documentation

## 2021-08-15 LAB — HEPATIC FUNCTION PANEL
ALT: 21 (ref 10–40)
AST: 9 — AB (ref 14–40)
Alkaline Phosphatase: 66 (ref 25–125)
Bilirubin, Total: 0.4

## 2021-08-15 LAB — CBC: RBC: 3.79 — AB (ref 3.87–5.11)

## 2021-08-15 LAB — CBC AND DIFFERENTIAL
HCT: 37 — AB (ref 41–53)
Hemoglobin: 12 — AB (ref 13.5–17.5)
Neutrophils Absolute: 3.6
Platelets: 160 (ref 150–399)
WBC: 5

## 2021-08-15 LAB — BASIC METABOLIC PANEL
BUN: 23 — AB (ref 4–21)
CO2: 28 — AB (ref 13–22)
Chloride: 105 (ref 99–108)
Creatinine: 1.4 — AB (ref 0.6–1.3)
Glucose: 100
Potassium: 4 (ref 3.4–5.3)
Sodium: 141 (ref 137–147)

## 2021-08-15 LAB — COMPREHENSIVE METABOLIC PANEL
Albumin: 4.3 (ref 3.5–5.0)
Calcium: 9 (ref 8.7–10.7)

## 2021-08-15 LAB — MAGNESIUM
Magnesium: 1.6
Magnesium: 1.6 mg/dL — ABNORMAL LOW (ref 1.7–2.4)

## 2021-08-15 LAB — TSH: TSH: 2.498 u[IU]/mL (ref 0.350–4.500)

## 2021-08-15 MED FILL — Nivolumab IV Soln 100 MG/10ML: INTRAVENOUS | Qty: 48 | Status: AC

## 2021-08-16 ENCOUNTER — Other Ambulatory Visit: Payer: Self-pay

## 2021-08-16 ENCOUNTER — Inpatient Hospital Stay: Payer: Medicare PPO

## 2021-08-16 VITALS — BP 116/60 | HR 73 | Temp 97.9°F | Resp 18 | Ht 68.0 in | Wt 159.2 lb

## 2021-08-16 DIAGNOSIS — C4339 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: Secondary | ICD-10-CM

## 2021-08-16 DIAGNOSIS — Z5112 Encounter for antineoplastic immunotherapy: Secondary | ICD-10-CM | POA: Diagnosis not present

## 2021-08-16 DIAGNOSIS — D539 Nutritional anemia, unspecified: Secondary | ICD-10-CM | POA: Diagnosis not present

## 2021-08-16 DIAGNOSIS — Z23 Encounter for immunization: Secondary | ICD-10-CM | POA: Diagnosis not present

## 2021-08-16 MED ORDER — SODIUM CHLORIDE 0.9 % IV SOLN
480.0000 mg | Freq: Once | INTRAVENOUS | Status: AC
  Administered 2021-08-16: 480 mg via INTRAVENOUS
  Filled 2021-08-16: qty 48

## 2021-08-16 MED ORDER — SODIUM CHLORIDE 0.9% FLUSH
10.0000 mL | INTRAVENOUS | Status: DC | PRN
  Administered 2021-08-16: 10 mL

## 2021-08-16 MED ORDER — CYANOCOBALAMIN 1000 MCG/ML IJ SOLN
1000.0000 ug | Freq: Once | INTRAMUSCULAR | Status: AC
  Administered 2021-08-16: 1000 ug via INTRAMUSCULAR
  Filled 2021-08-16: qty 1

## 2021-08-16 MED ORDER — SODIUM CHLORIDE 0.9 % IV SOLN: Freq: Once | INTRAVENOUS | Status: AC

## 2021-08-16 MED ORDER — INFLUENZA VAC SPLIT QUAD 0.5 ML IM SUSY
0.5000 mL | PREFILLED_SYRINGE | Freq: Once | INTRAMUSCULAR | Status: AC
  Administered 2021-08-16: 0.5 mL via INTRAMUSCULAR
  Filled 2021-08-16: qty 0.5

## 2021-08-16 MED ORDER — HEPARIN SOD (PORK) LOCK FLUSH 100 UNIT/ML IV SOLN
500.0000 [IU] | Freq: Once | INTRAVENOUS | Status: AC | PRN
  Administered 2021-08-16: 500 [IU]

## 2021-08-16 NOTE — Progress Notes (Signed)
1406:PT STABLE AT TIME OF DISCHARGE

## 2021-08-16 NOTE — Patient Instructions (Signed)
[facility] [place]  Discharge Instructions: Thank you for choosing [facility] to provide your oncology and hematology care.  If you have a lab appointment with the Cancer Center, please go directly to the Cancer Center and check in at the registration area.   Wear comfortable clothing and clothing appropriate for easy access to any Portacath or PICC line.   We strive to give you quality time with your provider. You may need to reschedule your appointment if you arrive late (15 or more minutes).  Arriving late affects you and other patients whose appointments are after yours.  Also, if you miss three or more appointments without notifying the office, you may be dismissed from the clinic at the provider's discretion.      For prescription refill requests, have your pharmacy contact our office and allow 72 hours for refills to be completed.    Today you received the following chemotherapy and/or immunotherapy agents Nivolumab     To help prevent nausea and vomiting after your treatment, we encourage you to take your nausea medication as directed.  BELOW ARE SYMPTOMS THAT SHOULD BE REPORTED IMMEDIATELY: *FEVER GREATER THAN 100.4 F (38 C) OR HIGHER *CHILLS OR SWEATING *NAUSEA AND VOMITING THAT IS NOT CONTROLLED WITH YOUR NAUSEA MEDICATION *UNUSUAL SHORTNESS OF BREATH *UNUSUAL BRUISING OR BLEEDING *URINARY PROBLEMS (pain or burning when urinating, or frequent urination) *BOWEL PROBLEMS (unusual diarrhea, constipation, pain near the anus) TENDERNESS IN MOUTH AND THROAT WITH OR WITHOUT PRESENCE OF ULCERS (sore throat, sores in mouth, or a toothache) UNUSUAL RASH, SWELLING OR PAIN  UNUSUAL VAGINAL DISCHARGE OR ITCHING   Items with * indicate a potential emergency and should be followed up as soon as possible or go to the Emergency Department if any problems should occur.  Please show the CHEMOTHERAPY ALERT CARD or IMMUNOTHERAPY ALERT CARD at check-in to the  Emergency Department and triage nurse.  Should you have questions after your visit or need to cancel or reschedule your appointment, please contact [facility] [place]  Dept: [phone]  and follow the prompts.  Office hours are 8:00 a.m. to 4:30 p.m. Monday - Friday. Please note that voicemails left after 4:00 p.m. may not be returned until the following business day.  We are closed weekends and major holidays. You have access to a nurse at all times for urgent questions. Please call the main number to the clinic Dept: [phone] and follow the prompts.  For any non-urgent questions, you may also contact your provider using MyChart. We now offer e-Visits for anyone 18 and older to request care online for non-urgent symptoms. For details visit [email or web address].   Also download the MyChart app! Go to the app store, search "MyChart", open the app, select [facility], and log in with your MyChart username and password.  Due to Covid, a mask is required upon entering the hospital/clinic. If you do not have a mask, one will be given to you upon arrival. For doctor visits, patients may have 1 support person aged 18 or older with them. For treatment visits, patients cannot have anyone with them due to current Covid guidelines and our immunocompromised population.    

## 2021-08-19 ENCOUNTER — Encounter: Payer: Self-pay | Admitting: Oncology

## 2021-09-02 ENCOUNTER — Encounter: Payer: Self-pay | Admitting: Oncology

## 2021-09-12 ENCOUNTER — Ambulatory Visit: Payer: Medicare PPO | Admitting: Hematology and Oncology

## 2021-09-12 ENCOUNTER — Other Ambulatory Visit: Payer: Medicare PPO

## 2021-09-13 ENCOUNTER — Inpatient Hospital Stay: Payer: Medicare PPO

## 2021-09-17 ENCOUNTER — Encounter: Payer: Self-pay | Admitting: Oncology

## 2021-09-17 ENCOUNTER — Ambulatory Visit: Payer: Medicare PPO | Admitting: Hematology and Oncology

## 2021-09-17 ENCOUNTER — Other Ambulatory Visit: Payer: Medicare PPO

## 2021-09-18 ENCOUNTER — Inpatient Hospital Stay: Payer: Medicare PPO

## 2021-09-20 ENCOUNTER — Encounter: Payer: Self-pay | Admitting: Hematology and Oncology

## 2021-09-20 ENCOUNTER — Inpatient Hospital Stay (INDEPENDENT_AMBULATORY_CARE_PROVIDER_SITE_OTHER): Payer: Medicare PPO | Admitting: Hematology and Oncology

## 2021-09-20 ENCOUNTER — Inpatient Hospital Stay: Payer: Medicare PPO | Attending: Oncology

## 2021-09-20 VITALS — BP 127/65 | HR 85 | Temp 98.4°F | Resp 16 | Ht 68.0 in | Wt 158.0 lb

## 2021-09-20 DIAGNOSIS — Z5112 Encounter for antineoplastic immunotherapy: Secondary | ICD-10-CM | POA: Insufficient documentation

## 2021-09-20 DIAGNOSIS — C4339 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: Secondary | ICD-10-CM | POA: Diagnosis not present

## 2021-09-20 DIAGNOSIS — I10 Essential (primary) hypertension: Secondary | ICD-10-CM | POA: Diagnosis not present

## 2021-09-20 DIAGNOSIS — D649 Anemia, unspecified: Secondary | ICD-10-CM | POA: Diagnosis not present

## 2021-09-20 DIAGNOSIS — D539 Nutritional anemia, unspecified: Secondary | ICD-10-CM | POA: Insufficient documentation

## 2021-09-20 LAB — BASIC METABOLIC PANEL
BUN: 19 (ref 4–21)
CO2: 26 — AB (ref 13–22)
Chloride: 101 (ref 99–108)
Creatinine: 1.1 (ref 0.6–1.3)
Glucose: 157
Potassium: 4.2 (ref 3.4–5.3)
Sodium: 136 — AB (ref 137–147)

## 2021-09-20 LAB — COMPREHENSIVE METABOLIC PANEL
Albumin: 4.3 (ref 3.5–5.0)
Calcium: 9 (ref 8.7–10.7)

## 2021-09-20 LAB — HEPATIC FUNCTION PANEL
ALT: 16 (ref 10–40)
AST: 24 (ref 14–40)
Alkaline Phosphatase: 51 (ref 25–125)
Bilirubin, Total: 0.5

## 2021-09-20 LAB — MAGNESIUM: Magnesium: 1.6

## 2021-09-20 LAB — CBC AND DIFFERENTIAL
HCT: 38 — AB (ref 41–53)
Hemoglobin: 12.7 — AB (ref 13.5–17.5)
Neutrophils Absolute: 3.22
Platelets: 196 (ref 150–399)
WBC: 4.6

## 2021-09-20 LAB — CBC: RBC: 4.03 (ref 3.87–5.11)

## 2021-09-20 NOTE — Assessment & Plan Note (Signed)
He continues to tolerate nivolumab well with minimal side effects, mainly some nausea in the mornings. His appetite is good. He denies fever, chills, shortness of breath or cough. CBC and CMP are unremarkable today. We will obtain CT imaging prior to his next appointment in 4 weeks. He will return then for review of imaging and re-evaluation.

## 2021-09-20 NOTE — Progress Notes (Signed)
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 His appetite is good. He denies fever, chills, shortness of breath or cough. CBC and CMP are unremarkable today. We will obtain CT imaging prior to his next appointment in 4 weeks. He will return then for review of imaging and re-evaluation.   Essential hypertension Hypertension is controlled with medication and BP normal today.   Hypomagnesemia Magnesium is normal today at 1.6.   The patient understands the plans discussed today and is in agreement with them.  He knows to contact our office if he develops concerns prior to his next appointment.     Melodye Ped, NP  [facility] [address] [place] [address] Dept: [phone] Dept Fax: [phone]   Orders Placed This Encounter  Procedures   CT Soft Tissue Neck W Contrast    Standing Status:   Future    Standing Expiration Date:   09/20/2022    Order Specific Question:   If indicated for the ordered procedure, I authorize the administration of contrast media per Radiology protocol    Answer:   Yes    Order Specific Question:   Preferred imaging location?    Answer:   External   CT Chest W Contrast    Standing Status:   Future    Standing Expiration Date:   09/20/2022    Order Specific Question:   If indicated for the ordered procedure, I authorize the administration of contrast media per Radiology protocol    Answer:   Yes    Order Specific Question:   Preferred imaging location?    Answer:   External   CBC and differential    This external  order was created through the Results Console.   CBC    This external order was created through the Results Console.   Basic metabolic panel    This external order was created through the Results Console.   Comprehensive metabolic panel    This external order was created through the Results Console.   Hepatic function panel    This external order was created through the Results Console.   Magnesium    This order was created through External Result Entry      CHIEF COMPLAINT:  CC: A 80 year old male with history of malignant melanoma here for 4 week evaluation  Current Treatment:  Nivolumab  INTERVAL HISTORY:  Whitney is here today for repeat clinical assessment. He denies fevers or chills. He denies pain. His appetite is good. His weight has been stable.  I have reviewed the past medical history, past surgical history, social history and family history with the patient and they are unchanged from previous note.  ALLERGIES:  has No Known Allergies.  MEDICATIONS:  Current Outpatient Medications  Medication Sig Dispense Refill   atorvastatin (LIPITOR) 80 MG tablet Take 80 mg by mouth at bedtime.     budesonide-formoterol (SYMBICORT) 80-4.5 MCG/ACT inhaler Inhale 2 puffs into the lungs 2 (two) times daily.     clotrimazole-betamethasone (LOTRISONE) cream Apply 1 application topically 2 (two) times daily. 30 g 0   Ferrous Fumarate (HEMOCYTE -  106 MG FE) 324 (106 Fe) MG TABS tablet Take 1 tablet by mouth.     furosemide (LASIX) 40 MG tablet Take 40 mg by mouth daily.     glimepiride (AMARYL) 4 MG tablet Take by mouth.     lisinopril (ZESTRIL) 10 MG tablet Take 10 mg by mouth daily.     magnesium oxide (MAG-OX) 400 (241.3 Mg) MG tablet Take 400 mg by mouth 2 (two) times daily.     metFORMIN (GLUCOPHAGE) 1000 MG tablet Take 1,000 mg by mouth 2 (two) times daily.     mirtazapine (REMERON) 15 MG tablet Take 15 mg by mouth at bedtime.     ondansetron (ZOFRAN) 4 MG tablet Take 1 tablet (4 mg  total) by mouth every 4 (four) hours as needed for nausea. (Patient not taking: No sig reported) 90 tablet 3   ondansetron (ZOFRAN-ODT) 4 MG disintegrating tablet Take by mouth at bedtime. (Patient not taking: No sig reported)     pioglitazone (ACTOS) 30 MG tablet Take 30 mg by mouth daily.     polyethylene glycol (MIRALAX / GLYCOLAX) 17 g packet Take 17 g by mouth daily. (Patient not taking: No sig reported)     prochlorperazine (COMPAZINE) 10 MG tablet Take 10 mg by mouth every 6 (six) hours as needed for nausea or vomiting. (Patient not taking: No sig reported)     sitaGLIPtin (JANUVIA) 100 MG tablet Take 100 mg by mouth daily.     spironolactone (ALDACTONE) 25 MG tablet Take 25 mg by mouth daily.     triamcinolone (KENALOG) 0.025 % cream Apply 1 application topically 2 (two) times daily.     No current facility-administered medications for this visit.    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 ([facility])  01/24/2015 Cancer Staging   Staging form: Melanoma of the Skin, AJCC 8th Edition - Clinical stage from 01/24/2015: Stage IIB (cT3b, cN0, cM0) - Signed by [name], MD on 08/19/2021 Histopathologic type: Malignant melanoma, NOS (except juvenile melanoma [id number]) Stage prefix: Initial diagnosis Laterality: Right Lymph-vascular invasion (LVI): LVI not present (absent)/not identified Diagnostic confirmation: Positive histology Specimen type: Excision Staged by: Managing physician Mitotic count: 10 Mitotic unit: mm2 Clark's level: Level IV Tumor-infiltrating lymphocytes: Unknown Neurotropism: Absent Presence of extranodal extension: Absent Breslow depth (mm): 40 Ulceration of the epidermis: Yes Microsatellites: No Primary tumor regression: Unknown Sentinel lymph node biopsy performed: No Matted nodes: No Prognostic indicators: Wide excision negative Stage used in treatment planning: Yes National guidelines used in  treatment planning: Yes Type of national guideline used in treatment planning: NCCN    07/18/2020 -  Chemotherapy   Patient is on Treatment Plan : MELANOMA Nivolumab + B12 q28d     07/29/2020 Initial Diagnosis   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 ([facility])       REVIEW OF SYSTEMS:   Constitutional: Denies fevers, chills or abnormal weight loss Eyes: Denies blurriness of vision Ears, nose, mouth, throat, and face: Denies mucositis or sore throat Respiratory: Denies cough, dyspnea or wheezes Cardiovascular: Denies palpitation, chest discomfort or lower extremity swelling Gastrointestinal:  Denies nausea, heartburn or change in bowel habits Skin: Denies abnormal skin rashes Lymphatics: Denies new lymphadenopathy or easy bruising Neurological:Denies numbness, tingling or new weaknesses Behavioral/Psych: Mood is stable, no new changes  All other systems were reviewed with the patient and are negative.   VITALS:  Blood pressure 127/65, pulse 85, temperature 98.4 F (36.9 C),  temperature source Oral, resp. rate 16, height 5\' 8"  (1.727 m), weight 158 lb (71.7 kg), SpO2 97 %.  Wt Readings from Last 3 Encounters:  09/20/21 158 lb (71.7 kg)  08/16/21 159 lb 4 oz (72.2 kg)  08/15/21 159 lb (72.1 kg)    Body mass index is 24.02 kg/m.  Performance status (ECOG): 1 - Symptomatic but completely ambulatory  PHYSICAL EXAM:   GENERAL:alert, no distress and comfortable SKIN: skin color, texture, turgor are normal, no rashes or significant lesions EYES: normal, Conjunctiva are pink and non-injected, sclera clear OROPHARYNX:no exudate, no erythema and lips, buccal mucosa, and tongue normal  NECK: supple, thyroid normal size, non-tender, without nodularity LYMPH:  no palpable lymphadenopathy in the cervical, axillary or inguinal LUNGS: clear to auscultation and percussion with normal breathing effort HEART: regular rate & rhythm and no murmurs and no lower extremity edema ABDOMEN:abdomen  soft, non-tender and normal bowel sounds Musculoskeletal:no cyanosis of digits and no clubbing  NEURO: alert & oriented x 3 with fluent speech, no focal motor/sensory deficits  LABORATORY DATA:  I have reviewed the data as listed    Component Value Date/Time   NA 136 (A) 09/20/2021 0000   K 4.2 09/20/2021 0000   CL 101 09/20/2021 0000   CO2 26 (A) 09/20/2021 0000   GLUCOSE 116 (H) 09/20/2020 1441   BUN 19 09/20/2021 0000   CREATININE 1.1 09/20/2021 0000   CREATININE 1.17 09/20/2020 1441   CALCIUM 9.0 09/20/2021 0000   PROT 7.4 09/20/2020 1441   ALBUMIN 4.3 09/20/2021 0000   AST 24 09/20/2021 0000   AST 15 09/20/2020 1441   ALT 16 09/20/2021 0000   ALT 13 09/20/2020 1441   ALKPHOS 51 09/20/2021 0000   BILITOT 0.6 09/20/2020 1441   GFRNONAA >60 09/20/2020 1441   GFRAA >60 04/22/2018 0645    No results found for: SPEP, UPEP  Lab Results  Component Value Date   WBC 4.6 09/20/2021   NEUTROABS 3.22 09/20/2021   HGB 12.7 (A) 09/20/2021   HCT 38 (A) 09/20/2021   MCV 95 (A) 07/16/2021   PLT 196 09/20/2021      Chemistry      Component Value Date/Time   NA 136 (A) 09/20/2021 0000   K 4.2 09/20/2021 0000   CL 101 09/20/2021 0000   CO2 26 (A) 09/20/2021 0000   BUN 19 09/20/2021 0000   CREATININE 1.1 09/20/2021 0000   CREATININE 1.17 09/20/2020 1441   GLU 157 09/20/2021 0000      Component Value Date/Time   CALCIUM 9.0 09/20/2021 0000   ALKPHOS 51 09/20/2021 0000   AST 24 09/20/2021 0000   AST 15 09/20/2020 1441   ALT 16 09/20/2021 0000   ALT 13 09/20/2020 1441   BILITOT 0.6 09/20/2020 1441       RADIOGRAPHIC STUDIES: I have personally reviewed the radiological images as listed and agreed with the findings in the report. No results found.

## 2021-09-20 NOTE — Assessment & Plan Note (Signed)
Hypertension is controlled with medication and BP normal today.

## 2021-09-20 NOTE — Assessment & Plan Note (Signed)
Magnesium is normal today at 1.6.

## 2021-09-24 MED FILL — Nivolumab IV Soln 100 MG/10ML: INTRAVENOUS | Qty: 48 | Status: AC

## 2021-09-25 ENCOUNTER — Encounter: Payer: Self-pay | Admitting: Oncology

## 2021-09-25 ENCOUNTER — Inpatient Hospital Stay: Payer: Medicare PPO

## 2021-09-25 ENCOUNTER — Other Ambulatory Visit: Payer: Self-pay

## 2021-09-25 VITALS — BP 117/68 | HR 92 | Temp 97.8°F | Resp 18 | Ht 68.0 in | Wt 160.1 lb

## 2021-09-25 DIAGNOSIS — C4339 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: Secondary | ICD-10-CM | POA: Diagnosis not present

## 2021-09-25 DIAGNOSIS — D539 Nutritional anemia, unspecified: Secondary | ICD-10-CM | POA: Diagnosis not present

## 2021-09-25 DIAGNOSIS — Z5112 Encounter for antineoplastic immunotherapy: Secondary | ICD-10-CM | POA: Diagnosis not present

## 2021-09-25 MED ORDER — SODIUM CHLORIDE 0.9% FLUSH
10.0000 mL | INTRAVENOUS | Status: DC | PRN
  Administered 2021-09-25: 10 mL

## 2021-09-25 MED ORDER — HEPARIN SOD (PORK) LOCK FLUSH 100 UNIT/ML IV SOLN
500.0000 [IU] | Freq: Once | INTRAVENOUS | Status: AC | PRN
  Administered 2021-09-25: 500 [IU]

## 2021-09-25 MED ORDER — SODIUM CHLORIDE 0.9 % IV SOLN
480.0000 mg | Freq: Once | INTRAVENOUS | Status: AC
  Administered 2021-09-25: 480 mg via INTRAVENOUS
  Filled 2021-09-25: qty 48

## 2021-09-25 MED ORDER — CYANOCOBALAMIN 1000 MCG/ML IJ SOLN
1000.0000 ug | Freq: Once | INTRAMUSCULAR | Status: AC
  Administered 2021-09-25: 1000 ug via INTRAMUSCULAR
  Filled 2021-09-25: qty 1

## 2021-09-25 MED ORDER — SODIUM CHLORIDE 0.9 % IV SOLN: Freq: Once | INTRAVENOUS | Status: AC

## 2021-09-25 NOTE — Patient Instructions (Signed)
[facility]  Discharge Instructions: Thank you for choosing [facility] to provide your oncology and hematology care.  If you have a lab appointment with the [facility], please go directly to the [facility] and check in at the registration area.   Wear comfortable clothing and clothing appropriate for easy access to any Portacath or PICC line.   We strive to give you quality time with your provider. You may need to reschedule your appointment if you arrive late (15 or more minutes).  Arriving late affects you and other patients whose appointments are after yours.  Also, if you miss three or more appointments without notifying the office, you may be dismissed from the clinic at the provider's discretion.      For prescription refill requests, have your pharmacy contact our office and allow 72 hours for refills to be completed.    Today you received the following chemotherapy and/or immunotherapy agents nivolumab    To help prevent nausea and vomiting after your treatment, we encourage you to take your nausea medication as directed.  BELOW ARE SYMPTOMS THAT SHOULD BE REPORTED IMMEDIATELY: *FEVER GREATER THAN 100.4 F (38 C) OR HIGHER *CHILLS OR SWEATING *NAUSEA AND VOMITING THAT IS NOT CONTROLLED WITH YOUR NAUSEA MEDICATION *UNUSUAL SHORTNESS OF BREATH *UNUSUAL BRUISING OR BLEEDING *URINARY PROBLEMS (pain or burning when urinating, or frequent urination) *BOWEL PROBLEMS (unusual diarrhea, constipation, pain near the anus) TENDERNESS IN MOUTH AND THROAT WITH OR WITHOUT PRESENCE OF ULCERS (sore throat, sores in mouth, or a toothache) UNUSUAL RASH, SWELLING OR PAIN  UNUSUAL VAGINAL DISCHARGE OR ITCHING   Items with * indicate a potential emergency and should be followed up as soon as possible or go to the Emergency Department if any problems should occur.  Please show the CHEMOTHERAPY ALERT CARD or IMMUNOTHERAPY ALERT CARD at check-in to the  Emergency Department and triage nurse.  Should you have questions after your visit or need to cancel or reschedule your appointment, please contact [facility]  Dept: [phone]  and follow the prompts.  Office hours are 8:00 a.m. to 4:30 p.m. Monday - Friday. Please note that voicemails left after 4:00 p.m. may not be returned until the following business day.  We are closed weekends and major holidays. You have access to a nurse at all times for urgent questions. Please call the main number to the clinic Dept: [phone] and follow the prompts.  For any non-urgent questions, you may also contact your provider using MyChart. We now offer e-Visits for anyone 12 and older to request care online for non-urgent symptoms. For details visit [email or web address].   Also download the MyChart app! Go to the app store, search "MyChart", open the app, select [facility], and log in with your MyChart username and password.  Due to Covid, a mask is required upon entering the hospital/clinic. If you do not have a mask, one will be given to you upon arrival. For doctor visits, patients may have 1 support person aged 78 or older with them. For treatment visits, patients cannot have anyone with them due to current Covid guidelines and our immunocompromised population.   Nivolumab injection What is this medication? NIVOLUMAB (nye VOL ue mab) is a monoclonal antibody. It treats certain types of cancer. Some of the cancers treated are colon cancer, head and neck cancer, Hodgkin lymphoma, lung cancer, and melanoma. This medicine may be used for other purposes; ask your health care provider or  pharmacist if you have questions. COMMON BRAND NAME(S): Opdivo What should I tell my care team before I take this medication? They need to know if you have any of these conditions: Autoimmune diseases such as Crohn's disease, ulcerative colitis, or lupus Have had or planning to have an allogeneic  stem cell transplant (uses someone else's stem cells) History of chest radiation Organ transplant Nervous system problems such as myasthenia gravis or Guillain-Barre syndrome An unusual or allergic reaction to nivolumab, other medicines, foods, dyes, or preservatives Pregnant or trying to get pregnant Breast-feeding How should I use this medication? This medication is injected into a vein. It is given in a hospital or clinic setting. A special MedGuide will be given to you before each treatment. Be sure to read this information carefully each time. Talk to your care team regarding the use of this medication in children. While it may be prescribed for children as young as 12 years for selected conditions, precautions do apply. Overdosage: If you think you have taken too much of this medicine contact a poison control center or emergency room at once. NOTE: This medicine is only for you. Do not share this medicine with others. What if I miss a dose? Keep appointments for follow-up doses. It is important not to miss your dose. Call your care team if you are unable to keep an appointment. What may interact with this medication? Interactions have not been studied. This list may not describe all possible interactions. Give your health care provider a list of all the medicines, herbs, non-prescription drugs, or dietary supplements you use. Also tell them if you smoke, drink alcohol, or use illegal drugs. Some items may interact with your medicine. What should I watch for while using this medication? Your condition will be monitored carefully while you are receiving this medication. You may need blood work done while you are taking this medication. Do not become pregnant while taking this medication or for 5 months after stopping it. Women should inform their care team if they wish to become pregnant or think they might be pregnant. There is a potential for serious harm to an unborn child. Talk to your  care team for more information. Do not breast-feed an infant while taking this medication or for 5 months after stopping it. What side effects may I notice from receiving this medication? Side effects that you should report to your care team as soon as possible: Allergic reactions--skin rash, itching, hives, swelling of the face, lips, tongue, or throat Bloody or black, tar-like stools Change in vision Chest pain Diarrhea Dry cough, shortness of breath or trouble breathing Eye pain Fast or irregular heartbeat Fever, chills High blood sugar (hyperglycemia)--increased thirst or amount of urine, unusual weakness or fatigue, blurry vision High thyroid levels (hyperthyroidism)--fast or irregular heartbeat, weight loss, excessive sweating or sensitivity to heat, tremors or shaking, anxiety, nervousness, irregular menstrual cycle or spotting Kidney injury--decrease in the amount of urine, swelling of the ankles, hands, or feet Liver injury--right upper belly pain, loss of appetite, nausea, light-colored stool, dark yellow or brown urine, yellowing skin or eyes, unusual weakness or fatigue Low red blood cell count--unusual weakness or fatigue, dizziness, headache, trouble breathing Low thyroid levels (hypothyroidism)--unusual weakness or fatigue, increased sensitivity to cold, constipation, hair loss, dry skin, weight gain, feelings of depression Mood and behavior changes-confusion, change in sex drive or performance, irritability Muscle pain or cramps Pain, tingling, or numbness in the hands or feet, muscle weakness, trouble walking, loss of balance  or coordination Red or dark brown urine Redness, blistering, peeling, or loosening of the skin, including inside the mouth Stomach pain Unusual bruising or bleeding Side effects that usually do not require medical attention (report to your care team if they continue or are bothersome): Bone pain Constipation Loss of  appetite Nausea Tiredness Vomiting This list may not describe all possible side effects. Call your doctor for medical advice about side effects. You may report side effects to FDA at 1-800-FDA-1088. Where should I keep my medication? This medication is given in a hospital or clinic and will not be stored at home. NOTE: This sheet is a summary. It may not cover all possible information. If you have questions about this medicine, talk to your doctor, pharmacist, or health care provider.  2022 Elsevier/Gold Standard (2021-07-16 00:00:00) Vitamin B12 Deficiency Vitamin B12 deficiency means that your body does not have enough vitamin B12. The body needs this important vitamin: To make red blood cells. To make genes (DNA). To help the nerves work. If you do not have enough vitamin B12 in your body, you can have health problems, such as not having enough red blood cells in the blood (anemia). What are the causes? Not eating enough foods that contain vitamin B12. Not being able to take in (absorb) vitamin B12 from the food that you eat. Certain diseases. A condition in which the body does not make enough of a certain protein. This results in your body not taking in enough vitamin B12. Having a surgery in which part of the stomach or small intestine is taken out. Taking medicines that make it hard for the body to take in vitamin B12. These include: Heartburn medicines. Some medicines that are used to treat diabetes. What increases the risk? Being an older adult. Eating a vegetarian or vegan diet that does not include any foods that come from animals. Not eating enough foods that contain vitamin B12 while you are pregnant. Taking certain medicines. Having alcoholism. What are the signs or symptoms? In some cases, there are no symptoms. If the condition leads to too few blood cells or nerve damage, symptoms can occur, such as: Feeling weak or tired. Not being hungry. Losing feeling (numbness)  or tingling in your hands and feet. Redness and burning of the tongue. Feeling sad (depressed). Confusion or memory problems. Trouble walking. If anemia is very bad, symptoms can include: Being short of breath. Being dizzy. Having a very fast heartbeat. How is this treated? Changing the way you eat and drink, such as: Eating more foods that contain vitamin B12. Drinking little or no alcohol. Getting vitamin B12 shots. Taking vitamin B12 supplements by mouth (orally). Your doctor will tell you the dose that is best for you. Follow these instructions at home: Eating and drinking  Eat foods that come from animals and have a lot of vitamin B12 in them. These include: Meats and poultry. This includes beef, pork, chicken, [place], and organ meats, such as liver. Seafood, such as clams, rainbow trout, salmon, tuna, and haddock. Eggs. Dairy foods such as milk, yogurt, and cheese. Eat breakfast cereals that have vitamin B12 added to them (are fortified). Check the label. The items listed above may not be a complete list of foods and beverages you can eat and drink. Contact a dietitian for more information. Alcohol use Do not drink alcohol if: Your doctor tells you not to drink. You are pregnant, may be pregnant, or are planning to become pregnant. If you drink alcohol: Limit  how much you have to: 0-1 drink a day for women. 0-2 drinks a day for men. Know how much alcohol is in your drink. In the [place] one drink equals one 12 oz bottle of beer (355 mL), one 5 oz glass of wine (148 mL), or one 1 oz glass of hard liquor (44 mL). General instructions Get any vitamin B12 shots if told by your doctor. Take supplements only as told by your doctor. Follow the directions. Keep all follow-up visits. Contact a doctor if: Your symptoms come back. Your symptoms get worse or do not get better with treatment. Get help right away if: You have trouble breathing. You have a very fast heartbeat. You  have chest pain. You get dizzy. You faint. These symptoms may be an emergency. Get help right away. Call 911. Do not wait to see if the symptoms will go away. Do not drive yourself to the hospital. Summary Vitamin B12 deficiency means that your body is not getting enough of the vitamin. In some cases, there are no symptoms of this condition. Treatment may include making a change in the way you eat and drink, getting shots, or taking supplements. Eat foods that have vitamin B12 in them. This information is not intended to replace advice given to you by your health care provider. Make sure you discuss any questions you have with your health care provider. Document Revised: 06/21/2021 Document Reviewed: 06/21/2021 Elsevier Patient Education  2022 [employer].

## 2021-10-06 ENCOUNTER — Other Ambulatory Visit: Payer: Self-pay

## 2021-10-06 ENCOUNTER — Encounter (HOSPITAL_COMMUNITY): Payer: Self-pay | Admitting: Emergency Medicine

## 2021-10-06 ENCOUNTER — Emergency Department (HOSPITAL_COMMUNITY): Payer: Medicare PPO

## 2021-10-06 ENCOUNTER — Inpatient Hospital Stay (HOSPITAL_COMMUNITY)
Admission: EM | Admit: 2021-10-06 | Discharge: 2021-10-11 | DRG: 286 | Disposition: A | Discharge status: Home Health | Payer: Medicare PPO | Attending: Student in an Organized Health Care Education/Training Program | Admitting: Student in an Organized Health Care Education/Training Program

## 2021-10-06 DIAGNOSIS — Z803 Family history of malignant neoplasm of breast: Secondary | ICD-10-CM | POA: Diagnosis not present

## 2021-10-06 DIAGNOSIS — Z955 Presence of coronary angioplasty implant and graft: Secondary | ICD-10-CM | POA: Diagnosis not present

## 2021-10-06 DIAGNOSIS — Z66 Do not resuscitate: Secondary | ICD-10-CM | POA: Diagnosis present

## 2021-10-06 DIAGNOSIS — I110 Hypertensive heart disease with heart failure: Principal | ICD-10-CM | POA: Diagnosis present

## 2021-10-06 DIAGNOSIS — R0609 Other forms of dyspnea: Secondary | ICD-10-CM | POA: Diagnosis not present

## 2021-10-06 DIAGNOSIS — I2582 Chronic total occlusion of coronary artery: Secondary | ICD-10-CM | POA: Diagnosis present

## 2021-10-06 DIAGNOSIS — E119 Type 2 diabetes mellitus without complications: Secondary | ICD-10-CM | POA: Diagnosis present

## 2021-10-06 DIAGNOSIS — E871 Hypo-osmolality and hyponatremia: Secondary | ICD-10-CM | POA: Diagnosis not present

## 2021-10-06 DIAGNOSIS — I4891 Unspecified atrial fibrillation: Secondary | ICD-10-CM

## 2021-10-06 DIAGNOSIS — I255 Ischemic cardiomyopathy: Secondary | ICD-10-CM | POA: Diagnosis present

## 2021-10-06 DIAGNOSIS — Z87891 Personal history of nicotine dependence: Secondary | ICD-10-CM

## 2021-10-06 DIAGNOSIS — I252 Old myocardial infarction: Secondary | ICD-10-CM | POA: Diagnosis not present

## 2021-10-06 DIAGNOSIS — Z8041 Family history of malignant neoplasm of ovary: Secondary | ICD-10-CM | POA: Diagnosis not present

## 2021-10-06 DIAGNOSIS — E875 Hyperkalemia: Secondary | ICD-10-CM | POA: Diagnosis present

## 2021-10-06 DIAGNOSIS — R739 Hyperglycemia, unspecified: Secondary | ICD-10-CM | POA: Diagnosis not present

## 2021-10-06 DIAGNOSIS — R0689 Other abnormalities of breathing: Secondary | ICD-10-CM | POA: Diagnosis not present

## 2021-10-06 DIAGNOSIS — Z7984 Long term (current) use of oral hypoglycemic drugs: Secondary | ICD-10-CM

## 2021-10-06 DIAGNOSIS — E785 Hyperlipidemia, unspecified: Secondary | ICD-10-CM | POA: Diagnosis present

## 2021-10-06 DIAGNOSIS — R9431 Abnormal electrocardiogram [ECG] [EKG]: Secondary | ICD-10-CM | POA: Diagnosis not present

## 2021-10-06 DIAGNOSIS — J101 Influenza due to other identified influenza virus with other respiratory manifestations: Secondary | ICD-10-CM

## 2021-10-06 DIAGNOSIS — Z515 Encounter for palliative care: Secondary | ICD-10-CM | POA: Diagnosis not present

## 2021-10-06 DIAGNOSIS — R059 Cough, unspecified: Secondary | ICD-10-CM | POA: Diagnosis not present

## 2021-10-06 DIAGNOSIS — I480 Paroxysmal atrial fibrillation: Secondary | ICD-10-CM | POA: Diagnosis present

## 2021-10-06 DIAGNOSIS — R0602 Shortness of breath: Secondary | ICD-10-CM | POA: Diagnosis not present

## 2021-10-06 DIAGNOSIS — D638 Anemia in other chronic diseases classified elsewhere: Secondary | ICD-10-CM | POA: Diagnosis not present

## 2021-10-06 DIAGNOSIS — C439 Malignant melanoma of skin, unspecified: Secondary | ICD-10-CM | POA: Diagnosis not present

## 2021-10-06 DIAGNOSIS — Z7951 Long term (current) use of inhaled steroids: Secondary | ICD-10-CM

## 2021-10-06 DIAGNOSIS — I5020 Unspecified systolic (congestive) heart failure: Secondary | ICD-10-CM

## 2021-10-06 DIAGNOSIS — I5023 Acute on chronic systolic (congestive) heart failure: Secondary | ICD-10-CM | POA: Diagnosis not present

## 2021-10-06 DIAGNOSIS — J441 Chronic obstructive pulmonary disease with (acute) exacerbation: Secondary | ICD-10-CM | POA: Diagnosis present

## 2021-10-06 DIAGNOSIS — J189 Pneumonia, unspecified organism: Secondary | ICD-10-CM | POA: Diagnosis not present

## 2021-10-06 DIAGNOSIS — I2510 Atherosclerotic heart disease of native coronary artery without angina pectoris: Secondary | ICD-10-CM | POA: Diagnosis present

## 2021-10-06 DIAGNOSIS — J9621 Acute and chronic respiratory failure with hypoxia: Secondary | ICD-10-CM | POA: Diagnosis present

## 2021-10-06 DIAGNOSIS — R7989 Other specified abnormal findings of blood chemistry: Secondary | ICD-10-CM | POA: Diagnosis present

## 2021-10-06 DIAGNOSIS — I5042 Chronic combined systolic (congestive) and diastolic (congestive) heart failure: Secondary | ICD-10-CM | POA: Diagnosis not present

## 2021-10-06 DIAGNOSIS — Z79899 Other long term (current) drug therapy: Secondary | ICD-10-CM | POA: Diagnosis not present

## 2021-10-06 DIAGNOSIS — Z20822 Contact with and (suspected) exposure to covid-19: Secondary | ICD-10-CM | POA: Diagnosis not present

## 2021-10-06 DIAGNOSIS — Z833 Family history of diabetes mellitus: Secondary | ICD-10-CM

## 2021-10-06 DIAGNOSIS — Z95810 Presence of automatic (implantable) cardiac defibrillator: Secondary | ICD-10-CM

## 2021-10-06 DIAGNOSIS — R0902 Hypoxemia: Secondary | ICD-10-CM | POA: Diagnosis not present

## 2021-10-06 DIAGNOSIS — Z9181 History of falling: Secondary | ICD-10-CM

## 2021-10-06 DIAGNOSIS — Z7189 Other specified counseling: Secondary | ICD-10-CM | POA: Diagnosis not present

## 2021-10-06 DIAGNOSIS — J80 Acute respiratory distress syndrome: Secondary | ICD-10-CM | POA: Diagnosis not present

## 2021-10-06 DIAGNOSIS — R1084 Generalized abdominal pain: Secondary | ICD-10-CM | POA: Diagnosis not present

## 2021-10-06 DIAGNOSIS — N179 Acute kidney failure, unspecified: Secondary | ICD-10-CM | POA: Diagnosis not present

## 2021-10-06 DIAGNOSIS — I517 Cardiomegaly: Secondary | ICD-10-CM | POA: Diagnosis not present

## 2021-10-06 LAB — COMPREHENSIVE METABOLIC PANEL
ALT: 79 U/L — ABNORMAL HIGH (ref 0–44)
AST: 104 U/L — ABNORMAL HIGH (ref 15–41)
Albumin: 3.3 g/dL — ABNORMAL LOW (ref 3.5–5.0)
Alkaline Phosphatase: 104 U/L (ref 38–126)
Anion gap: 14 (ref 5–15)
BUN: 40 mg/dL — ABNORMAL HIGH (ref 8–23)
CO2: 22 mmol/L (ref 22–32)
Calcium: 9 mg/dL (ref 8.9–10.3)
Chloride: 96 mmol/L — ABNORMAL LOW (ref 98–111)
Creatinine, Ser: 1.29 mg/dL — ABNORMAL HIGH (ref 0.61–1.24)
GFR, Estimated: 56 mL/min — ABNORMAL LOW (ref >60)
Glucose, Bld: 206 mg/dL — ABNORMAL HIGH (ref 70–99)
Potassium: 4.4 mmol/L (ref 3.5–5.1)
Sodium: 132 mmol/L — ABNORMAL LOW (ref 135–145)
Total Bilirubin: 0.7 mg/dL (ref 0.3–1.2)
Total Protein: 6.7 g/dL (ref 6.5–8.1)

## 2021-10-06 LAB — CBC WITH DIFFERENTIAL/PLATELET
Abs Immature Granulocytes: 0.06 10*3/uL (ref 0.00–0.07)
Basophils Absolute: 0 10*3/uL (ref 0.0–0.1)
Basophils Relative: 0 %
Eosinophils Absolute: 0 10*3/uL (ref 0.0–0.5)
Eosinophils Relative: 0 %
HCT: 35.9 % — ABNORMAL LOW (ref 39.0–52.0)
Hemoglobin: 11.5 g/dL — ABNORMAL LOW (ref 13.0–17.0)
Immature Granulocytes: 1 %
Lymphocytes Relative: 5 %
Lymphs Abs: 0.3 10*3/uL — ABNORMAL LOW (ref 0.7–4.0)
MCH: 30.9 pg (ref 26.0–34.0)
MCHC: 32 g/dL (ref 30.0–36.0)
MCV: 96.5 fL (ref 80.0–100.0)
Monocytes Absolute: 0.4 10*3/uL (ref 0.1–1.0)
Monocytes Relative: 6 %
Neutro Abs: 5.3 10*3/uL (ref 1.7–7.7)
Neutrophils Relative %: 88 %
Platelets: 199 10*3/uL (ref 150–400)
RBC: 3.72 MIL/uL — ABNORMAL LOW (ref 4.22–5.81)
RDW: 14.4 % (ref 11.5–15.5)
WBC: 6.1 10*3/uL (ref 4.0–10.5)
nRBC: 0 % (ref 0.0–0.2)

## 2021-10-06 LAB — RESP PANEL BY RT-PCR (FLU A&B, COVID) ARPGX2
Influenza A by PCR: POSITIVE — AB
Influenza B by PCR: NEGATIVE
SARS Coronavirus 2 by RT PCR: NEGATIVE

## 2021-10-06 LAB — GLUCOSE, CAPILLARY: Glucose-Capillary: 198 mg/dL — ABNORMAL HIGH (ref 70–99)

## 2021-10-06 LAB — TSH: TSH: 1.736 u[IU]/mL (ref 0.350–4.500)

## 2021-10-06 LAB — BRAIN NATRIURETIC PEPTIDE: B Natriuretic Peptide: 1252.4 pg/mL — ABNORMAL HIGH (ref 0.0–100.0)

## 2021-10-06 LAB — TROPONIN I (HIGH SENSITIVITY)
Troponin I (High Sensitivity): 59 ng/L — ABNORMAL HIGH (ref <18)
Troponin I (High Sensitivity): 55 ng/L — ABNORMAL HIGH (ref <18)

## 2021-10-06 MED ORDER — GUAIFENESIN 100 MG/5ML PO LIQD: 5.0000 mL | ORAL | Status: DC | PRN

## 2021-10-06 MED ORDER — DILTIAZEM LOAD VIA INFUSION: 20.0000 mg | Freq: Once | INTRAVENOUS | Status: DC

## 2021-10-06 MED ORDER — ACETAMINOPHEN 650 MG RE SUPP: 650.0000 mg | Freq: Four times a day (QID) | RECTAL | Status: DC | PRN

## 2021-10-06 MED ORDER — ATORVASTATIN CALCIUM 80 MG PO TABS
80.0000 mg | ORAL_TABLET | Freq: Every day | ORAL | Status: DC
  Administered 2021-10-06 – 2021-10-10 (×5): 80 mg via ORAL
  Filled 2021-10-06 (×5): qty 1

## 2021-10-06 MED ORDER — DILTIAZEM HCL-DEXTROSE 125-5 MG/125ML-% IV SOLN (PREMIX)
5.0000 mg/h | INTRAVENOUS | Status: DC
  Administered 2021-10-06: 21:00:00 5 mg/h via INTRAVENOUS
  Administered 2021-10-07: 10:00:00 10 mg/h via INTRAVENOUS
  Filled 2021-10-06 (×2): qty 125

## 2021-10-06 MED ORDER — FUROSEMIDE 10 MG/ML IJ SOLN
40.0000 mg | Freq: Once | INTRAMUSCULAR | Status: AC
  Administered 2021-10-06: 18:00:00 40 mg via INTRAVENOUS
  Filled 2021-10-06: qty 4

## 2021-10-06 MED ORDER — ENOXAPARIN SODIUM 40 MG/0.4ML IJ SOSY
40.0000 mg | PREFILLED_SYRINGE | INTRAMUSCULAR | Status: DC
  Administered 2021-10-06 – 2021-10-08 (×3): 40 mg via SUBCUTANEOUS
  Filled 2021-10-06 (×3): qty 0.4

## 2021-10-06 MED ORDER — MOMETASONE FURO-FORMOTEROL FUM 100-5 MCG/ACT IN AERO
2.0000 | INHALATION_SPRAY | Freq: Two times a day (BID) | RESPIRATORY_TRACT | Status: DC
  Administered 2021-10-07 – 2021-10-11 (×8): 2 via RESPIRATORY_TRACT
  Filled 2021-10-06: qty 8.8

## 2021-10-06 MED ORDER — SENNOSIDES-DOCUSATE SODIUM 8.6-50 MG PO TABS: 1.0000 | ORAL_TABLET | Freq: Every evening | ORAL | Status: DC | PRN

## 2021-10-06 MED ORDER — ALBUTEROL SULFATE HFA 108 (90 BASE) MCG/ACT IN AERS
2.0000 | INHALATION_SPRAY | Freq: Once | RESPIRATORY_TRACT | Status: AC
  Administered 2021-10-06: 18:00:00 2 via RESPIRATORY_TRACT
  Filled 2021-10-06: qty 6.7

## 2021-10-06 MED ORDER — ACETAMINOPHEN 325 MG PO TABS: 650.0000 mg | ORAL_TABLET | Freq: Four times a day (QID) | ORAL | Status: DC | PRN

## 2021-10-06 MED ORDER — ONDANSETRON HCL 4 MG PO TABS: 4.0000 mg | ORAL_TABLET | Freq: Four times a day (QID) | ORAL | Status: DC | PRN

## 2021-10-06 MED ORDER — DILTIAZEM HCL 25 MG/5ML IV SOLN
20.0000 mg | Freq: Once | INTRAVENOUS | Status: AC
Start: 2021-10-06 — End: 2021-10-06
  Administered 2021-10-06: 20:00:00 20 mg via INTRAVENOUS
  Filled 2021-10-06: qty 5

## 2021-10-06 MED ORDER — PREDNISONE 20 MG PO TABS
40.0000 mg | ORAL_TABLET | Freq: Every day | ORAL | Status: AC
  Administered 2021-10-06 – 2021-10-10 (×5): 40 mg via ORAL
  Filled 2021-10-06 (×5): qty 2

## 2021-10-06 MED ORDER — INSULIN ASPART 100 UNIT/ML IJ SOLN
0.0000 [IU] | Freq: Three times a day (TID) | INTRAMUSCULAR | Status: DC
  Administered 2021-10-07 (×2): 5 [IU] via SUBCUTANEOUS

## 2021-10-06 MED ORDER — ONDANSETRON HCL 4 MG/2ML IJ SOLN
4.0000 mg | Freq: Four times a day (QID) | INTRAMUSCULAR | Status: DC | PRN
  Administered 2021-10-08: 4 mg via INTRAVENOUS
  Filled 2021-10-06: qty 2

## 2021-10-06 MED ORDER — MAGNESIUM OXIDE -MG SUPPLEMENT 400 (240 MG) MG PO TABS
400.0000 mg | ORAL_TABLET | Freq: Two times a day (BID) | ORAL | Status: DC
  Administered 2021-10-06 – 2021-10-11 (×10): 400 mg via ORAL
  Filled 2021-10-06 (×10): qty 1

## 2021-10-06 MED ORDER — ALBUTEROL SULFATE (2.5 MG/3ML) 0.083% IN NEBU
2.5000 mg | INHALATION_SOLUTION | Freq: Four times a day (QID) | RESPIRATORY_TRACT | Status: DC
  Administered 2021-10-06 – 2021-10-07 (×3): 2.5 mg via RESPIRATORY_TRACT
  Filled 2021-10-06 (×4): qty 3

## 2021-10-06 NOTE — ED Provider Notes (Signed)
[facility] EMERGENCY DEPARTMENT Provider Note   CSN: [id number] Arrival date & time: 10/06/21  1551     History Chief Complaint  Patient presents with   Shortness of Breath    Kevin [name] is a 80 y.o. male with history of COPD (heavy smoker, not on home oxygen), history of CHF on 40 mg of Lasix daily, history hypertension, hyperlipidemia, diabetes, melanoma of the skin, presenting to the ED with generalized fatigue, feeling ill and short of breath.  Multiple family members in the house had influenza earlier this week.  The patient himself has been feeling sick for 7 days.  He describes malaise, poor appetite, cough, worsening dyspnea on exertion.  He reports compliance with all of his medications.  He denies any chest pain or pressure.  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) ([name])     Patient Active Problem List   Diagnosis Date Noted   Influenza due to influenza A virus 10/06/2021   Macrocytic anemia 07/16/2021   Hypomagnesemia 10/17/2020    Class: Chronic   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 ([name]) 07/29/2020   B12 deficiency anemia 08/19/2018    Class: Chronic   Hypoglycemia 04/21/2018   Chronic combined systolic and diastolic CHF (congestive heart failure) ([place]) 04/21/2018   Essential hypertension 04/21/2018   Diabetes mellitus type 2 in nonobese ([place]) 04/21/2018   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) ([name]) 03/20/2015    Class: Chronic    Past Surgical History:  Procedure Laterality Date   APPENDECTOMY     CARDIAC  CATHETERIZATION     CORONARY ANGIOPLASTY     PACEMAKER INSERTION         Family History  Problem Relation Age of Onset   Diabetes Mellitus II Father    Breast cancer Mother    Ovarian cancer Sister    Cancer Brother    Cancer Maternal Uncle    Breast cancer Niece    Cancer Brother     Social History   Tobacco Use   Smoking status: Every Day    Packs/day: 0.50    Years: 60.00    Pack years: 30.00    Types: Cigarettes   Smokeless tobacco: Never  Substance Use Topics   Alcohol use: Not Currently   Drug use: Not Currently    Home Medications Prior to Admission medications   Medication Sig Start Date End Date Taking? Authorizing Provider  atorvastatin (LIPITOR) 80 MG tablet Take 80 mg by mouth at bedtime.   Yes [provider]  budesonide-formoterol (SYMBICORT) 80-4.5 MCG/ACT inhaler Inhale 2 puffs into the lungs 2 (two) times daily.   Yes [provider]  Ferrous Fumarate (HEMOCYTE - 106 MG FE) 324 (106 Fe) MG TABS tablet Take 1 tablet by mouth.   Yes [provider]  furosemide (LASIX) 40 MG tablet Take 40 mg by mouth daily.   Yes [provider]  magnesium oxide (MAG-OX) 400 (241.3 Mg) MG tablet Take 400 mg by mouth 2 (two) times daily.  Yes [provider]  metFORMIN (GLUCOPHAGE) 1000 MG tablet Take 1,000 mg by mouth 2 (two) times daily. 12/26/20  Yes [provider]  pioglitazone (ACTOS) 30 MG tablet Take 30 mg by mouth daily.   Yes [provider]  spironolactone (ALDACTONE) 25 MG tablet Take 25 mg by mouth daily.   Yes [provider]  clotrimazole-betamethasone (LOTRISONE) cream Apply 1 application topically 2 (two) times daily. Patient not taking: Reported on 10/06/2021 10/17/20   [name], MD  ondansetron (ZOFRAN) 4 MG tablet Take 1 tablet (4 mg total) by mouth every 4 (four) hours as needed for nausea. Patient not taking: Reported on 05/07/2021 09/20/20   [name] A, NP   ondansetron (ZOFRAN-ODT) 4 MG disintegrating tablet Take by mouth at bedtime. Patient not taking: Reported on 05/07/2021 08/14/20   [provider]    Allergies    Patient has no known allergies.  Review of Systems   Review of Systems  Constitutional:  Positive for appetite change and fatigue. Negative for chills and fever.  Eyes:  Negative for pain and visual disturbance.  Respiratory:  Positive for shortness of breath. Negative for cough.   Cardiovascular:  Positive for chest pain. Negative for palpitations.  Gastrointestinal:  Positive for nausea. Negative for abdominal pain and vomiting.  Musculoskeletal:  Negative for arthralgias and myalgias.  Skin:  Negative for color change and rash.  Neurological:  Negative for syncope and headaches.  All other systems reviewed and are negative.  Physical Exam Updated Vital Signs BP (!) 126/94 (BP Location: Right Arm)   Pulse 94   Temp 98.8 F (37.1 C) (Oral)   Resp (!) 23   Ht 5\' 8"  (1.727 m)   Wt 69.2 kg   SpO2 94%   BMI 23.20 kg/m   Physical Exam Constitutional:      General: He is not in acute distress. HENT:     Head: Normocephalic and atraumatic.  Eyes:     Conjunctiva/sclera: Conjunctivae normal.     Pupils: Pupils are equal, round, and reactive to light.  Cardiovascular:     Rate and Rhythm: Tachycardia present. Rhythm irregular.  Pulmonary:     Effort: Pulmonary effort is normal. No respiratory distress.     Comments: 88% on room air, 92% on 2L [place], rhonchi bilaterally Abdominal:     General: There is no distension.     Tenderness: There is no abdominal tenderness.  Skin:    General: Skin is warm and dry.  Neurological:     General: No focal deficit present.     Mental Status: He is alert. Mental status is at baseline.  Psychiatric:        Mood and Affect: Mood normal.        Behavior: Behavior normal.    ED Results / Procedures / Treatments   Labs (all labs ordered are listed, but only abnormal  results are displayed) Labs Reviewed  RESP PANEL BY RT-PCR (FLU A&B, COVID) ARPGX2 - Abnormal; Notable for the following components:      Result Value   Influenza A by PCR POSITIVE (*)    All other components within normal limits  CBC WITH DIFFERENTIAL/PLATELET - Abnormal; Notable for the following components:   RBC 3.72 (*)    Hemoglobin 11.5 (*)    HCT 35.9 (*)    Lymphs Abs 0.3 (*)    All other components within normal limits  COMPREHENSIVE METABOLIC PANEL - Abnormal; Notable for the following components:  Sodium 132 (*)    Chloride 96 (*)    Glucose, Bld 206 (*)    BUN 40 (*)    Creatinine, Ser 1.29 (*)    Albumin 3.3 (*)    AST 104 (*)    ALT 79 (*)    GFR, Estimated 56 (*)    All other components within normal limits  BRAIN NATRIURETIC PEPTIDE - Abnormal; Notable for the following components:   B Natriuretic Peptide 1,252.4 (*)    All other components within normal limits  TROPONIN I (HIGH SENSITIVITY) - Abnormal; Notable for the following components:   Troponin I (High Sensitivity) 59 (*)    All other components within normal limits  TROPONIN I (HIGH SENSITIVITY) - Abnormal; Notable for the following components:   Troponin I (High Sensitivity) 55 (*)    All other components within normal limits  CBC  MAGNESIUM  COMPREHENSIVE METABOLIC PANEL  TSH  HEMOGLOBIN A1C    EKG EKG Interpretation  Date/Time:  Sunday October 06 2021 16:17:21 EST Ventricular Rate:  134 PR Interval:    QRS Duration: 173 QT Interval:  370 QTC Calculation: 553 R Axis:   270 Text Interpretation: LBBB noted on prior tracing, Sinus rhythm Confirmed by Octaviano Glow [phone]) on 10/06/2021 4:43:23 PM  Radiology DG Chest 2 View  Result Date: 10/06/2021 CLINICAL DATA:  Shortness of breath with cough and congestion for several days. EXAM: CHEST - 2 VIEW COMPARISON:  Radiographs 01/04/2018.  CT 04/30/2021. FINDINGS: Right IJ Port-A-Cath appears unchanged, extending to the level of the mid  SVC. Left subclavian pacemaker leads appear unchanged. Mildly progressive cardiomegaly from 2019. The pulmonary vascularity appears normal, and there is no edema, confluent airspace opacity, pleural effusion or pneumothorax. No acute osseous findings are evident. Postsurgical changes are present at the right shoulder with a chronically fractured cerclage wire. IMPRESSION: Mildly progressive cardiomegaly without evidence of acute cardiopulmonary process. Electronically Signed   By: Richardean Sale M.D.   On: 10/06/2021 16:35    Procedures Procedures   Medications Ordered in ED Medications  enoxaparin (LOVENOX) injection 40 mg (40 mg Subcutaneous Given 10/06/21 1935)  acetaminophen (TYLENOL) tablet 650 mg (has no administration in time range)    Or  acetaminophen (TYLENOL) suppository 650 mg (has no administration in time range)  senna-docusate (Senokot-S) tablet 1 tablet (has no administration in time range)  ondansetron (ZOFRAN) tablet 4 mg (has no administration in time range)    Or  ondansetron (ZOFRAN) injection 4 mg (has no administration in time range)  albuterol (PROVENTIL) (2.5 MG/3ML) 0.083% nebulizer solution 2.5 mg (2.5 mg Nebulization Given 10/06/21 1940)  guaiFENesin (ROBITUSSIN) 100 MG/5ML liquid 5 mL (has no administration in time range)  atorvastatin (LIPITOR) tablet 80 mg (80 mg Oral Given 10/06/21 2113)  mometasone-formoterol (DULERA) 100-5 MCG/ACT inhaler 2 puff (has no administration in time range)  magnesium oxide (MAG-OX) tablet 400 mg (400 mg Oral Given 10/06/21 2113)  predniSONE (DELTASONE) tablet 40 mg (40 mg Oral Given 10/06/21 1936)  diltiazem (CARDIZEM) 125 mg in dextrose 5% 125 mL (1 mg/mL) infusion (5 mg/hr Intravenous New Bag/Given 10/06/21 2112)  furosemide (LASIX) injection 40 mg (40 mg Intravenous Given 10/06/21 1806)  albuterol (VENTOLIN HFA) 108 (90 Base) MCG/ACT inhaler 2 puff (2 puffs Inhalation Given 10/06/21 1806)  diltiazem (CARDIZEM) injection 20 mg  (20 mg Intravenous Given 10/06/21 1944)    ED Course  I have reviewed the triage vital signs and the nursing notes.  Pertinent labs & imaging results that were  available during my care of the patient were reviewed by me and considered in my medical decision making (see chart for details).  Patient is here with influenza type symptoms for a week, multiple sick contacts in the house were positive for influenza.  However he does appear to have worsening of his congestive heart failure, as well as new onset atrial fibrillation.  Heart rate is about 110 220 bpm in the room.  He does also appear to have some mildly worsening creatinine function, suggestive of dehydration.  He is also wheezing on exam.  I suspect this is likely combination of congestive heart failure, COPD, and new onset A Fib.  I have a lower suspicion for ACS with troponin in the 50s.  I personally reviewed and interpreted the patient's imaging, labs, and EKG.  Supplemental history was provided by the patient's daughter at bedside.  Clinical Course as of 10/06/21 2125  Nancy Fetter Oct 06, 2021  1802 Patient updated regarding plans for admission to the hospital service.  I did sign out to the hospitalist now. [MT]    Clinical Course User Index [MT] Demarrius Guerrero, Carola Rhine, MD   Final Clinical Impression(s) / ED Diagnoses Final diagnoses:  COPD exacerbation ([place])  New onset atrial fibrillation [facility])  Influenza A    Rx / DC Orders ED Discharge Orders     None        Wyvonnia Dusky, MD 10/06/21 2125

## 2021-10-06 NOTE — ED Triage Notes (Signed)
Patient presents to the ED by EMS with c/o not feeling well for the last few days. He lives in the home with others who are also feeling ill. Upper lobes with wheezing. 2 albuterol given PTA. Hx of COPD last cigarette 1 week ago.  Reports intermittent hot/cold. Sats improved from 90 to 100 after albuterol tx.

## 2021-10-06 NOTE — Plan of Care (Signed)

## 2021-10-06 NOTE — H&P (Signed)
. Date: 10/06/2021               Patient Name:  Kevin [name] MRN: [id number]  DOB: 1941/04/27 Age / Sex: 80 y.o., male   PCP: [name], PA-C         Medical Service: Internal Medicine Teaching Service         Attending Physician: Dr. Langston Masker Carola Rhine, MD    First Contact: Scarlett Presto, MD Pager: AD [phone]  Second Contact: Sanjuan Dame, MD Pager: PA [phone]       After Hours (After 5p/  First Contact Pager: [phone]  weekends / holidays): Second Contact Pager: [phone]   SUBJECTIVE   Chief Complaint: Shortness of breath  History of Present Illness:   Kevin [name] is a 80 y.o. M with pertinent PMH of atrial fibrillation s/p PPM, MI, stage IIB melanoma, congestive HF, HTN, T2DM, who presented with shortness of breath and weakness that started last Friday the 18th. He has been around several family members that have had the flu. His daughter was at bedside to provide history and says that she has been trying to convince him to come to the hospital for several days but today he finally agreed. She knew he was really sick because he stopped smoking cigarettes this week. He has not had any subjective fevers but does endorse chills, general malaise, and a productive cough that makes more white mucous than he normally has at baseline. He also fell once this week while trying to get dressed. Nobody in the family saw it happen but heard him yell as he fell, so they think he lost his balance or tripped on something rather than passing out. However, he has had several episodes of dizziness upon quickly standing up or turning. This has been going on for several years. He does not use oxygen at baseline.   Meds:  Current Meds  Medication Sig   atorvastatin (LIPITOR) 80 MG tablet Take 80 mg by mouth at bedtime.   budesonide-formoterol (SYMBICORT) 80-4.5 MCG/ACT inhaler Inhale 2 puffs into the lungs 2 (two) times daily.   Ferrous Fumarate (HEMOCYTE - 106 MG FE) 324 (106 Fe) MG  TABS tablet Take 1 tablet by mouth.   furosemide (LASIX) 40 MG tablet Take 40 mg by mouth daily.   magnesium oxide (MAG-OX) 400 (241.3 Mg) MG tablet Take 400 mg by mouth 2 (two) times daily.   metFORMIN (GLUCOPHAGE) 1000 MG tablet Take 1,000 mg by mouth 2 (two) times daily.   pioglitazone (ACTOS) 30 MG tablet Take 30 mg by mouth daily.   spironolactone (ALDACTONE) 25 MG tablet Take 25 mg by mouth daily.    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  Family History:  Family History  Problem Relation Age of Onset   Diabetes Mellitus II Father    Breast cancer Mother    Ovarian cancer Sister    Cancer Brother    Cancer Maternal Uncle    Breast cancer Niece    Cancer Brother     Allergies: Allergies as of 10/06/2021   (No Known Allergies)    Review of Systems: A complete ROS was negative except as per HPI.   OBJECTIVE:   Physical Exam: Blood pressure 113/77, pulse (!) 107, temperature 98.7 F (37.1 C), temperature source Oral, resp. rate (!) 22, height 5\' 10"  (1.778 m), weight 76.2 kg, SpO2 90 %.  Constitutional: elderly gentleman resting comfortably in bed,  in no acute distress Eyes: conjunctiva non-erythematous Cardiovascular: irregularly irregular rhythm, no m/r/g Pulmonary/Chest: normal work of breathing on 2L, diffuse expiratory wheezing Abdominal: soft, non-distended, mild epigastric tenderness MSK: normal bulk and tone Neurological: alert & oriented x 3, answering questions appropriately Skin: warm and dry Psych: normal affect  Labs: CBC    Component Value Date/Time   WBC 6.1 10/06/2021 1614   RBC 3.72 (L) 10/06/2021 1614   HGB 11.5 (L) 10/06/2021 1614   HGB 11.2 (L) 09/20/2020 1441   HCT 35.9 (L) 10/06/2021 1614   PLT 199 10/06/2021 1614   PLT 183 09/20/2020 1441   MCV 96.5 10/06/2021 1614   MCV 95 (A) 07/16/2021 0000   MCH 30.9 10/06/2021 1614   MCHC 32.0 10/06/2021 1614   RDW 14.4 10/06/2021 1614   LYMPHSABS 0.3 (L) 10/06/2021 1614   MONOABS 0.4 10/06/2021 1614   EOSABS 0.0 10/06/2021 1614   BASOSABS 0.0 10/06/2021 1614     CMP     Component Value Date/Time   NA 132 (L) 10/06/2021 1614   NA 136 (A) 09/20/2021 0000   K 4.4 10/06/2021 1614   CL 96 (L) 10/06/2021 1614   CO2 22 10/06/2021 1614   GLUCOSE 206 (H) 10/06/2021 1614   BUN 40 (H) 10/06/2021 1614   BUN 19 09/20/2021 0000   CREATININE 1.29 (H) 10/06/2021 1614   CREATININE 1.17 09/20/2020 1441   CALCIUM 9.0 10/06/2021 1614   PROT 6.7 10/06/2021 1614   ALBUMIN 3.3 (L) 10/06/2021 1614   AST 104 (H) 10/06/2021 1614   AST 15 09/20/2020 1441   ALT 79 (H) 10/06/2021 1614   ALT 13 09/20/2020 1441   ALKPHOS 104 10/06/2021 1614   BILITOT 0.7 10/06/2021 1614   BILITOT 0.6 09/20/2020 1441   GFRNONAA 56 (L) 10/06/2021 1614   GFRNONAA >60 09/20/2020 1441   GFRAA >60 04/22/2018 0645    Imaging: DG Chest 2 View  Result Date: 10/06/2021 CLINICAL DATA:  Shortness of breath with cough and congestion for several days. EXAM: CHEST - 2 VIEW COMPARISON:  Radiographs 01/04/2018.  CT 04/30/2021. FINDINGS: Right IJ Port-A-Cath appears unchanged, extending to the level  of the mid SVC. Left subclavian pacemaker leads appear unchanged. Mildly progressive cardiomegaly from 2019. The pulmonary vascularity appears normal, and there is no edema, confluent airspace opacity, pleural effusion or pneumothorax. No acute osseous findings are evident. Postsurgical changes are present at the right shoulder with a chronically fractured cerclage wire. IMPRESSION: Mildly progressive cardiomegaly without evidence of acute cardiopulmonary process. Electronically Signed   By: Richardean Sale M.D.   On: 10/06/2021 16:35    EKG: personally reviewed my interpretation is atrial fibrillation, LBBB  ASSESSMENT & PLAN:    Assessment & Plan by Problem: Active Problems:   * No active hospital problems. *   Kevin [name] is a 80 y.o. with pertinent PMH of atrial fibrillation, HFrEF s/p AICD, MI,  stage IIB melanoma,  HTN, T2DM, who presented with shortness of breath and was found to be flu positive.  #Acute on chronic respiratory failure 2/2 influenza A #COPD Patient tested positive for influenza A, outside the window for tamiflu. He is vaccinated so overall illness may be less severe but given his underlying history of lung disease he is more susceptible to developing complications. Additionally, given he is lightheaded and has been requiring oxygen he warrants admission. CXR did not show any consolidations so will hold off on antibiotics at this time, low suspicion for superimposed bacterial infection.  Will continue to treat for a COPD exacerbation.  - start prednisone 40 daily  - continue ventolin, dulera, proventil - guaifenesin for mucous  - s/p 1 dose of lasix 40, holding lasix for now - wean O2 as able  - PT/OT eval and treat  #Atrial tachycardia #Possible new atrial fibrillation Patient has a questionable history of atrial fibrillation. Chart review shows a documented history of atrial tachycardia but family says that he has been told he has an irregular heart rate  before, however he is not on anticoagulation. He has been on amiodarone in the past but this was discontinued at some point. Given he is currently running in the 110-120s will start cardizem - cardizem load - CHA2DS2-VASc Score =   6, HASBLED 3 - discuss risk/benefits with family  #AKI Creatinine baseline appears to be about ~ 1.0, creatinine is elevated to 1.29 suggesting patient may have a borderline AKI. Likely secondary to poor PO intake. - avoid nephrotoxic medications - daily bmp - encourage PO intake.  #Ischemic cardiomyopathy; HFrEF No echo on file done at [facility], last echo per chart review was in 2018 showing severe diffuse hypokinesis with mild concentric hypertrophy and markedly depressed ejection fraction of 20 to 25%, associated with moderate mitral regurgitation and moderate left atrial dilatation. Also coronary angioplasty is listed but there is no information in the chart. Daughter who is also sick with the flu at home knows more than the daughter who accompanied him to the hospital today. Will get more information his cardiac history tomorrow. - clarify cardiac history with daughter. - f/u echo - troponins flat running ~50.  #Melanoma stage IIB Patient has monthly nivolumab infusions.  - CTM  #T2DM -holding metformin - f/u A1c - SSI  #HTN - holding spironolactone 2/2 low/normal blood pressures  #Elevated LFTs No documented history of liver disease. AST 104, ALT 79 - daily CMP  #Anemia - daily cbc - transfuse <7  #Hyponatremia - daily bmp  #HLD - Continue home lipitor   Diet: Heart Healthy VTE: Enoxaparin IVF: None,None Code: DNR  Prior to Admission Living Arrangement: Home, living with family Anticipated Discharge Location: Home Barriers to Discharge: Medical treatment  Dispo: Admit patient to Inpatient with expected length of stay greater than 2 midnights.  Signed: Scarlett Presto, MD Internal Medicine Resident PGY-1 Pager:  [phone]  10/06/2021, 6:01 PM

## 2021-10-06 NOTE — ED Notes (Addendum)
Daughter, [name], would like updates on pt's status and changes please.

## 2021-10-06 NOTE — ED Notes (Signed)
Rounding Provider at bedside.

## 2021-10-06 NOTE — ED Notes (Signed)
BNP Lab to be added on.

## 2021-10-07 ENCOUNTER — Inpatient Hospital Stay (HOSPITAL_COMMUNITY): Payer: Medicare PPO

## 2021-10-07 ENCOUNTER — Other Ambulatory Visit: Payer: Self-pay

## 2021-10-07 DIAGNOSIS — R0609 Other forms of dyspnea: Secondary | ICD-10-CM | POA: Diagnosis not present

## 2021-10-07 DIAGNOSIS — J101 Influenza due to other identified influenza virus with other respiratory manifestations: Secondary | ICD-10-CM | POA: Diagnosis not present

## 2021-10-07 LAB — COMPREHENSIVE METABOLIC PANEL
ALT: 70 U/L — ABNORMAL HIGH (ref 0–44)
AST: 76 U/L — ABNORMAL HIGH (ref 15–41)
Albumin: 3.1 g/dL — ABNORMAL LOW (ref 3.5–5.0)
Alkaline Phosphatase: 92 U/L (ref 38–126)
Anion gap: 11 (ref 5–15)
BUN: 43 mg/dL — ABNORMAL HIGH (ref 8–23)
CO2: 22 mmol/L (ref 22–32)
Calcium: 8.5 mg/dL — ABNORMAL LOW (ref 8.9–10.3)
Chloride: 95 mmol/L — ABNORMAL LOW (ref 98–111)
Creatinine, Ser: 1.16 mg/dL (ref 0.61–1.24)
GFR, Estimated: >60 mL/min (ref >60)
Glucose, Bld: 223 mg/dL — ABNORMAL HIGH (ref 70–99)
Potassium: 4.2 mmol/L (ref 3.5–5.1)
Sodium: 128 mmol/L — ABNORMAL LOW (ref 135–145)
Total Bilirubin: 0.9 mg/dL (ref 0.3–1.2)
Total Protein: 6.2 g/dL — ABNORMAL LOW (ref 6.5–8.1)

## 2021-10-07 LAB — ECHOCARDIOGRAM COMPLETE
Height: 68 in
MV M vel: 3.4 m/s
MV Peak grad: 46.2 mmHg
P 1/2 time: 434 msec
Radius: 0.45 cm
S' Lateral: 5.9 cm
Single Plane A2C EF: 14.3 %
Weight: 2440.93 oz

## 2021-10-07 LAB — HEMOGLOBIN A1C
Hgb A1c MFr Bld: 6.7 % — ABNORMAL HIGH (ref 4.8–5.6)
Mean Plasma Glucose: 146 mg/dL

## 2021-10-07 LAB — CBC
HCT: 34.1 % — ABNORMAL LOW (ref 39.0–52.0)
Hemoglobin: 10.8 g/dL — ABNORMAL LOW (ref 13.0–17.0)
MCH: 30.7 pg (ref 26.0–34.0)
MCHC: 31.7 g/dL (ref 30.0–36.0)
MCV: 96.9 fL (ref 80.0–100.0)
Platelets: 180 10*3/uL (ref 150–400)
RBC: 3.52 MIL/uL — ABNORMAL LOW (ref 4.22–5.81)
RDW: 14.4 % (ref 11.5–15.5)
WBC: 7 10*3/uL (ref 4.0–10.5)
nRBC: 0 % (ref 0.0–0.2)

## 2021-10-07 LAB — GLUCOSE, CAPILLARY
Glucose-Capillary: 214 mg/dL — ABNORMAL HIGH (ref 70–99)
Glucose-Capillary: 225 mg/dL — ABNORMAL HIGH (ref 70–99)
Glucose-Capillary: 200 mg/dL — ABNORMAL HIGH (ref 70–99)
Glucose-Capillary: 152 mg/dL — ABNORMAL HIGH (ref 70–99)

## 2021-10-07 LAB — MAGNESIUM: Magnesium: 1.9 mg/dL (ref 1.7–2.4)

## 2021-10-07 MED ORDER — INSULIN ASPART 100 UNIT/ML IJ SOLN: 0.0000 [IU] | Freq: Every day | INTRAMUSCULAR | Status: DC

## 2021-10-07 MED ORDER — INSULIN ASPART 100 UNIT/ML IJ SOLN
0.0000 [IU] | Freq: Three times a day (TID) | INTRAMUSCULAR | Status: DC
  Administered 2021-10-07: 18:00:00 4 [IU] via SUBCUTANEOUS
  Administered 2021-10-08: 3 [IU] via SUBCUTANEOUS
  Administered 2021-10-08: 20 [IU] via SUBCUTANEOUS
  Administered 2021-10-08 – 2021-10-09 (×2): 4 [IU] via SUBCUTANEOUS
  Administered 2021-10-09: 15 [IU] via SUBCUTANEOUS
  Administered 2021-10-10 (×2): 4 [IU] via SUBCUTANEOUS
  Administered 2021-10-10: 15 [IU] via SUBCUTANEOUS
  Administered 2021-10-11: 4 [IU] via SUBCUTANEOUS
  Administered 2021-10-11: 15 [IU] via SUBCUTANEOUS

## 2021-10-07 MED ORDER — OSELTAMIVIR PHOSPHATE 30 MG PO CAPS
30.0000 mg | ORAL_CAPSULE | Freq: Two times a day (BID) | ORAL | Status: DC
  Administered 2021-10-07 – 2021-10-11 (×9): 30 mg via ORAL
  Filled 2021-10-07 (×10): qty 1

## 2021-10-07 MED ORDER — DILTIAZEM HCL 60 MG PO TABS
60.0000 mg | ORAL_TABLET | Freq: Two times a day (BID) | ORAL | Status: DC
  Administered 2021-10-07: 12:00:00 60 mg via ORAL
  Filled 2021-10-07: qty 1

## 2021-10-07 MED ORDER — METOPROLOL TARTRATE 50 MG PO TABS
50.0000 mg | ORAL_TABLET | Freq: Two times a day (BID) | ORAL | Status: DC
Start: 2021-10-07 — End: 2021-10-08
  Administered 2021-10-07: 21:00:00 50 mg via ORAL
  Filled 2021-10-07 (×3): qty 1

## 2021-10-07 MED ORDER — ALBUTEROL SULFATE (2.5 MG/3ML) 0.083% IN NEBU: 2.5000 mg | INHALATION_SOLUTION | RESPIRATORY_TRACT | Status: DC | PRN

## 2021-10-07 NOTE — Progress Notes (Signed)
HD#1 Subjective:  Overnight Events: NAEO   Patient was working with OT this morning. He is feeling pretty ok, not lightheaded today. Able to eat breakfast this morning.  Objective:  Vital signs in last 24 hours: Vitals:   10/06/21 2200 10/07/21 0054 10/07/21 0207 10/07/21 0425  BP: 106/67 119/79  (!) 101/59  Pulse:  97  73  Resp:  20  20  Temp:    98.8 F (37.1 C)  TempSrc:    Oral  SpO2:  94% 91% 100%  Weight:      Height:       Supplemental O2: Nasal Cannula SpO2: 100 % O2 Flow Rate (L/min): 3 L/min   Physical Exam:  Constitutional: elderly gentleman sitting on the side of the bed in NAS Eyes: conjunctiva non-erythematous Cardiovascular: irregularly irregular rhythm, no m/r/g Pulmonary/Chest: normal work of breathing on 3 L [place] Abdominal: soft, non-distended, mild epigastric tenderness MSK: normal bulk and tone Neurological: alert & oriented x 3, answering questions appropriately Skin: warm and dry Psych: normal affect  Filed Weights   10/06/21 1558 10/06/21 2019  Weight: 76.2 kg 69.2 kg     Intake/Output Summary (Last 24 hours) at 10/07/2021 0706 Last data filed at 10/07/2021 0400 Gross per 24 hour  Intake 299.37 ml  Output 500 ml  Net -200.63 ml   Net IO Since Admission: -200.63 mL [10/07/21 0706]  Pertinent Labs: CBC Latest Ref Rng & Units 10/07/2021 10/06/2021 09/20/2021  WBC 4.0 - 10.5 K/uL 7.0 6.1 4.6  Hemoglobin 13.0 - 17.0 g/dL 10.8(L) 11.5(L) 12.7(A)  Hematocrit 39.0 - 52.0 % 34.1(L) 35.9(L) 38(A)  Platelets 150 - 400 K/uL 180 199 196    CMP Latest Ref Rng & Units 10/07/2021 10/06/2021 09/20/2021  Glucose 70 - 99 mg/dL 223(H) 206(H) -  BUN 8 - 23 mg/dL 43(H) 40(H) 19  Creatinine 0.61 - 1.24 mg/dL 1.16 1.29(H) 1.1  Sodium 135 - 145 mmol/L 128(L) 132(L) 136(A)  Potassium 3.5 - 5.1 mmol/L 4.2 4.4 4.2  Chloride 98 - 111 mmol/L 95(L) 96(L) 101  CO2 22 - 32 mmol/L 22 22 26(A)  Calcium 8.9 - 10.3 mg/dL 8.5(L) 9.0 9.0  Total Protein 6.5 -  8.1 g/dL 6.2(L) 6.7 -  Total Bilirubin 0.3 - 1.2 mg/dL 0.9 0.7 -  Alkaline Phos 38 - 126 U/L 92 104 51  AST 15 - 41 U/L 76(H) 104(H) 24  ALT 0 - 44 U/L 70(H) 79(H) 16    Imaging: DG Chest 2 View  Result Date: 10/06/2021 CLINICAL DATA:  Shortness of breath with cough and congestion for several days. EXAM: CHEST - 2 VIEW COMPARISON:  Radiographs 01/04/2018.  CT 04/30/2021. FINDINGS: Right IJ Port-A-Cath appears unchanged, extending to the level of the mid SVC. Left subclavian pacemaker leads appear unchanged. Mildly progressive cardiomegaly from 2019. The pulmonary vascularity appears normal, and there is no edema, confluent airspace opacity, pleural effusion or pneumothorax. No acute osseous findings are evident. Postsurgical changes are present at the right shoulder with a chronically fractured cerclage wire. IMPRESSION: Mildly progressive cardiomegaly without evidence of acute cardiopulmonary process. Electronically Signed   By: Richardean Sale M.D.   On: 10/06/2021 16:35    Assessment/Plan:   Principal Problem:   Influenza due to influenza A virus   Patient Summary: Kevin [name] is a 80 y.o. with pertinent PMH of atrial fibrillation, HFrEF s/p AICD, MI, stage IIB melanoma,  HTN, T2DM, who presented with shortness of breath and was found to be flu positive.   #  Acute on chronic respiratory failure 2/2 influenza A #COPD Patient is doing well this morning working with PT/OT. Saturating well at rest but may be desatting with exertion. Will continue to treat for a COPD exacerbation in the setting of flu. - start 5 day course tamiflu end 12/2 - continue prednisone 40 daily  - continue ventolin, dulera, proventil - guaifenesin for mucous  - s/p 1 dose of lasix 40, holding lasix for now - wean O2 as able  - PT/OT eval and treat   #Hx of Atrial tachycardia #Possible new atrial fibrillation Patient has a questionable history of atrial fibrillation. Chart review shows a documented  history of atrial tachycardia but family says that he has been told he has an irregular heart rate before, however he is not on anticoagulation. He has been on amiodarone in the past but this was discontinued at some point. Bps have tolerated the diltiazem drip. Will transition today to oral cardizem - cardizem load - CHA2DS2-VASc Score =   6, HASBLED 3 - discuss risk/benefits of anticoagulation with family   #AKI; improving Creatinine baseline appears to be about ~ 1.0, creatinine was elevated to 1.29 suggesting patient may have a borderline AKI. Likely secondary to poor PO intake. This has improved to 1.16 today. - avoid nephrotoxic medications - daily bmp - encourage PO intake.   #Ischemic cardiomyopathy; HFrEF No echo on file done at [facility], last echo per chart review was in 2018 showing severe diffuse hypokinesis with mild concentric hypertrophy and markedly depressed ejection fraction of 20 to 25%, associated with moderate mitral regurgitation and moderate left atrial dilatation. Also coronary angioplasty is listed but there is no information in the chart. Daughter who is also sick with the flu at home knows more than the daughter who accompanied him to the hospital today. Will get more information his cardiac history tomorrow. - clarify cardiac history with daughter. - f/u echo   #Melanoma stage IIB Patient has monthly nivolumab infusions.  - CTM   #T2DM -holding metformin - f/u A1c - SSI   #HTN - holding spironolactone 2/2 low/normal blood pressures   #Elevated LFTs; improving No documented history of liver disease. AST 104, ALT 79 - daily CMP   #Anemia - daily cbc - transfuse <7   #Hyponatremia - daily bmp   #HLD - Continue home lipitor   Diet: Heart Healthy VTE: Enoxaparin IVF: None,None Code: DNR  Scarlett Presto, MD Internal Medicine Resident PGY-1 Pager [phone] Please contact the on call pager after 5 pm and on weekends at [phone].

## 2021-10-07 NOTE — Progress Notes (Signed)
  Echocardiogram 2D Echocardiogram has been performed.  Kevin [name] 10/07/2021, 11:56 AM

## 2021-10-07 NOTE — Evaluation (Signed)
Occupational Therapy Evaluation Patient Details Name: Kevin [name] MRN: [id number] DOB: 1941/07/23 Today's Date: 10/07/2021   History of Present Illness Kevin [name] is a 80 y.o. male who presented with shortness of breath and weakness that started 11/18. Pt also with one fall at home prior to admission. Pt tested positive for influenza A. PMH:  atrial fibrillation s/p PPM, MI, stage IIB melanoma, congestive HF, HTN, T2DM   Clinical Impression   PTA, pt lives with daughter and reports typically Independent with ADLs/mobility though occasionally will use a cane. Pt presents now with deficits in cardiopulmonary tolerance, strength, dynamic standing balance and overall endurance. Pt able to mobilize short distances without AD at min guard but with fatigue, reaches out for support from IV pole. Encouraged trial of RW to maximize safety with mobility though pt hesitant to become reliant on DME. Pt requires Setup for UB ADLs and Min A for LB ADLs due to deficits. Pt able to demo automatic implementation of energy conservation strategies but would benefit from reinforcement. Recommend HHOT follow-up at DC though pt could progress to no OT needed. Will continue to follow acutely.  SpO2 100% on 5 L, titrated to 3 L O2 with 100% at rest. Desats to 88% on 3 L O2 during activity HR a fib, 80sbpm BP after mobility: 123/64 BP up in chair for breathing treatment (RT present): 96/72     Recommendations for follow up therapy are one component of a multi-disciplinary discharge planning process, led by the attending physician.  Recommendations may be updated based on patient status, additional functional criteria and insurance authorization.   Follow Up Recommendations  Home health OT    Assistance Recommended at Discharge Intermittent Supervision/Assistance  Functional Status Assessment  Patient has had a recent decline in their functional status and demonstrates the ability to make significant  improvements in function in a reasonable and predictable amount of time.  Equipment Recommendations  BSC/3in1;Other (comment) (Rolling walker)    Recommendations for Other Services       Precautions / Restrictions Precautions Precautions: Fall;Other (comment) Precaution Comments: monitor vitals; does not wear O2 at baseline Restrictions Weight Bearing Restrictions: No      Mobility Bed Mobility Overal bed mobility: Needs Assistance Bed Mobility: Supine to Sit     Supine to sit: Supervision;HOB elevated          Transfers Overall transfer level: Needs assistance Equipment used: None Transfers: Sit to/from Stand Sit to Stand: Min guard           General transfer comment: min guard for safety, time to gain balance though no use of AD      Balance Overall balance assessment: Needs assistance Sitting-balance support: No upper extremity supported;Feet supported Sitting balance-Leahy Scale: Good     Standing balance support: No upper extremity supported;During functional activity Standing balance-Leahy Scale: Fair Standing balance comment: fair static standing at sink. With increased activity, pt reaching out to IV pole for support                           ADL either performed or assessed with clinical judgement   ADL Overall ADL's : Needs assistance/impaired Eating/Feeding: Set up;Sitting Eating/Feeding Details (indicate cue type and reason): assist with managing small packaging for coffee Grooming: Min guard;Standing;Brushing hair;Wash/dry face Grooming Details (indicate cue type and reason): standing at sink, fatigues quickly Upper Body Bathing: Set up;Sitting   Lower Body Bathing: Minimal assistance;Sit to/from stand  Upper Body Dressing : Set up;Sitting   Lower Body Dressing: Minimal assistance;Sit to/from stand Lower Body Dressing Details (indicate cue type and reason): assist to don socks, reports not wearing socks at home` Toilet  Transfer: Min guard;Ambulation   Toileting- Clothing Manipulation and Hygiene: Minimal assistance;Sit to/from stand       Functional mobility during ADLs: Min guard General ADL Comments: Pt with quick fatigue in standing, desats with supplemental O2 during activity     Vision Baseline Vision/History: 1 Wears glasses Ability to See in Adequate Light: 0 Adequate Patient Visual Report: No change from baseline Vision Assessment?: No apparent visual deficits     Perception     Praxis      Pertinent Vitals/Pain Pain Assessment: No/denies pain     Hand Dominance Right   Extremity/Trunk Assessment Upper Extremity Assessment Upper Extremity Assessment: Generalized weakness   Lower Extremity Assessment Lower Extremity Assessment: Defer to PT evaluation   Cervical / Trunk Assessment Cervical / Trunk Assessment: Normal   Communication Communication Communication: No difficulties   Cognition Arousal/Alertness: Awake/alert Behavior During Therapy: WFL for tasks assessed/performed Overall Cognitive Status: Within Functional Limits for tasks assessed                                 General Comments: likely at baseline cognition, some cues for safety/awareness of deficits; able to monitor SOB well     General Comments  MD team entering during session, as well as RT. SpO2 88% on 3 L o2. 100% at rest. HR a fib in 80s    Exercises     Shoulder Instructions      Home Living Family/patient expects to be discharged to:: Private residence Living Arrangements: Children Available Help at Discharge: Family;Available PRN/intermittently Type of Home: House Home Access: Stairs to enter [employer] of Steps: 3 Entrance Stairs-Rails: Right;Left Home Layout: One level     Bathroom Shower/Tub: Teacher, early years/pre: Standard     Home Equipment: Cane - single point   Additional Comments: daughter works night shift. another daughter lives  nearby and works second shift      Prior Functioning/Environment Prior Level of Function : Independent/Modified Independent             Mobility Comments: intermittent use of cane intermittently; endorses 3 falls this year; hx of dizziness when turning too fast, etc ADLs Comments: Independent with ADLs (likes tub soaking), assists with IADLs some        OT Problem List: Decreased strength;Decreased activity tolerance;Impaired balance (sitting and/or standing);Cardiopulmonary status limiting activity;Decreased knowledge of use of DME or AE      OT Treatment/Interventions: Self-care/ADL training;Therapeutic exercise;Energy conservation;DME and/or AE instruction;Therapeutic activities;Patient/family education;Balance training    OT Goals(Current goals can be found in the care plan section) Acute Rehab OT Goals Patient Stated Goal: decrease O2 need, return home when ready OT Goal Formulation: With patient Time For Goal Achievement: 10/21/21 Potential to Achieve Goals: Good  OT Frequency: Min 2X/week   Barriers to D/C:            Co-evaluation              AM-PAC OT "6 Clicks" Daily Activity     Outcome Measure Help from another person eating meals?: A Little Help from another person taking care of personal grooming?: A Little Help from another person toileting, which includes using toliet, bedpan, or urinal?: A Little Help  from another person bathing (including washing, rinsing, drying)?: A Little Help from another person to put on and taking off regular upper body clothing?: A Little Help from another person to put on and taking off regular lower body clothing?: A Little 6 Click Score: 18   End of Session Equipment Utilized During Treatment: Oxygen Nurse Communication: Other (comment) (NT - O2, BP reading and assist level)  Activity Tolerance: Patient tolerated treatment well Patient left: in chair;with call bell/phone within reach;Other (comment) (NT in  room)  OT Visit Diagnosis: Unsteadiness on feet (R26.81);Other abnormalities of gait and mobility (R26.89);Muscle weakness (generalized) (M62.81)                Time: 6301-6010 OT Time Calculation (min): 40 min Charges:  OT General Charges $OT Visit: 1 Visit OT Evaluation $OT Eval Moderate Complexity: 1 Mod OT Treatments $Self Care/Home Management : 8-22 mins $Therapeutic Activity: 8-22 mins  Malachy Chamber, OTR/L Acute Rehab Services Office: [phone]   Layla Maw 10/07/2021, 11:12 AM

## 2021-10-07 NOTE — Plan of Care (Signed)
  Problem: Education: Goal: Knowledge of General Education information will improve Description: Including pain rating scale, medication(s)/side effects and non-pharmacologic comfort measures Outcome: Progressing   Problem: Health Behavior/Discharge Planning: Goal: Ability to manage health-related needs will improve Outcome: Progressing   Problem: Clinical Measurements: Goal: Diagnostic test results will improve Outcome: Progressing   

## 2021-10-07 NOTE — Plan of Care (Signed)

## 2021-10-07 NOTE — Progress Notes (Addendum)
Date: 10/07/2021  Patient name: Kevin [name]  Medical record number: [id number]  Date of birth: [birth year]   I have seen and evaluated Rockie Trudi Ida and discussed their care with the Residency Team.  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  Patient states that over the last 1 and half weeks he has noted progressively worsening shortness of breath and weakness with associated lightheadedness.  He fell once last week while trying to get dressed.  He has several episodes of lightheadedness which occur when he stands up or turns suddenly.  He has a history of sick contact with several family members positive for the flu.  He does have associated chills, general malaise and a productive cough with white mucus.  No chest pain, no palpitations, no syncope, no focal weakness, no tingling or numbness, no headache, no blurry vision, no nausea or vomiting, no diarrhea, no abdominal pain.  Today, patient states that he feels a little better.  His lightheadedness appears to resolved and his shortness of breath is improving.  PMHx, Fam Hx, and/or Soc Hx : As per resident admit note  Vitals:   10/07/21 0855 10/07/21 1154  BP:  93/72  Pulse:  73  Resp:    Temp:  98.2 F (36.8 C)  SpO2: 94% 94%   General: Awake, alert, oriented x3, NAD CVS: Irregularly irregular, normal rate, no murmurs noted Lungs: Scattered wheezes noted bilaterally Abdomen: Soft, nontender, nondistended, normoactive bowel sounds Extremities: No edema noted, nontender to palpation Psych: Normal and affect HEENT: Normocephalic, atraumatic Skin: Hyperpigmented patches noted over  bilateral upper extremities and hypoactive pigmented macules noted over back  Assessment and Plan: I have seen and evaluated the patient as outlined above. I agree with the formulated Assessment and Plan as detailed in the residents' note, with the following changes:   1.  Acute on chronic hypoxic respiratory failure secondary to influenza: -Patient presented to the ED with progressively worsening shortness of breath, lightheadedness, productive cough and chills over the last 1 and half weeks in the setting of history of positive sick contacts with the flu and was found to be flu positive here.  Patient's O2 sats were 90% on room air on admission and patient was noted to be tachypneic in the mid 20s consistent with worsening hypoxic respiratory failure.  Patient also noted to have decreased O2 sats to 88% on 3 L nasal cannula with ambulation by OT. -We will start the patient on Tamiflu to complete a 5-day course -Continue with O2 supplementation via nasal cannula as needed -PT/OT to evaluate patient -Patient also noted to have wheezes on exam and has a history of COPD.  I suspect that he has an associated COPD exacerbation secondary to his underlying flu -We will complete a 5-day course of prednisone 40 mg daily -Continue with current inhalers including scheduled Dulera and Proventil as needed -No further work-up at this time.  We will continue to monitor closely  2.  New onset A. fib: -Patient does have a history of atrial tachycardia noted on his ICD but was found to be tachycardic here with heart rates up to the 150s to 160s and EKG showing an  irregularly irregular rhythm with no discernible P waves consistent with atrial fibrillation. -Patient's heart rate now controlled on Cardizem drip.  We will transition patient to oral metoprolol (50 mg twice daily).  Would avoid calcium channel blocker given reduced EF noted cardiology office visit -Follow-up 2D echo -Patient has a high CHA2DS2-VASc  score of 6 but also has high has bled score of 3.  We will need to discuss the possibility of anticoagulation with patient and his family.  He is at high risk for falls and did fall last week. -Of note, patient did have mild elevated troponins to the 50s on blood work.  I suspect is likely secondary to demand ischemia from his underlying A. fib.  He has no active chest pain and no other evidence of ACS.  No further work-up at this time -He will need outpatient follow-up with the A. fib clinic and with his own cardiologist -No further work-up at this time  Aldine Contes, MD 11/28/20221:13 PM

## 2021-10-07 NOTE — Progress Notes (Signed)
PT Cancellation Note  Patient Details Name: Kevin [name] MRN: [id number] DOB: 02-19-1941   Cancelled Treatment:    Reason Eval/Treat Not Completed: Fatigue/lethargy limiting ability to participate;Medical issues which prohibited therapy.  Pt was attempted but per nsg pt is not able to be seen this AM.  Asked PT to return tomorrow, and will re-attempt then.   Ramond Dial 10/07/2021, 10:15 AM  Mee Hives, PT PhD Acute Rehab Dept. Number: [place] [facility]

## 2021-10-08 DIAGNOSIS — J101 Influenza due to other identified influenza virus with other respiratory manifestations: Secondary | ICD-10-CM | POA: Diagnosis not present

## 2021-10-08 LAB — CBC
HCT: 31.4 % — ABNORMAL LOW (ref 39.0–52.0)
Hemoglobin: 10.3 g/dL — ABNORMAL LOW (ref 13.0–17.0)
MCH: 30.3 pg (ref 26.0–34.0)
MCHC: 32.8 g/dL (ref 30.0–36.0)
MCV: 92.4 fL (ref 80.0–100.0)
Platelets: 207 10*3/uL (ref 150–400)
RBC: 3.4 MIL/uL — ABNORMAL LOW (ref 4.22–5.81)
RDW: 14.1 % (ref 11.5–15.5)
WBC: 8 10*3/uL (ref 4.0–10.5)
nRBC: 0 % (ref 0.0–0.2)

## 2021-10-08 LAB — MAGNESIUM: Magnesium: 2.3 mg/dL (ref 1.7–2.4)

## 2021-10-08 LAB — BASIC METABOLIC PANEL
Anion gap: 10 (ref 5–15)
BUN: 57 mg/dL — ABNORMAL HIGH (ref 8–23)
CO2: 26 mmol/L (ref 22–32)
Calcium: 8.8 mg/dL — ABNORMAL LOW (ref 8.9–10.3)
Chloride: 97 mmol/L — ABNORMAL LOW (ref 98–111)
Creatinine, Ser: 1.21 mg/dL (ref 0.61–1.24)
GFR, Estimated: >60 mL/min (ref >60)
Glucose, Bld: 146 mg/dL — ABNORMAL HIGH (ref 70–99)
Potassium: 4.3 mmol/L (ref 3.5–5.1)
Sodium: 133 mmol/L — ABNORMAL LOW (ref 135–145)

## 2021-10-08 LAB — GLUCOSE, CAPILLARY
Glucose-Capillary: 138 mg/dL — ABNORMAL HIGH (ref 70–99)
Glucose-Capillary: 159 mg/dL — ABNORMAL HIGH (ref 70–99)
Glucose-Capillary: 367 mg/dL — ABNORMAL HIGH (ref 70–99)
Glucose-Capillary: 106 mg/dL — ABNORMAL HIGH (ref 70–99)

## 2021-10-08 MED ORDER — METOPROLOL TARTRATE 25 MG PO TABS
25.0000 mg | ORAL_TABLET | Freq: Two times a day (BID) | ORAL | Status: DC
  Administered 2021-10-08: 25 mg via ORAL

## 2021-10-08 NOTE — Plan of Care (Signed)
  Problem: Education: Goal: Knowledge of General Education information will improve Description: Including pain rating scale, medication(s)/side effects and non-pharmacologic comfort measures Outcome: Progressing   Problem: Clinical Measurements: Goal: Will remain free from infection Outcome: Progressing   Problem: Clinical Measurements: Goal: Diagnostic test results will improve Outcome: Progressing   

## 2021-10-08 NOTE — Consult Note (Addendum)
CARDIOLOGY CONSULT NOTE  Patient ID: Kevin [name] MRN: [id number] DOB/AGE: 07/17/1941 80 y.o.  Admit date: 10/06/2021 Referring Physician: Internal Medicine Reason for Consultation:  HFrEF  HPI:   80 y.o. Caucasian male  with hypertension, type 2 DM, COPD, nicotine dependence, CAD s/p ?MI/PCI (2015), ischemic cardiomyopathy with previously known LVEF 25%, s/p dual chamber ICD (St Jude), h/o atrial tachycardia, melanoma-currently on monthly chemotherapy, admitted with shortness of breath, Influenza A positive.   Patient lives with his daughter Kevin [name].  He receives his cardiac care prior to this at [facility].  He has not seen a cardiologist in over a year.  He is not on any guideline directed medical therapy for congestive heart failure at baseline.  For the last 2 weeks or so, he is Progressively worse with generalized weakness and exertional dyspnea, along with cough.  He was diagnosed with influenza a during this hospitalization.  Echocardiogram showed ejection fraction <20%.  He was also found to have atrial fibrillation, which is new diagnosis for him.   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) ([place])      Past Surgical History:  Procedure Laterality Date   APPENDECTOMY     CARDIAC CATHETERIZATION     CORONARY ANGIOPLASTY     PACEMAKER INSERTION       Family History  Problem Relation Age of Onset   Diabetes Mellitus II Father    Breast cancer Mother    Ovarian cancer Sister    Cancer Brother    Cancer Maternal Uncle    Breast cancer Niece    Cancer Brother      Social  History: Social History   Socioeconomic History   Marital status: Married    Spouse name: Not on file   Number of children: 2   Years of education: Not on file   Highest education level: Not on file  Occupational History   Not on file  Tobacco Use   Smoking status: Every Day    Packs/day: 0.50    Years: 60.00    Pack years: 30.00    Types: Cigarettes   Smokeless tobacco: Never  Substance and Sexual Activity   Alcohol use: Not Currently   Drug use: Not Currently   Sexual activity: Not Currently  Other Topics Concern   Not on file  Social History Narrative   Not on file   Social Determinants of Health   Financial Resource Strain: Low Risk    Difficulty of Paying Living Expenses: Not hard at all  Food Insecurity: No Food Insecurity   Worried About Charity fundraiser in the Last Year: Never true   Ran Out of Food in the Last Year: Never true  Transportation Needs: No Transportation Needs   Lack of Transportation (Medical): No   Lack of Transportation (Non-Medical): No  Physical Activity: Inactive   Days of Exercise per Week: 0 days   Minutes of Exercise per Session: 0 min  Stress: No Stress Concern Present   Feeling of Stress : Not at all  Social Connections: Socially Isolated  Frequency of Communication with Friends and Family: More than three times a week   Frequency of Social Gatherings with Friends and Family: More than three times a week   Attends Religious Services: Never   Marine scientist or Organizations: No   Attends Archivist Meetings: Never   Marital Status: Widowed  Human resources officer Violence: Not At Risk   Fear of Current or Ex-Partner: No   Emotionally Abused: No   Physically Abused: No   Sexually Abused: No     Medications Prior to Admission  Medication Sig Dispense Refill Last Dose   atorvastatin (LIPITOR) 80 MG tablet Take 80 mg by mouth at bedtime.   10/05/2021   budesonide-formoterol (SYMBICORT) 80-4.5 MCG/ACT inhaler Inhale  2 puffs into the lungs 2 (two) times daily.   10/06/2021   Ferrous Fumarate (HEMOCYTE - 106 MG FE) 324 (106 Fe) MG TABS tablet Take 1 tablet by mouth.   Past Week   furosemide (LASIX) 40 MG tablet Take 40 mg by mouth daily.   10/06/2021   magnesium oxide (MAG-OX) 400 (241.3 Mg) MG tablet Take 400 mg by mouth 2 (two) times daily.   10/06/2021   metFORMIN (GLUCOPHAGE) 1000 MG tablet Take 1,000 mg by mouth 2 (two) times daily.   10/06/2021   pioglitazone (ACTOS) 30 MG tablet Take 30 mg by mouth daily.   10/06/2021   spironolactone (ALDACTONE) 25 MG tablet Take 25 mg by mouth daily.   10/06/2021   clotrimazole-betamethasone (LOTRISONE) cream Apply 1 application topically 2 (two) times daily. (Patient not taking: Reported on 10/06/2021) 30 g 0 Completed Course   ondansetron (ZOFRAN) 4 MG tablet Take 1 tablet (4 mg total) by mouth every 4 (four) hours as needed for nausea. (Patient not taking: Reported on 05/07/2021) 90 tablet 3    ondansetron (ZOFRAN-ODT) 4 MG disintegrating tablet Take by mouth at bedtime. (Patient not taking: Reported on 05/07/2021)       Review of Systems  Constitutional: Negative for decreased appetite, malaise/fatigue, weight gain and weight loss.  HENT:  Negative for congestion.   Eyes:  Negative for visual disturbance.  Cardiovascular:  Positive for dyspnea on exertion. Negative for chest pain, leg swelling, palpitations and syncope.  Respiratory:  Positive for cough and shortness of breath.   Endocrine: Negative for cold intolerance.  Hematologic/Lymphatic: Does not bruise/bleed easily.  Skin:  Negative for itching and rash.  Musculoskeletal:  Negative for myalgias.  Gastrointestinal:  Negative for abdominal pain, nausea and vomiting.  Genitourinary:  Negative for dysuria.  Neurological:  Negative for dizziness and weakness.  Psychiatric/Behavioral:  The patient is not nervous/anxious.   All other systems reviewed and are negative.    Physical Exam: Physical  Exam Vitals and nursing note reviewed.  Constitutional:      General: He is not in acute distress.    Appearance: He is well-developed.  HENT:     Head: Normocephalic and atraumatic.  Eyes:     Conjunctiva/sclera: Conjunctivae normal.     Pupils: Pupils are equal, round, and reactive to light.  Neck:     Vascular: JVD present.  Cardiovascular:     Rate and Rhythm: Normal rate and regular rhythm.     Pulses: Decreased pulses.     Heart sounds: No murmur heard. Pulmonary:     Effort: Pulmonary effort is normal.     Breath sounds: Normal breath sounds. No wheezing or rales.  Abdominal:     General: Bowel sounds are normal.  Palpations: Abdomen is soft.     Tenderness: There is no rebound.  Musculoskeletal:        General: No tenderness. Normal range of motion.     Right lower leg: No edema.     Left lower leg: No edema.  Lymphadenopathy:     Cervical: No cervical adenopathy.  Skin:    General: Skin is warm and dry.  Neurological:     Mental Status: He is alert and oriented to person, place, and time.     Cranial Nerves: No cranial nerve deficit.     Labs:   Lab Results  Component Value Date   WBC 8.0 10/08/2021   HGB 10.3 (L) 10/08/2021   HCT 31.4 (L) 10/08/2021   MCV 92.4 10/08/2021   PLT 207 10/08/2021    Recent Labs  Lab 10/07/21 0313 10/08/21 0257  NA 128* 133*  K 4.2 4.3  CL 95* 97*  CO2 22 26  BUN 43* 57*  CREATININE 1.16 1.21  CALCIUM 8.5* 8.8*  PROT 6.2*  --   BILITOT 0.9  --   ALKPHOS 92  --   ALT 70*  --   AST 76*  --   GLUCOSE 223* 146*     Latest Reference Range & Units 10/06/21 16:14 10/06/21 17:31 10/06/21 18:07  B Natriuretic Peptide 0.0 - 100.0 pg/mL  1,252.4 (H)   Troponin I (High Sensitivity) <18 ng/L 59 (H)  55 (H)  (H): Data is abnormally high  BNP (last 3 results) Recent Labs    10/06/21 1731  BNP 1,252.4*    HEMOGLOBIN A1C Lab Results  Component Value Date   HGBA1C 6.7 (H) 10/07/2021   MPG 146 10/07/2021     Cardiac Panel (last 3 results) No results for input(s): CKTOTAL, CKMB, RELINDX in the last 8760 hours.  Invalid input(s): TROPONINHS  No results found for: CKTOTAL, CKMB, CKMBINDEX   TSH Recent Labs    07/16/21 1456 08/15/21 1121 10/06/21 2026  TSH 1.541 2.498 1.736      Radiology: ECHOCARDIOGRAM COMPLETE  Result Date: 10/07/2021    ECHOCARDIOGRAM REPORT   Patient Name:   EYOEL THROGMORTON Date of Exam: 10/07/2021 Medical Rec #:  [id number]          Height:       68.0 in Accession #:    [id number]         Weight:       152.6 lb Date of Birth:  1941/03/28          BSA:          1.822 m Patient Age:    24 years           BP:           93/72 mmHg Patient Gender: M                  HR:           75 bpm. Exam Location:  Inpatient Procedure: 2D Echo Indications:    dyspnea  History:        Patient has no prior history of Echocardiogram examinations.                 Signs/Symptoms:Influenza; Risk Factors:Diabetes and                 Hypertension.  Sonographer:    Johny Chess RDCS Referring Phys: [id number] NISCHAL NARENDRA IMPRESSIONS  1. Left ventricular ejection fraction, by estimation, is <20%. The left  ventricle has severely decreased function. The left ventricle demonstrates global hypokinesis. The left ventricular internal cavity size was moderately dilated. Left ventricular diastolic parameters are indeterminate.  2. Right ventricular systolic function is normal. The right ventricular size is normal. There is moderately elevated pulmonary artery systolic pressure.  3. Left atrial size was mild to moderately dilated.  4. Small to moderate pericardial effusion surrounds heart. Most apparent around RA, measures 13 mm in maximal dimension.  5. Mild mitral valve regurgitation.  6. Tricuspid valve regurgitation is mild to moderate.  7. Aortic valve regurgitation is mild.  8. Aortic dilatation noted. There is mild dilatation of the aortic root, measuring 39 mm.  9. The inferior vena cava is  dilated in size with <50% respiratory variability, suggesting right atrial pressure of 15 mmHg. FINDINGS  Left Ventricle: Left ventricular ejection fraction, by estimation, is <20%. The left ventricle has severely decreased function. The left ventricle demonstrates global hypokinesis. The left ventricular internal cavity size was moderately dilated. There is no left ventricular hypertrophy. Left ventricular diastolic parameters are indeterminate. Right Ventricle: The right ventricular size is normal. Right vetricular wall thickness was not assessed. Right ventricular systolic function is normal. There is moderately elevated pulmonary artery systolic pressure. The tricuspid regurgitant velocity is  3.46 m/s, and with an assumed right atrial pressure of 5 mmHg, the estimated right ventricular systolic pressure is 78.4 mmHg. Left Atrium: Left atrial size was mild to moderately dilated. Right Atrium: Right atrial size was normal in size. Pericardium: Small to moderate pericardial effusion surrounds heart. Most apparent around RA, measures 13 mm in maximal dimension. Mitral Valve: There is mild thickening of the mitral valve leaflet(s). Mild mitral annular calcification. Mild mitral valve regurgitation. Tricuspid Valve: The tricuspid valve is normal in structure. Tricuspid valve regurgitation is mild to moderate. Aortic Valve: Aortic valve regurgitation is mild. Aortic regurgitation PHT measures 434 msec. Pulmonic Valve: The pulmonic valve was normal in structure. Pulmonic valve regurgitation is not visualized. Aorta: Aortic dilatation noted. There is mild dilatation of the aortic root, measuring 39 mm. Venous: The inferior vena cava is dilated in size with less than 50% respiratory variability, suggesting right atrial pressure of 15 mmHg. IAS/Shunts: No atrial level shunt detected by color flow Doppler. Additional Comments: A device lead is visualized.  LEFT VENTRICLE PLAX 2D LVIDd:         6.60 cm LVIDs:         5.90  cm LV PW:         1.10 cm LV IVS:        0.80 cm LVOT diam:     2.20 cm LV SV:         61 LV SV Index:   33 LVOT Area:     3.80 cm  LV Volumes (MOD) LV vol d, MOD A2C: 147.0 ml LV vol s, MOD A2C: 126.0 ml LV SV MOD A2C:     21.0 ml RIGHT VENTRICLE            IVC RV S prime:     9.25 cm/s  IVC diam: 2.00 cm TAPSE (M-mode): 1.2 cm LEFT ATRIUM             Index        RIGHT ATRIUM           Index LA diam:        3.80 cm 2.09 cm/m   RA Area:     22.30 cm LA Vol (A2C):  89.4 ml 49.08 ml/m  RA Volume:   66.00 ml  36.23 ml/m LA Vol (A4C):   63.1 ml 34.64 ml/m LA Biplane Vol: 76.2 ml 41.83 ml/m  AORTIC VALVE LVOT Vmax:   84.60 cm/s LVOT Vmean:  51.400 cm/s LVOT VTI:    0.160 m AI PHT:      434 msec  AORTA Ao Root diam: 3.90 cm Ao Asc diam:  3.70 cm MR Peak grad:    46.2 mmHg    TRICUSPID VALVE MR Mean grad:    31.0 mmHg    TR Peak grad:   47.9 mmHg MR Vmax:         340.00 cm/s  TR Vmax:        346.00 cm/s MR Vmean:        266.0 cm/s MR PISA:         1.27 cm     SHUNTS MR PISA Eff ROA: 15 mm       Systemic VTI:  0.16 m MR PISA Radius:  0.45 cm      Systemic Diam: 2.20 cm MV E velocity: 3.92 cm/s Dorris Carnes MD Electronically signed by Dorris Carnes MD Signature Date/Time: 10/07/2021/2:14:42 PM    Final     Scheduled Meds:  atorvastatin  80 mg Oral QHS   enoxaparin (LOVENOX) injection  40 mg Subcutaneous Q24H   insulin aspart  0-20 Units Subcutaneous TID WC   insulin aspart  0-5 Units Subcutaneous QHS   magnesium oxide  400 mg Oral BID   metoprolol tartrate  25 mg Oral BID   mometasone-formoterol  2 puff Inhalation BID   oseltamivir  30 mg Oral BID   predniSONE  40 mg Oral Q breakfast   Continuous Infusions: PRN Meds:.acetaminophen **OR** acetaminophen, albuterol, guaiFENesin, ondansetron **OR** ondansetron (ZOFRAN) IV, senna-docusate  CARDIAC STUDIES:  EKG 10/07/2021: Afib with ventricular pacing  Echocardiogram 10/07/2021:  1. Left ventricular ejection fraction, by estimation, is <20%. The left   ventricle has severely decreased function. The left ventricle demonstrates  global hypokinesis. The left ventricular internal cavity size was  moderately dilated. Left ventricular diastolic parameters are indeterminate.   2. Right ventricular systolic function is normal. The right ventricular  size is normal. There is moderately elevated pulmonary artery systolic  pressure.   3. Left atrial size was mild to moderately dilated.   4. Small to moderate pericardial effusion surrounds heart. Most apparent  around RA, measures 13 mm in maximal dimension.   5. Mild mitral valve regurgitation.   6. Tricuspid valve regurgitation is mild to moderate.   7. Aortic valve regurgitation is mild.   8. Aortic dilatation noted. There is mild dilatation of the aortic root,  measuring 39 mm.   9. The inferior vena cava is dilated in size with <50% respiratory  variability, suggesting right atrial pressure of 15 mmHg.    Assessment & Recommendations:  80 y.o. Caucasian male  with hypertension, type 2 DM, COPD, nicotine dependence, CAD s/p ?MI/PCI (2015), ischemic cardiomyopathy with previously known LVEF 25%, s/p dual chamber ICD (St Jude), h/o atrial tachycardia, melanoma-currently on monthly chemotherapy, admitted with shortness of breath, Influenza A positive.   Acute hypoxic respiratory failure: Combination of influenza A infection and acute on chronic HFrEF. Management of Influenza as per primary team  Acute on chronic hypoxic respiratory failure: Last known EF 25% in the past. Now <20% with moderately dilated LV. This is likely gradual worsening, rather than acute drop in EF. At baseline, he is not  on any GDMT for HF. Given high risk for loss of follow-up and further worsening of his systolic heart failure, recommend initiating work-up and management for systolic up.  During the hospitalization.  Recommend right and left heart catheterization, coronary angiography, to rule out any obstructive CAD.   We will then start him on guideline directed medical therapy, starting with Entresto, perhaps beta-blocker. Recommend 2 g sodium restriction diet. He would likely benefit from home health for medication management.  Paroxysmal atrial fibrillation: New diagnosis.  Seen on EKG this hospitalization.  We will confirm on his St Jude ICD. CHA2DS2VAsc score 6, annual stroke risk 9% Recommend anticoagulation.  I would recommend starting oral anticoagulation with Eliquis after his heart catheterization.   Currently, rate controlled.  I would avoid metoprolol tartrate at this time, and other start succinate after performing that cardiac index is not too low. Discontinue pioglitazone, contraindicated in heart failure. Avoid IV diltiazem given very low EF, can act as negative inotropic.  I discussed with patient and his family regarding importance of longitudinal cardiac care.  They have expressed wishes to come to [facility] for his further cardiac care.  Will arrange cardiac follow-up.    Nigel Mormon, MD Pager: [phone] Office: [phone]

## 2021-10-08 NOTE — Discharge Summary (Signed)
Name: Kevin [name] MRN: [id number] DOB: 10/03/1941 80 y.o. PCP: [name], PA-C  Date of Admission: 10/06/2021  3:52 PM Date of Discharge:   10/11/21 Attending Physician: Axel Filler, *  Discharge Diagnosis: 1. Acute on chronic respiratory failure 2/2 influenza A; COPD 2. New onset atrial fibrillation 3.Acute on Chronic HFrEF 4. Melanoma stage IIB 5. T2DM 6. Anemia 7. Elevated LFTs 10. HTN 11. HLD 12. Hyponatremia  Discharge Medications: Allergies as of 10/11/2021   No Known Allergies      Medication List     STOP taking these medications    furosemide 40 MG tablet Commonly known as: LASIX   ondansetron 4 MG disintegrating tablet Commonly known as: ZOFRAN-ODT   ondansetron 4 MG tablet Commonly known as: ZOFRAN   pioglitazone 30 MG tablet Commonly known as: ACTOS   spironolactone 25 MG tablet Commonly known as: ALDACTONE       TAKE these medications    amiodarone 200 MG tablet Commonly known as: PACERONE Take 1 tablet (200 mg total) by mouth 2 (two) times daily.   apixaban 5 MG Tabs tablet Commonly known as: ELIQUIS Take 1 tablet (5 mg total) by mouth 2 (two) times daily.   atorvastatin 80 MG tablet Commonly known as: LIPITOR Take 80 mg by mouth at bedtime.   budesonide-formoterol 80-4.5 MCG/ACT inhaler Commonly known as: SYMBICORT Inhale 2 puffs into the lungs 2 (two) times daily.   clotrimazole-betamethasone cream Commonly known as: Lotrisone Apply 1 application topically 2 (two) times daily.   digoxin 0.125 MG tablet Commonly known as: LANOXIN Take 1 tablet (0.125 mg total) by mouth daily. Start taking on: October 12, 2021   empagliflozin 10 MG Tabs tablet Commonly known as: Jardiance Take 1 tablet (10 mg total) by mouth daily.   Ferrous Fumarate 324 (106 Fe) MG Tabs tablet Commonly known as: HEMOCYTE - 106 mg FE Take 1 tablet by mouth.   magnesium oxide 400 (241.3 Mg) MG tablet Commonly known as:  MAG-OX Take 400 mg by mouth 2 (two) times daily.   metFORMIN 1000 MG tablet Commonly known as: GLUCOPHAGE Take 1,000 mg by mouth 2 (two) times daily.   metoprolol succinate 25 MG 24 hr tablet Commonly known as: TOPROL-XL Take 0.5 tablets (12.5 mg total) by mouth daily. Start taking on: October 12, 2021   sacubitril-valsartan 24-26 MG Commonly known as: ENTRESTO Take 1 tablet by mouth 2 (two) times daily.   torsemide 20 MG tablet Commonly known as: DEMADEX Take 1 tablet (20 mg total) by mouth daily. Start taking on: October 12, 2021               Durable Medical Equipment  (From admission, onward)           Start     Ordered   10/11/21 1212  For home use only DME oxygen  Once       Question Answer Comment  Length of Need 6 Months   Liters per Minute 2   Frequency Continuous (stationary and portable oxygen unit needed)   Oxygen delivery system Gas      10/11/21 1212            Disposition and follow-up:   Kevin [name] was discharged from [facility] in Stable condition.  At the hospital follow up visit please address:  1.  Acute on chronic respiratory failure 2/2 influenza A; COPD- assess that patient's respiratory status improved back to baseline after steroid course. Recheck pulse  ox with ambulation. Continue encouraging tobacco cessation  2. HFrEF; atrial fibrillation- patient lost to follow up with cardiology previously, found to have a worsened EF of <20. make sure patient follows up with cardiology as directed. Assess patient's Bps and how he is tolerating his GDMT  3.  Labs / imaging needed at time of follow-up: bmp  4.  Pending labs/ test needing follow-up: N/A  Follow-up Appointments:  Follow-up Information     Alethia Berthold, PA-C Follow up on 10/25/2021.   Specialty: Cardiology Why: Appointment at 1:00PM.  Please arrive 15 minutes prior and bring all medications with you. Contact information: [place] [address] [phone]                 Hospital Course by problem list: 1. #Acute on chronic respiratory failure 2/2 influenza A #COPD Patient developed shortness of breath and weakness on Friday the 18th. He has been around several family members that have had the flu. His family finally convinced him to come to the hospital on the 27th. He did not have any fevers but endorse chills, general malaise, and a productive cough that makes more white mucous than he normally has at baseline. He was vaccinated for the flu but did test positive for influenza A on admission. Patient was started on a five day course of prednisone 40 daily and tamiflu. CXR obtained on admission did not show any consolidations and there was low suspicion for any superimposed bacterial infection. Patient does not use oxygen at baseline but was requiring 2-3 L of oxygen while in the hospital. Home ventolin, dulera, proventil were continued as well as guaifenesin for mucous. Patient worked with PT in the hospital and his oxygen was weaned down until he was saturating well on room air, though with exertion he desatted down to the 80s. He was discharged home with oxygen for ambulation.   2. #New onset atrial fibrillation Acute on Chronic HFrEF Patient has a questionable history of atrial fibrillation. Chart review shows a documented history of atrial tachycardia but family says that he has been told he has an irregular heart rate before, however he is not on anticoagulation. No echo on file done at [facility], last echo per chart review was in 2018 showing severe diffuse hypokinesis with mild concentric hypertrophy and markedly depressed ejection fraction of 20 to 25%, associated with moderate mitral regurgitation and moderate left atrial dilatation. Repeat echo showed a worsened EF of <20 with global hypokinesis. CHA2DS2-VASc Score =  6, HASBLED 3. Cardiology was consulted given the worsened EF and newfound  atrial fibrillation. 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 Patient was loaded with amiodarone and digoxin while inpatient. Upon discharge patient was sent home on amiodarone 200 BID, digoxin 0.125 daily, metoprolol succinate 12.5 mg daily, jardiance 10 mg, entresto 24-26 BID, torsemide 20 once daily, and eliquis 5 mg BID. Spironolactone was discontinued while in the hospital due to low/normal Bps, can discuss adding it back on with cardiology as an outpatient. He has cardiology follow up scheduled upon discharge.   3. #Melanoma stage IIB Patient has monthly nivolumab infusions. No intervention required   4. #T2DM Held metformin while inpatient, kept on SSI. A1c found to be 6.7. Will continue metformin upon discharge, discontinued pioglitazone 2/2 HF.    6. #HTN Held spironolactone, started entresto 24-26, and metoprolol succinate 12.5 daily.   7. Elevated LFTs Monitored via CMPs. No intervention  required  8. Anemia Hemoglobin 10-11 during admission. Required no transfusions.    9. Hyponatremia Improved as patient's PO intake improved. Monitored on daily labs.   10. HLD Continued home lipitor  Discharge Subjective Patient evaluated at bedside. States he is feeling well and not short of breath at rest. Continues to have shortness of breath with activity and walking with PT yesterday.   Discharge Exam:   BP (!) 112/54 (BP Location: Right Arm)   Pulse 72   Temp 98.2 F (36.8 C) (Oral)   Resp 19   Ht 5\' 8"  (1.727 m)   Wt 66.5 kg Comment: scale b  SpO2 94%   BMI 22.28 kg/m    Constitutional: elderly gentleman laying in bed in NAD Eyes: conjunctiva non-erythematous Cardiovascular: irregularly irregular rhythm, no m/r/g Pulmonary/Chest: normal work of breathing on room air Abdominal: soft, non-distended, normal bowel sounds MSK: normal bulk and tone Neurological: alert & oriented x 3, answering questions  appropriately Skin: warm and dry Psych: normal affect    Pertinent Labs, Studies, and Procedures:   DG Chest 2 View  Result Date: 10/06/2021 CLINICAL DATA:  Shortness of breath with cough and congestion for several days. EXAM: CHEST - 2 VIEW COMPARISON:  Radiographs 01/04/2018.  CT 04/30/2021. FINDINGS: Right IJ Port-A-Cath appears unchanged, extending to the level of the mid SVC. Left subclavian pacemaker leads appear unchanged. Mildly progressive cardiomegaly from 2019. The pulmonary vascularity appears normal, and there is no edema, confluent airspace opacity, pleural effusion or pneumothorax. No acute osseous findings are evident. Postsurgical changes are present at the right shoulder with a chronically fractured cerclage wire. IMPRESSION: Mildly progressive cardiomegaly without evidence of acute cardiopulmonary process. Electronically Signed   By: Richardean Sale M.D.   On: 10/06/2021 16:35   ECHOCARDIOGRAM COMPLETE  Result Date: 10/07/2021    ECHOCARDIOGRAM REPORT   Patient Name:   RAINEN VANROSSUM Date of Exam: 10/07/2021 Medical Rec #:  [id number]          Height:       68.0 in Accession #:    [id number]         Weight:       152.6 lb Date of Birth:  31-Mar-1941          BSA:          1.822 m Patient Age:    83 years           BP:           93/72 mmHg Patient Gender: M                  HR:           75 bpm. Exam Location:  Inpatient Procedure: 2D Echo Indications:    dyspnea  History:        Patient has no prior history of Echocardiogram examinations.                 Signs/Symptoms:Influenza; Risk Factors:Diabetes and                 Hypertension.  Sonographer:    Johny Chess RDCS Referring Phys: [id number] NISCHAL NARENDRA IMPRESSIONS  1. Left ventricular ejection fraction, by estimation, is <20%. The left ventricle has severely decreased function. The left ventricle demonstrates global hypokinesis. The left ventricular internal cavity size was moderately dilated. Left ventricular  diastolic parameters are indeterminate.  2. Right ventricular systolic function is normal. The right ventricular size is normal. There is moderately elevated  pulmonary artery systolic pressure.  3. Left atrial size was mild to moderately dilated.  4. Small to moderate pericardial effusion surrounds heart. Most apparent around RA, measures 13 mm in maximal dimension.  5. Mild mitral valve regurgitation.  6. Tricuspid valve regurgitation is mild to moderate.  7. Aortic valve regurgitation is mild.  8. Aortic dilatation noted. There is mild dilatation of the aortic root, measuring 39 mm.  9. The inferior vena cava is dilated in size with <50% respiratory variability, suggesting right atrial pressure of 15 mmHg. FINDINGS  Left Ventricle: Left ventricular ejection fraction, by estimation, is <20%. The left ventricle has severely decreased function. The left ventricle demonstrates global hypokinesis. The left ventricular internal cavity size was moderately dilated. There is no left ventricular hypertrophy. Left ventricular diastolic parameters are indeterminate. Right Ventricle: The right ventricular size is normal. Right vetricular wall thickness was not assessed. Right ventricular systolic function is normal. There is moderately elevated pulmonary artery systolic pressure. The tricuspid regurgitant velocity is  3.46 m/s, and with an assumed right atrial pressure of 5 mmHg, the estimated right ventricular systolic pressure is 35.4 mmHg. Left Atrium: Left atrial size was mild to moderately dilated. Right Atrium: Right atrial size was normal in size. Pericardium: Small to moderate pericardial effusion surrounds heart. Most apparent around RA, measures 13 mm in maximal dimension. Mitral Valve: There is mild thickening of the mitral valve leaflet(s). Mild mitral annular calcification. Mild mitral valve regurgitation. Tricuspid Valve: The tricuspid valve is normal in structure. Tricuspid valve regurgitation is mild to  moderate. Aortic Valve: Aortic valve regurgitation is mild. Aortic regurgitation PHT measures 434 msec. Pulmonic Valve: The pulmonic valve was normal in structure. Pulmonic valve regurgitation is not visualized. Aorta: Aortic dilatation noted. There is mild dilatation of the aortic root, measuring 39 mm. Venous: The inferior vena cava is dilated in size with less than 50% respiratory variability, suggesting right atrial pressure of 15 mmHg. IAS/Shunts: No atrial level shunt detected by color flow Doppler. Additional Comments: A device lead is visualized.  LEFT VENTRICLE PLAX 2D LVIDd:         6.60 cm LVIDs:         5.90 cm LV PW:         1.10 cm LV IVS:        0.80 cm LVOT diam:     2.20 cm LV SV:         61 LV SV Index:   33 LVOT Area:     3.80 cm  LV Volumes (MOD) LV vol d, MOD A2C: 147.0 ml LV vol s, MOD A2C: 126.0 ml LV SV MOD A2C:     21.0 ml RIGHT VENTRICLE            IVC RV S prime:     9.25 cm/s  IVC diam: 2.00 cm TAPSE (M-mode): 1.2 cm LEFT ATRIUM             Index        RIGHT ATRIUM           Index LA diam:        3.80 cm 2.09 cm/m   RA Area:     22.30 cm LA Vol (A2C):   89.4 ml 49.08 ml/m  RA Volume:   66.00 ml  36.23 ml/m LA Vol (A4C):   63.1 ml 34.64 ml/m LA Biplane Vol: 76.2 ml 41.83 ml/m  AORTIC VALVE LVOT Vmax:   84.60 cm/s LVOT Vmean:  51.400 cm/s LVOT  VTI:    0.160 m AI PHT:      434 msec  AORTA Ao Root diam: 3.90 cm Ao Asc diam:  3.70 cm MR Peak grad:    46.2 mmHg    TRICUSPID VALVE MR Mean grad:    31.0 mmHg    TR Peak grad:   47.9 mmHg MR Vmax:         340.00 cm/s  TR Vmax:        346.00 cm/s MR Vmean:        266.0 cm/s MR PISA:         1.27 cm     SHUNTS MR PISA Eff ROA: 15 mm       Systemic VTI:  0.16 m MR PISA Radius:  0.45 cm      Systemic Diam: 2.20 cm MV E velocity: 3.92 cm/s Dorris Carnes MD Electronically signed by Dorris Carnes MD Signature Date/Time: 10/07/2021/2:14:42 PM    Final     CBC Latest Ref Rng & Units 10/10/2021 10/09/2021 10/09/2021  WBC 4.0 - 10.5 K/uL 7.8 - -   Hemoglobin 13.0 - 17.0 g/dL 10.6(L) 11.6(L) 11.6(L)  Hematocrit 39.0 - 52.0 % 33.0(L) 34.0(L) 34.0(L)  Platelets 150 - 400 K/uL 256 - -    BMP Latest Ref Rng & Units 10/11/2021 10/11/2021 10/10/2021  Glucose 70 - 99 mg/dL 86 168(H) 190(H)  BUN 8 - 23 mg/dL 47(H) 53(H) 59(H)  Creatinine 0.61 - 1.24 mg/dL 1.21 1.15 1.25(H)  Sodium 135 - 145 mmol/L 139 136 136  Potassium 3.5 - 5.1 mmol/L 4.0 5.7(H) 4.6  Chloride 98 - 111 mmol/L 98 99 98  CO2 22 - 32 mmol/L 35(H) 29 28  Calcium 8.9 - 10.3 mg/dL 8.7(L) 8.6(L) 8.9    Discharge Instructions: Deryl Parchment  You were recently admitted to [facility] for the flu. We gave you some medications to help with your breathing and also to help your heart pump. Your oxygen levels were also low when you were in the hospital. We are sending you home with oxygen to use when you walk around/exert yourself. You can follow with your primary care doctor in the outpatient setting to see if you no longer need to use oxygen.   Continue taking your home medications with the following changes  Start taking jardiance 10 mg daily Start taking metoprolol succinate 12.5 once a day Start taking entresto twice a day Start Taking amiodarone 200 daily Start taking digoxin 0.125 once daily 6.  Start taking torsemide 20 mg  every morning 7.  Start taking eliquis 5 mg twice a day   1. Stop taking spironolactone (aldactone). Talk to your heart doctor in the outpatient setting about whether you should restart this medication 2. Stop taking pioglitazone (actos) 3. Stop taking furosemide (lasix)   You should seek further medical care if you have increasing shortness of breath, mucous production, fevers, cough, dizziness or lightheadedness, increasing leg swelling, or difficulty lying flat  We recommend that you follow up with your primary care doctor in about a week. You should also go see the heart doctor for your appointment on 10/25/2021 at 1:00 PM at [place].  We recommend that you see your primary care doctor in about a week to make sure that you continue to improve. We are so glad that you are feeling better.  Sincerely, Scarlett Presto, MD  Signed: Scarlett Presto, MD 10/11/2021, 1:50 PM   Pager: [phone]

## 2021-10-08 NOTE — Progress Notes (Addendum)
HD#2 Subjective:  Overnight Events: NAEO   Patient says he is still feeling sleepy this morning. He did work with the therapists yesterday and feels like he is getting better. His breathing is not as bad as it was.   Objective:  Vital signs in last 24 hours: Vitals:   10/08/21 0425 10/08/21 0733 10/08/21 0758 10/08/21 1027  BP: 98/62 102/80    Pulse: 74 76    Resp: 20 20    Temp: (!) 97.5 F (36.4 C) 97.9 F (36.6 C)    TempSrc: Oral     SpO2: 94% 94% 94% 92%  Weight: 68.9 kg     Height:       Supplemental O2: Room Air SpO2: 92 % O2 Flow Rate (L/min): 2 L/min   Physical Exam:  Constitutional: elderly gentleman laying in bed in NAD Eyes: conjunctiva non-erythematous Cardiovascular: irregularly irregular rhythm, no m/r/g Pulmonary/Chest: normal work of breathing on 3 L [place], expiratory wheezing Abdominal: soft, non-distended, normal bowel sounds MSK: normal bulk and tone Neurological: alert & oriented x 3, answering questions appropriately Skin: warm and dry Psych: normal affect    Filed Weights   10/06/21 1558 10/06/21 2019 10/08/21 0425  Weight: 76.2 kg 69.2 kg 68.9 kg     Intake/Output Summary (Last 24 hours) at 10/08/2021 1109 Last data filed at 10/08/2021 1013 Gross per 24 hour  Intake 720 ml  Output 400 ml  Net 320 ml   Net IO Since Admission: 399.37 mL [10/08/21 1109]  Pertinent Labs: CBC Latest Ref Rng & Units 10/08/2021 10/07/2021 10/06/2021  WBC 4.0 - 10.5 K/uL 8.0 7.0 6.1  Hemoglobin 13.0 - 17.0 g/dL 10.3(L) 10.8(L) 11.5(L)  Hematocrit 39.0 - 52.0 % 31.4(L) 34.1(L) 35.9(L)  Platelets 150 - 400 K/uL 207 180 199    CMP Latest Ref Rng & Units 10/08/2021 10/07/2021 10/06/2021  Glucose 70 - 99 mg/dL 146(H) 223(H) 206(H)  BUN 8 - 23 mg/dL 57(H) 43(H) 40(H)  Creatinine 0.61 - 1.24 mg/dL 1.21 1.16 1.29(H)  Sodium 135 - 145 mmol/L 133(L) 128(L) 132(L)  Potassium 3.5 - 5.1 mmol/L 4.3 4.2 4.4  Chloride 98 - 111 mmol/L 97(L) 95(L) 96(L)  CO2 22 -  32 mmol/L 26 22 22   Calcium 8.9 - 10.3 mg/dL 8.8(L) 8.5(L) 9.0  Total Protein 6.5 - 8.1 g/dL - 6.2(L) 6.7  Total Bilirubin 0.3 - 1.2 mg/dL - 0.9 0.7  Alkaline Phos 38 - 126 U/L - 92 104  AST 15 - 41 U/L - 76(H) 104(H)  ALT 0 - 44 U/L - 70(H) 79(H)    Imaging: ECHOCARDIOGRAM COMPLETE  Result Date: 10/07/2021    ECHOCARDIOGRAM REPORT   Patient Name:   Kevin [name] Date of Exam: 10/07/2021 Medical Rec #:  [id number]          Height:       68.0 in Accession #:    [id number]         Weight:       152.6 lb Date of Birth:  09-04-1941          BSA:          1.822 m Patient Age:    80 years           BP:           93/72 mmHg Patient Gender: M                  HR:  75 bpm. Exam Location:  Inpatient Procedure: 2D Echo Indications:    dyspnea  History:        Patient has no prior history of Echocardiogram examinations.                 Signs/Symptoms:Influenza; Risk Factors:Diabetes and                 Hypertension.  Sonographer:    Johny Chess RDCS Referring Phys: [id number] NISCHAL NARENDRA IMPRESSIONS  1. Left ventricular ejection fraction, by estimation, is <20%. The left ventricle has severely decreased function. The left ventricle demonstrates global hypokinesis. The left ventricular internal cavity size was moderately dilated. Left ventricular diastolic parameters are indeterminate.  2. Right ventricular systolic function is normal. The right ventricular size is normal. There is moderately elevated pulmonary artery systolic pressure.  3. Left atrial size was mild to moderately dilated.  4. Small to moderate pericardial effusion surrounds heart. Most apparent around RA, measures 13 mm in maximal dimension.  5. Mild mitral valve regurgitation.  6. Tricuspid valve regurgitation is mild to moderate.  7. Aortic valve regurgitation is mild.  8. Aortic dilatation noted. There is mild dilatation of the aortic root, measuring 39 mm.  9. The inferior vena cava is dilated in size with <50% respiratory  variability, suggesting right atrial pressure of 15 mmHg. FINDINGS  Left Ventricle: Left ventricular ejection fraction, by estimation, is <20%. The left ventricle has severely decreased function. The left ventricle demonstrates global hypokinesis. The left ventricular internal cavity size was moderately dilated. There is no left ventricular hypertrophy. Left ventricular diastolic parameters are indeterminate. Right Ventricle: The right ventricular size is normal. Right vetricular wall thickness was not assessed. Right ventricular systolic function is normal. There is moderately elevated pulmonary artery systolic pressure. The tricuspid regurgitant velocity is  3.46 m/s, and with an assumed right atrial pressure of 5 mmHg, the estimated right ventricular systolic pressure is 09.8 mmHg. Left Atrium: Left atrial size was mild to moderately dilated. Right Atrium: Right atrial size was normal in size. Pericardium: Small to moderate pericardial effusion surrounds heart. Most apparent around RA, measures 13 mm in maximal dimension. Mitral Valve: There is mild thickening of the mitral valve leaflet(s). Mild mitral annular calcification. Mild mitral valve regurgitation. Tricuspid Valve: The tricuspid valve is normal in structure. Tricuspid valve regurgitation is mild to moderate. Aortic Valve: Aortic valve regurgitation is mild. Aortic regurgitation PHT measures 434 msec. Pulmonic Valve: The pulmonic valve was normal in structure. Pulmonic valve regurgitation is not visualized. Aorta: Aortic dilatation noted. There is mild dilatation of the aortic root, measuring 39 mm. Venous: The inferior vena cava is dilated in size with less than 50% respiratory variability, suggesting right atrial pressure of 15 mmHg. IAS/Shunts: No atrial level shunt detected by color flow Doppler. Additional Comments: A device lead is visualized.  LEFT VENTRICLE PLAX 2D LVIDd:         6.60 cm LVIDs:         5.90 cm LV PW:         1.10 cm LV IVS:         0.80 cm LVOT diam:     2.20 cm LV SV:         61 LV SV Index:   33 LVOT Area:     3.80 cm  LV Volumes (MOD) LV vol d, MOD A2C: 147.0 ml LV vol s, MOD A2C: 126.0 ml LV SV MOD A2C:     21.0 ml RIGHT  VENTRICLE            IVC RV S prime:     9.25 cm/s  IVC diam: 2.00 cm TAPSE (M-mode): 1.2 cm LEFT ATRIUM             Index        RIGHT ATRIUM           Index LA diam:        3.80 cm 2.09 cm/m   RA Area:     22.30 cm LA Vol (A2C):   89.4 ml 49.08 ml/m  RA Volume:   66.00 ml  36.23 ml/m LA Vol (A4C):   63.1 ml 34.64 ml/m LA Biplane Vol: 76.2 ml 41.83 ml/m  AORTIC VALVE LVOT Vmax:   84.60 cm/s LVOT Vmean:  51.400 cm/s LVOT VTI:    0.160 m AI PHT:      434 msec  AORTA Ao Root diam: 3.90 cm Ao Asc diam:  3.70 cm MR Peak grad:    46.2 mmHg    TRICUSPID VALVE MR Mean grad:    31.0 mmHg    TR Peak grad:   47.9 mmHg MR Vmax:         340.00 cm/s  TR Vmax:        346.00 cm/s MR Vmean:        266.0 cm/s MR PISA:         1.27 cm     SHUNTS MR PISA Eff ROA: 15 mm       Systemic VTI:  0.16 m MR PISA Radius:  0.45 cm      Systemic Diam: 2.20 cm MV E velocity: 3.92 cm/s Dorris Carnes MD Electronically signed by Dorris Carnes MD Signature Date/Time: 10/07/2021/2:14:42 PM    Final     Assessment/Plan:   Principal Problem:   Influenza due to influenza A virus   Patient Summary: Kevin [name] is a 80 y.o. with pertinent PMH of atrial fibrillation, HFrEF s/p AICD, MI, stage IIB melanoma,  HTN, T2DM, who presented with shortness of breath and was found to be flu positive.   #Acute hypoxic failure 2/2 influenza A #COPD Patient is doing well this morning working with PT/OT. Saturating well at rest but may be desatting with exertion. Will continue to treat for a COPD exacerbation in the setting of flu. - continue 5 day course tamiflu end 12/2 - continue prednisone 40 daily  - continue ventolin, dulera, proventil - guaifenesin for mucous  - s/p 1 dose of lasix 40, holding lasix for now - wean O2 as able  - PT/OT eval  and treat   #Hx of Atrial tachycardia #Possible new onset atrial fibrillation Patient has a questionable history of atrial fibrillation. Chart review shows a documented history of atrial tachycardia but family says that he has been told he has an irregular heart rate before, however he is not on anticoagulation. He has been on amiodarone in the past but this was discontinued at some point. Will consult cardiology today. Blood pressures were not tolerating 50 mg of metoprolol daily, will decrease to 25 BID -cardiology consult appreciate assistance - metoprolol 25 BID - CHA2DS2-VASc Score =   6, HASBLED 3 - discuss risk/benefits of anticoagulation with family   #possible AKI  Likely secondary to poor PO intake. Will continue to monitor BMPs  - avoid nephrotoxic medications - daily bmp - encourage PO intake.   #Ischemic cardiomyopathy; HFrEF No echo on file done at [facility], last echo per chart  review was in 2018 showing severe diffuse hypokinesis with mild concentric hypertrophy and markedly depressed ejection fraction of 20 to 25%, associated with moderate mitral regurgitation and moderate left atrial dilatation. Repeat echo showed a worsened EF of <20 with global hypokinesis. Will consult cardiology as above. - metoprolol 25 BID   #Melanoma stage IIB Patient has monthly nivolumab infusions.  - CTM   #T2DM -holding metformin - f/u A1c - SSI   #HTN - holding spironolactone 2/2 low/normal blood pressures   #Elevated LFTs; improving No documented history of liver disease. - daily CMP   #Anemia - daily cbc - transfuse <7   #Hyponatremia - daily bmp   #HLD - Continue home lipitor   Diet: Heart Healthy VTE: Enoxaparin IVF: None,None Code: DNR  Scarlett Presto, MD Internal Medicine Resident PGY-1 Pager [phone] Please contact the on call pager after 5 pm and on weekends at [phone].

## 2021-10-08 NOTE — Evaluation (Signed)
Physical Therapy Evaluation Patient Details Name: Kevin [name] MRN: [id number] DOB: 11/25/1940 Today's Date: 10/08/2021  History of Present Illness  Kevin [name] is a 80 y.o. male who presented with shortness of breath and weakness that started 11/18. Pt also with one fall at home prior to admission. Pt tested positive for influenza A. PMH:  atrial fibrillation s/p PPM, MI, stage IIB melanoma, congestive HF, HTN, T2DM   Clinical Impression  Patient received in bed, agreeable to PT assessment. Patient reports he lives with daughter and sister present in room on eval to confirm. Reports he has someone with him 24/7. Patient does not use AD at baseline, occasional use of SPC. He gets out into community with family but does not drive. He is mod independent with bed mobility and requires min guard for transfers. Patient ambulated 200 feet with RW and 2 liters O2 with O2 saturations ranging from 92-95%. Patient will continue to benefit from skilled PT while here to improve functional independence, safety and strength.          Recommendations for follow up therapy are one component of a multi-disciplinary discharge planning process, led by the attending physician.  Recommendations may be updated based on patient status, additional functional criteria and insurance authorization.  Follow Up Recommendations Home health PT    Assistance Recommended at Discharge Intermittent Supervision/Assistance  Functional Status Assessment Patient has had a recent decline in their functional status and demonstrates the ability to make significant improvements in function in a reasonable and predictable amount of time.  Equipment Recommendations  Rolling walker (2 wheels)    Recommendations for Other Services       Precautions / Restrictions Precautions Precautions: Fall Precaution Comments: monitor vitals; does not wear O2 at baseline Restrictions Weight Bearing Restrictions: No      Mobility   Bed Mobility Overal bed mobility: Modified Independent Bed Mobility: Supine to Sit;Sit to Supine     Supine to sit: Modified independent (Device/Increase time);HOB elevated Sit to supine: Modified independent (Device/Increase time);HOB elevated   General bed mobility comments: no physical assist needed, management of lines needed    Transfers Overall transfer level: Needs assistance Equipment used: Rolling walker (2 wheels) Transfers: Sit to/from Stand Sit to Stand: Min guard                Ambulation/Gait Ambulation/Gait assistance: Min guard Gait Distance (Feet): 200 Feet Assistive device: Rolling walker (2 wheels) Gait Pattern/deviations: Step-through pattern;Decreased step length - right;Decreased step length - left;Decreased stride length;Narrow base of support;Drifts right/left Gait velocity: decr     General Gait Details: some unsteadiness with walker use, sob, O2 sats remained >90% on 2 liters with ambulation  Stairs            Wheelchair Mobility    Modified Rankin (Stroke Patients Only)       Balance Overall balance assessment: Needs assistance Sitting-balance support: Feet supported Sitting balance-Leahy Scale: Good     Standing balance support: Bilateral upper extremity supported;During functional activity;Reliant on assistive device for balance Standing balance-Leahy Scale: Fair                               Pertinent Vitals/Pain Pain Assessment: No/denies pain    Home Living Family/patient expects to be discharged to:: Private residence Living Arrangements: Children Available Help at Discharge: Family;Available PRN/intermittently Type of Home: House Home Access: Stairs to enter Entrance Stairs-Rails: Right;Left Entrance Stairs-Number of Steps:  3   Home Layout: One level Home Equipment: Cane - single point Additional Comments: daughter works night shift. another daughter lives nearby and works second shift    Prior  Function Prior Level of Function : Independent/Modified Independent             Mobility Comments: intermittent use of cane intermittently; endorses 3 falls this year; hx of dizziness when turning too fast, etc ADLs Comments: Independent with ADLs (likes tub soaking), assists with IADLs some     Hand Dominance   Dominant Hand: Right    Extremity/Trunk Assessment   Upper Extremity Assessment Upper Extremity Assessment: Defer to OT evaluation    Lower Extremity Assessment Lower Extremity Assessment: Generalized weakness    Cervical / Trunk Assessment Cervical / Trunk Assessment: Normal  Communication   Communication: No difficulties  Cognition Arousal/Alertness: Awake/alert Behavior During Therapy: WFL for tasks assessed/performed Overall Cognitive Status: Within Functional Limits for tasks assessed                                 General Comments: likely at baseline cognition, some cues for safety/awareness of deficits; able to monitor SOB well        General Comments      Exercises     Assessment/Plan    PT Assessment Patient needs continued PT services  PT Problem List Decreased strength;Decreased mobility;Decreased safety awareness;Decreased balance;Decreased activity tolerance;Decreased knowledge of use of DME       PT Treatment Interventions DME instruction;Therapeutic activities;Gait training;Therapeutic exercise;Patient/family education;Functional mobility training;Stair training    PT Goals (Current goals can be found in the Care Plan section)  Acute Rehab PT Goals Patient Stated Goal: to return home PT Goal Formulation: With patient/family Time For Goal Achievement: 10/22/21 Potential to Achieve Goals: Good    Frequency Min 3X/week   Barriers to discharge        Co-evaluation               AM-PAC PT "6 Clicks" Mobility  Outcome Measure Help needed turning from your back to your side while in a flat bed without using  bedrails?: None Help needed moving from lying on your back to sitting on the side of a flat bed without using bedrails?: None Help needed moving to and from a bed to a chair (including a wheelchair)?: A Little Help needed standing up from a chair using your arms (e.g., wheelchair or bedside chair)?: A Little Help needed to walk in hospital room?: A Little Help needed climbing 3-5 steps with a railing? : A Little 6 Click Score: 20    End of Session Equipment Utilized During Treatment: Gait belt;Oxygen Activity Tolerance: Patient limited by fatigue Patient left: in bed;with call bell/phone within reach;with family/visitor present Nurse Communication: Mobility status PT Visit Diagnosis: Muscle weakness (generalized) (M62.81);Difficulty in walking, not elsewhere classified (R26.2)    Time: 9628-3662 PT Time Calculation (min) (ACUTE ONLY): 22 min   Charges:   PT Evaluation $PT Eval Moderate Complexity: 1 Mod PT Treatments $Gait Training: 8-22 mins        Wisam Siefring, PT, GCS 10/08/21,10:36 AM

## 2021-10-08 NOTE — H&P (View-Only) (Signed)
CARDIOLOGY CONSULT NOTE  Patient ID: Kevin [name] MRN: [id number] DOB/AGE: 02-18-1941 80 y.o.  Admit date: 10/06/2021 Referring Physician: Internal Medicine Reason for Consultation:  HFrEF  HPI:   80 y.o. Caucasian male  with hypertension, type 2 DM, COPD, nicotine dependence, CAD s/p ?MI/PCI (2015), ischemic cardiomyopathy with previously known LVEF 25%, s/p dual chamber ICD (St Jude), h/o atrial tachycardia, melanoma-currently on monthly chemotherapy, admitted with shortness of breath, Influenza A positive.   Patient lives with his daughter Kevin [name].  He receives his cardiac care prior to this at [facility].  He has not seen a cardiologist in over a year.  He is not on any guideline directed medical therapy for congestive heart failure at baseline.  For the last 2 weeks or so, he is Progressively worse with generalized weakness and exertional dyspnea, along with cough.  He was diagnosed with influenza a during this hospitalization.  Echocardiogram showed ejection fraction <20%.  He was also found to have atrial fibrillation, which is new diagnosis for him.   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) ([place])      Past Surgical History:  Procedure Laterality Date   APPENDECTOMY     CARDIAC CATHETERIZATION     CORONARY ANGIOPLASTY     PACEMAKER INSERTION       Family History  Problem Relation Age of Onset   Diabetes Mellitus II Father    Breast cancer Mother    Ovarian cancer Sister    Cancer Brother    Cancer Maternal Uncle    Breast cancer Niece    Cancer Brother      Social  History: Social History   Socioeconomic History   Marital status: Married    Spouse name: Not on file   Number of children: 2   Years of education: Not on file   Highest education level: Not on file  Occupational History   Not on file  Tobacco Use   Smoking status: Every Day    Packs/day: 0.50    Years: 60.00    Pack years: 30.00    Types: Cigarettes   Smokeless tobacco: Never  Substance and Sexual Activity   Alcohol use: Not Currently   Drug use: Not Currently   Sexual activity: Not Currently  Other Topics Concern   Not on file  Social History Narrative   Not on file   Social Determinants of Health   Financial Resource Strain: Low Risk    Difficulty of Paying Living Expenses: Not hard at all  Food Insecurity: No Food Insecurity   Worried About Charity fundraiser in the Last Year: Never true   Ran Out of Food in the Last Year: Never true  Transportation Needs: No Transportation Needs   Lack of Transportation (Medical): No   Lack of Transportation (Non-Medical): No  Physical Activity: Inactive   Days of Exercise per Week: 0 days   Minutes of Exercise per Session: 0 min  Stress: No Stress Concern Present   Feeling of Stress : Not at all  Social Connections: Socially Isolated  Frequency of Communication with Friends and Family: More than three times a week   Frequency of Social Gatherings with Friends and Family: More than three times a week   Attends Religious Services: Never   Marine scientist or Organizations: No   Attends Archivist Meetings: Never   Marital Status: Widowed  Human resources officer Violence: Not At Risk   Fear of Current or Ex-Partner: No   Emotionally Abused: No   Physically Abused: No   Sexually Abused: No     Medications Prior to Admission  Medication Sig Dispense Refill Last Dose   atorvastatin (LIPITOR) 80 MG tablet Take 80 mg by mouth at bedtime.   10/05/2021   budesonide-formoterol (SYMBICORT) 80-4.5 MCG/ACT inhaler Inhale  2 puffs into the lungs 2 (two) times daily.   10/06/2021   Ferrous Fumarate (HEMOCYTE - 106 MG FE) 324 (106 Fe) MG TABS tablet Take 1 tablet by mouth.   Past Week   furosemide (LASIX) 40 MG tablet Take 40 mg by mouth daily.   10/06/2021   magnesium oxide (MAG-OX) 400 (241.3 Mg) MG tablet Take 400 mg by mouth 2 (two) times daily.   10/06/2021   metFORMIN (GLUCOPHAGE) 1000 MG tablet Take 1,000 mg by mouth 2 (two) times daily.   10/06/2021   pioglitazone (ACTOS) 30 MG tablet Take 30 mg by mouth daily.   10/06/2021   spironolactone (ALDACTONE) 25 MG tablet Take 25 mg by mouth daily.   10/06/2021   clotrimazole-betamethasone (LOTRISONE) cream Apply 1 application topically 2 (two) times daily. (Patient not taking: Reported on 10/06/2021) 30 g 0 Completed Course   ondansetron (ZOFRAN) 4 MG tablet Take 1 tablet (4 mg total) by mouth every 4 (four) hours as needed for nausea. (Patient not taking: Reported on 05/07/2021) 90 tablet 3    ondansetron (ZOFRAN-ODT) 4 MG disintegrating tablet Take by mouth at bedtime. (Patient not taking: Reported on 05/07/2021)       Review of Systems  Constitutional: Negative for decreased appetite, malaise/fatigue, weight gain and weight loss.  HENT:  Negative for congestion.   Eyes:  Negative for visual disturbance.  Cardiovascular:  Positive for dyspnea on exertion. Negative for chest pain, leg swelling, palpitations and syncope.  Respiratory:  Positive for cough and shortness of breath.   Endocrine: Negative for cold intolerance.  Hematologic/Lymphatic: Does not bruise/bleed easily.  Skin:  Negative for itching and rash.  Musculoskeletal:  Negative for myalgias.  Gastrointestinal:  Negative for abdominal pain, nausea and vomiting.  Genitourinary:  Negative for dysuria.  Neurological:  Negative for dizziness and weakness.  Psychiatric/Behavioral:  The patient is not nervous/anxious.   All other systems reviewed and are negative.    Physical Exam: Physical  Exam Vitals and nursing note reviewed.  Constitutional:      General: He is not in acute distress.    Appearance: He is well-developed.  HENT:     Head: Normocephalic and atraumatic.  Eyes:     Conjunctiva/sclera: Conjunctivae normal.     Pupils: Pupils are equal, round, and reactive to light.  Neck:     Vascular: JVD present.  Cardiovascular:     Rate and Rhythm: Normal rate and regular rhythm.     Pulses: Decreased pulses.     Heart sounds: No murmur heard. Pulmonary:     Effort: Pulmonary effort is normal.     Breath sounds: Normal breath sounds. No wheezing or rales.  Abdominal:     General: Bowel sounds are normal.  Palpations: Abdomen is soft.     Tenderness: There is no rebound.  Musculoskeletal:        General: No tenderness. Normal range of motion.     Right lower leg: No edema.     Left lower leg: No edema.  Lymphadenopathy:     Cervical: No cervical adenopathy.  Skin:    General: Skin is warm and dry.  Neurological:     Mental Status: He is alert and oriented to person, place, and time.     Cranial Nerves: No cranial nerve deficit.     Labs:   Lab Results  Component Value Date   WBC 8.0 10/08/2021   HGB 10.3 (L) 10/08/2021   HCT 31.4 (L) 10/08/2021   MCV 92.4 10/08/2021   PLT 207 10/08/2021    Recent Labs  Lab 10/07/21 0313 10/08/21 0257  NA 128* 133*  K 4.2 4.3  CL 95* 97*  CO2 22 26  BUN 43* 57*  CREATININE 1.16 1.21  CALCIUM 8.5* 8.8*  PROT 6.2*  --   BILITOT 0.9  --   ALKPHOS 92  --   ALT 70*  --   AST 76*  --   GLUCOSE 223* 146*     Latest Reference Range & Units 10/06/21 16:14 10/06/21 17:31 10/06/21 18:07  B Natriuretic Peptide 0.0 - 100.0 pg/mL  1,252.4 (H)   Troponin I (High Sensitivity) <18 ng/L 59 (H)  55 (H)  (H): Data is abnormally high  BNP (last 3 results) Recent Labs    10/06/21 1731  BNP 1,252.4*    HEMOGLOBIN A1C Lab Results  Component Value Date   HGBA1C 6.7 (H) 10/07/2021   MPG 146 10/07/2021     Cardiac Panel (last 3 results) No results for input(s): CKTOTAL, CKMB, RELINDX in the last 8760 hours.  Invalid input(s): TROPONINHS  No results found for: CKTOTAL, CKMB, CKMBINDEX   TSH Recent Labs    07/16/21 1456 08/15/21 1121 10/06/21 2026  TSH 1.541 2.498 1.736      Radiology: ECHOCARDIOGRAM COMPLETE  Result Date: 10/07/2021    ECHOCARDIOGRAM REPORT   Patient Name:   DVONTAE RUAN Date of Exam: 10/07/2021 Medical Rec #:  [id number]          Height:       68.0 in Accession #:    [id number]         Weight:       152.6 lb Date of Birth:  01/05/1941          BSA:          1.822 m Patient Age:    62 years           BP:           93/72 mmHg Patient Gender: M                  HR:           75 bpm. Exam Location:  Inpatient Procedure: 2D Echo Indications:    dyspnea  History:        Patient has no prior history of Echocardiogram examinations.                 Signs/Symptoms:Influenza; Risk Factors:Diabetes and                 Hypertension.  Sonographer:    Johny Chess RDCS Referring Phys: [id number] NISCHAL NARENDRA IMPRESSIONS  1. Left ventricular ejection fraction, by estimation, is <20%. The left  ventricle has severely decreased function. The left ventricle demonstrates global hypokinesis. The left ventricular internal cavity size was moderately dilated. Left ventricular diastolic parameters are indeterminate.  2. Right ventricular systolic function is normal. The right ventricular size is normal. There is moderately elevated pulmonary artery systolic pressure.  3. Left atrial size was mild to moderately dilated.  4. Small to moderate pericardial effusion surrounds heart. Most apparent around RA, measures 13 mm in maximal dimension.  5. Mild mitral valve regurgitation.  6. Tricuspid valve regurgitation is mild to moderate.  7. Aortic valve regurgitation is mild.  8. Aortic dilatation noted. There is mild dilatation of the aortic root, measuring 39 mm.  9. The inferior vena cava is  dilated in size with <50% respiratory variability, suggesting right atrial pressure of 15 mmHg. FINDINGS  Left Ventricle: Left ventricular ejection fraction, by estimation, is <20%. The left ventricle has severely decreased function. The left ventricle demonstrates global hypokinesis. The left ventricular internal cavity size was moderately dilated. There is no left ventricular hypertrophy. Left ventricular diastolic parameters are indeterminate. Right Ventricle: The right ventricular size is normal. Right vetricular wall thickness was not assessed. Right ventricular systolic function is normal. There is moderately elevated pulmonary artery systolic pressure. The tricuspid regurgitant velocity is  3.46 m/s, and with an assumed right atrial pressure of 5 mmHg, the estimated right ventricular systolic pressure is 18.5 mmHg. Left Atrium: Left atrial size was mild to moderately dilated. Right Atrium: Right atrial size was normal in size. Pericardium: Small to moderate pericardial effusion surrounds heart. Most apparent around RA, measures 13 mm in maximal dimension. Mitral Valve: There is mild thickening of the mitral valve leaflet(s). Mild mitral annular calcification. Mild mitral valve regurgitation. Tricuspid Valve: The tricuspid valve is normal in structure. Tricuspid valve regurgitation is mild to moderate. Aortic Valve: Aortic valve regurgitation is mild. Aortic regurgitation PHT measures 434 msec. Pulmonic Valve: The pulmonic valve was normal in structure. Pulmonic valve regurgitation is not visualized. Aorta: Aortic dilatation noted. There is mild dilatation of the aortic root, measuring 39 mm. Venous: The inferior vena cava is dilated in size with less than 50% respiratory variability, suggesting right atrial pressure of 15 mmHg. IAS/Shunts: No atrial level shunt detected by color flow Doppler. Additional Comments: A device lead is visualized.  LEFT VENTRICLE PLAX 2D LVIDd:         6.60 cm LVIDs:         5.90  cm LV PW:         1.10 cm LV IVS:        0.80 cm LVOT diam:     2.20 cm LV SV:         61 LV SV Index:   33 LVOT Area:     3.80 cm  LV Volumes (MOD) LV vol d, MOD A2C: 147.0 ml LV vol s, MOD A2C: 126.0 ml LV SV MOD A2C:     21.0 ml RIGHT VENTRICLE            IVC RV S prime:     9.25 cm/s  IVC diam: 2.00 cm TAPSE (M-mode): 1.2 cm LEFT ATRIUM             Index        RIGHT ATRIUM           Index LA diam:        3.80 cm 2.09 cm/m   RA Area:     22.30 cm LA Vol (A2C):  89.4 ml 49.08 ml/m  RA Volume:   66.00 ml  36.23 ml/m LA Vol (A4C):   63.1 ml 34.64 ml/m LA Biplane Vol: 76.2 ml 41.83 ml/m  AORTIC VALVE LVOT Vmax:   84.60 cm/s LVOT Vmean:  51.400 cm/s LVOT VTI:    0.160 m AI PHT:      434 msec  AORTA Ao Root diam: 3.90 cm Ao Asc diam:  3.70 cm MR Peak grad:    46.2 mmHg    TRICUSPID VALVE MR Mean grad:    31.0 mmHg    TR Peak grad:   47.9 mmHg MR Vmax:         340.00 cm/s  TR Vmax:        346.00 cm/s MR Vmean:        266.0 cm/s MR PISA:         1.27 cm     SHUNTS MR PISA Eff ROA: 15 mm       Systemic VTI:  0.16 m MR PISA Radius:  0.45 cm      Systemic Diam: 2.20 cm MV E velocity: 3.92 cm/s Dorris Carnes MD Electronically signed by Dorris Carnes MD Signature Date/Time: 10/07/2021/2:14:42 PM    Final     Scheduled Meds:  atorvastatin  80 mg Oral QHS   enoxaparin (LOVENOX) injection  40 mg Subcutaneous Q24H   insulin aspart  0-20 Units Subcutaneous TID WC   insulin aspart  0-5 Units Subcutaneous QHS   magnesium oxide  400 mg Oral BID   metoprolol tartrate  25 mg Oral BID   mometasone-formoterol  2 puff Inhalation BID   oseltamivir  30 mg Oral BID   predniSONE  40 mg Oral Q breakfast   Continuous Infusions: PRN Meds:.acetaminophen **OR** acetaminophen, albuterol, guaiFENesin, ondansetron **OR** ondansetron (ZOFRAN) IV, senna-docusate  CARDIAC STUDIES:  EKG 10/07/2021: Afib with ventricular pacing  Echocardiogram 10/07/2021:  1. Left ventricular ejection fraction, by estimation, is <20%. The left   ventricle has severely decreased function. The left ventricle demonstrates  global hypokinesis. The left ventricular internal cavity size was  moderately dilated. Left ventricular diastolic parameters are indeterminate.   2. Right ventricular systolic function is normal. The right ventricular  size is normal. There is moderately elevated pulmonary artery systolic  pressure.   3. Left atrial size was mild to moderately dilated.   4. Small to moderate pericardial effusion surrounds heart. Most apparent  around RA, measures 13 mm in maximal dimension.   5. Mild mitral valve regurgitation.   6. Tricuspid valve regurgitation is mild to moderate.   7. Aortic valve regurgitation is mild.   8. Aortic dilatation noted. There is mild dilatation of the aortic root,  measuring 39 mm.   9. The inferior vena cava is dilated in size with <50% respiratory  variability, suggesting right atrial pressure of 15 mmHg.    Assessment & Recommendations:  80 y.o. Caucasian male  with hypertension, type 2 DM, COPD, nicotine dependence, CAD s/p ?MI/PCI (2015), ischemic cardiomyopathy with previously known LVEF 25%, s/p dual chamber ICD (St Jude), h/o atrial tachycardia, melanoma-currently on monthly chemotherapy, admitted with shortness of breath, Influenza A positive.   Acute hypoxic respiratory failure: Combination of influenza A infection and acute on chronic HFrEF. Management of Influenza as per primary team  Acute on chronic hypoxic respiratory failure: Last known EF 25% in the past. Now <20% with moderately dilated LV. This is likely gradual worsening, rather than acute drop in EF. At baseline, he is not  on any GDMT for HF. Given high risk for loss of follow-up and further worsening of his systolic heart failure, recommend initiating work-up and management for systolic up.  During the hospitalization.  Recommend right and left heart catheterization, coronary angiography, to rule out any obstructive CAD.   We will then start him on guideline directed medical therapy, starting with Entresto, perhaps beta-blocker. Recommend 2 g sodium restriction diet. He would likely benefit from home health for medication management.  Paroxysmal atrial fibrillation: New diagnosis.  Seen on EKG this hospitalization.  We will confirm on his St Jude ICD. CHA2DS2VAsc score 6, annual stroke risk 9% Recommend anticoagulation.  I would recommend starting oral anticoagulation with Eliquis after his heart catheterization.   Currently, rate controlled.  I would avoid metoprolol tartrate at this time, and other start succinate after performing that cardiac index is not too low. Discontinue pioglitazone, contraindicated in heart failure. Avoid IV diltiazem given very low EF, can act as negative inotropic.  I discussed with patient and his family regarding importance of longitudinal cardiac care.  They have expressed wishes to come to [facility] for his further cardiac care.  Will arrange cardiac follow-up.    Nigel Mormon, MD Pager: [phone] Office: [phone]

## 2021-10-09 ENCOUNTER — Encounter (HOSPITAL_COMMUNITY): Payer: Self-pay | Admitting: Cardiology

## 2021-10-09 ENCOUNTER — Encounter: Payer: Self-pay | Admitting: Oncology

## 2021-10-09 ENCOUNTER — Other Ambulatory Visit (HOSPITAL_COMMUNITY): Payer: Self-pay

## 2021-10-09 ENCOUNTER — Encounter (HOSPITAL_COMMUNITY)
Admission: EM | Disposition: A | Discharge status: Home Health | Payer: Self-pay | Source: Home / Self Care | Attending: Internal Medicine

## 2021-10-09 DIAGNOSIS — I5020 Unspecified systolic (congestive) heart failure: Secondary | ICD-10-CM

## 2021-10-09 DIAGNOSIS — J101 Influenza due to other identified influenza virus with other respiratory manifestations: Secondary | ICD-10-CM | POA: Diagnosis not present

## 2021-10-09 HISTORY — PX: RIGHT/LEFT HEART CATH AND CORONARY ANGIOGRAPHY: CATH118266

## 2021-10-09 LAB — POCT I-STAT EG7
Acid-Base Excess: 3 mmol/L — ABNORMAL HIGH (ref 0.0–2.0)
Acid-Base Excess: 4 mmol/L — ABNORMAL HIGH (ref 0.0–2.0)
Bicarbonate: 31.1 mmol/L — ABNORMAL HIGH (ref 20.0–28.0)
Bicarbonate: 31.7 mmol/L — ABNORMAL HIGH (ref 20.0–28.0)
Calcium, Ion: 1.2 mmol/L (ref 1.15–1.40)
Calcium, Ion: 1.28 mmol/L (ref 1.15–1.40)
HCT: 34 % — ABNORMAL LOW (ref 39.0–52.0)
HCT: 34 % — ABNORMAL LOW (ref 39.0–52.0)
Hemoglobin: 11.6 g/dL — ABNORMAL LOW (ref 13.0–17.0)
Hemoglobin: 11.6 g/dL — ABNORMAL LOW (ref 13.0–17.0)
O2 Saturation: 51 %
O2 Saturation: 54 %
Potassium: 4.3 mmol/L (ref 3.5–5.1)
Potassium: 4.4 mmol/L (ref 3.5–5.1)
Sodium: 135 mmol/L (ref 135–145)
Sodium: 136 mmol/L (ref 135–145)
TCO2: 33 mmol/L — ABNORMAL HIGH (ref 22–32)
TCO2: 34 mmol/L — ABNORMAL HIGH (ref 22–32)
pCO2, Ven: 63.3 mmHg — ABNORMAL HIGH (ref 44.0–60.0)
pCO2, Ven: 64.6 mmHg — ABNORMAL HIGH (ref 44.0–60.0)
pH, Ven: 7.298 (ref 7.250–7.430)
pH, Ven: 7.299 (ref 7.250–7.430)
pO2, Ven: 31 mmHg — CL (ref 32.0–45.0)
pO2, Ven: 32 mmHg (ref 32.0–45.0)

## 2021-10-09 LAB — COMPREHENSIVE METABOLIC PANEL
ALT: 108 U/L — ABNORMAL HIGH (ref 0–44)
AST: 90 U/L — ABNORMAL HIGH (ref 15–41)
Albumin: 3 g/dL — ABNORMAL LOW (ref 3.5–5.0)
Alkaline Phosphatase: 212 U/L — ABNORMAL HIGH (ref 38–126)
Anion gap: 8 (ref 5–15)
BUN: 61 mg/dL — ABNORMAL HIGH (ref 8–23)
CO2: 26 mmol/L (ref 22–32)
Calcium: 8.8 mg/dL — ABNORMAL LOW (ref 8.9–10.3)
Chloride: 96 mmol/L — ABNORMAL LOW (ref 98–111)
Creatinine, Ser: 1.33 mg/dL — ABNORMAL HIGH (ref 0.61–1.24)
GFR, Estimated: 54 mL/min — ABNORMAL LOW (ref >60)
Glucose, Bld: 162 mg/dL — ABNORMAL HIGH (ref 70–99)
Potassium: 4.2 mmol/L (ref 3.5–5.1)
Sodium: 130 mmol/L — ABNORMAL LOW (ref 135–145)
Total Bilirubin: 0.7 mg/dL (ref 0.3–1.2)
Total Protein: 6.2 g/dL — ABNORMAL LOW (ref 6.5–8.1)

## 2021-10-09 LAB — GLUCOSE, CAPILLARY
Glucose-Capillary: 173 mg/dL — ABNORMAL HIGH (ref 70–99)
Glucose-Capillary: 189 mg/dL — ABNORMAL HIGH (ref 70–99)
Glucose-Capillary: 159 mg/dL — ABNORMAL HIGH (ref 70–99)
Glucose-Capillary: 274 mg/dL — ABNORMAL HIGH (ref 70–99)
Glucose-Capillary: 308 mg/dL — ABNORMAL HIGH (ref 70–99)
Glucose-Capillary: 200 mg/dL — ABNORMAL HIGH (ref 70–99)

## 2021-10-09 LAB — CBC
HCT: 34.3 % — ABNORMAL LOW (ref 39.0–52.0)
Hemoglobin: 11.6 g/dL — ABNORMAL LOW (ref 13.0–17.0)
MCH: 31.7 pg (ref 26.0–34.0)
MCHC: 33.8 g/dL (ref 30.0–36.0)
MCV: 93.7 fL (ref 80.0–100.0)
Platelets: 259 10*3/uL (ref 150–400)
RBC: 3.66 MIL/uL — ABNORMAL LOW (ref 4.22–5.81)
RDW: 14 % (ref 11.5–15.5)
WBC: 8.5 10*3/uL (ref 4.0–10.5)
nRBC: 0 % (ref 0.0–0.2)

## 2021-10-09 LAB — POCT I-STAT 7, (LYTES, BLD GAS, ICA,H+H)
Acid-Base Excess: 2 mmol/L (ref 0.0–2.0)
Bicarbonate: 29.3 mmol/L — ABNORMAL HIGH (ref 20.0–28.0)
Calcium, Ion: 1.28 mmol/L (ref 1.15–1.40)
HCT: 34 % — ABNORMAL LOW (ref 39.0–52.0)
Hemoglobin: 11.6 g/dL — ABNORMAL LOW (ref 13.0–17.0)
O2 Saturation: 95 %
Potassium: 4.4 mmol/L (ref 3.5–5.1)
Sodium: 136 mmol/L (ref 135–145)
TCO2: 31 mmol/L (ref 22–32)
pCO2 arterial: 57.7 mmHg — ABNORMAL HIGH (ref 32.0–48.0)
pH, Arterial: 7.314 — ABNORMAL LOW (ref 7.350–7.450)
pO2, Arterial: 83 mmHg (ref 83.0–108.0)

## 2021-10-09 LAB — MAGNESIUM: Magnesium: 2.6 mg/dL — ABNORMAL HIGH (ref 1.7–2.4)

## 2021-10-09 SURGERY — RIGHT/LEFT HEART CATH AND CORONARY ANGIOGRAPHY
Anesthesia: LOCAL

## 2021-10-09 MED ORDER — APIXABAN 5 MG PO TABS
5.0000 mg | ORAL_TABLET | Freq: Two times a day (BID) | ORAL | Status: DC
  Administered 2021-10-09 – 2021-10-11 (×4): 5 mg via ORAL
  Filled 2021-10-09 (×4): qty 1

## 2021-10-09 MED ORDER — SODIUM CHLORIDE 0.9% FLUSH: 3.0000 mL | INTRAVENOUS | Status: DC | PRN

## 2021-10-09 MED ORDER — MIDAZOLAM HCL 2 MG/2ML IJ SOLN
INTRAMUSCULAR | Status: AC
  Filled 2021-10-09: qty 2

## 2021-10-09 MED ORDER — METOPROLOL SUCCINATE ER 25 MG PO TB24
12.5000 mg | ORAL_TABLET | Freq: Every day | ORAL | Status: DC
  Administered 2021-10-10 – 2021-10-11 (×2): 12.5 mg via ORAL
  Filled 2021-10-09 (×2): qty 1

## 2021-10-09 MED ORDER — LABETALOL HCL 5 MG/ML IV SOLN: 10.0000 mg | INTRAVENOUS | Status: AC | PRN

## 2021-10-09 MED ORDER — HEPARIN (PORCINE) IN NACL 1000-0.9 UT/500ML-% IV SOLN
INTRAVENOUS | Status: DC | PRN
  Administered 2021-10-09 (×2): 500 mL

## 2021-10-09 MED ORDER — ACETAMINOPHEN 325 MG PO TABS: 650.0000 mg | ORAL_TABLET | ORAL | Status: DC | PRN

## 2021-10-09 MED ORDER — VERAPAMIL HCL 2.5 MG/ML IV SOLN
INTRAVENOUS | Status: AC
  Filled 2021-10-09: qty 2

## 2021-10-09 MED ORDER — SODIUM CHLORIDE 0.9% FLUSH
3.0000 mL | Freq: Two times a day (BID) | INTRAVENOUS | Status: DC
  Administered 2021-10-09: 3 mL via INTRAVENOUS

## 2021-10-09 MED ORDER — SODIUM CHLORIDE 0.9 % IV SOLN
INTRAVENOUS | Status: AC | PRN
  Administered 2021-10-09: 10 mL/h via INTRAVENOUS

## 2021-10-09 MED ORDER — SODIUM CHLORIDE 0.9 % IV SOLN: 250.0000 mL | INTRAVENOUS | Status: DC | PRN

## 2021-10-09 MED ORDER — HEPARIN SODIUM (PORCINE) 1000 UNIT/ML IJ SOLN
INTRAMUSCULAR | Status: DC | PRN
  Administered 2021-10-09: 3500 [IU] via INTRAVENOUS

## 2021-10-09 MED ORDER — SODIUM CHLORIDE 0.9% FLUSH
3.0000 mL | Freq: Two times a day (BID) | INTRAVENOUS | Status: DC
  Administered 2021-10-09 – 2021-10-11 (×5): 3 mL via INTRAVENOUS

## 2021-10-09 MED ORDER — IOHEXOL 350 MG/ML SOLN
INTRAVENOUS | Status: DC | PRN
  Administered 2021-10-09: 15 mL

## 2021-10-09 MED ORDER — SODIUM CHLORIDE 0.9 % IV SOLN: INTRAVENOUS | Status: DC

## 2021-10-09 MED ORDER — ASPIRIN 81 MG PO CHEW
81.0000 mg | CHEWABLE_TABLET | ORAL | Status: AC
  Administered 2021-10-09: 81 mg via ORAL
  Filled 2021-10-09: qty 1

## 2021-10-09 MED ORDER — HEPARIN (PORCINE) IN NACL 1000-0.9 UT/500ML-% IV SOLN
INTRAVENOUS | Status: AC
  Filled 2021-10-09: qty 1000

## 2021-10-09 MED ORDER — MIDAZOLAM HCL 2 MG/2ML IJ SOLN
INTRAMUSCULAR | Status: DC | PRN
  Administered 2021-10-09: 1 mg via INTRAVENOUS

## 2021-10-09 MED ORDER — HEPARIN SODIUM (PORCINE) 1000 UNIT/ML IJ SOLN
INTRAMUSCULAR | Status: AC
  Filled 2021-10-09: qty 10

## 2021-10-09 MED ORDER — HYDRALAZINE HCL 20 MG/ML IJ SOLN: 10.0000 mg | INTRAMUSCULAR | Status: AC | PRN

## 2021-10-09 MED ORDER — VERAPAMIL HCL 2.5 MG/ML IV SOLN: INTRAVENOUS | Status: DC | PRN

## 2021-10-09 MED ORDER — LIDOCAINE HCL (PF) 1 % IJ SOLN
INTRAMUSCULAR | Status: DC | PRN
  Administered 2021-10-09 (×3): 2 mL

## 2021-10-09 MED ORDER — TORSEMIDE 20 MG PO TABS
20.0000 mg | ORAL_TABLET | Freq: Every day | ORAL | Status: DC
  Administered 2021-10-09 – 2021-10-11 (×3): 20 mg via ORAL
  Filled 2021-10-09 (×3): qty 1

## 2021-10-09 MED ORDER — FENTANYL CITRATE (PF) 100 MCG/2ML IJ SOLN
INTRAMUSCULAR | Status: DC | PRN
  Administered 2021-10-09: 50 ug via INTRAVENOUS

## 2021-10-09 MED ORDER — LIDOCAINE HCL (PF) 1 % IJ SOLN
INTRAMUSCULAR | Status: AC
  Filled 2021-10-09: qty 30

## 2021-10-09 MED ORDER — ONDANSETRON HCL 4 MG/2ML IJ SOLN: 4.0000 mg | Freq: Four times a day (QID) | INTRAMUSCULAR | Status: DC | PRN

## 2021-10-09 MED ORDER — ASPIRIN 81 MG PO CHEW: 81.0000 mg | CHEWABLE_TABLET | ORAL | Status: DC

## 2021-10-09 MED ORDER — FENTANYL CITRATE (PF) 100 MCG/2ML IJ SOLN
INTRAMUSCULAR | Status: AC
  Filled 2021-10-09: qty 2

## 2021-10-09 SURGICAL SUPPLY — 11 items
CATH BALLN WEDGE 5F 110CM (CATHETERS) ×2 IMPLANT
CATH INFINITI 5FR MULTPACK ANG (CATHETERS) ×2 IMPLANT
DEVICE RAD COMP TR BAND LRG (VASCULAR PRODUCTS) ×2 IMPLANT
GLIDESHEATH SLEND A-KIT 6F 22G (SHEATH) ×2 IMPLANT
GUIDEWIRE INQWIRE 1.5J.035X260 (WIRE) ×1 IMPLANT
INQWIRE 1.5J .035X260CM (WIRE) ×2
KIT HEART LEFT (KITS) ×2 IMPLANT
PACK CARDIAC CATHETERIZATION (CUSTOM PROCEDURE TRAY) ×2 IMPLANT
SHEATH GLIDE SLENDER 4/5FR (SHEATH) ×2 IMPLANT
TRANSDUCER W/STOPCOCK (MISCELLANEOUS) ×2 IMPLANT
TUBING CIL FLEX 10 FLL-RA (TUBING) ×2 IMPLANT

## 2021-10-09 NOTE — TOC Progression Note (Addendum)
Transition of Care [facility]) - Progression Note    Patient Details  Name: Kevin [name] MRN: [id number] Date of Birth: 04/07/1941  [facility] CM/SW Contact  Zenon Mayo, RN Phone Number: 10/09/2021, 8:55 AM  Clinical Narrative:    from home, afib, resp failure on metoprolol, pacemaker,  pt eval rec HHPT, TOC will continue to follow for dc needs.         Expected Discharge Plan and Services                                                 Social Determinants of Health (SDOH) Interventions    Readmission Risk Interventions No flowsheet data found.

## 2021-10-09 NOTE — Progress Notes (Signed)
ANTICOAGULATION CONSULT NOTE - Initial Consult  Pharmacy Consult for apixaban Indication: atrial fibrillation  No Known Allergies  Patient Measurements: Height: 5\' 8"  (172.7 cm) Weight: 69 kg (152 lb 3.2 oz) (scale b) IBW/kg (Calculated) : 68.4  Vital Signs: Temp: 98 F (36.7 C) (11/30 0900) Temp Source: Oral (11/30 0900) BP: 124/101 (11/30 1400) Pulse Rate: 94 (11/30 1400)  Labs: Recent Labs    10/06/21 1614 10/06/21 1807 10/07/21 0313 10/08/21 0257 10/09/21 0314 10/09/21 1140 10/09/21 1144  HGB 11.5*  --  10.8* 10.3* 11.6* 11.6*  11.6* 11.6*  HCT 35.9*  --  34.1* 31.4* 34.3* 34.0*  34.0* 34.0*  PLT 199  --  180 207 259  --   --   CREATININE 1.29*  --  1.16 1.21 1.33*  --   --   TROPONINIHS 59* 55*  --   --   --   --   --     Estimated Creatinine Clearance: 43.6 mL/min (A) (by C-G formula based on SCr of 1.33 mg/dL (H)).   Medications:  Scheduled:   atorvastatin  80 mg Oral QHS   insulin aspart  0-20 Units Subcutaneous TID WC   insulin aspart  0-5 Units Subcutaneous QHS   magnesium oxide  400 mg Oral BID   [START ON 10/10/2021] metoprolol succinate  12.5 mg Oral Daily   mometasone-formoterol  2 puff Inhalation BID   oseltamivir  30 mg Oral BID   predniSONE  40 mg Oral Q breakfast   sodium chloride flush  3 mL Intravenous Q12H   torsemide  20 mg Oral Daily    Assessment: 80 yo M with new onset afib to start apixaban.  Pt was previously on enoxaparin with last dose 11/29 at 1700.  Patient is s/p cath today, to start apixaban tonight.  Goal of Therapy:  Therapeutic Anticoagulation Monitor platelets by anticoagulation protocol: Yes   Plan:  Apixaban 5mg  PO BID Will need apixaban education prior to discharge.  [employer], Pharm.D., BCPS Clinical Pharmacist Clinical phone for 10/09/2021 from 7:30-3:00 is [phone].  **Pharmacist phone directory can be found on [email or web address] listed under [place].  10/09/2021 3:12 PM

## 2021-10-09 NOTE — TOC Benefit Eligibility Note (Addendum)
Patient Teacher, English as a foreign language completed.    The patient is currently admitted and upon discharge could be taking Eliquis 5 mg.  The current 30 day co-pay is, $9.85.   The patient is currently admitted and upon discharge could be taking Entresto 24-26 mg.  The current 30 day co-pay is, $9.85.   The patient is currently admitted and upon discharge could be taking Farxiga 10 mg.  The current 30 day co-pay is, $9.85.   The patient is currently admitted and upon discharge could be taking Jardiance 10 mg.  The current 30 day co-pay is, $9.85.   The patient is insured through [place], [place] Patient Advocate Specialist [place] Patient Advocate Team Direct Number: [phone]  Fax: [phone]

## 2021-10-09 NOTE — Progress Notes (Addendum)
OT Cancellation Note  Patient Details Name: Kevin [name] MRN: [id number] DOB: [birth year]   Cancelled Treatment:    Reason Eval/Treat Not Completed: Patient at procedure or test/ unavailable Off unit at cath lab. Will follow-up for OT session as pt available.  Re-attempted around 2PM with RN reporting pt lethargic and confused s/p procedure. Will follow-up for OT session, likely 11/30  Layla Maw 10/09/2021, 11:17 AM

## 2021-10-09 NOTE — Discharge Instructions (Addendum)
Kevin [name]  You were recently admitted to [facility] for the flu. We gave you some medications to help with your breathing and also to help your heart pump. Your oxygen levels were also low when you were in the hospital. We are sending you home with oxygen to use when you walk around/exert yourself. You can follow with your primary care doctor in the outpatient setting to see if you no longer need to use oxygen.   Continue taking your home medications with the following changes  Start taking jardiance 10 mg daily Start taking metoprolol succinate 12.5 once a day Start taking entresto twice a day Start Taking amiodarone 200 daily Start taking digoxin 0.125 once daily 6.  Start taking torsemide 20 mg  every morning 7.  Start taking eliquis 5 mg twice a day   1. Stop taking spironolactone (aldactone). Talk to your heart doctor in the outpatient setting about whether you should restart this medication 2. Stop taking pioglitazone (actos) 3. Stop taking furosemide (lasix)   You should seek further medical care if you have increasing shortness of breath, mucous production, fevers, cough, dizziness or lightheadedness, increasing leg swelling, or difficulty lying flat  We recommend that you follow up with your primary care doctor in about a week. You should also go see the heart doctor for your appointment on 10/25/2021 at 1:00 PM at [place].  We recommend that you see your primary care doctor in about a week to make sure that you continue to improve. We are so glad that you are feeling better.  Sincerely, Scarlett Presto, MD    Information on my medicine - ELIQUIS (apixaban)  This medication education was reviewed with me or my healthcare representative as part of my discharge preparation.    Why was Eliquis prescribed for you? Eliquis was prescribed for you to reduce the risk of a blood clot forming that can cause a stroke if you have a medical condition  called atrial fibrillation (a type of irregular heartbeat).  What do You need to know about Eliquis ? Take your Eliquis TWICE DAILY - one tablet in the morning and one tablet in the evening with or without food. If you have difficulty swallowing the tablet whole please discuss with your pharmacist how to take the medication safely.  Take Eliquis exactly as prescribed by your doctor and DO NOT stop taking Eliquis without talking to the doctor who prescribed the medication.  Stopping may increase your risk of developing a stroke.  Refill your prescription before you run out.  After discharge, you should have regular check-up appointments with your healthcare provider that is prescribing your Eliquis.  In the future your dose may need to be changed if your kidney function or weight changes by a significant amount or as you get older.  What do you do if you miss a dose? If you miss a dose, take it as soon as you remember on the same day and resume taking twice daily.  Do not take more than one dose of ELIQUIS at the same time to make up a missed dose.  Important Safety Information A possible side effect of Eliquis is bleeding. You should call your healthcare provider right away if you experience any of the following: Bleeding from an injury or your nose that does not stop. Unusual colored urine (red or dark brown) or unusual colored stools (red or black). Unusual bruising for unknown reasons. A serious fall or if you hit your head (  even if there is no bleeding).  Some medicines may interact with Eliquis and might increase your risk of bleeding or clotting while on Eliquis. To help avoid this, consult your healthcare provider or pharmacist prior to using any new prescription or non-prescription medications, including herbals, vitamins, non-steroidal anti-inflammatory drugs (NSAIDs) and supplements.  This website has more information on Eliquis (apixaban):  http://[email or web address].com/eliquis/home

## 2021-10-09 NOTE — Progress Notes (Signed)
Patient arrived back to unit. Intervention site to right wrist clean with no evidence of bleeding. Balloon in place, family at bedside. VS obtained. Will report to primary nurse, Joycelyn Schmid.

## 2021-10-09 NOTE — Progress Notes (Signed)
HD#3 Subjective:  Overnight Events: NAEO   Patient feeling ok this morning. Says that he has good days and bad days. Spoke with the cardiologist yesterday. He would like to be able to have something to eat.  Objective:  Vital signs in last 24 hours: Vitals:   10/08/21 1537 10/08/21 1949 10/08/21 2100 10/09/21 0449  BP: 95/64 102/74 105/69 101/66  Pulse: 76 78 62 98  Resp: 20 12 18 20   Temp: 98 F (36.7 C) 98.1 F (36.7 C)  (!) 97.5 F (36.4 C)  TempSrc:  Oral  Oral  SpO2: 100% 100% 100% 100%  Weight:    69 kg  Height:       Supplemental O2: Room Air SpO2: 100 % O2 Flow Rate (L/min): 2 L/min   Physical Exam:  Constitutional: elderly gentleman laying in bed in NAD  Eyes: conjunctiva non-erythematous Cardiovascular: irregularly irregular rhythm, no m/r/g Pulmonary/Chest: normal work of breathing on room air, expiratory wheezing Abdominal: soft, non-distended, normal bowel sounds MSK: normal bulk and tone Neurological: alert & oriented x 3, answering questions appropriately Skin: warm and dry Psych: normal affect  Filed Weights   10/06/21 2019 10/08/21 0425 10/09/21 0449  Weight: 69.2 kg 68.9 kg 69 kg     Intake/Output Summary (Last 24 hours) at 10/09/2021 0746 Last data filed at 10/09/2021 0449 Gross per 24 hour  Intake 600 ml  Output 800 ml  Net -200 ml   Net IO Since Admission: -160.63 mL [10/09/21 0746]  Pertinent Labs: CBC Latest Ref Rng & Units 10/09/2021 10/08/2021 10/07/2021  WBC 4.0 - 10.5 K/uL 8.5 8.0 7.0  Hemoglobin 13.0 - 17.0 g/dL 11.6(L) 10.3(L) 10.8(L)  Hematocrit 39.0 - 52.0 % 34.3(L) 31.4(L) 34.1(L)  Platelets 150 - 400 K/uL 259 207 180    CMP Latest Ref Rng & Units 10/09/2021 10/08/2021 10/07/2021  Glucose 70 - 99 mg/dL 162(H) 146(H) 223(H)  BUN 8 - 23 mg/dL 61(H) 57(H) 43(H)  Creatinine 0.61 - 1.24 mg/dL 1.33(H) 1.21 1.16  Sodium 135 - 145 mmol/L 130(L) 133(L) 128(L)  Potassium 3.5 - 5.1 mmol/L 4.2 4.3 4.2  Chloride 98 - 111  mmol/L 96(L) 97(L) 95(L)  CO2 22 - 32 mmol/L 26 26 22   Calcium 8.9 - 10.3 mg/dL 8.8(L) 8.8(L) 8.5(L)  Total Protein 6.5 - 8.1 g/dL 6.2(L) - 6.2(L)  Total Bilirubin 0.3 - 1.2 mg/dL 0.7 - 0.9  Alkaline Phos 38 - 126 U/L 212(H) - 92  AST 15 - 41 U/L 90(H) - 76(H)  ALT 0 - 44 U/L 108(H) - 70(H)    Imaging: No results found.  Assessment/Plan:   Principal Problem:   Influenza due to influenza A virus   Patient Summary: Kevin [name] is a 80 y.o. with pertinent PMH of atrial fibrillation, HFrEF s/p AICD, MI, stage IIB melanoma,  HTN, T2DM, who presented with shortness of breath and was found to be flu positive.   #Acute hypoxic failure 2/2 influenza A #COPD Patient's breathing continues to improve, no longer requiring supplemental oxygen. Once patient is ready from a cardiac perspective can likely discharge home and finish his course of prednisone and tamiflu. - NPO for procedure - continue 5 day course tamiflu end 12/2 - continue prednisone 40 daily  - continue ventolin, dulera, proventil - guaifenesin for mucous  - s/p 1 dose of lasix 40, holding lasix for now   #Hx of Atrial tachycardia #New onset atrial fibrillation #Ischemic cardiomyopathy; HFrEF Interrogated patient's pacemaker yesterday, he has been in atrial fibrillation  for two days which was confirmed to be new onset. Cardiology was consulted given worsened EF and new onset afib. Plan for R/L heart cath today and will then start titrating GDMT. -cardiology consult appreciate assistance - d/c metoprolol, likely restart succinate after procedure - CHA2DS2-VASc Score =   6, HASBLED 3  -cards recommends eliquis following procedure - npo for R/L heart cath 11/30 - titrate GDMT after cath   #possible AKI  Likely secondary to poor PO intake. Will continue to monitor BMPs  - avoid nephrotoxic medications - daily bmp - encourage PO intake.   #Melanoma stage IIB Patient has monthly nivolumab infusions.  - CTM    #T2DM -holding metformin, d/c pioglitazone - SSI   #HTN - holding spironolactone 2/2 low/normal blood pressures   #Elevated LFTs No documented history of liver disease. - daily CMP   #Anemia - daily cbc - transfuse <7   #Hyponatremia - daily bmp   #HLD - Continue home lipitor   Diet: Heart Healthy VTE: Enoxaparin IVF: None,None Code: DNR  Scarlett Presto, MD Internal Medicine Resident PGY-1 Pager [phone] Please contact the on call pager after 5 pm and on weekends at [phone].

## 2021-10-09 NOTE — Interval H&P Note (Signed)
History and Physical Interval Note:  10/09/2021 11:19 AM  Kevin [name]  has presented today for surgery, with the diagnosis of heart failure.  The various methods of treatment have been discussed with the patient and family. After consideration of risks, benefits and other options for treatment, the patient has consented to  Procedure(s): RIGHT/LEFT HEART CATH AND CORONARY ANGIOGRAPHY (N/A) as a surgical intervention.  The patient's history has been reviewed, patient examined, no change in status, stable for surgery.  I have reviewed the patient's chart and labs.  Questions were answered to the patient's satisfaction.    2012 Appropriate Use Criteria for Diagnostic Catheterization Cardiomyopathies (Right and Left Heart Catheterization OR Right Heart Catheterization Alone With/Without Left Ventriculography and Coronary Angiography) Indication:  Known or suspected cardiomyopathy with or without heart failure A (7) Indication: 93; Score 7 291 Indication:  Re-evaluation of known cardiomyopathy Change in clinical status or cardiac exam or to guide therapy A (7) Indication: 94; Score 7   Shadai Mcclane J Renaldo Gornick

## 2021-10-10 ENCOUNTER — Other Ambulatory Visit (HOSPITAL_COMMUNITY): Payer: Self-pay

## 2021-10-10 DIAGNOSIS — I4891 Unspecified atrial fibrillation: Secondary | ICD-10-CM

## 2021-10-10 DIAGNOSIS — J101 Influenza due to other identified influenza virus with other respiratory manifestations: Secondary | ICD-10-CM | POA: Diagnosis not present

## 2021-10-10 LAB — COMPREHENSIVE METABOLIC PANEL
ALT: 101 U/L — ABNORMAL HIGH (ref 0–44)
AST: 59 U/L — ABNORMAL HIGH (ref 15–41)
Albumin: 2.9 g/dL — ABNORMAL LOW (ref 3.5–5.0)
Alkaline Phosphatase: 168 U/L — ABNORMAL HIGH (ref 38–126)
Anion gap: 10 (ref 5–15)
BUN: 59 mg/dL — ABNORMAL HIGH (ref 8–23)
CO2: 28 mmol/L (ref 22–32)
Calcium: 8.9 mg/dL (ref 8.9–10.3)
Chloride: 98 mmol/L (ref 98–111)
Creatinine, Ser: 1.25 mg/dL — ABNORMAL HIGH (ref 0.61–1.24)
GFR, Estimated: 59 mL/min — ABNORMAL LOW (ref >60)
Glucose, Bld: 190 mg/dL — ABNORMAL HIGH (ref 70–99)
Potassium: 4.6 mmol/L (ref 3.5–5.1)
Sodium: 136 mmol/L (ref 135–145)
Total Bilirubin: 0.7 mg/dL (ref 0.3–1.2)
Total Protein: 5.9 g/dL — ABNORMAL LOW (ref 6.5–8.1)

## 2021-10-10 LAB — CBC
HCT: 33 % — ABNORMAL LOW (ref 39.0–52.0)
Hemoglobin: 10.6 g/dL — ABNORMAL LOW (ref 13.0–17.0)
MCH: 30.1 pg (ref 26.0–34.0)
MCHC: 32.1 g/dL (ref 30.0–36.0)
MCV: 93.8 fL (ref 80.0–100.0)
Platelets: 256 10*3/uL (ref 150–400)
RBC: 3.52 MIL/uL — ABNORMAL LOW (ref 4.22–5.81)
RDW: 14.2 % (ref 11.5–15.5)
WBC: 7.8 10*3/uL (ref 4.0–10.5)
nRBC: 0 % (ref 0.0–0.2)

## 2021-10-10 LAB — GLUCOSE, CAPILLARY
Glucose-Capillary: 152 mg/dL — ABNORMAL HIGH (ref 70–99)
Glucose-Capillary: 185 mg/dL — ABNORMAL HIGH (ref 70–99)
Glucose-Capillary: 323 mg/dL — ABNORMAL HIGH (ref 70–99)
Glucose-Capillary: 187 mg/dL — ABNORMAL HIGH (ref 70–99)

## 2021-10-10 MED ORDER — AMIODARONE HCL 200 MG PO TABS
400.0000 mg | ORAL_TABLET | Freq: Three times a day (TID) | ORAL | Status: DC
  Administered 2021-10-10 – 2021-10-11 (×4): 400 mg via ORAL
  Filled 2021-10-10 (×4): qty 2

## 2021-10-10 MED ORDER — DIGOXIN 125 MCG PO TABS
0.2500 mg | ORAL_TABLET | ORAL | Status: AC
  Administered 2021-10-10 (×3): 0.25 mg via ORAL
  Filled 2021-10-10 (×3): qty 2

## 2021-10-10 MED ORDER — SACUBITRIL-VALSARTAN 24-26 MG PO TABS
1.0000 | ORAL_TABLET | Freq: Two times a day (BID) | ORAL | Status: DC
  Administered 2021-10-10 (×2): 1 via ORAL
  Filled 2021-10-10 (×2): qty 1

## 2021-10-10 MED ORDER — DIGOXIN 125 MCG PO TABS
0.1250 mg | ORAL_TABLET | Freq: Every day | ORAL | Status: DC
  Administered 2021-10-11: 0.125 mg via ORAL
  Filled 2021-10-10 (×2): qty 1

## 2021-10-10 NOTE — Progress Notes (Signed)
Heart Failure Nurse Navigator Progress Note  Assessed for HV TOC readiness. Pt from [place], [place]. Lives with his two daughters who help with meds set up, cooking and driving. Pt has not seen cardiologist for 3 years--[facility] in the past at [facility] location. Pt getting active chemo treatment via port-a-cath for malignant melanoma.   [facility] cardiology consulted this admission. HV TOC clinic available for close follow up appt.   Will continue to follow for HF education/resources.  Pricilla Holm, MSN, RN Heart Failure Nurse Navigator [phone]

## 2021-10-10 NOTE — Progress Notes (Signed)
Physical Therapy Treatment Patient Details Name: Kevin [name] MRN: [id number] DOB: 1941-02-07 Today's Date: 10/10/2021   History of Present Illness Kevin [name] is a 80 y.o. male who presented 10/06/21 with shortness of breath and weakness that started 11/18. Pt also with one fall at home prior to admission. Pt tested positive for influenza A. S/p R and L heart cath and coronary angiography 11/30. PMH:  atrial fibrillation s/p PPM, MI, stage IIB melanoma, congestive HF, HTN, T2DM    PT Comments    Pt with increased confusion with impaired insight into deficits and safety. In addition, pt with increased imbalance needing up to minA when he fatigues, placing him at risk for falls. He would benefit from increased assistance at d/c for safety purposes until his cognition improves. Pt with particular weakness noted with eccentric control in his quads and gastrocs with standing exercises this date. Will continue to follow acutely. Continuing to recommend HHPT at d/c.    Recommendations for follow up therapy are one component of a multi-disciplinary discharge planning process, led by the attending physician.  Recommendations may be updated based on patient status, additional functional criteria and insurance authorization.  Follow Up Recommendations  Home health PT     Assistance Recommended at Discharge Frequent or constant Supervision/Assistance  Equipment Recommendations  Rolling walker (2 wheels)    Recommendations for Other Services       Precautions / Restrictions Precautions Precautions: Fall Precaution Comments: monitor vitals; does not wear O2 at baseline Restrictions Weight Bearing Restrictions: No     Mobility  Bed Mobility Overal bed mobility: Modified Independent Bed Mobility: Supine to Sit;Sit to Supine     Supine to sit: Modified independent (Device/Increase time);HOB elevated Sit to supine: Modified independent (Device/Increase time);HOB elevated    General bed mobility comments: Extra time and use of bed rails and controls, no assistance needed.    Transfers Overall transfer level: Needs assistance Equipment used: Rolling walker (2 wheels) Transfers: Sit to/from Stand Sit to Stand: Min guard           General transfer comment: min guard for safety, time to gain balance and posture    Ambulation/Gait Ambulation/Gait assistance: Min guard;Min assist Gait Distance (Feet): 196 Feet Assistive device: Rolling walker (2 wheels) Gait Pattern/deviations: Step-through pattern;Decreased step length - right;Decreased step length - left;Decreased stride length;Narrow base of support;Drifts right/left Gait velocity: decreased Gait velocity interpretation: <1.8 ft/sec, indicate of risk for recurrent falls   General Gait Details: Pt with elevated shoulders and narrow stance, cues provided to correct with momentary success. Slow gait with intermittent instability noted in knees, increased trunk sway noted following gait bout when performing standing exercises, likely due to fatigue. Majority of time needing min guard for safety, but minA to steady as he fatigued.   Stairs             Wheelchair Mobility    Modified Rankin (Stroke Patients Only)       Balance Overall balance assessment: Needs assistance Sitting-balance support: Feet supported Sitting balance-Leahy Scale: Good     Standing balance support: Reliant on assistive device for balance Standing balance-Leahy Scale: Poor Standing balance comment: Relaint on RW for mobility and standing exercises.                            Cognition Arousal/Alertness: Awake/alert Behavior During Therapy: WFL for tasks assessed/performed Overall Cognitive Status: Impaired/Different from baseline Area of Impairment: Attention;Memory;Following commands;Safety/judgement;Awareness;Problem solving  Current Attention Level: Selective Memory:  Decreased short-term memory Following Commands: Follows one step commands with increased time;Follows multi-step commands with increased time Safety/Judgement: Decreased awareness of deficits;Decreased awareness of safety Awareness: Emergent Problem Solving: Difficulty sequencing;Requires verbal cues General Comments: Pt needing repeated cues to keep RW on ground, suggesting deficits in safety awareness. Pt denying getting tired even though displayed signs at times.        Exercises General Exercises - Lower Extremity Hip Flexion/Marching: Strengthening;Both;10 reps;Standing (with RW) Heel Raises: Strengthening;Both;5 reps;Standing (with RW) Other Exercises Other Exercises: Sit <> stand from EOB using UEs = 5x, cuing to control descent with improved quality noted with subsequent reps    General Comments General comments (skin integrity, edema, etc.): Poor pleth readings, SpO2 80s-90s% on RA-2L, HR 90s-110s      Pertinent Vitals/Pain Pain Assessment: No/denies pain    Home Living                          Prior Function            PT Goals (current goals can now be found in the care plan section) Acute Rehab PT Goals Patient Stated Goal: to improve PT Goal Formulation: With patient Time For Goal Achievement: 10/22/21 Potential to Achieve Goals: Good Progress towards PT goals: Progressing toward goals    Frequency    Min 3X/week      PT Plan Discharge plan needs to be updated    Co-evaluation              AM-PAC PT "6 Clicks" Mobility   Outcome Measure  Help needed turning from your back to your side while in a flat bed without using bedrails?: None Help needed moving from lying on your back to sitting on the side of a flat bed without using bedrails?: None Help needed moving to and from a bed to a chair (including a wheelchair)?: A Little Help needed standing up from a chair using your arms (e.g., wheelchair or bedside chair)?: A Little Help  needed to walk in hospital room?: A Little Help needed climbing 3-5 steps with a railing? : A Little 6 Click Score: 20    End of Session Equipment Utilized During Treatment: Gait belt;Oxygen Activity Tolerance: Patient tolerated treatment well Patient left: in bed;with call bell/phone within reach;with bed alarm set Nurse Communication: Mobility status PT Visit Diagnosis: Muscle weakness (generalized) (M62.81);Difficulty in walking, not elsewhere classified (R26.2);Unsteadiness on feet (R26.81);Other abnormalities of gait and mobility (R26.89)     Time: 7001-7494 PT Time Calculation (min) (ACUTE ONLY): 20 min  Charges:  $Gait Training: 8-22 mins                     Moishe Spice, PT, DPT Acute Rehabilitation Services  Pager: [phone] Office: [place] 10/10/2021, 10:34 AM

## 2021-10-10 NOTE — Progress Notes (Signed)
Occupational Therapy Treatment Patient Details Name: Kevin [name] MRN: [id number] DOB: [birth year] Today's Date: 10/10/2021   History of present illness Kevin [name] is a 80 y.o. male who presented with shortness of breath and weakness that started 11/18. Pt also with one fall at home prior to admission. Pt tested positive for influenza A. PMH:  atrial fibrillation s/p PPM, MI, stage IIB melanoma, congestive HF, HTN, T2DM   OT comments  Pt with increased confusion and decreased insight into deficits/safety during this session. Pt required hands on assist to correct LOB with ADLs standing at sink and physical assist to maneuver RW in room to prevent fall. Emphasized fall prevention strategies and energy conservation (use of shower chair at home) to maximize safety with ADL completion. Pt noted with 2/4 observable DOE with these activities but pt denies SOB. Recommend increased supervision/assist for bathing tasks, mobility in home.   HR 117bpm SpO2 WFL on 3 L O2   Recommendations for follow up therapy are one component of a multi-disciplinary discharge planning process, led by the attending physician.  Recommendations may be updated based on patient status, additional functional criteria and insurance authorization.    Follow Up Recommendations  Home health OT    Assistance Recommended at Discharge Frequent or constant Supervision/Assistance  Equipment Recommendations  BSC/3in1;Other (comment) (Rolling walker if does not already have)    Recommendations for Other Services      Precautions / Restrictions Precautions Precautions: Fall Precaution Comments: monitor vitals; does not wear O2 at baseline Restrictions Weight Bearing Restrictions: No       Mobility Bed Mobility Overal bed mobility: Modified Independent Bed Mobility: Supine to Sit     Supine to sit: Modified independent (Device/Increase time);HOB elevated          Transfers Overall transfer level: Needs  assistance Equipment used: Rolling walker (2 wheels) Transfers: Sit to/from Stand Sit to Stand: Min guard           General transfer comment: min guard for safety, time to gain balance and posture     Balance Overall balance assessment: Needs assistance Sitting-balance support: Feet supported Sitting balance-Leahy Scale: Good     Standing balance support: Bilateral upper extremity supported;During functional activity;Reliant on assistive device for balance Standing balance-Leahy Scale: Fair Standing balance comment: fair static standing at sink. With increased activity, pt with posterior lean and LOB with min guard to correct                           ADL either performed or assessed with clinical judgement   ADL Overall ADL's : Needs assistance/impaired Eating/Feeding: Set up;Sitting Eating/Feeding Details (indicate cue type and reason): assist to cut food, open containers Grooming: Min guard;Standing;Brushing hair;Wash/dry face Grooming Details (indicate cue type and reason): washed face initially, repetitive with this task despite multiple cues to move to completing peri care     Lower Body Bathing: Minimal assistance;Sit to/from stand Lower Body Bathing Details (indicate cue type and reason): MIn A to maintain balance as pt bending forward to bathe lower  legs with posterior lean. assist with clothing mgmt when bathing peri region     Lower Body Dressing: Minimal assistance;Sit to/from stand Lower Body Dressing Details (indicate cue type and reason): able to don R sock, slippers with OT assisting to block B slippers so pt could slide feet in             Functional mobility during ADLs: Minimal  assistance;Rolling walker (2 wheels) General ADL Comments: Increased confusion, decreased insight into safety/deficits and hands on assist needed to maintain/correct LOB and assist with DME mgmt today. Pt with 2/4 observable DOE though pt denies fatigue with these  tasks    Extremity/Trunk Assessment Upper Extremity Assessment Upper Extremity Assessment: Generalized weakness   Lower Extremity Assessment Lower Extremity Assessment: Defer to PT evaluation        Vision   Vision Assessment?: No apparent visual deficits   Perception     Praxis      Cognition Arousal/Alertness: Awake/alert Behavior During Therapy: WFL for tasks assessed/performed Overall Cognitive Status: Impaired/Different from baseline Area of Impairment: Attention;Memory;Following commands;Safety/judgement;Awareness;Problem solving                   Current Attention Level: Selective Memory: Decreased short-term memory Following Commands: Follows one step commands with increased time;Follows multi-step commands with increased time Safety/Judgement: Decreased awareness of deficits;Decreased awareness of safety Awareness: Emergent Problem Solving: Difficulty sequencing;Requires verbal cues General Comments: some increased confusion noted with pt unaware that bed pad was wet, decreased insight into HF and strategies to prevent exacerbation (diet changes, etc), and poor insight into deficits/increased cues for safety. Pt with LOB, performing LB tasks standing increasing fall risk and use of RW causing safety concerns with manuevering in room          Exercises     Shoulder Instructions       General Comments SpO2 WFL on 3 L O2, HR up to 117bpm with activity. Cardio PA and MD in separately during session to speak with pt    Pertinent Vitals/ Pain       Pain Assessment: No/denies pain  Home Living                                          Prior Functioning/Environment              Frequency  Min 2X/week        Progress Toward Goals  OT Goals(current goals can now be found in the care plan section)  Progress towards OT goals: Progressing toward goals  Acute Rehab OT Goals Patient Stated Goal: go home soon OT Goal Formulation:  With patient Time For Goal Achievement: 10/21/21 Potential to Achieve Goals: Good ADL Goals Pt Will Perform Lower Body Bathing: with modified independence;sit to/from stand Pt Will Transfer to Toilet: with modified independence;ambulating Pt/caregiver will Perform Home Exercise Program: Increased strength;Both right and left upper extremity;With theraband;Independently;With written HEP provided Additional ADL Goal #1: Pt to increase standing activity tolerance to > 5-7 min during ADLs/mobility to improve overall endurance  Plan Discharge plan remains appropriate    Co-evaluation                 AM-PAC OT "6 Clicks" Daily Activity     Outcome Measure   Help from another person eating meals?: A Little Help from another person taking care of personal grooming?: A Little Help from another person toileting, which includes using toliet, bedpan, or urinal?: A Little Help from another person bathing (including washing, rinsing, drying)?: A Little Help from another person to put on and taking off regular upper body clothing?: A Little Help from another person to put on and taking off regular lower body clothing?: A Little 6 Click Score: 18    End of Session Equipment Utilized During Treatment:  Rolling walker (2 wheels);Oxygen  OT Visit Diagnosis: Unsteadiness on feet (R26.81);Other abnormalities of gait and mobility (R26.89);Muscle weakness (generalized) (M62.81)   Activity Tolerance Patient tolerated treatment well   Patient Left in chair;with call bell/phone within reach;Other (comment);with chair alarm set   Nurse Communication          Time: [phone] OT Time Calculation (min): 32 min  Charges: OT General Charges $OT Visit: 1 Visit OT Treatments $Self Care/Home Management : 8-22 mins $Therapeutic Activity: 8-22 mins  Malachy Chamber, OTR/L Acute Rehab Services Office: [phone]   Layla Maw 10/10/2021, 9:09 AM

## 2021-10-10 NOTE — Progress Notes (Signed)
Heart Failure Stewardship Pharmacist Progress Note   PCP: [name], PA-C PCP-Cardiologist: None    HPI:  80 yo M with PMH of afib s/p PPM, MI, stage IIb melanoma, CHF, HTN, and T2DM. He presented to the ED on 11/27 with shortness of breath. CXR with mildly progressive cardiomegaly without acute cardiopulmonary process. An ECHO was done on 11/28 and found to have severe LV dysfunction, <20% (20-25% in 2018).  Current HF Medications: Diuretic: torsemide 20 mg daily Beta Blocker: metoprolol XL 12.5 mg daily ACE/ARB/ARNI: Entresto 24/26 mg BID Other: digoxin 0.25 mg q4h x 3, then 0.125 mg daily  Prior to admission HF Medications: Diuretic: furosemide 40 mg daily Aldosterone Antagonist: spironolactone 25 mg daily  Pertinent Lab Values: Serum creatinine 1.25, BUN 59, Potassium 4.6, Sodium 136, BNP 1252.4, Magnesium 2.6, A1c 6.7  Vital Signs: Weight: 151 lbs (admission weight: 152 lbs) Blood pressure: 90/50s  Heart rate: 80s  I/O: -1L yesterday; net -1L  Medication Assistance / Insurance Benefits Check: Does the patient have prescription insurance?  Yes Type of insurance plan: [employer]   Outpatient Pharmacy:  Prior to admission [facility] Is the patient willing to use [place] at discharge? Yes Is the patient willing to transition their outpatient pharmacy to utilize a [facility] outpatient pharmacy?   Pending    Assessment: 1. Acute on chronic systolic CHF (EF <83%). NYHA class II symptoms. - Continue torsemide 20 mg daily - Agree with starting metoprolol XL 12.5 mg daily - Agree with starting Entresto 24/26 mg BID - Restart spironolactone when BP allows, watch K - Consider starting Farxiga 10 mg daily - Continue digoxin 0.25 mg q4h x 3, then 0.125 mg daily. Level due early next week   Plan: 1) Medication changes recommended at this time: - None, agree with changes as above  2) Patient assistance: - Entresto copay $9.85 per month -  Farxiga/Jardiance copay $9.85 per month  3)  Education  - To be completed prior to discharge  Kerby Nora, PharmD, BCPS Heart Failure Cytogeneticist Phone [phone]

## 2021-10-10 NOTE — Progress Notes (Addendum)
HD#4 Subjective:  Overnight Events: NAEO   Patient feeling ok today, not having trouble with his breathing. He thinks that he continues to do better. He thinks that he will do ok if he goes home soon.  Objective:  Vital signs in last 24 hours: Vitals:   10/10/21 0730 10/10/21 0800 10/10/21 0806 10/10/21 0956  BP: 102/65 116/80 116/80   Pulse: 81 92 87   Resp: 16 17 20    Temp:      TempSrc:      SpO2: 98% 98% 98% 92%  Weight:      Height:       Supplemental O2: Room Air SpO2: 92 % O2 Flow Rate (L/min): 1 L/min   Physical Exam:  Constitutional: elderly gentleman laying in bed in NAD  Eyes: conjunctiva non-erythematous Cardiovascular: irregularly irregular rhythm, no m/r/g Pulmonary/Chest: normal work of breathing on room air, expiratory wheezing Abdominal: soft, non-distended, normal bowel sounds MSK: normal bulk and tone Neurological: alert & oriented x 3, answering questions appropriately Skin: warm and dry Psych: normal affect    Filed Weights   10/08/21 0425 10/09/21 0449 10/10/21 0400  Weight: 68.9 kg 69 kg 68.5 kg     Intake/Output Summary (Last 24 hours) at 10/10/2021 1322 Last data filed at 10/10/2021 1135 Gross per 24 hour  Intake 596 ml  Output 1570 ml  Net -974 ml   Net IO Since Admission: -1,014.63 mL [10/10/21 1322]  Pertinent Labs: CBC Latest Ref Rng & Units 10/10/2021 10/09/2021 10/09/2021  WBC 4.0 - 10.5 K/uL 7.8 - -  Hemoglobin 13.0 - 17.0 g/dL 10.6(L) 11.6(L) 11.6(L)  Hematocrit 39.0 - 52.0 % 33.0(L) 34.0(L) 34.0(L)  Platelets 150 - 400 K/uL 256 - -    CMP Latest Ref Rng & Units 10/10/2021 10/09/2021 10/09/2021  Glucose 70 - 99 mg/dL 190(H) - -  BUN 8 - 23 mg/dL 59(H) - -  Creatinine 0.61 - 1.24 mg/dL 1.25(H) - -  Sodium 135 - 145 mmol/L 136 136 135  Potassium 3.5 - 5.1 mmol/L 4.6 4.4 4.3  Chloride 98 - 111 mmol/L 98 - -  CO2 22 - 32 mmol/L 28 - -  Calcium 8.9 - 10.3 mg/dL 8.9 - -  Total Protein 6.5 - 8.1 g/dL 5.9(L) - -  Total  Bilirubin 0.3 - 1.2 mg/dL 0.7 - -  Alkaline Phos 38 - 126 U/L 168(H) - -  AST 15 - 41 U/L 59(H) - -  ALT 0 - 44 U/L 101(H) - -    Imaging: No results found.  Assessment/Plan:   Principal Problem:   Influenza A Active Problems:   HFrEF (heart failure with reduced ejection fraction) ([place])   New onset atrial fibrillation [facility])   Patient Summary: Kevin [name] is a 80 y.o. with pertinent PMH of atrial fibrillation, HFrEF s/p AICD, MI, stage IIB melanoma,  HTN, T2DM, who presented with shortness of breath and was found to be flu positive.   #Acute hypoxic failure 2/2 influenza A #COPD Patient's breathing continues to improve, no longer requiring supplemental oxygen at rest but does desat to the 80s while ambulating. Will likely need supplemental oxygen upon discharge. Once patient is ready from a cardiac perspective can likely discharge home and finish his course of prednisone and tamiflu. - continue 5 day course tamiflu end 12/2 - continue prednisone 40 daily for 5 day course - continue ventolin, dulera, proventil   #New onset atrial fibrillation #Acute on Chronic HFrEF Interrogated patient's pacemaker, he has been in  atrial fibrillation for two days which was confirmed to be new onset. Cardiology was consulted given worsened EF and new onset afib.  R/L heart cath 11/30 showed decompensated CM out of proportion to RCA occlusion. GDMT recommended. -cardiology consult appreciate assistance - CHA2DS2-VASc Score 6, HASBLED 3 - Apixaban 5mg  bid - start amio 400 mg TID - start digoxin load, 0.25 mg every 4 hours followed by 0.125 daily - start metoprolol succinate 12.5 mg daily    #T2DM - holding metformin, d/c pioglitazone - SSI   #HTN - holding spironolactone 2/2 low/normal blood pressures   #Elevated LFTs No documented history of liver disease. - daily CMP   #Anemia - daily cbc - transfuse <7   #Hyponatremia - daily bmp   #HLD - Continue home lipitor    Scarlett Presto, MD Internal Medicine Resident PGY-1 Pager [phone] Please contact the on call pager after 5 pm and on weekends at [phone].

## 2021-10-10 NOTE — Care Management Important Message (Signed)
Important Message  Patient Details  Name: Kevin [name] MRN: [id number] Date of Birth: 10/18/1941   Medicare Important Message Given:  Yes     Shelda Altes 10/10/2021, 8:41 AM

## 2021-10-10 NOTE — Progress Notes (Signed)
Ambulatory Oxygen saturation:  Room Air at rest oxygen sat = 90%  Room Air while ambulating oxygen sat = 83%  2 Liters Nasal [place] while ambulating oxygen sat = 92%

## 2021-10-10 NOTE — Progress Notes (Signed)
Subjective:  Patient seen and examined at approximately 8:15 AM Occupational Therapy was present at bedside, patient sitting up comfortably He continues to report dyspnea and fatigue.  Denies chest pain.  Intake/Output from previous day:  I/O last 3 completed shifts: In: 716 [P.O.:716] Out: 1820 [Urine:1820] Total I/O In: 120 [P.O.:120] Out: 250 [Urine:250]  Blood pressure 116/80, pulse 87, temperature 98.8 F (37.1 C), temperature source Oral, resp. rate 20, height _0  (1.727 m), weight 68.5 kg, SpO2 92 %. Physical Exam Vitals reviewed.  HENT:     Head: Normocephalic and atraumatic.  Cardiovascular:     Rate and Rhythm: Normal rate and regular rhythm.     Pulses: Decreased pulses.     Heart sounds: S1 normal and S2 normal. No murmur heard.   No gallop.  Pulmonary:     Effort: No respiratory distress.     Breath sounds: No wheezing, rhonchi or rales.     Comments: Increased work of breathing when moving in bed Musculoskeletal:     Right lower leg: No edema.     Left lower leg: No edema.  Neurological:     Mental Status: He is alert.    Lab Results: BMP BNP (last 3 results) Recent Labs    10/06/21 1731  BNP 1,252.4*    ProBNP (last 3 results) No results for input(s): PROBNP in the last 8760 hours. BMP Latest Ref Rng & Units 10/10/2021 10/09/2021 10/09/2021  Glucose 70 - 99 mg/dL 190(H) - -  BUN 8 - 23 mg/dL 59(H) - -  Creatinine 0.61 - 1.24 mg/dL 1.25(H) - -  Sodium 135 - 145 mmol/L 136 136 135  Potassium 3.5 - 5.1 mmol/L 4.6 4.4 4.3  Chloride 98 - 111 mmol/L 98 - -  CO2 22 - 32 mmol/L 28 - -  Calcium 8.9 - 10.3 mg/dL 8.9 - -   Hepatic Function Latest Ref Rng & Units 10/10/2021 10/09/2021 10/07/2021  Total Protein 6.5 - 8.1 g/dL 5.9(L) 6.2(L) 6.2(L)  Albumin 3.5 - 5.0 g/dL 2.9(L) 3.0(L) 3.1(L)  AST 15 - 41 U/L 59(H) 90(H) 76(H)  ALT 0 - 44 U/L 101(H) 108(H) 70(H)  Alk Phosphatase 38 - 126 U/L 168(H) 212(H) 92  Total Bilirubin 0.3 - 1.2 mg/dL 0.7 0.7 0.9    CBC Latest Ref Rng & Units 10/10/2021 10/09/2021 10/09/2021  WBC 4.0 - 10.5 K/uL 7.8 - -  Hemoglobin 13.0 - 17.0 g/dL 10.6(L) 11.6(L) 11.6(L)  Hematocrit 39.0 - 52.0 % 33.0(L) 34.0(L) 34.0(L)  Platelets 150 - 400 K/uL 256 - -   Lipid Panel  No results found for: CHOL, TRIG, HDL, CHOLHDL, VLDL, LDLCALC, LDLDIRECT Cardiac Panel (last 3 results) No results for input(s): CKTOTAL, CKMB, TROPONINI, RELINDX in the last 72 hours.  HEMOGLOBIN A1C Lab Results  Component Value Date   HGBA1C 6.7 (H) 10/07/2021   MPG 146 10/07/2021   TSH Recent Labs    07/16/21 1456 08/15/21 1121 10/06/21 2026  TSH 1.541 2.498 1.736   Imaging: Imaging results have been reviewed  Cardiac Studies: Echocardiogram 10/07/2021:  1. Left ventricular ejection fraction, by estimation, is <20%. The left  ventricle has severely decreased function. The left ventricle demonstrates  global hypokinesis. The left ventricular internal cavity size was  moderately dilated. Left ventricular diastolic parameters are indeterminate.   2. Right ventricular systolic function is normal. The right ventricular  size is normal. There is moderately elevated pulmonary artery systolic  pressure.   3. Left atrial size was mild to moderately dilated.  4. Small to moderate pericardial effusion surrounds heart. Most apparent  around RA, measures 13 mm in maximal dimension.   5. Mild mitral valve regurgitation.   6. Tricuspid valve regurgitation is mild to moderate.   7. Aortic valve regurgitation is mild.   8. Aortic dilatation noted. There is mild dilatation of the aortic root,  measuring 39 mm.   9. The inferior vena cava is dilated in size with <50% respiratory  variability, suggesting right atrial pressure of 15 mmHg.   Left and right heart catheterization 10/09/2021: LM: Normal LAD: [place] mid LAD stent. No significant disease Lcx: No significant disease RCA: Ostial 100% occlusion. Left-to-right collateral up to mid  RCA   RA: 11 mmHg RV: 51/0 mmHg PA: 64/22 mmHg, mPAP 34 mmHg PCW: 23 mmHg   CO: 3.6 L/min CI: 2.0 L/min/m2   Impression: Mildly decompensated nonischemic cardiomyopathy LV size and function out of proportion to RCA occlusion and patent mid LAD stent Recommend GDMT for heart failure  EKG:  10/07/2021: Afib with ventricular pacing  Telemetry:  Atrial fibrillation with RVR.  Ventricular pacing.  Scheduled Meds:  amiodarone  400 mg Oral TID   apixaban  5 mg Oral BID   atorvastatin  80 mg Oral QHS   [START ON 10/11/2021] digoxin  0.125 mg Oral Daily   digoxin  0.25 mg Oral Q4H   insulin aspart  0-20 Units Subcutaneous TID WC   insulin aspart  0-5 Units Subcutaneous QHS   magnesium oxide  400 mg Oral BID   metoprolol succinate  12.5 mg Oral Daily   mometasone-formoterol  2 puff Inhalation BID   oseltamivir  30 mg Oral BID   sacubitril-valsartan  1 tablet Oral BID   sodium chloride flush  3 mL Intravenous Q12H   torsemide  20 mg Oral Daily   Continuous Infusions:  sodium chloride     PRN Meds:.sodium chloride, acetaminophen, albuterol, guaiFENesin, ondansetron **OR** ondansetron (ZOFRAN) IV, senna-docusate, sodium chloride flush  Assessment/Plan:   Acute on chronic HFrEF: Patient with history of ischemic myopathy diagnosed in 2015, last known LVEF 25% prior to hospitalization.  Echocardiogram 09/29/2021 revealed LVEF <20% Status post dual-chamber ICD ([place]) Patient was previously followed in [facility] for cardiac care, however he has not had regular follow-up with cardiologist and is not on guideline directed medical therapy at baseline for heart failure. Left and right heart catheterization yesterday suggestive of nonischemic cardiomyopathy.  Atrial fibrillation with RVR may be contributing to decline in LVEF. Will work on improved rate and rhythm control as below. Continue low-dose beta-blocker Patient's creatinine has essentially returned to baseline, will  therefore start Entresto 24/26 mg p.o. twice daily Continue to monitor renal function closely Plan for up titration of guideline directed medical therapy as renal function and hemodynamics allow. Low-sodium diet, strict I's and O's, daily weights.   Paroxysmal atrial fibrillation: Patient has [place] ICD. CHA2DS2VAsc score 6, annual stroke risk 9% Eliquis, which she is tolerating without evidence of bleeding. Notably patient's hemoglobin is low, likely anemia of chronic disease.  However if patient has not previously had anemia work-up recommend this.  Will defer to primary team.  Given low LVEF we will avoid diltiazem use.  Patient is presently on low-dose beta-blocker therapy Patient continues to have atrial fibrillation with RVR, recommend further rate and rhythm control.  Will start amiodarone 400 mg 3 times daily Will start digoxin. Start by loading 0.25 mg every 4 hours then transition to 0.125 mg daily.  Acute  hypoxic respiratory failure: Combination of influenza A infection and acute on chronic HFrEF. Management of Influenza as per primary team  Patient was seen in collaboration with Dr. Einar Gip. He also reviewed patient's chart and examined the patient. Dr. Einar Gip is in agreement of the plan.    Alethia Berthold, PA-C 10/10/2021, 12:13 PM Office: [phone]

## 2021-10-11 ENCOUNTER — Encounter: Payer: Self-pay | Admitting: Oncology

## 2021-10-11 ENCOUNTER — Other Ambulatory Visit (HOSPITAL_COMMUNITY): Payer: Self-pay

## 2021-10-11 DIAGNOSIS — J101 Influenza due to other identified influenza virus with other respiratory manifestations: Secondary | ICD-10-CM | POA: Diagnosis not present

## 2021-10-11 LAB — BASIC METABOLIC PANEL
Anion gap: 8 (ref 5–15)
Anion gap: 6 (ref 5–15)
BUN: 53 mg/dL — ABNORMAL HIGH (ref 8–23)
BUN: 47 mg/dL — ABNORMAL HIGH (ref 8–23)
CO2: 29 mmol/L (ref 22–32)
CO2: 35 mmol/L — ABNORMAL HIGH (ref 22–32)
Calcium: 8.6 mg/dL — ABNORMAL LOW (ref 8.9–10.3)
Calcium: 8.7 mg/dL — ABNORMAL LOW (ref 8.9–10.3)
Chloride: 99 mmol/L (ref 98–111)
Chloride: 98 mmol/L (ref 98–111)
Creatinine, Ser: 1.15 mg/dL (ref 0.61–1.24)
Creatinine, Ser: 1.21 mg/dL (ref 0.61–1.24)
GFR, Estimated: >60 mL/min (ref >60)
GFR, Estimated: >60 mL/min (ref >60)
Glucose, Bld: 168 mg/dL — ABNORMAL HIGH (ref 70–99)
Glucose, Bld: 86 mg/dL (ref 70–99)
Potassium: 5.7 mmol/L — ABNORMAL HIGH (ref 3.5–5.1)
Potassium: 4 mmol/L (ref 3.5–5.1)
Sodium: 136 mmol/L (ref 135–145)
Sodium: 139 mmol/L (ref 135–145)

## 2021-10-11 LAB — GLUCOSE, CAPILLARY
Glucose-Capillary: 186 mg/dL — ABNORMAL HIGH (ref 70–99)
Glucose-Capillary: 109 mg/dL — ABNORMAL HIGH (ref 70–99)
Glucose-Capillary: 303 mg/dL — ABNORMAL HIGH (ref 70–99)

## 2021-10-11 MED ORDER — SACUBITRIL-VALSARTAN 24-26 MG PO TABS
1.0000 | ORAL_TABLET | Freq: Two times a day (BID) | ORAL | 0 refills | Status: DC
  Filled 2021-10-11: qty 60, 30d supply, fill #0

## 2021-10-11 MED ORDER — DAPAGLIFLOZIN PROPANEDIOL 5 MG PO TABS
5.0000 mg | ORAL_TABLET | Freq: Every day | ORAL | Status: DC
  Filled 2021-10-11 (×2): qty 1

## 2021-10-11 MED ORDER — TORSEMIDE 20 MG PO TABS
20.0000 mg | ORAL_TABLET | Freq: Every day | ORAL | 0 refills | Status: DC
Start: 2021-10-12 — End: 2021-10-21
  Filled 2021-10-11: qty 30, 30d supply, fill #0

## 2021-10-11 MED ORDER — AMIODARONE HCL 200 MG PO TABS
200.0000 mg | ORAL_TABLET | Freq: Two times a day (BID) | ORAL | 0 refills | Status: DC
Start: 2021-10-11 — End: 2021-10-25
  Filled 2021-10-11: qty 60, 30d supply, fill #0

## 2021-10-11 MED ORDER — DIGOXIN 125 MCG PO TABS
0.1250 mg | ORAL_TABLET | Freq: Every day | ORAL | 0 refills | Status: DC
  Filled 2021-10-11: qty 30, 30d supply, fill #0

## 2021-10-11 MED ORDER — AMIODARONE HCL 200 MG PO TABS: 200.0000 mg | ORAL_TABLET | Freq: Two times a day (BID) | ORAL | Status: DC

## 2021-10-11 MED ORDER — SACUBITRIL-VALSARTAN 24-26 MG PO TABS
1.0000 | ORAL_TABLET | Freq: Two times a day (BID) | ORAL | Status: DC
  Administered 2021-10-11: 1 via ORAL
  Filled 2021-10-11: qty 1

## 2021-10-11 MED ORDER — DAPAGLIFLOZIN PROPANEDIOL 10 MG PO TABS
10.0000 mg | ORAL_TABLET | Freq: Every day | ORAL | Status: DC
  Administered 2021-10-11: 10 mg via ORAL
  Filled 2021-10-11: qty 1

## 2021-10-11 MED ORDER — OSELTAMIVIR PHOSPHATE 30 MG PO CAPS
30.0000 mg | ORAL_CAPSULE | Freq: Two times a day (BID) | ORAL | Status: DC
  Filled 2021-10-11: qty 1

## 2021-10-11 MED ORDER — METOPROLOL SUCCINATE ER 25 MG PO TB24
12.5000 mg | ORAL_TABLET | Freq: Every day | ORAL | 0 refills | Status: DC
Start: 2021-10-12 — End: 2021-12-20
  Filled 2021-10-11: qty 15, 30d supply, fill #0

## 2021-10-11 MED ORDER — EMPAGLIFLOZIN 10 MG PO TABS
10.0000 mg | ORAL_TABLET | Freq: Every day | ORAL | 0 refills | Status: DC
  Filled 2021-10-11: qty 30, 30d supply, fill #0

## 2021-10-11 MED ORDER — APIXABAN 5 MG PO TABS
5.0000 mg | ORAL_TABLET | Freq: Two times a day (BID) | ORAL | 0 refills | Status: DC
  Filled 2021-10-11: qty 60, 30d supply, fill #0

## 2021-10-11 NOTE — TOC Transition Note (Signed)
Transition of Care [facility]) - CM/SW Discharge Note   Patient Details  Name: Kevin [name] MRN: [id number] Date of Birth: August 02, 1941  Transition of Care [facility] CM/SW Contact:  Zenon Mayo, RN Phone Number: 10/11/2021, 2:57 PM   Clinical Narrative:     NCM spoke  with patient he states to call his daguhter, NCM spoke with daughter, Kevin [name], offered choice, she states she does not have a preference,  for the [facility] agency or the DME company,  NCM made referral to [facility] with [employer] for home oxygen and 3 n 1 , rolling walker.  NCM made referral to [facility] with [facility] for Acute [facility] [place], [place]. He is able to take referral.  Soc will begin 24 to 48 hrs post dc. Kevin [name] states she will transport patient home at discharge.    Final next level of care: [place] Barriers to Discharge: No Barriers Identified   Patient Goals and CMS Choice Patient states their goals for this hospitalization and ongoing recovery are:: return home with Home Health CMS Medicare.gov Compare Post Acute Care list provided to:: Patient Represenative (must comment) Choice offered to / list presented to : Adult Children  Discharge Placement                       Discharge Plan and Services                DME Arranged: Oxygen, Walker rolling, 3-N-1 DME Agency: [employer] Date DME Agency Contacted: 10/11/21 Time DME Agency Contacted: [phone] Representative spoke with at DME Agency: Thedore Mins HH Arranged: RN, PT, OT [facility] Agency: [place] Date [place]: 10/11/21 Time [place]: 1456 Representative spoke with at [place]: [place] Interventions     Readmission Risk Interventions No flowsheet data found.

## 2021-10-11 NOTE — Progress Notes (Signed)
Subjective:  Patient seen and examined at approximately 8:00 AM  Patent resting comfortable. He ambulated in the hall yesterday. Reports fatigue, but no chest pain, palpitations, dizziness, or significant dyspnea.  Patient remains in underlying atrial fibrillation, rate control is much improved.  He is euvolemic on exam. Tolerating anticoagulation without evidence of bleeding  Net negative 1.2 L  Patient renal function continued to improve today, however he developed hyperkalemia with addition of Entresto. Potassium today 5.7, although sample had hemolyzed.   Intake/Output from previous day:  I/O last 3 completed shifts: In: 1080 [P.O.:1080] Out: 2520 [Urine:2520] No intake/output data recorded.  Blood pressure (!) 112/54, pulse 79, temperature 98.2 F (36.8 C), temperature source Oral, resp. rate 20, height '5\' 8"'  (1.727 m), weight 66.5 kg, SpO2 94 %. Physical Exam Vitals reviewed.  HENT:     Head: Normocephalic and atraumatic.  Cardiovascular:     Rate and Rhythm: Normal rate and regular rhythm.     Pulses: Decreased pulses.     Heart sounds: S1 normal and S2 normal. No murmur heard.   No gallop.  Pulmonary:     Effort: Pulmonary effort is normal. No respiratory distress.     Breath sounds: No wheezing, rhonchi or rales.     Comments: On 2 L via [place] Musculoskeletal:     Right lower leg: No edema.     Left lower leg: No edema.  Neurological:     Mental Status: He is alert and oriented to person, place, and time.    Lab Results: BMP BNP (last 3 results) Recent Labs    10/06/21 1731  BNP 1,252.4*     ProBNP (last 3 results) No results for input(s): PROBNP in the last 8760 hours. BMP Latest Ref Rng & Units 10/11/2021 10/10/2021 10/09/2021  Glucose 70 - 99 mg/dL 168(H) 190(H) -  BUN 8 - 23 mg/dL 53(H) 59(H) -  Creatinine 0.61 - 1.24 mg/dL 1.15 1.25(H) -  Sodium 135 - 145 mmol/L 136 136 136  Potassium 3.5 - 5.1 mmol/L 5.7(H) 4.6 4.4  Chloride 98 - 111 mmol/L 99 98 -   CO2 22 - 32 mmol/L 29 28 -  Calcium 8.9 - 10.3 mg/dL 8.6(L) 8.9 -   Hepatic Function Latest Ref Rng & Units 10/10/2021 10/09/2021 10/07/2021  Total Protein 6.5 - 8.1 g/dL 5.9(L) 6.2(L) 6.2(L)  Albumin 3.5 - 5.0 g/dL 2.9(L) 3.0(L) 3.1(L)  AST 15 - 41 U/L 59(H) 90(H) 76(H)  ALT 0 - 44 U/L 101(H) 108(H) 70(H)  Alk Phosphatase 38 - 126 U/L 168(H) 212(H) 92  Total Bilirubin 0.3 - 1.2 mg/dL 0.7 0.7 0.9   CBC Latest Ref Rng & Units 10/10/2021 10/09/2021 10/09/2021  WBC 4.0 - 10.5 K/uL 7.8 - -  Hemoglobin 13.0 - 17.0 g/dL 10.6(L) 11.6(L) 11.6(L)  Hematocrit 39.0 - 52.0 % 33.0(L) 34.0(L) 34.0(L)  Platelets 150 - 400 K/uL 256 - -   Lipid Panel  No results found for: CHOL, TRIG, HDL, CHOLHDL, VLDL, LDLCALC, LDLDIRECT Cardiac Panel (last 3 results) No results for input(s): CKTOTAL, CKMB, TROPONINI, RELINDX in the last 72 hours.  HEMOGLOBIN A1C Lab Results  Component Value Date   HGBA1C 6.7 (H) 10/07/2021   MPG 146 10/07/2021   TSH Recent Labs    07/16/21 1456 08/15/21 1121 10/06/21 2026  TSH 1.541 2.498 1.736    Imaging: Imaging results have been reviewed  Cardiac Studies: Echocardiogram 10/07/2021:  1. Left ventricular ejection fraction, by estimation, is <20%. The left  ventricle has severely decreased function.  The left ventricle demonstrates  global hypokinesis. The left ventricular internal cavity size was  moderately dilated. Left ventricular diastolic parameters are indeterminate.   2. Right ventricular systolic function is normal. The right ventricular  size is normal. There is moderately elevated pulmonary artery systolic  pressure.   3. Left atrial size was mild to moderately dilated.   4. Small to moderate pericardial effusion surrounds heart. Most apparent  around RA, measures 13 mm in maximal dimension.   5. Mild mitral valve regurgitation.   6. Tricuspid valve regurgitation is mild to moderate.   7. Aortic valve regurgitation is mild.   8. Aortic dilatation  noted. There is mild dilatation of the aortic root,  measuring 39 mm.   9. The inferior vena cava is dilated in size with <50% respiratory  variability, suggesting right atrial pressure of 15 mmHg.   Left and right heart catheterization 10/09/2021: LM: Normal LAD: [place] mid LAD stent. No significant disease Lcx: No significant disease RCA: Ostial 100% occlusion. Left-to-right collateral up to mid RCA   RA: 11 mmHg RV: 51/0 mmHg PA: 64/22 mmHg, mPAP 34 mmHg PCW: 23 mmHg   CO: 3.6 L/min CI: 2.0 L/min/m2   Impression: Mildly decompensated nonischemic cardiomyopathy LV size and function out of proportion to RCA occlusion and patent mid LAD stent Recommend GDMT for heart failure  EKG:  10/07/2021: Afib with ventricular pacing  Telemetry:  Atrial fibrillation with controlled ventricular response.  Ventricular pacing.  Scheduled Meds:  amiodarone  400 mg Oral TID   apixaban  5 mg Oral BID   atorvastatin  80 mg Oral QHS   dapagliflozin propanediol  5 mg Oral Daily   digoxin  0.125 mg Oral Daily   insulin aspart  0-20 Units Subcutaneous TID WC   insulin aspart  0-5 Units Subcutaneous QHS   magnesium oxide  400 mg Oral BID   metoprolol succinate  12.5 mg Oral Daily   mometasone-formoterol  2 puff Inhalation BID   oseltamivir  30 mg Oral BID   sacubitril-valsartan  1 tablet Oral BID   sodium chloride flush  3 mL Intravenous Q12H   torsemide  20 mg Oral Daily   Continuous Infusions:  sodium chloride     PRN Meds:.sodium chloride, acetaminophen, albuterol, guaiFENesin, ondansetron **OR** ondansetron (ZOFRAN) IV, senna-docusate, sodium chloride flush  Assessment/Plan:   Acute on chronic HFrEF: Patient with history of ischemic and likely nonischemic cardiomyopathy diagnosed in 2015, last known LVEF 25% prior to hospitalization.  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 However he developed hyperkalemia, although sample had hemolyzed. Will therefore repeat BMP to reevaluate hyperkalemia.  Will hold Entresto for now, pending repeat BMP.  If patient's potassium is normal on repeat BMP can continue Entresto.  Agree with starting Farxiga Continue to monitor BMP closely Plan for up titration of guideline directed medical therapy as renal function and hemodynamics allow. Low-sodium diet, strict I's and O's, daily weights.   Paroxysmal atrial fibrillation: Patient has [place] ICD. CHA2DS2VAsc score 6, annual stroke risk 9% Eliquis, which he is tolerating without evidence of bleeding. Monitor CBC.  Now well rate controlled Continue digoxin  Reduce amiodarone to 200 mg p.o. BID Continue low-dose beta-blocker   Acute hypoxic respiratory failure: Improving Likely multifactorial including influenza A infection and acute on chronic HFrEF. Management of influenza per primary team  Patient is stable for discharge from cardiac standpoint if potassium is normal pending repeat BMP.   Patient was seen in collaboration with Dr. Einar Gip. He also reviewed patient's chart and examined the patient. Dr. Einar Gip is in agreement of the plan.    Alethia Berthold, PA-C 10/11/2021, 7:42 AM Office: [phone]

## 2021-10-11 NOTE — Progress Notes (Signed)
Heart Failure Stewardship Pharmacist Progress Note   PCP: [name], PA-C PCP-Cardiologist: None    HPI:  80 yo M with PMH of afib s/p PPM, MI, stage IIb melanoma, CHF, HTN, and T2DM. He presented to the ED on 11/27 with shortness of breath. CXR with mildly progressive cardiomegaly without acute cardiopulmonary process. An ECHO was done on 11/28 and found to have severe LV dysfunction, <20% (20-25% in 2018).  Current HF Medications: Diuretic: torsemide 20 mg daily Beta Blocker: metoprolol XL 12.5 mg daily SGLT2i: Farxiga 5 mg daily Other: digoxin 0.25 mg 0.125 mg daily  Prior to admission HF Medications: Diuretic: furosemide 40 mg daily Aldosterone Antagonist: spironolactone 25 mg daily  Pertinent Lab Values: Serum creatinine 1.15, BUN 53, Potassium 5.7 (hemolysis), Sodium 136, BNP 1252.4, Magnesium 2.6, A1c 6.7  Vital Signs: Weight: 146 lbs (admission weight: 152 lbs) Blood pressure: 110/50s  Heart rate: 70s  I/O: -1L yesterday; net -1.2L  Medication Assistance / Insurance Benefits Check: Does the patient have prescription insurance?  Yes Type of insurance plan: [employer]   Outpatient Pharmacy:  Prior to admission outpatient pharmacy: [facility] Is the patient willing to use [facility] at discharge? Yes Is the patient willing to transition their outpatient pharmacy to utilize a [facility] outpatient pharmacy?   Pending    Assessment: 1. Acute on chronic systolic CHF (EF <07%). NYHA class II symptoms. - Continue torsemide 20 mg daily - Continue metoprolol XL 12.5 mg daily - Off Entresto 24/26 mg BID with elevated potassium, however the specimen hemolyzed and this falsely elevates the lab value. Consider repeating BMET and restarting Entresto.  - Restart spironolactone when BP allows, watch K - Agree with starting Wilder Glade but would increase to 10 mg daily for HF - Continue digoxin 0.125 mg daily. Level due early next week   Plan: 1) Medication changes  recommended at this time: - Repeat BMET and may be able to resume Entresto - Increase Farxiga to 10 mg daily  2) Patient assistance: - Entresto copay $9.85 per month - Farxiga/Jardiance copay $9.85 per month  3)  Education  - To be completed prior to discharge  Kerby Nora, PharmD, BCPS Heart Failure Cytogeneticist Phone [phone]

## 2021-10-11 NOTE — Progress Notes (Signed)
Discharge instructions provided to patient and patients daughter. All medications, follow up appointments, and discharge instructions provided. IV out. Monitor off. CCMD notified. Discharging to home with family. Era Bumpers, RN

## 2021-10-13 ENCOUNTER — Emergency Department (HOSPITAL_COMMUNITY)
Admission: EM | Admit: 2021-10-13 | Discharge: 2021-10-14 | Disposition: A | Discharge status: Home / Self Care | Payer: Medicare PPO | Attending: Emergency Medicine | Admitting: Emergency Medicine

## 2021-10-13 ENCOUNTER — Other Ambulatory Visit: Payer: Self-pay

## 2021-10-13 ENCOUNTER — Emergency Department (HOSPITAL_COMMUNITY): Payer: Medicare PPO

## 2021-10-13 ENCOUNTER — Encounter (HOSPITAL_COMMUNITY): Payer: Self-pay | Admitting: Emergency Medicine

## 2021-10-13 DIAGNOSIS — R0902 Hypoxemia: Secondary | ICD-10-CM | POA: Diagnosis not present

## 2021-10-13 DIAGNOSIS — Z5321 Procedure and treatment not carried out due to patient leaving prior to being seen by health care provider: Secondary | ICD-10-CM | POA: Insufficient documentation

## 2021-10-13 DIAGNOSIS — R739 Hyperglycemia, unspecified: Secondary | ICD-10-CM | POA: Diagnosis not present

## 2021-10-13 DIAGNOSIS — I959 Hypotension, unspecified: Secondary | ICD-10-CM | POA: Diagnosis not present

## 2021-10-13 DIAGNOSIS — E1165 Type 2 diabetes mellitus with hyperglycemia: Secondary | ICD-10-CM | POA: Diagnosis not present

## 2021-10-13 DIAGNOSIS — I517 Cardiomegaly: Secondary | ICD-10-CM | POA: Diagnosis not present

## 2021-10-13 LAB — COMPREHENSIVE METABOLIC PANEL
ALT: 36 U/L (ref 0–44)
AST: 15 U/L (ref 15–41)
Albumin: 3 g/dL — ABNORMAL LOW (ref 3.5–5.0)
Alkaline Phosphatase: 118 U/L (ref 38–126)
Anion gap: 11 (ref 5–15)
BUN: 44 mg/dL — ABNORMAL HIGH (ref 8–23)
CO2: 32 mmol/L (ref 22–32)
Calcium: 8.9 mg/dL (ref 8.9–10.3)
Chloride: 94 mmol/L — ABNORMAL LOW (ref 98–111)
Creatinine, Ser: 1.44 mg/dL — ABNORMAL HIGH (ref 0.61–1.24)
GFR, Estimated: 49 mL/min — ABNORMAL LOW (ref >60)
Glucose, Bld: 252 mg/dL — ABNORMAL HIGH (ref 70–99)
Potassium: 4.6 mmol/L (ref 3.5–5.1)
Sodium: 137 mmol/L (ref 135–145)
Total Bilirubin: 1 mg/dL (ref 0.3–1.2)
Total Protein: 6.6 g/dL (ref 6.5–8.1)

## 2021-10-13 LAB — URINALYSIS, ROUTINE W REFLEX MICROSCOPIC
Bilirubin Urine: NEGATIVE
Glucose, UA: >=500 mg/dL — AB
Ketones, ur: NEGATIVE mg/dL
Nitrite: NEGATIVE
Protein, ur: NEGATIVE mg/dL
Specific Gravity, Urine: 1.016 (ref 1.005–1.030)
pH: 5 (ref 5.0–8.0)

## 2021-10-13 LAB — CBC
HCT: 42.6 % (ref 39.0–52.0)
Hemoglobin: 13.9 g/dL (ref 13.0–17.0)
MCH: 30.3 pg (ref 26.0–34.0)
MCHC: 32.6 g/dL (ref 30.0–36.0)
MCV: 92.8 fL (ref 80.0–100.0)
Platelets: 401 10*3/uL — ABNORMAL HIGH (ref 150–400)
RBC: 4.59 MIL/uL (ref 4.22–5.81)
RDW: 13.8 % (ref 11.5–15.5)
WBC: 10.4 10*3/uL (ref 4.0–10.5)
nRBC: 0 % (ref 0.0–0.2)

## 2021-10-13 LAB — BETA-HYDROXYBUTYRIC ACID: Beta-Hydroxybutyric Acid: 0.51 mmol/L — ABNORMAL HIGH (ref 0.05–0.27)

## 2021-10-13 NOTE — ED Provider Notes (Signed)
Emergency Medicine Provider Triage Evaluation Note  Kevin [name] , a 80 y.o. male  was evaluated in triage.  Patient was recently diagnosed with the flu on December 2.  Apparently has been noncompliant with his medications at home and was not acting like himself today.  He was brought here by EMS.  Blood sugar on the way here was 333.  He presents on 2-1/2 L of nasal cannula which is his home number.  When asked, patient does not know why he is here other than that his daughter made him come.  Review of Systems  Positive: See above Negative:   Physical Exam  BP 98/61   Pulse 90   Temp 97.6 F (36.4 C)   Resp 16   SpO2 97%  Gen:   Awake, no distress   Resp:  Normal effort  MSK:   Moves extremities without difficulty  Other:  Lungs CTA bilaterally  Medical Decision Making  Medically screening exam initiated at 9:23 PM.  Appropriate orders placed.  Kevin [name] was informed that the remainder of the evaluation will be completed by another provider, this initial triage assessment does not replace that evaluation, and the importance of remaining in the ED until their evaluation is complete.     Sheila Oats 10/13/21 2124    Carmin Muskrat, MD 10/15/21 [phone]

## 2021-10-13 NOTE — ED Triage Notes (Addendum)
BIB [facility]   Discharged on the 2nd with positive flu.  Not himself today. Non compliant with meds at home. No pain. No injuries.  CBG 333.  Wears 2.5 L [place] at home continually.

## 2021-10-14 NOTE — ED Notes (Signed)
Pt and family embers upset and want to leave.

## 2021-10-14 NOTE — Progress Notes (Incomplete)
[name]  [address] [place],  [place]  [address] [phone]  Clinic Day:  10/14/2021  Referring physician: [name], PA-C  This document serves as a record of services personally performed by Kevin Poisson, MD. It was created on their behalf by [name], a trained medical scribe. The creation of this record is based on the scribe's personal observations and the provider's statements to them.  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 CT neck and chest in June did not reveal any evidence of progressive disease. We will repeat imaging again in December.  Macrocytic anemia Macrocytic anemia, despite monthly B12 injections. This is improved today.   Plan: He will proceed with nivolumab this week. We will plan to see him back in 4 weeks with a CBC, comprehensive metabolic panel, magnesium and TSH prior to his next cycle. We will repeat CT imaging towards the end of December. The patient understands the plans discussed today and is in agreement with them.  They know to contact our office if he develops concerns prior to his next appointment.  I provided 15 minutes of face-to-face time during this this encounter and > 50% was spent counseling as documented under my assessment and plan.    No orders of the defined types were placed in this encounter.     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  His original lesion was diagnosed in March 2016.  He underwent excision, but refused aggressive surgery.  The lesion was at least 4 mm thick with a Clark's level 4, and a mitotic index 10/mm.  The margins were positive, but wide reexcision revealed no residual melanoma.  He had  recurrence within 2 months and the lesion had been steadily growing.  He initially had refused seeing a medical oncologist, but finally came to [place] for evaluation and treatment in October 2016.  He does have extensive comorbidities including COPD, diabetes, hypertension, coronary artery disease, history of myocardial infarction, and pacemaker with AICD.  PET revealed hypermetabolism in the right submandibular node, but no evidence of distant metastasis.  He was having significant pain and requiring regular hydrocodone doses.  He also had severe disfigurement and avoided going out in public because of the facial lesion.  He has been receiving palliative nivolumab since November 2016 and has had a good response with a decrease in the right facial tumor.  He has had associated hypomagnesemia and hypokalemia, so is on oral magnesium and potassium supplements. He has also intermittently required IV magnesium and potassium supplementation.  He has had mild chronic anemia, which has been stable. He was found to have B12 deficiency and continues B12 injections monthly.  CT head/neck in May 2017 revealed some nonspecific skin thickening in the right face in the area of his melanoma in the right submandibular lymph node had decreased in size, now measuring 6 x 12 mm, but still prominent when compared to the left submandibular node.  He had 15 cycles of nivolumab and relocated to continue to get the same treatment.  He had his 30th dose in February 2018 and then returned to the [name].     However, [name] for congestive heart failure associated with  pleural and pericardial effusions.  He also had changes in the lung and it was unclear as to how much of this was fluid overload versus infection versus pneumonitis from the immunotherapy.  He had no leukocytosis, fever or purulent sputum.  The echocardiogram revealed severe diffuse hypokinesis with mild concentric hypertrophy  and markedly depressed ejection fraction of 20 to 25%, associated with moderate mitral regurgitation and moderate left atrial dilatation.  He improved with diuresis.  The pleural effusions were felt to be most likely secondary to congestive heart failure.  He was also placed on corticosteroids due to the possibility of side effects of immunotherapy, but these were subsequently discontinued.  CT chest in March 2018 revealed stable cardiomegaly and stable small pericardial effusion, but no evidence of immune mediated pneumonitis or metastatic disease.  The previously seen bilateral pleural effusions had resolved.  He returned to our care and nivolumab every 2 weeks was resumed in April.  CT chest was repeated in early May after 1 month back on therapy.  This was stable, without evidence of recurrent pleural effusions, immunotherapy toxicities, or evidence of metastatic disease.  He was switched to every 28-day nivolumab in August 2018.  He has had stable disease so has continued on nivolumab.  He remains on both furosemide and spironolactone.  CT imaging in October 2018 revealed a stable 5 mm level 1B cervical lymph node, as well as a stable 8 mm exophytic lesion of the right face.  CT chest did not reveal any evidence of metastatic disease. Borderline cardiomegaly and a small amount of pericardial fluid/thickening was seen, but not reaccumulation of the pleural effusions.  CT head did not reveal any evidence of intracranial metastasis.  There were chronic ischemic changes and evidence of old infarcts.  He has continued palliative nivolumab.   CT neck and chest in February 2019 were stable.  He was admitted to [name] in May 2019 twice with severe hypoglycemia.  Repeat imaging in May did not reveal any progressive disease.  His nivolumab was held in May, but resumed at the end of June.  He did not have a dose in 06-04-23.  Unfortunately, his wife passed away in 06/03/2018 and she was his primary caregiver.  His  daughter has been caring for him since.  CT neck and chest in November 2019 did not reveal any evidence of recurrent or metastatic disease.  CT imaging in January 2020 did not reveal evidence of recurrence, so he has continued nivolumab every 4 weeks.  He was given mirtazapine for depression and weight loss the dose was increased to 15 mg.  CT scans in June 2020 remained stable with a small pericardial effusion and bilateral renal cysts. CT neck, chest and abdomen in November 2020 did not reveal any evidence of metastasis.     CT head, neck and chest from May and October 2021 were negative for evidence of metastatic disease.  He was then lost to follow-up from December 2021 to April 2022, at which time he resumed nivolumab and B12 every 4 weeks.   CT head, neck and chest in June of 2022 did not reveal any evidence of progressive disease.  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 ([place])  01/24/2015 Cancer Staging   Staging form: Melanoma of the Skin, AJCC 8th Edition - Clinical stage from 01/24/2015: Stage IIB (cT3b, cN0, cM0) - Signed by [name], MD on 08/19/2021 Histopathologic type: Malignant melanoma, NOS (except juvenile melanoma [id number]) Stage prefix:  Initial diagnosis Laterality: Right Lymph-vascular invasion (LVI): LVI not present (absent)/not identified Diagnostic confirmation: Positive histology Specimen type: Excision Staged by: Managing physician Mitotic count: 10 Mitotic unit: mm2 Clark's level: Level IV Tumor-infiltrating lymphocytes: Unknown Neurotropism: Absent Presence of extranodal extension: Absent Breslow depth (mm): 40 Ulceration of the epidermis: Yes Microsatellites: No Primary tumor regression: Unknown Sentinel lymph node biopsy performed: No Matted nodes: No Prognostic indicators: Wide excision negative Stage used in treatment planning: Yes National guidelines used in treatment planning: Yes Type of national guideline used in  treatment planning: NCCN   07/18/2020 -  Chemotherapy   Patient is on Treatment Plan : MELANOMA Nivolumab + B12 q28d     07/29/2020 Initial Diagnosis   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 ([place])     INTERVAL HISTORY:  [name] is here for routine follow up prior to another cycle of nivolumab. He states that he has been well and denies complaints. His breathing has improved since resuming his Lasix. He continues to follow with cardiology. He continues to smoke tobacco. Hemoglobin has improved from 11.3 to 12.0, and white count and platelets are normal. Chemistries are unremarkable except for a BUN of 23, and a creatinine of 1.4, previously 1.1. I advised that he push fluids. His  appetite is good, and he has lost 4 pounds since his last visit (he had gained 5 pounds last month, but got back on his diuretics).  He denies fever, chills or other signs of infection.  He denies nausea, vomiting, bowel issues, or abdominal pain.  He denies sore throat, cough, dyspnea, or chest pain.   [facility] is here for routine follow up prior to another cycle of nivolumab.   His  appetite is good, and he has gained/lost _ pounds since his last visit.  He denies fever, chills or other signs of infection.  He denies nausea, vomiting, bowel issues, or abdominal pain.  He denies sore throat, cough, dyspnea, or chest pain.  REVIEW OF SYSTEMS:  Review of Systems  Constitutional: Negative.  Negative for appetite change, chills, fatigue, fever and unexpected weight change.  HENT:  Negative.    Eyes: Negative.   Respiratory: Negative.  Negative for chest tightness, cough, hemoptysis, shortness of breath and wheezing.   Cardiovascular: Negative.  Negative for chest pain, leg swelling and palpitations.  Gastrointestinal: Negative.  Negative for abdominal distention, abdominal pain, blood in stool, constipation, diarrhea, nausea and vomiting.  Endocrine: Negative.   Genitourinary: Negative.  Negative for difficulty urinating,  dysuria, frequency and hematuria.   Musculoskeletal: Negative.  Negative for arthralgias, back pain, flank pain, gait problem and myalgias.  Skin: Negative.   Neurological: Negative.  Negative for dizziness, extremity weakness, gait problem, headaches, light-headedness, numbness, seizures and speech difficulty.  Hematological: Negative.   Psychiatric/Behavioral: Negative.  Negative for depression and sleep disturbance. The patient is not nervous/anxious.   All other systems reviewed and are negative.   VITALS:  There were no vitals taken for this visit.  Wt Readings from Last 3 Encounters:  10/11/21 146 lb 8 oz (66.5 kg)  09/25/21 160 lb 1.3 oz (72.6 kg)  09/20/21 158 lb (71.7 kg)    There is no height or weight on file to calculate BMI.  Performance status (ECOG): 0 - Asymptomatic  PHYSICAL EXAM:  Physical Exam Constitutional:      General: He is not in acute distress.    Appearance: Normal appearance. He is normal weight.  HENT:     Head: Normocephalic and atraumatic.  Eyes:     General: No scleral icterus.    Extraocular Movements: Extraocular movements intact.     Conjunctiva/sclera: Conjunctivae normal.     Pupils: Pupils are equal, round, and reactive to light.  Cardiovascular:     Rate and Rhythm: Normal rate and regular rhythm.     Pulses: Normal pulses.     Heart sounds: Normal heart sounds. No murmur heard.   No friction rub. No gallop.  Pulmonary:     Effort: Pulmonary effort is normal. No respiratory distress.     Breath sounds: Normal breath sounds.  Abdominal:     General: Bowel sounds are normal. There is no distension.     Palpations: Abdomen is soft. There is no hepatomegaly, splenomegaly or mass.     Tenderness: There is no abdominal tenderness.  Musculoskeletal:        General: Normal range of motion.     Cervical back: Normal range of motion and neck supple.     Right lower leg: No edema.     Left lower leg: No edema.  Lymphadenopathy:      Cervical: No cervical adenopathy.  Skin:    General: Skin is warm and dry.  Neurological:     General: No focal deficit present.     Mental Status: He is alert and oriented to person, place, and time. Mental status is at baseline.  Psychiatric:        Mood and Affect: Mood normal.        Behavior: Behavior normal.        Thought Content: Thought content normal.        Judgment: Judgment normal.    LABS:   CBC Latest Ref Rng & Units 10/13/2021 10/10/2021 10/09/2021  WBC 4.0 - 10.5 K/uL 10.4 7.8 -  Hemoglobin 13.0 - 17.0 g/dL 13.9 10.6(L) 11.6(L)  Hematocrit 39.0 - 52.0 % 42.6 33.0(L) 34.0(L)  Platelets 150 - 400 K/uL 401(H) 256 -   CMP Latest Ref Rng & Units 10/13/2021 10/11/2021 10/11/2021  Glucose 70 - 99 mg/dL 252(H) 86 168(H)  BUN 8 - 23 mg/dL 44(H) 47(H) 53(H)  Creatinine 0.61 - 1.24 mg/dL 1.44(H) 1.21 1.15  Sodium 135 - 145 mmol/L 137 139 136  Potassium 3.5 - 5.1 mmol/L 4.6 4.0 5.7(H)  Chloride 98 - 111 mmol/L 94(L) 98 99  CO2 22 - 32 mmol/L 32 35(H) 29  Calcium 8.9 - 10.3 mg/dL 8.9 8.7(L) 8.6(L)  Total Protein 6.5 - 8.1 g/dL 6.6 - -  Total Bilirubin 0.3 - 1.2 mg/dL 1.0 - -  Alkaline Phos 38 - 126 U/L 118 - -  AST 15 - 41 U/L 15 - -  ALT 0 - 44 U/L 36 - -    STUDIES:  DG Chest 2 View  Result Date: 10/13/2021 CLINICAL DATA:  Positive flu EXAM: CHEST - 2 VIEW COMPARISON:  October 06, 2021 FINDINGS: Unchanged position of the right chest Port-A-Cath and left chest AICD/pacemaker. Stable mild cardiomegaly. No focal airspace consolidation. No pleural effusion or pneumothorax. The postsurgical change are present at the right shoulder with chronically fractured cerclage wire. IMPRESSION: No active cardiopulmonary disease. Electronically Signed   By: Dahlia Bailiff M.D.   On: 10/13/2021 22:54   DG Chest 2 View  Result Date: 10/06/2021 CLINICAL DATA:  Shortness of breath with cough and congestion for several days. EXAM: CHEST - 2 VIEW COMPARISON:  Radiographs 01/04/2018.  CT  04/30/2021. FINDINGS: Right IJ Port-A-Cath appears unchanged, extending to the level of the  mid SVC. Left subclavian pacemaker leads appear unchanged. Mildly progressive cardiomegaly from 2019. The pulmonary vascularity appears normal, and there is no edema, confluent airspace opacity, pleural effusion or pneumothorax. No acute osseous findings are evident. Postsurgical changes are present at the right shoulder with a chronically fractured cerclage wire. IMPRESSION: Mildly progressive cardiomegaly without evidence of acute cardiopulmonary process. Electronically Signed   By: Richardean Sale M.D.   On: 10/06/2021 16:35   CARDIAC CATHETERIZATION  Result Date: 10/09/2021 Images from the original result were not included. LM: Normal LAD: [place] mid LAD stent. No significant disease Lcx: No significant disease RCA: Ostial 100% occlusion. Left-to-right collateral up to mid RCA RA: 11 mmHg RV: 51/0 mmHg PA: 64/22 mmHg, mPAP 34 mmHg PCW: 23 mmHg CO: 3.6 L/min CI: 2.0 L/min/m2 Impression: Mildly decompensated nonischemic cardiomyopathy LV size and function out of proportion to RCA occlusion and patent mid LAD stent Recommend GDMT for heart failure Nigel Mormon, MD Pager: [phone] Office: [phone]   ECHOCARDIOGRAM COMPLETE  Result Date: 10/07/2021    ECHOCARDIOGRAM REPORT   Patient Name:   [name] Date of Exam: 10/07/2021 Medical Rec #:  [id number]          Height:       68.0 in Accession #:    [id number]         Weight:       152.6 lb Date of Birth:  January 15, 1941          BSA:          1.822 m Patient Age:    89 years           BP:           93/72 mmHg Patient Gender: M                  HR:           75 bpm. Exam Location:  Inpatient Procedure: 2D Echo Indications:    dyspnea  History:        Patient has no prior history of Echocardiogram examinations.                 Signs/Symptoms:Influenza; Risk Factors:Diabetes and                 Hypertension.  Sonographer:    Johny Chess RDCS  Referring Phys: [id number] NISCHAL NARENDRA IMPRESSIONS  1. Left ventricular ejection fraction, by estimation, is <20%. The left ventricle has severely decreased function. The left ventricle demonstrates global hypokinesis. The left ventricular internal cavity size was moderately dilated. Left ventricular diastolic parameters are indeterminate.  2. Right ventricular systolic function is normal. The right ventricular size is normal. There is moderately elevated pulmonary artery systolic pressure.  3. Left atrial size was mild to moderately dilated.  4. Small to moderate pericardial effusion surrounds heart. Most apparent around RA, measures 13 mm in maximal dimension.  5. Mild mitral valve regurgitation.  6. Tricuspid valve regurgitation is mild to moderate.  7. Aortic valve regurgitation is mild.  8. Aortic dilatation noted. There is mild dilatation of the aortic root, measuring 39 mm.  9. The inferior vena cava is dilated in size with <50% respiratory variability, suggesting right atrial pressure of 15 mmHg. FINDINGS  Left Ventricle: Left ventricular ejection fraction, by estimation, is <20%. The left ventricle has severely decreased function. The left ventricle demonstrates global hypokinesis. The left ventricular internal cavity size was moderately dilated. There is no left ventricular hypertrophy. Left ventricular  diastolic parameters are indeterminate. Right Ventricle: The right ventricular size is normal. Right vetricular wall thickness was not assessed. Right ventricular systolic function is normal. There is moderately elevated pulmonary artery systolic pressure. The tricuspid regurgitant velocity is  3.46 m/s, and with an assumed right atrial pressure of 5 mmHg, the estimated right ventricular systolic pressure is 84.6 mmHg. Left Atrium: Left atrial size was mild to moderately dilated. Right Atrium: Right atrial size was normal in size. Pericardium: Small to moderate pericardial effusion surrounds heart. Most  apparent around RA, measures 13 mm in maximal dimension. Mitral Valve: There is mild thickening of the mitral valve leaflet(s). Mild mitral annular calcification. Mild mitral valve regurgitation. Tricuspid Valve: The tricuspid valve is normal in structure. Tricuspid valve regurgitation is mild to moderate. Aortic Valve: Aortic valve regurgitation is mild. Aortic regurgitation PHT measures 434 msec. Pulmonic Valve: The pulmonic valve was normal in structure. Pulmonic valve regurgitation is not visualized. Aorta: Aortic dilatation noted. There is mild dilatation of the aortic root, measuring 39 mm. Venous: The inferior vena cava is dilated in size with less than 50% respiratory variability, suggesting right atrial pressure of 15 mmHg. IAS/Shunts: No atrial level shunt detected by color flow Doppler. Additional Comments: A device lead is visualized.  LEFT VENTRICLE PLAX 2D LVIDd:         6.60 cm LVIDs:         5.90 cm LV PW:         1.10 cm LV IVS:        0.80 cm LVOT diam:     2.20 cm LV SV:         61 LV SV Index:   33 LVOT Area:     3.80 cm  LV Volumes (MOD) LV vol d, MOD A2C: 147.0 ml LV vol s, MOD A2C: 126.0 ml LV SV MOD A2C:     21.0 ml RIGHT VENTRICLE            IVC RV S prime:     9.25 cm/s  IVC diam: 2.00 cm TAPSE (M-mode): 1.2 cm LEFT ATRIUM             Index        RIGHT ATRIUM           Index LA diam:        3.80 cm 2.09 cm/m   RA Area:     22.30 cm LA Vol (A2C):   89.4 ml 49.08 ml/m  RA Volume:   66.00 ml  36.23 ml/m LA Vol (A4C):   63.1 ml 34.64 ml/m LA Biplane Vol: 76.2 ml 41.83 ml/m  AORTIC VALVE LVOT Vmax:   84.60 cm/s LVOT Vmean:  51.400 cm/s LVOT VTI:    0.160 m AI PHT:      434 msec  AORTA Ao Root diam: 3.90 cm Ao Asc diam:  3.70 cm MR Peak grad:    46.2 mmHg    TRICUSPID VALVE MR Mean grad:    31.0 mmHg    TR Peak grad:   47.9 mmHg MR Vmax:         340.00 cm/s  TR Vmax:        346.00 cm/s MR Vmean:        266.0 cm/s MR PISA:         1.27 cm     SHUNTS MR PISA Eff ROA: 15 mm       Systemic  VTI:  0.16 m MR PISA Radius:  0.45  cm      Systemic Diam: 2.20 cm MV [name] velocity: 3.92 cm/s Dorris Carnes MD Electronically signed by Dorris Carnes MD Signature Date/Time: 10/07/2021/2:14:42 PM    Final       HISTORY:   Allergies: No Known Allergies  Current Medications: Current Outpatient Medications  Medication Sig Dispense Refill   amiodarone (PACERONE) 200 MG tablet Take 1 tablet (200 mg total) by mouth 2 (two) times daily. 60 tablet 0   apixaban (ELIQUIS) 5 MG TABS tablet Take 1 tablet (5 mg total) by mouth 2 (two) times daily. 60 tablet 0   atorvastatin (LIPITOR) 80 MG tablet Take 80 mg by mouth at bedtime.     budesonide-formoterol (SYMBICORT) 80-4.5 MCG/ACT inhaler Inhale 2 puffs into the lungs 2 (two) times daily.     clotrimazole-betamethasone (LOTRISONE) cream Apply 1 application topically 2 (two) times daily. (Patient not taking: Reported on 10/06/2021) 30 g 0   digoxin (LANOXIN) 0.125 MG tablet Take 1 tablet (0.125 mg total) by mouth daily. 30 tablet 0   empagliflozin (JARDIANCE) 10 MG TABS tablet Take 1 tablet (10 mg total) by mouth daily. 30 tablet 0   Ferrous Fumarate (HEMOCYTE - 106 MG FE) 324 (106 Fe) MG TABS tablet Take 1 tablet by mouth.     magnesium oxide (MAG-OX) 400 (241.3 Mg) MG tablet Take 400 mg by mouth 2 (two) times daily.     metFORMIN (GLUCOPHAGE) 1000 MG tablet Take 1,000 mg by mouth 2 (two) times daily.     metoprolol succinate (TOPROL-XL) 25 MG 24 hr tablet Take 1/2 tablet (12.5 mg total) by mouth daily. 15 tablet 0   sacubitril-valsartan (ENTRESTO) 24-26 MG Take 1 tablet by mouth 2 (two) times daily. 60 tablet 0   torsemide (DEMADEX) 20 MG tablet Take 1 tablet (20 mg total) by mouth daily. 30 tablet 0   No current facility-administered medications for this visit.    I, Rita Ohara, am acting as scribe for [name], MD  I have reviewed this report as typed by the medical scribe, and it is complete and accurate.

## 2021-10-15 NOTE — Progress Notes (Addendum)
HEART & VASCULAR TRANSITION OF CARE CONSULT NOTE     Referring Physician: Dr. Virgina Jock Primary Care: [name], PA-C Primary Cardiologist: Dr. Virgina Jock    HPI: Referred to clinic by Dr. Virgina Jock for heart failure consultation.   80 y/o male w/ chronic systolic heart failure/ ischemic CM, CAD w/ remote PCI to LAD, S/p ICD, newly diagnosed atrial fibrillation, Type 2 DM, HTN, tobacco use, COPD and melanoma-currently on monthly chemotherapy. Prior to recently, had received most of his cardiac care in [facility].   Recently admitted to [facility] for acute on chronic systolic heart failure and new atrial fibrillation. Also influenza A positive. [facility]iology was consulted. Echo showed severely reduced ejection fraction <20%. RV normal. R/LHC showed patent mLAD stent and 100% ostial RCA occlusion w/ Left-to-right collaterals up to the mRCA. No other significant disease. LV size and function out of proportion to RCA occlusion and patent mid LAD stent. RHC showed mildly elevated filling pressures and marginal output, CO 3.6 L/min CI: 2.0 L/min/m2. He was diuresed further and started on GDMT. He was also started on antiocagulation and ? blocker for rate control of his afib w/ plans to set up for outpatient DCCV if no spontaneous conversion. He was discharged home on 12/2. D/w wt 146 lb.   Per Chart review, he returned to the ED on 12/4 via EMS w/ AMS. Family felt he was "not acting like himself". He was hyperglycemic w/ blood glucose in 300 range. Beta-Hydroxybutyric Acid elevated at 0.51. BMP w/ mild AKI, SCr 1.44 (up from 1.2). K 4.6. CO2 32. UA c/w UTI. WBC 10.4. Vital signs were stable. Unfortunately, he got tired of waiting and left the ED prior to completion of treatment.   Presents to [facility] clinic for evaluation. Here w/ his daughter. Wt down 8 lb post hospital, from 146>>138 lb. Continues to feel poorly. Failure to thrive. Tired and weak. Sleeping most the day. No energy. Poor  appetite. Also w/ recent diarrhea. Remains on 3L [place]. Has not had to increase O2 and comfortable w/ current support. Denies CP. Remains in rate controlled Afib on EKG. BP soft but normotensive. Denies syncope/ near syncope. Denies abnormal bleeding w/ Eliquis. Denies fever, chills. No dysuria. Daughter is concerned w/ him going back home and feels that he may need to be readmitted.   Given recent w/u in the ED and current/persistent symptoms, I advised that he go back to ED. Patient and daughter agreeable.   Cardiac Testing   2D Echo 11/22  Left ventricular ejection fraction, by estimation, is <20%. The left ventricle has severely decreased function. The left ventricle demonstrates global hypokinesis. The left ventricular internal cavity size was moderately dilated. Left ventricular diastolic parameters are indeterminate. 1. Right ventricular systolic function is normal. The right ventricular size is normal. There is moderately elevated pulmonary artery systolic pressure. 2. 3. Left atrial size was mild to moderately dilated. Small to moderate pericardial effusion surrounds heart. Most apparent around RA, measures 13 mm in maximal dimension. 4. 5. Mild mitral valve regurgitation. 6. Tricuspid valve regurgitation is mild to moderate. 7. Aortic valve regurgitation is mild. 8. Aortic dilatation noted. There is mild dilatation of the aortic root, measuring 39 mm. The inferior vena cava is dilated in size with <50% respiratory variability, suggesting right atrial pressure of 15 mmHg.   R/LHC 11/22   LM: Normal LAD: [place] mid LAD stent. No significant disease Lcx: No significant disease RCA: Ostial 100% occlusion. Left-to-right collateral up to mid RCA  RA: 11 mmHg RV: 51/0 mmHg PA: 64/22 mmHg, mPAP 34 mmHg PCW: 23 mmHg   CO: 3.6 L/min CI: 2.0 L/min/m2   Impression: Mildly decompensated nonischemic cardiomyopathy LV size and function out of proportion to RCA occlusion and  patent mid LAD stent Recommend GDMT for heart failure    Review of Systems: [y] = yes, [ ]  = no   General: Weight gain [ ] ; Weight loss [ ] ; Anorexia [ Y]; Fatigue [Y ]; Fever [ ] ; Chills [ ] ; Weakness [ Y]  Cardiac: Chest pain/pressure [ ] ; Resting SOB [ ] ; Exertional SOB [ Y]; Orthopnea [ ] ; Pedal Edema [ ] ; Palpitations [ ] ; Syncope [ ] ; Presyncope [ ] ; Paroxysmal nocturnal dyspnea[ ]   Pulmonary: Cough [ ] ; Wheezing[ ] ; Hemoptysis[ ] ; Sputum [ ] ; Snoring [ ]   GI: Vomiting[ ] ; Dysphagia[ ] ; Melena[ ] ; Hematochezia [ ] ; Heartburn[ ] ; Abdominal pain [ ] ; Constipation [ ] ; Diarrhea [ Y]; BRBPR [ ]   GU: Hematuria[ ] ; Dysuria [ ] ; Nocturia[ ]   Vascular: Pain in legs with walking [ ] ; Pain in feet with lying flat [ ] ; Non-healing sores [ ] ; Stroke [ ] ; TIA [ ] ; Slurred speech [ ] ;  Neuro: Headaches[ ] ; Vertigo[ ] ; Seizures[ ] ; Paresthesias[ ] ;Blurred vision [ ] ; Diplopia [ ] ; Vision changes [ ]   Ortho/Skin: Arthritis [ ] ; Joint pain [ ] ; Muscle pain [ ] ; Joint swelling [ ] ; Back Pain [ ] ; Rash [ ]   Psych: Depression[ ] ; Anxiety[ ]   Heme: Bleeding problems [ ] ; Clotting disorders [ ] ; Anemia [ ]   Endocrine: Diabetes [ Y]; Thyroid dysfunction[ ]    Past Medical History:  Diagnosis Date   Cancer ([place])    CHF (congestive heart failure) (H[facility])    COPD (chronic obstructive pulmonary disease) (HCC)    Diabetes mellitus without complication ([place])    History of myocardial infarction    Hyperlipidemia    Hypertension    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 60 tablet 0   apixaban (ELIQUIS) 5 MG TABS tablet Take 1 tablet (5 mg total) by mouth 2 (two) times daily. 60 tablet 0   atorvastatin (LIPITOR) 80 MG  tablet Take 80 mg by mouth at bedtime.     budesonide-formoterol (SYMBICORT) 80-4.5 MCG/ACT inhaler Inhale 2 puffs into the lungs 2 (two) times daily.     clotrimazole-betamethasone (LOTRISONE) cream Apply 1 application topically 2 (two) times daily. 30 g 0   digoxin (LANOXIN) 0.125 MG tablet Take 1 tablet (0.125 mg total) by mouth daily. 30 tablet 0   empagliflozin (JARDIANCE) 10 MG TABS tablet Take 1 tablet (10 mg total) by mouth daily. 30 tablet 0   magnesium oxide (MAG-OX) 400 (241.3 Mg) MG tablet Take 400 mg by mouth 2 (two) times daily.     metFORMIN (GLUCOPHAGE) 1000 MG tablet Take 1,000 mg by mouth 2 (two) times daily.     metoprolol succinate (TOPROL-XL) 25 MG 24 hr tablet Take 1/2 tablet (12.5 mg total) by mouth daily. 15 tablet 0   sacubitril-valsartan (ENTRESTO) 24-26 MG Take 1 tablet by mouth 2 (two) times daily. 60 tablet 0   torsemide (DEMADEX) 20 MG tablet Take 1 tablet (20 mg  total) by mouth daily. 30 tablet 0   Ferrous Fumarate (HEMOCYTE - 106 MG FE) 324 (106 Fe) MG TABS tablet Take 1 tablet by mouth. (Patient not taking: Reported on 10/17/2021)     No current facility-administered medications for this encounter.    No Known Allergies    Social History   Socioeconomic History   Marital status: Married    Spouse name: Not on file   Number of children: 2   Years of education: Not on file   Highest education level: Not on file  Occupational History   Not on file  Tobacco Use   Smoking status: Every Day    Packs/day: 0.50    Years: 60.00    Pack years: 30.00    Types: Cigarettes   Smokeless tobacco: Never  Substance and Sexual Activity   Alcohol use: Not Currently   Drug use: Not Currently   Sexual activity: Not Currently  Other Topics Concern   Not on file  Social History Narrative   Not on file   Social Determinants of Health   Financial Resource Strain: Low Risk    Difficulty of Paying Living Expenses: Not hard at all  Food Insecurity: No Food  Insecurity   Worried About Charity fundraiser in the Last Year: Never true   [place] in the Last Year: Never true  Transportation Needs: No Transportation Needs   Lack of Transportation (Medical): No   Lack of Transportation (Non-Medical): No  Physical Activity: Inactive   Days of Exercise per Week: 0 days   Minutes of Exercise per Session: 0 min  Stress: No Stress Concern Present   Feeling of Stress : Not at all  Social Connections: Socially Isolated   Frequency of Communication with Friends and Family: More than three times a week   Frequency of Social Gatherings with Friends and Family: More than three times a week   Attends Religious Services: Never   Marine scientist or Organizations: No   Attends Archivist Meetings: Never   Marital Status: Widowed  Human resources officer Violence: Not At Risk   Fear of Current or Ex-Partner: No   Emotionally Abused: No   Physically Abused: No   Sexually Abused: No      Family History  Problem Relation Age of Onset   Diabetes Mellitus II Father    Breast cancer Mother    Ovarian cancer Sister    Cancer Brother    Cancer Maternal Uncle    Breast cancer Niece    Cancer Brother     Vitals:   10/17/21 0934  BP: 92/60  Pulse: 73  SpO2: 90%  Weight: 62.6 kg    PHYSICAL EXAM: General:  frail/ fatigued appearing in [place]. Pale. On supp O2. No increased WOB  HEENT: normal Neck: supple. no JVD. Carotids 2+ bilat; no bruits. No lymphadenopathy or thryomegaly appreciated. Cor: PMI nondisplaced. Irregularly irregular rhythm and rate. No rubs, gallops or murmurs. Lungs: clear Abdomen: soft, nontender, nondistended. No hepatosplenomegaly. No bruits or masses. Good bowel sounds. Extremities: no cyanosis, clubbing, rash, edema Neuro: alert & oriented x 3, cranial nerves grossly intact. moves all 4 extremities w/o difficulty. Affect pleasant.  ECG: Atrial Fibrillation w/ PVCs, 76 bpm    ASSESSMENT & PLAN:  1. 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 Prior EF ~25% (Care Everywhere) - Echo 11/22  LVEF <20%, RV normal. Degree of LV dysfunction out of proportion to severity of CAD. Cath 11/22 w/ patent mLAD stent and 100% ostial RCA occlusion w/ Left-to-right collaterals up to the mRCA. No other significant disease.  - Suspect new drop in EF 2/2 rapid Afib, now rate controlled - NYHA III, volume status ok on exam, may be a little dry  - Continue Entresto 26-26 mg bid (BP too soft for titration) - Stop Jardiance given recent ED evaluation w/ hyperglycemia and ? early DKA (elevated Beta-Hydroxybutyric Acid level) + UA concerning for UTI. Also concern for volume depletion  - Continue Toprol XL 12.5 mg daily  - Continue Digoxin 0.125 mg daily. Check Dig level  - Check BMP today  - He has an ICD - Poor candidate for advanced therapies [facility] will continue to follow and titrate meds.    2. Atrial Fibrillation  - persistent, rate controlled - continue amiodarone, ? blocker and Eliquis - followed by gen cards w/ plans for outpatient DCCV after 3 weeks of a/c  - amio monitoring per cards  - denies abnormal bleeding. Check CBC   3. CAD - remote mLAD PCI - recent cath w/ patent LAD stent + 100% ostial RCA occlusion w/ Left-to-right collaterals up to the mRC - stable w/o CP - medical therapy w/ ? blocker + statin. No ASA given Eliquis - to be followed by [facility]iology   4. Type 2DM  - recent hgb A1c 6.7 - given recent ED evaluation w/ hyperglycemia and elevated Beta-Hydroxybutyric Acid level + UA concerning for UTI, will discontinue Jardiance - follow up w/ PCP   5. Hypertension  - controlled on current regimen, a bit soft  - BMP today  - GDMT per above  6. Tobacco Abuse - recently quit smoking   7. COPD/ Chronic Hypoxic Respiratory Failure   - on 3L home O2    8. Melanoma - getting chemo  Stop Jardiance. Check BMP, CBC and digoxin level.   Given failure to thrive, weakness, fatigue, increased lethargy and recent uncompleted ED w/u concerning for UTI and ? Early DKA, I recommended pt return back to ED for further evaluation. May need readmission by internal medicine service.  Both patient and daughter agreeable to plan.   Given age + multiple medical conditions outlined above, he is not a candidate for advanced therapies. He is being followed by [facility] cardiology. They will continue to manage HF, CAD and atrial fibrillation. Refer back to Dr. Virgina Jock.   NYHA III GDMT  Diuretic- Torsemide 20 daily  BB- Toprol XL 12.5 mg daily  Ace/ARB/ARNI Entresto 24-16  MRA - no  SGLT2i- Stopping Jardiance (poorly controlled DM)     Referred to HFSW (PCP, Medications, Transportation, ETOH Abuse, Drug Abuse, Insurance, Financial ): Yes  Refer to Pharmacy:  No Refer to Home Health:  No Refer to [facility] No  Refer to General Cardiology: Yes   Follow up w/ Dr. Earnie Larsson. Has appt 12/16  Lyda Jester, PA-C 10/17/2021

## 2021-10-16 ENCOUNTER — Telehealth: Payer: Self-pay

## 2021-10-16 DIAGNOSIS — E1169 Type 2 diabetes mellitus with other specified complication: Secondary | ICD-10-CM | POA: Diagnosis not present

## 2021-10-16 DIAGNOSIS — Z9181 History of falling: Secondary | ICD-10-CM | POA: Diagnosis not present

## 2021-10-16 DIAGNOSIS — E46 Unspecified protein-calorie malnutrition: Secondary | ICD-10-CM | POA: Diagnosis not present

## 2021-10-16 DIAGNOSIS — E785 Hyperlipidemia, unspecified: Secondary | ICD-10-CM | POA: Diagnosis not present

## 2021-10-16 DIAGNOSIS — I5023 Acute on chronic systolic (congestive) heart failure: Secondary | ICD-10-CM | POA: Diagnosis not present

## 2021-10-16 DIAGNOSIS — I4891 Unspecified atrial fibrillation: Secondary | ICD-10-CM | POA: Diagnosis not present

## 2021-10-16 DIAGNOSIS — Z79899 Other long term (current) drug therapy: Secondary | ICD-10-CM | POA: Diagnosis not present

## 2021-10-16 DIAGNOSIS — J9621 Acute and chronic respiratory failure with hypoxia: Secondary | ICD-10-CM | POA: Diagnosis not present

## 2021-10-16 DIAGNOSIS — J449 Chronic obstructive pulmonary disease, unspecified: Secondary | ICD-10-CM | POA: Diagnosis not present

## 2021-10-17 ENCOUNTER — Other Ambulatory Visit: Payer: Self-pay

## 2021-10-17 ENCOUNTER — Emergency Department (HOSPITAL_COMMUNITY): Payer: Medicare PPO

## 2021-10-17 ENCOUNTER — Encounter (HOSPITAL_COMMUNITY): Payer: Self-pay

## 2021-10-17 ENCOUNTER — Encounter (HOSPITAL_COMMUNITY): Payer: Self-pay | Admitting: Student in an Organized Health Care Education/Training Program

## 2021-10-17 ENCOUNTER — Inpatient Hospital Stay (HOSPITAL_COMMUNITY)
Admission: EM | Admit: 2021-10-17 | Discharge: 2021-10-21 | DRG: 177 | Disposition: A | Discharge status: Home Health | Payer: Medicare PPO | Attending: Student in an Organized Health Care Education/Training Program | Admitting: Student in an Organized Health Care Education/Training Program

## 2021-10-17 ENCOUNTER — Ambulatory Visit (HOSPITAL_BASED_OUTPATIENT_CLINIC_OR_DEPARTMENT_OTHER)
Admission: RE | Admit: 2021-10-17 | Discharge: 2021-10-17 | Disposition: A | Discharge status: Home / Self Care | Payer: Medicare PPO | Source: Ambulatory Visit | Attending: Cardiology | Admitting: Cardiology

## 2021-10-17 ENCOUNTER — Inpatient Hospital Stay (HOSPITAL_COMMUNITY): Payer: Medicare PPO

## 2021-10-17 VITALS — BP 92/60 | HR 73 | Wt 138.0 lb

## 2021-10-17 DIAGNOSIS — E1110 Type 2 diabetes mellitus with ketoacidosis without coma: Secondary | ICD-10-CM | POA: Insufficient documentation

## 2021-10-17 DIAGNOSIS — D649 Anemia, unspecified: Secondary | ICD-10-CM | POA: Diagnosis present

## 2021-10-17 DIAGNOSIS — I5022 Chronic systolic (congestive) heart failure: Secondary | ICD-10-CM | POA: Diagnosis present

## 2021-10-17 DIAGNOSIS — Z85820 Personal history of malignant melanoma of skin: Secondary | ICD-10-CM | POA: Insufficient documentation

## 2021-10-17 DIAGNOSIS — Z87891 Personal history of nicotine dependence: Secondary | ICD-10-CM | POA: Insufficient documentation

## 2021-10-17 DIAGNOSIS — Z681 Body mass index (BMI) 19 or less, adult: Secondary | ICD-10-CM

## 2021-10-17 DIAGNOSIS — I2510 Atherosclerotic heart disease of native coronary artery without angina pectoris: Secondary | ICD-10-CM | POA: Insufficient documentation

## 2021-10-17 DIAGNOSIS — A419 Sepsis, unspecified organism: Secondary | ICD-10-CM

## 2021-10-17 DIAGNOSIS — Z833 Family history of diabetes mellitus: Secondary | ICD-10-CM

## 2021-10-17 DIAGNOSIS — J189 Pneumonia, unspecified organism: Secondary | ICD-10-CM | POA: Diagnosis present

## 2021-10-17 DIAGNOSIS — E785 Hyperlipidemia, unspecified: Secondary | ICD-10-CM | POA: Diagnosis present

## 2021-10-17 DIAGNOSIS — E43 Unspecified severe protein-calorie malnutrition: Secondary | ICD-10-CM | POA: Insufficient documentation

## 2021-10-17 DIAGNOSIS — Z515 Encounter for palliative care: Secondary | ICD-10-CM

## 2021-10-17 DIAGNOSIS — E871 Hypo-osmolality and hyponatremia: Secondary | ICD-10-CM | POA: Diagnosis present

## 2021-10-17 DIAGNOSIS — Z95810 Presence of automatic (implantable) cardiac defibrillator: Secondary | ICD-10-CM | POA: Insufficient documentation

## 2021-10-17 DIAGNOSIS — I110 Hypertensive heart disease with heart failure: Secondary | ICD-10-CM | POA: Insufficient documentation

## 2021-10-17 DIAGNOSIS — I5042 Chronic combined systolic (congestive) and diastolic (congestive) heart failure: Secondary | ICD-10-CM | POA: Diagnosis not present

## 2021-10-17 DIAGNOSIS — J441 Chronic obstructive pulmonary disease with (acute) exacerbation: Secondary | ICD-10-CM | POA: Diagnosis present

## 2021-10-17 DIAGNOSIS — R627 Adult failure to thrive: Secondary | ICD-10-CM | POA: Diagnosis present

## 2021-10-17 DIAGNOSIS — I4891 Unspecified atrial fibrillation: Secondary | ICD-10-CM | POA: Diagnosis not present

## 2021-10-17 DIAGNOSIS — F17210 Nicotine dependence, cigarettes, uncomplicated: Secondary | ICD-10-CM | POA: Diagnosis present

## 2021-10-17 DIAGNOSIS — I428 Other cardiomyopathies: Secondary | ICD-10-CM | POA: Insufficient documentation

## 2021-10-17 DIAGNOSIS — N179 Acute kidney failure, unspecified: Secondary | ICD-10-CM | POA: Diagnosis present

## 2021-10-17 DIAGNOSIS — I255 Ischemic cardiomyopathy: Secondary | ICD-10-CM | POA: Diagnosis present

## 2021-10-17 DIAGNOSIS — N189 Chronic kidney disease, unspecified: Secondary | ICD-10-CM | POA: Diagnosis present

## 2021-10-17 DIAGNOSIS — G9340 Encephalopathy, unspecified: Secondary | ICD-10-CM | POA: Diagnosis not present

## 2021-10-17 DIAGNOSIS — E440 Moderate protein-calorie malnutrition: Secondary | ICD-10-CM | POA: Diagnosis present

## 2021-10-17 DIAGNOSIS — J690 Pneumonitis due to inhalation of food and vomit: Secondary | ICD-10-CM | POA: Diagnosis not present

## 2021-10-17 DIAGNOSIS — J9611 Chronic respiratory failure with hypoxia: Secondary | ICD-10-CM | POA: Insufficient documentation

## 2021-10-17 DIAGNOSIS — E861 Hypovolemia: Secondary | ICD-10-CM | POA: Diagnosis present

## 2021-10-17 DIAGNOSIS — I10 Essential (primary) hypertension: Secondary | ICD-10-CM | POA: Diagnosis present

## 2021-10-17 DIAGNOSIS — E1122 Type 2 diabetes mellitus with diabetic chronic kidney disease: Secondary | ICD-10-CM | POA: Diagnosis present

## 2021-10-17 DIAGNOSIS — Z7951 Long term (current) use of inhaled steroids: Secondary | ICD-10-CM

## 2021-10-17 DIAGNOSIS — Z66 Do not resuscitate: Secondary | ICD-10-CM | POA: Diagnosis not present

## 2021-10-17 DIAGNOSIS — I130 Hypertensive heart and chronic kidney disease with heart failure and stage 1 through stage 4 chronic kidney disease, or unspecified chronic kidney disease: Secondary | ICD-10-CM | POA: Diagnosis present

## 2021-10-17 DIAGNOSIS — Z955 Presence of coronary angioplasty implant and graft: Secondary | ICD-10-CM

## 2021-10-17 DIAGNOSIS — Z79899 Other long term (current) drug therapy: Secondary | ICD-10-CM | POA: Insufficient documentation

## 2021-10-17 DIAGNOSIS — Z7984 Long term (current) use of oral hypoglycemic drugs: Secondary | ICD-10-CM

## 2021-10-17 DIAGNOSIS — C4339 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: Secondary | ICD-10-CM | POA: Diagnosis present

## 2021-10-17 DIAGNOSIS — Z20822 Contact with and (suspected) exposure to covid-19: Secondary | ICD-10-CM | POA: Diagnosis present

## 2021-10-17 DIAGNOSIS — E119 Type 2 diabetes mellitus without complications: Secondary | ICD-10-CM

## 2021-10-17 DIAGNOSIS — J9621 Acute and chronic respiratory failure with hypoxia: Secondary | ICD-10-CM | POA: Diagnosis not present

## 2021-10-17 DIAGNOSIS — R0602 Shortness of breath: Secondary | ICD-10-CM

## 2021-10-17 DIAGNOSIS — L821 Other seborrheic keratosis: Secondary | ICD-10-CM | POA: Diagnosis present

## 2021-10-17 DIAGNOSIS — Z7901 Long term (current) use of anticoagulants: Secondary | ICD-10-CM

## 2021-10-17 DIAGNOSIS — N281 Cyst of kidney, acquired: Secondary | ICD-10-CM | POA: Diagnosis not present

## 2021-10-17 DIAGNOSIS — R64 Cachexia: Secondary | ICD-10-CM | POA: Diagnosis present

## 2021-10-17 DIAGNOSIS — I252 Old myocardial infarction: Secondary | ICD-10-CM

## 2021-10-17 DIAGNOSIS — Z7189 Other specified counseling: Secondary | ICD-10-CM | POA: Diagnosis not present

## 2021-10-17 LAB — I-STAT ARTERIAL BLOOD GAS, ED
Acid-Base Excess: 1 mmol/L (ref 0.0–2.0)
Bicarbonate: 25.4 mmol/L (ref 20.0–28.0)
Calcium, Ion: 1.06 mmol/L — ABNORMAL LOW (ref 1.15–1.40)
HCT: 36 % — ABNORMAL LOW (ref 39.0–52.0)
Hemoglobin: 12.2 g/dL — ABNORMAL LOW (ref 13.0–17.0)
O2 Saturation: 94 %
Potassium: 4.4 mmol/L (ref 3.5–5.1)
Sodium: 135 mmol/L (ref 135–145)
TCO2: 27 mmol/L (ref 22–32)
pCO2 arterial: 40.7 mmHg (ref 32.0–48.0)
pH, Arterial: 7.403 (ref 7.350–7.450)
pO2, Arterial: 69 mmHg — ABNORMAL LOW (ref 83.0–108.0)

## 2021-10-17 LAB — COMPREHENSIVE METABOLIC PANEL
ALT: 18 U/L (ref 0–44)
AST: 13 U/L — ABNORMAL LOW (ref 15–41)
Albumin: 3 g/dL — ABNORMAL LOW (ref 3.5–5.0)
Alkaline Phosphatase: 81 U/L (ref 38–126)
Anion gap: 15 (ref 5–15)
BUN: 64 mg/dL — ABNORMAL HIGH (ref 8–23)
CO2: 29 mmol/L (ref 22–32)
Calcium: 8.6 mg/dL — ABNORMAL LOW (ref 8.9–10.3)
Chloride: 90 mmol/L — ABNORMAL LOW (ref 98–111)
Creatinine, Ser: 2.1 mg/dL — ABNORMAL HIGH (ref 0.61–1.24)
GFR, Estimated: 31 mL/min — ABNORMAL LOW (ref >60)
Glucose, Bld: 86 mg/dL (ref 70–99)
Potassium: 5.1 mmol/L (ref 3.5–5.1)
Sodium: 134 mmol/L — ABNORMAL LOW (ref 135–145)
Total Bilirubin: 1.2 mg/dL (ref 0.3–1.2)
Total Protein: 6.7 g/dL (ref 6.5–8.1)

## 2021-10-17 LAB — CBC WITH DIFFERENTIAL/PLATELET
Abs Immature Granulocytes: 0.04 10*3/uL (ref 0.00–0.07)
Basophils Absolute: 0.1 10*3/uL (ref 0.0–0.1)
Basophils Relative: 1 %
Eosinophils Absolute: 0 10*3/uL (ref 0.0–0.5)
Eosinophils Relative: 0 %
HCT: 42.9 % (ref 39.0–52.0)
Hemoglobin: 13.7 g/dL (ref 13.0–17.0)
Immature Granulocytes: 0 %
Lymphocytes Relative: 7 %
Lymphs Abs: 0.7 10*3/uL (ref 0.7–4.0)
MCH: 29.8 pg (ref 26.0–34.0)
MCHC: 31.9 g/dL (ref 30.0–36.0)
MCV: 93.5 fL (ref 80.0–100.0)
Monocytes Absolute: 0.7 10*3/uL (ref 0.1–1.0)
Monocytes Relative: 6 %
Neutro Abs: 9.3 10*3/uL — ABNORMAL HIGH (ref 1.7–7.7)
Neutrophils Relative %: 86 %
Platelets: 356 10*3/uL (ref 150–400)
RBC: 4.59 MIL/uL (ref 4.22–5.81)
RDW: 13.6 % (ref 11.5–15.5)
WBC: 10.8 10*3/uL — ABNORMAL HIGH (ref 4.0–10.5)
nRBC: 0 % (ref 0.0–0.2)

## 2021-10-17 LAB — RESP PANEL BY RT-PCR (FLU A&B, COVID) ARPGX2
Influenza A by PCR: NEGATIVE
Influenza B by PCR: NEGATIVE
SARS Coronavirus 2 by RT PCR: NEGATIVE

## 2021-10-17 LAB — CBG MONITORING, ED: Glucose-Capillary: 166 mg/dL — ABNORMAL HIGH (ref 70–99)

## 2021-10-17 LAB — BRAIN NATRIURETIC PEPTIDE: B Natriuretic Peptide: 419.4 pg/mL — ABNORMAL HIGH (ref 0.0–100.0)

## 2021-10-17 LAB — I-STAT CHEM 8, ED
BUN: 58 mg/dL — ABNORMAL HIGH (ref 8–23)
Calcium, Ion: 0.96 mmol/L — ABNORMAL LOW (ref 1.15–1.40)
Chloride: 92 mmol/L — ABNORMAL LOW (ref 98–111)
Creatinine, Ser: 2.2 mg/dL — ABNORMAL HIGH (ref 0.61–1.24)
Glucose, Bld: 83 mg/dL (ref 70–99)
HCT: 44 % (ref 39.0–52.0)
Hemoglobin: 15 g/dL (ref 13.0–17.0)
Potassium: 4.7 mmol/L (ref 3.5–5.1)
Sodium: 132 mmol/L — ABNORMAL LOW (ref 135–145)
TCO2: 33 mmol/L — ABNORMAL HIGH (ref 22–32)

## 2021-10-17 LAB — DIGOXIN LEVEL: Digoxin Level: 2 ng/mL (ref 0.8–2.0)

## 2021-10-17 LAB — PROTIME-INR
INR: 1.6 — ABNORMAL HIGH (ref 0.8–1.2)
Prothrombin Time: 18.9 seconds — ABNORMAL HIGH (ref 11.4–15.2)

## 2021-10-17 LAB — APTT: aPTT: 33 seconds (ref 24–36)

## 2021-10-17 LAB — LACTIC ACID, PLASMA: Lactic Acid, Venous: 1.6 mmol/L (ref 0.5–1.9)

## 2021-10-17 MED ORDER — METOPROLOL SUCCINATE ER 25 MG PO TB24: 12.5000 mg | ORAL_TABLET | Freq: Every day | ORAL | Status: DC

## 2021-10-17 MED ORDER — ALBUTEROL SULFATE (2.5 MG/3ML) 0.083% IN NEBU: 2.5000 mg | INHALATION_SOLUTION | RESPIRATORY_TRACT | Status: DC | PRN

## 2021-10-17 MED ORDER — SODIUM CHLORIDE 0.9 % IV SOLN
1.0000 g | Freq: Once | INTRAVENOUS | Status: AC
  Administered 2021-10-17: 1 g via INTRAVENOUS
  Filled 2021-10-17: qty 10

## 2021-10-17 MED ORDER — METOPROLOL SUCCINATE ER 25 MG PO TB24
12.5000 mg | ORAL_TABLET | Freq: Every day | ORAL | Status: DC
  Administered 2021-10-18 – 2021-10-21 (×4): 12.5 mg via ORAL
  Filled 2021-10-17 (×4): qty 1

## 2021-10-17 MED ORDER — DIGOXIN 125 MCG PO TABS: 0.1250 mg | ORAL_TABLET | Freq: Every day | ORAL | Status: DC

## 2021-10-17 MED ORDER — SODIUM CHLORIDE 0.9 % IV SOLN
3.0000 g | Freq: Two times a day (BID) | INTRAVENOUS | Status: DC
  Administered 2021-10-17 – 2021-10-19 (×4): 3 g via INTRAVENOUS
  Filled 2021-10-17 (×5): qty 8

## 2021-10-17 MED ORDER — SODIUM CHLORIDE 0.9 % IV SOLN: 2.0000 g | INTRAVENOUS | Status: DC

## 2021-10-17 MED ORDER — ALBUTEROL SULFATE (2.5 MG/3ML) 0.083% IN NEBU
10.0000 mg/h | INHALATION_SOLUTION | RESPIRATORY_TRACT | Status: AC
  Administered 2021-10-17: 10 mg/h via RESPIRATORY_TRACT
  Filled 2021-10-17: qty 12

## 2021-10-17 MED ORDER — SODIUM CHLORIDE 0.9 % IV BOLUS
1000.0000 mL | Freq: Once | INTRAVENOUS | Status: AC
  Administered 2021-10-17: 1000 mL via INTRAVENOUS

## 2021-10-17 MED ORDER — ATORVASTATIN CALCIUM 80 MG PO TABS
80.0000 mg | ORAL_TABLET | Freq: Every day | ORAL | Status: DC
  Administered 2021-10-17 – 2021-10-20 (×4): 80 mg via ORAL
  Filled 2021-10-17 (×4): qty 1

## 2021-10-17 MED ORDER — UMECLIDINIUM BROMIDE 62.5 MCG/ACT IN AEPB
1.0000 | INHALATION_SPRAY | Freq: Every day | RESPIRATORY_TRACT | Status: DC
  Administered 2021-10-18 – 2021-10-21 (×4): 1 via RESPIRATORY_TRACT
  Filled 2021-10-17 (×2): qty 7

## 2021-10-17 MED ORDER — ALBUTEROL SULFATE (2.5 MG/3ML) 0.083% IN NEBU: 10.0000 mg/h | INHALATION_SOLUTION | RESPIRATORY_TRACT | Status: DC

## 2021-10-17 MED ORDER — FLUTICASONE FUROATE-VILANTEROL 100-25 MCG/ACT IN AEPB
1.0000 | INHALATION_SPRAY | Freq: Every day | RESPIRATORY_TRACT | Status: DC
  Administered 2021-10-17 – 2021-10-21 (×5): 1 via RESPIRATORY_TRACT
  Filled 2021-10-17 (×2): qty 28

## 2021-10-17 MED ORDER — METHYLPREDNISOLONE SODIUM SUCC 125 MG IJ SOLR
125.0000 mg | Freq: Once | INTRAMUSCULAR | Status: AC
  Administered 2021-10-17: 125 mg via INTRAVENOUS
  Filled 2021-10-17: qty 2

## 2021-10-17 MED ORDER — SODIUM CHLORIDE 0.9 % IV SOLN
500.0000 mg | INTRAVENOUS | Status: DC
  Administered 2021-10-17: 500 mg via INTRAVENOUS
  Filled 2021-10-17: qty 5

## 2021-10-17 MED ORDER — AMIODARONE HCL 200 MG PO TABS
200.0000 mg | ORAL_TABLET | Freq: Two times a day (BID) | ORAL | Status: DC
  Administered 2021-10-17 – 2021-10-21 (×9): 200 mg via ORAL
  Filled 2021-10-17 (×9): qty 1

## 2021-10-17 MED ORDER — APIXABAN 5 MG PO TABS
5.0000 mg | ORAL_TABLET | Freq: Two times a day (BID) | ORAL | Status: DC
  Administered 2021-10-17 – 2021-10-21 (×9): 5 mg via ORAL
  Filled 2021-10-17 (×9): qty 1

## 2021-10-17 NOTE — ED Triage Notes (Addendum)
Patient sent to [facility] from heart and vascular for evaluation for shortness of breath, abdominal pain, loss of appetite, and diarrhea that started one week ago. Patient's daughter with whom the patient lives states he was recently started on several new medications and she is concerned that some of them may be contributing to his symptoms.

## 2021-10-17 NOTE — ED Notes (Signed)
Report given to [place], RN (5 [place])

## 2021-10-17 NOTE — H&P (Signed)
NAME:  Kevin [name], MRN:  [id number], DOB:  1941-04-16, LOS: 0 ADMISSION DATE:  10/17/2021, Primary: Kevin Bender, PA-C  CHIEF COMPLAINT:  weakness, altered mental status   Medical Service: Internal Medicine Teaching Service         Attending Physician: Dr. Evette Doffing, Mallie Mussel, *    First Contact: Dr. Ileene Musa Pager: [phone]  Second Contact: Dr. Lisabeth Devoid Pager: [phone]       After Hours (After 5p/  First Contact Pager: [phone]  weekends / holidays): Second Contact Pager: [phone]    History of present illness   80 year old male with the past medical history as noted below who was recently admitted to our service 11/27-12/2 for multiple issues including new decrease in EF, atrial fibrillation, COPD exacerbation, and influenza.  He presents to the emergency department today at the discretion of the cardiology office after having been there for a posthospital visit. History primarily obtained from his daughter.  Patient had minimal contribution to the history taking due to encephalopathy.  His daughter notes that he has not done well functionally since his last admission.  She notes that he has had decreased appetite, fatigue, and general malaise.  She notes that he had a postviral cough after discharge however it had improved during the admission and the mucus that he was producing mornings cleared.  Over the past few days, he developed worsening cough with production of thick greenish-yellow sputum.  She denies any known fever or chills.  She also feels that his mental status has gotten worse over the past few days.  His supplemental oxygen requirement has not increased during this time he remains on 3 L that he was discharged with on 12/2.  She endorses his compliance with the new medications from discharge including metoprolol, digoxin, amiodarone, Entresto, Jardiance, Eliquis, and torsemide.  He has been residing with his daughter since discharge.  She notes that, prior to last  admission, he had a significantly better functional status and was able to perform all of his own ADLs and the vast majority of his IADLs.  Since discharge, he is required a lot more help.  Of note, he was seen in the emergency department on 12/4 however documentation indicates that he and his daughter left on their own accord after becoming upset.  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   Home Medications     Prior to Admission medications   Medication Sig Start Date End Date Taking? Authorizing Provider  albuterol (PROVENTIL) (2.5 MG/3ML) 0.083% nebulizer solution Take 2.5 mg by nebulization every 4 (four) hours as needed for shortness of breath or wheezing. 10/16/21  Yes [provider]  amiodarone (PACERONE) 200 MG tablet Take 1 tablet (200 mg total) by mouth 2 (two) times daily. 10/11/21 11/10/21 Yes Demaio, Alexa, MD  apixaban (ELIQUIS) 5 MG TABS tablet Take 1 tablet (5 mg total) by mouth 2 (two) times daily. 10/11/21 11/10/21 Yes Demaio, Alexa, MD  atorvastatin (LIPITOR) 80 MG tablet Take 80 mg by mouth at bedtime.   Yes [provider]  budesonide-formoterol (SYMBICORT) 80-4.5 MCG/ACT inhaler Inhale 2 puffs into the lungs 2 (two) times daily.   Yes [provider]  clotrimazole-betamethasone (LOTRISONE) cream Apply 1 application topically 2 (two) times daily. 10/17/20  Yes [name], MD  digoxin (LANOXIN) 0.125 MG tablet Take 1 tablet (0.125 mg total) by mouth daily. 10/12/21 11/11/21 Yes Demaio, Alexa, MD  magnesium oxide (MAG-OX) 400 (241.3 Mg) MG tablet Take 400 mg by  mouth 2 (two) times daily.   Yes [provider]  metFORMIN (GLUCOPHAGE) 1000 MG tablet Take 1,000 mg by mouth 2 (two) times daily. 12/26/20  Yes [provider]  metoprolol succinate (TOPROL-XL) 25 MG 24 hr tablet Take 1/2 tablet (12.5 mg total) by mouth daily. 10/12/21 11/11/21 Yes Demaio, Alexa, MD  sacubitril-valsartan (ENTRESTO) 24-26 MG Take 1 tablet by mouth 2 (two) times daily. 10/11/21 11/10/21 Yes Demaio, Alexa, MD  torsemide (DEMADEX) 20 MG tablet Take 1 tablet (20 mg total) by mouth daily. 10/12/21 11/11/21 Yes Demaio, Alexa, MD  Ferrous Fumarate (HEMOCYTE - 106 MG FE) 324 (106 Fe) MG TABS tablet Take 1 tablet by mouth. Patient not taking: Reported on 10/17/2021    [provider]    Allergies    Allergies as of 10/17/2021   (No Known Allergies)    Social History   reports that he has been smoking cigarettes. He has a 30.00 pack-year smoking history. He has never used smokeless tobacco. He reports that he does not currently use alcohol. He reports that he does not currently use drugs.   Family History   His family history includes Breast cancer in his mother and niece; Cancer in his brother, brother, and maternal uncle; Diabetes Mellitus II in his father; Ovarian cancer in his sister.   ROS  Unable to be obtained due to acute encephalopathy  Objective   Blood pressure (!) 99/56, pulse 69, temperature (!) 97.4 F (36.3 C), temperature source Oral, resp. rate 17, SpO2 95 %.    General: chronically ill, frail appearing gentleman lying in bed HEENT: head atraumatic, dry mucous membranes, no scleral icterus or conjunctival injection Cardiac: Irregular rhythm, regular rate.  No lower extremity or gravity dependent edema.  Extremities are well-perfused and warm Pulm: Breathing comfortably on 3 L supplemental oxygen.  Lung sounds are diminished throughout, mild wheezing.  Crackles appreciated at the right base. GI: Abdomen is soft, nontender, nondistended Neuro:  Lethargic but awakens to voice and answers questions appropriately, quickly falls back asleep.  No focal deficits appreciated on limited to exam Skin: Warm and dry.  Numerous and scattered seborrheic keratoses  Labs    CBC Latest Ref Rng & Units 10/17/2021 10/17/2021 10/13/2021  WBC 4.0 - 10.5 K/uL - 10.8(H) 10.4  Hemoglobin 13.0 - 17.0 g/dL 15.0 13.7 13.9  Hematocrit 39.0 - 52.0 % 44.0 42.9 42.6  Platelets 150 - 400 K/uL - 356 401(H)   BMP Latest Ref Rng & Units 10/17/2021 10/17/2021 10/13/2021  Glucose 70 - 99 mg/dL 83 86 252(H)  BUN 8 - 23 mg/dL 58(H) 64(H) 44(H)  Creatinine 0.61 - 1.24 mg/dL 2.20(H) 2.10(H) 1.44(H)  Sodium 135 - 145 mmol/L 132(L) 134(L) 137  Potassium 3.5 - 5.1 mmol/L 4.7 5.1 4.6  Chloride 98 - 111 mmol/L 92(L) 90(L) 94(L)  CO2 22 - 32 mmol/L - 29 32  Calcium 8.9 - 10.3 mg/dL - 8.6(L) 8.9   CXR--right lower lobe infiltrate Summary  81 year old chronically ill male with CHF (EF less than 20%) and COPD who was recently admitted for CHF exacerbation and influenza.  He is being readmitted to our service for postviral bacterial pneumonia with associated AKI.  Assessment &  Plan:  Principal Problem:   CAP (community acquired pneumonia)  Post viral bacterial lobar pneumonia complicated by AKI and encephalopathy He does not fit sepsis criteria.  Suspect hypotension on arrival was hypovolemic due to poor oral intake. AKI is likely multifactorial including acute infection, poor oral intake and several medication additions last admission. Lactate wnl.  COPD with subacute respiratory failure Remains on 3 L supplemental oxygen since his last discharge.  Received solumedrol in the ED however I suspect that his acute symptoms are primarily attributable to CAP so will hold off on additional systemic steroids for now Time frame does not fit for amio pulm toxicity however it does appear that he was on amiodarone several years ago as well so is something to keep on the differential if  respiratory failure continues after treatment of CAP Plan -Received 2 L fluid bolus with NS in the ED.  Will hold off on further fluids in the setting of significant heart failure. -5-day course of azithromycin and Rocephin -ABG to rule out hypercapnia as a contributing factor to his encephalopathy -Continue Breo, add Incruse, as needed albuterol nebulizer -Maintain oxygen saturation 88-92% -Pulmonary hygeine -Hold nephrotoxic agents/IV contrast, maintain MAP greater than 65  Mild hyponatremia Likely related to acute pulmonary process vs malnutrition -Continue to monitor  Chronic systolic heart failure secondary to ischemic cardiomyopathy Euvolemic, BNP down to 419 from 1250 last admission.  Atrial fibrillation CHADS2VASC 4. Rate controlled -Hold torsemide, entresto, metoprolol, and digoxin for low-normal blood pressures and AKI.Resume as able -Continue lipitor -Continue amiodarone -Continue eliquis. May need to consider lower dosing if renal function does not improve -Monitor volume status closely -May benefit from palliative consultation either inpatient or after discharge depending on trajectory of admission  Type 2 Diabetes Mellitus -Hold metformin in the setting of AKI -Jardiance discontinued by cardiology out of concern for early DKA during his last ED visit -Venous glucoses are in the 80s so will hold off on starting insulin for now. CBGs q8h.  -Start insulin for CBGs >180  Physical deconditioning. Suspect this is driven by acute infection and multiple comorbidities. 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   Mitzi Hansen, MD Internal Medicine Resident  PGY-3 [facility] Pager: [phone] 10/17/2021 2:12 PM

## 2021-10-17 NOTE — Telephone Encounter (Signed)
Attempted to call pt 2nd time for [facility], but he is currently admitted to the hospital

## 2021-10-17 NOTE — ED Provider Notes (Signed)
[facility] EMERGENCY DEPARTMENT Provider Note   CSN: [id number] Arrival date & time: 10/17/21  1020     History Chief Complaint  Patient presents with   Shortness of Breath    Kevin [name] is a 80 y.o. male.  Pt presents to the ED today with sob and hypotension.  Pt was admitted by the IMTS from 11/27-12/2 for acute resp failure secondary to flu A. He was also diagnosed with new onset afib and worsening chf.  He was d/c with home O2 at 2L, but his daughter has had to increase it.  He was also d/c with multiple medication changes.  He came back to the ED on 12/4 for AMS and sob.  It looks like he waited several hours and did not get seen, so he left.  Pt had an appt with the [facility] vascular center.  They sent him here for further eval.  Pt's daughter said he has not wanted to get out of bed.  He is very sob and coughs a lot.  He is not coughing up green sputum.  He has not wanted to eat.  BP in triage was 81/62.  Sepsis orders started.  Daughter did not give him him am meds as she was waiting to see if the heart doctor was going to change anything.        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) [facility])     Patient Active Problem List   Diagnosis Date Noted   Atrial fibrillation with RVR ([place])    HFrEF (heart failure with reduced ejection fraction) ([place])    Influenza A 10/06/2021   Macrocytic anemia 07/16/2021   Hypomagnesemia 10/17/2020    Class: Chronic   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 ([place]) 07/29/2020   B12 deficiency anemia 08/19/2018    Class:  Chronic   Hypoglycemia 04/21/2018   Chronic combined systolic and diastolic CHF (congestive heart failure) ([place]) 04/21/2018   Essential hypertension 04/21/2018   Diabetes mellitus type 2 in nonobese ([place]) 04/21/2018   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) ([place]) 03/20/2015    Class: Chronic    Past Surgical History:  Procedure Laterality Date   APPENDECTOMY     CARDIAC CATHETERIZATION     CORONARY ANGIOPLASTY     PACEMAKER INSERTION     RIGHT/LEFT HEART CATH AND CORONARY ANGIOGRAPHY N/A 10/09/2021   Procedure: RIGHT/LEFT HEART CATH AND CORONARY ANGIOGRAPHY;  Surgeon: Nigel Mormon, MD;  Location: [facility];  Service: Cardiovascular;  Laterality: N/A;       Family History  Problem Relation Age of Onset   Diabetes Mellitus II Father    Breast cancer Mother    Ovarian cancer Sister    Cancer Brother    Cancer Maternal Uncle    Breast cancer Niece    Cancer Brother     Social History   Tobacco Use   Smoking status: Every Day    Packs/day: 0.50    Years: 60.00    Pack years: 30.00  Types: Cigarettes   Smokeless tobacco: Never  Substance Use Topics   Alcohol use: Not Currently   Drug use: Not Currently    Home Medications Prior to Admission medications   Medication Sig Start Date End Date Taking? Authorizing Provider  albuterol (PROVENTIL) (2.5 MG/3ML) 0.083% nebulizer solution Take by nebulization. 10/16/21   [provider]  amiodarone (PACERONE) 200 MG tablet Take 1 tablet (200 mg total) by mouth 2 (two) times daily. 10/11/21 11/10/21  Demaio, Roxine Caddy, MD  apixaban (ELIQUIS) 5 MG TABS tablet Take 1 tablet (5 mg total) by mouth 2 (two) times daily. 10/11/21 11/10/21  Demaio, Roxine Caddy, MD  atorvastatin (LIPITOR) 80 MG tablet Take 80 mg by mouth at bedtime.    [provider]  budesonide-formoterol (SYMBICORT) 80-4.5 MCG/ACT inhaler Inhale 2 puffs into the lungs 2 (two) times daily.    [provider]  clotrimazole-betamethasone  (LOTRISONE) cream Apply 1 application topically 2 (two) times daily. 10/17/20   [name], MD  digoxin (LANOXIN) 0.125 MG tablet Take 1 tablet (0.125 mg total) by mouth daily. 10/12/21 11/11/21  Demaio, Roxine Caddy, MD  Ferrous Fumarate (HEMOCYTE - 106 MG FE) 324 (106 Fe) MG TABS tablet Take 1 tablet by mouth. Patient not taking: Reported on 10/17/2021    [provider]  magnesium oxide (MAG-OX) 400 (241.3 Mg) MG tablet Take 400 mg by mouth 2 (two) times daily.    [provider]  metFORMIN (GLUCOPHAGE) 1000 MG tablet Take 1,000 mg by mouth 2 (two) times daily. 12/26/20   [provider]  metoprolol succinate (TOPROL-XL) 25 MG 24 hr tablet Take 1/2 tablet (12.5 mg total) by mouth daily. 10/12/21 11/11/21  Demaio, Alexa, MD  sacubitril-valsartan (ENTRESTO) 24-26 MG Take 1 tablet by mouth 2 (two) times daily. 10/11/21 11/10/21  Demaio, Roxine Caddy, MD  torsemide (DEMADEX) 20 MG tablet Take 1 tablet (20 mg total) by mouth daily. 10/12/21 11/11/21  Scarlett Presto, MD    Allergies    Patient has no known allergies.  Review of Systems   Review of Systems  Constitutional:  Positive for activity change, appetite change and fatigue.  Respiratory:  Positive for cough.   Neurological:  Positive for weakness.  All other systems reviewed and are negative.  Physical Exam Updated Vital Signs BP (!) 99/56   Pulse 69   Temp (!) 97.4 F (36.3 C) (Oral)   Resp 17   SpO2 95%   Physical Exam Vitals and nursing note reviewed.  Constitutional:      General: He is in acute distress.     Appearance: He is well-developed. He is ill-appearing.  HENT:     Head: Normocephalic and atraumatic.     Mouth/Throat:     Mouth: Mucous membranes are dry.  Eyes:     Extraocular Movements: Extraocular movements intact.     Pupils: Pupils are equal, round, and reactive to light.  Cardiovascular:     Rate and Rhythm: Normal rate and regular rhythm.  Pulmonary:     Breath sounds: Wheezing present.   Abdominal:     General: Bowel sounds are normal.     Palpations: Abdomen is soft.  Musculoskeletal:        General: Normal range of motion.     Cervical back: Normal range of motion and neck supple.  Skin:    General: Skin is warm.     Capillary Refill: Capillary refill takes less than 2 seconds.  Neurological:     General: No focal deficit present.  Mental Status: He is alert and oriented to person, place, and time.  Psychiatric:        Mood and Affect: Mood normal.        Behavior: Behavior normal.    ED Results / Procedures / Treatments   Labs (all labs ordered are listed, but only abnormal results are displayed) Labs Reviewed  COMPREHENSIVE METABOLIC PANEL - Abnormal; Notable for the following components:      Result Value   Sodium 134 (*)    Chloride 90 (*)    BUN 64 (*)    Creatinine, Ser 2.10 (*)    Calcium 8.6 (*)    Albumin 3.0 (*)    AST 13 (*)    GFR, Estimated 31 (*)    All other components within normal limits  CBC WITH DIFFERENTIAL/PLATELET - Abnormal; Notable for the following components:   WBC 10.8 (*)    Neutro Abs 9.3 (*)    All other components within normal limits  BRAIN NATRIURETIC PEPTIDE - Abnormal; Notable for the following components:   B Natriuretic Peptide 419.4 (*)    All other components within normal limits  PROTIME-INR - Abnormal; Notable for the following components:   Prothrombin Time 18.9 (*)    INR 1.6 (*)    All other components within normal limits  I-STAT CHEM 8, ED - Abnormal; Notable for the following components:   Sodium 132 (*)    Chloride 92 (*)    BUN 58 (*)    Creatinine, Ser 2.20 (*)    Calcium, Ion 0.96 (*)    TCO2 33 (*)    All other components within normal limits  RESP PANEL BY RT-PCR (FLU A&B, COVID) ARPGX2  CULTURE, BLOOD (ROUTINE X 2)  CULTURE, BLOOD (ROUTINE X 2)  LACTIC ACID, PLASMA  DIGOXIN LEVEL  APTT  LACTIC ACID, PLASMA  URINALYSIS, ROUTINE W REFLEX MICROSCOPIC    EKG EKG  Interpretation  Date/Time:  Thursday October 17 2021 11:07:39 EST Ventricular Rate:  70 PR Interval:    QRS Duration: 179 QT Interval:  509 QTC Calculation: 550 R Axis:   -72 Text Interpretation: Afib/flutter and ventricular-paced rhythm No further analysis attempted due to paced rhythm No significant change since last tracing Confirmed by Isla Pence [phone]) on 10/17/2021 12:08:05 PM  Radiology DG Chest 1 View  Result Date: 10/17/2021 CLINICAL DATA:  Shortness of breath. EXAM: CHEST  1 VIEW COMPARISON:  10/13/2021 FINDINGS: A right jugular Port-A-Cath and left subclavian approach ICD are unchanged. The cardiac silhouette remains mildly enlarged, accentuated by AP technique and lower lung volumes on the current study. New patchy airspace opacity is present laterally in the right lung base. No sizable pleural effusion or pneumothorax is identified. Postsurgical changes are again noted at the right shoulder with chronically fractured cerclage wire. IMPRESSION: New patchy right basilar airspace opacity concerning for pneumonia. Electronically Signed   By: Logan Bores M.D.   On: 10/17/2021 11:04    Procedures Procedures   Medications Ordered in ED Medications  albuterol (PROVENTIL) (2.5 MG/3ML) 0.083% nebulizer solution (10 mg/hr Nebulization New Bag/Given 10/17/21 1205)  azithromycin (ZITHROMAX) 500 mg in sodium chloride 0.9 % 250 mL IVPB (500 mg Intravenous New Bag/Given 10/17/21 1221)  methylPREDNISolone sodium succinate (SOLU-MEDROL) 125 mg/2 mL injection 125 mg (has no administration in time range)  sodium chloride 0.9 % bolus 1,000 mL (has no administration in time range)  sodium chloride 0.9 % bolus 1,000 mL (1,000 mLs Intravenous Bolus 10/17/21 1136)  cefTRIAXone (ROCEPHIN)  1 g in sodium chloride 0.9 % 100 mL IVPB (0 g Intravenous Stopped 10/17/21 1217)    ED Course  I have reviewed the triage vital signs and the nursing notes.  Pertinent labs & imaging results that were  available during my care of the patient were reviewed by me and considered in my medical decision making (see chart for details).    MDM Rules/Calculators/A&P                           Pt's bp is better after fluids.  Pt does have a right sided basilar pna.  Pt given rocephin/zithromax.  Hypotension could be from sepsis.  It could also be from the numerous meds he's been put on recently.  It could also be from inadequate po intake as he does have an AKI.  Pt given a continuous neb and solumedrol for copd.  Covid/Flu neg although flu was + on 11/27.  Pt d/w IMTS for admission.  CRITICAL CARE Performed by: Isla Pence   Total critical care time: 30 minutes  Critical care time was exclusive of separately billable procedures and treating other patients.  Critical care was necessary to treat or prevent imminent or life-threatening deterioration.  Critical care was time spent personally by me on the following activities: development of treatment plan with patient and/or surrogate as well as nursing, discussions with consultants, evaluation of patient's response to treatment, examination of patient, obtaining history from patient or surrogate, ordering and performing treatments and interventions, ordering and review of laboratory studies, ordering and review of radiographic studies, pulse oximetry and re-evaluation of patient's condition.   Kevin [name] was evaluated in Emergency Department on 10/17/2021 for the symptoms described in the history of present illness. He was evaluated in the context of the global COVID-19 pandemic, which necessitated consideration that the patient might be at risk for infection with the SARS-CoV-2 virus that causes COVID-19. Institutional protocols and algorithms that pertain to the evaluation of patients at risk for COVID-19 are in a state of rapid change based on information released by regulatory bodies including the CDC and federal and state  organizations. These policies and algorithms were followed during the patient's care in the ED.    Final Clinical Impression(s) / ED Diagnoses Final diagnoses:  SOB (shortness of breath)  Sepsis without acute organ dysfunction, due to unspecified organism [facility])  Community acquired pneumonia of right lower lobe of lung  AKI (acute kidney injury) ([place])  COPD exacerbation ([place])    Rx / DC Orders ED Discharge Orders     None        Isla Pence, MD 10/17/21 1314

## 2021-10-17 NOTE — ED Notes (Signed)
Md at bedside

## 2021-10-17 NOTE — ED Provider Notes (Signed)
Emergency Medicine Provider Triage Evaluation Note  Kevin [name] , a 80 y.o. male  was evaluated in triage.  Pt complains of sob, cough, decreased PP intake, HA.  Diagnosed with the flu and hospitalized, discharged on 12-2.  Since then, symptoms have been worsening.  He wears oxygen at baseline, has needed to increase amount of oxygen.  Review of Systems  Positive: HA, sob, cough, weakness Negative: fever  Physical Exam  BP (!) 81/62 (BP Location: Right Arm)   Pulse 68   Temp (!) 97.4 F (36.3 C) (Oral)   Resp (!) 22   SpO2 92%  Gen:   ill Resp:  On O2.  Coarse lung sounds in the bases MSK:   Moves extremities without difficulty  Other:  Diffuse ttp of the abd  Medical Decision Making  Medically screening exam initiated at 10:29 AM.  Appropriate orders placed.  Kevin [name] was informed that the remainder of the evaluation will be completed by another provider, this initial triage assessment does not replace that evaluation, and the importance of remaining in the ED until their evaluation is complete.  Sepsis labs started due to pts bp   Franchot Heidelberg, PA-C 10/17/21 1031    Lucrezia Starch, MD 10/18/21 1506

## 2021-10-17 NOTE — Patient Instructions (Signed)
Stop Jardiance  Labs done today, your results will be available in MyChart, we will contact you for abnormal readings.  PLEASE REPORT TO THE ER  Thank you for allowing [place] to provider your heart failure care after your recent hospitalization. Please follow-up with [facility] as scheduled

## 2021-10-17 NOTE — Addendum Note (Signed)
Encounter addended by: Consuelo Pandy, PA-C on: 10/17/2021 11:43 AM  Actions taken: Clinical Note Signed

## 2021-10-17 NOTE — Progress Notes (Signed)
Pharmacy Antibiotic Note  Kevin [name] is a 80 y.o. male admitted on 10/17/2021 with  aspiration pneumonia .  Pharmacy has been consulted for Unasyn dosing.  WBC mildly elevated. SCr elevated.   Plan: -Unasyn 3 gm IV Q 12 hours -Monitor CBC, renal fx, cultures and clinical progress     Temp (24hrs), Avg:97.4 F (36.3 C), Min:97.4 F (36.3 C), Max:97.4 F (36.3 C)  Recent Labs  Lab 10/11/21 0104 10/11/21 0953 10/13/21 2120 10/17/21 1030 10/17/21 1132  WBC  --   --  10.4 10.8*  --   CREATININE 1.15 1.21 1.44* 2.10* 2.20*  LATICACIDVEN  --   --   --  1.6  --     Estimated Creatinine Clearance: 24.1 mL/min (A) (by C-G formula based on SCr of 2.2 mg/dL (H)).    No Known Allergies  Antimicrobials this admission: Unasyn 12/8 >>   Dose adjustments this admission:   Microbiology results: 12/8 BCx:    Thank you for allowing pharmacy to be a part of this patient's care.  Albertina Parr, PharmD., BCPS, BCCCP Clinical Pharmacist Please refer to [facility] for unit-specific pharmacist

## 2021-10-17 NOTE — ED Notes (Signed)
Rt notified of neb order

## 2021-10-17 NOTE — ED Notes (Signed)
Patient transported to Ultrasound 

## 2021-10-17 NOTE — Progress Notes (Signed)
CSW went in to meet with pt and dtr to complete SDOH screen.  Unable to complete as patient feeling very ill and planning to go to ED following TOC visit to be evaluated.  Jorge Ny, LCSW Clinical Social Worker [facility] Desk#: [phone] Cell#: [phone]

## 2021-10-17 NOTE — Plan of Care (Signed)
Received patient from ED alert and oriented x4, no acute distress noted. Daughter at bedside. Patient settled into room, assessment completed. Will continue to monitor.

## 2021-10-18 ENCOUNTER — Telehealth: Payer: Self-pay | Admitting: Oncology

## 2021-10-18 DIAGNOSIS — E43 Unspecified severe protein-calorie malnutrition: Secondary | ICD-10-CM | POA: Insufficient documentation

## 2021-10-18 DIAGNOSIS — J189 Pneumonia, unspecified organism: Secondary | ICD-10-CM | POA: Diagnosis not present

## 2021-10-18 DIAGNOSIS — Z515 Encounter for palliative care: Secondary | ICD-10-CM

## 2021-10-18 DIAGNOSIS — I4891 Unspecified atrial fibrillation: Secondary | ICD-10-CM

## 2021-10-18 DIAGNOSIS — N179 Acute kidney failure, unspecified: Secondary | ICD-10-CM

## 2021-10-18 DIAGNOSIS — Z7189 Other specified counseling: Secondary | ICD-10-CM

## 2021-10-18 DIAGNOSIS — I5042 Chronic combined systolic (congestive) and diastolic (congestive) heart failure: Secondary | ICD-10-CM

## 2021-10-18 LAB — CBC WITH DIFFERENTIAL/PLATELET
Abs Immature Granulocytes: 0.04 10*3/uL (ref 0.00–0.07)
Basophils Absolute: 0 10*3/uL (ref 0.0–0.1)
Basophils Relative: 0 %
Eosinophils Absolute: 0 10*3/uL (ref 0.0–0.5)
Eosinophils Relative: 0 %
HCT: 35.4 % — ABNORMAL LOW (ref 39.0–52.0)
Hemoglobin: 11.6 g/dL — ABNORMAL LOW (ref 13.0–17.0)
Immature Granulocytes: 1 %
Lymphocytes Relative: 4 %
Lymphs Abs: 0.2 10*3/uL — ABNORMAL LOW (ref 0.7–4.0)
MCH: 30.7 pg (ref 26.0–34.0)
MCHC: 32.8 g/dL (ref 30.0–36.0)
MCV: 93.7 fL (ref 80.0–100.0)
Monocytes Absolute: 0.1 10*3/uL (ref 0.1–1.0)
Monocytes Relative: 1 %
Neutro Abs: 6.1 10*3/uL (ref 1.7–7.7)
Neutrophils Relative %: 94 %
Platelets: 266 10*3/uL (ref 150–400)
RBC: 3.78 MIL/uL — ABNORMAL LOW (ref 4.22–5.81)
RDW: 13.6 % (ref 11.5–15.5)
WBC: 6.5 10*3/uL (ref 4.0–10.5)
nRBC: 0 % (ref 0.0–0.2)

## 2021-10-18 LAB — BASIC METABOLIC PANEL
Anion gap: 13 (ref 5–15)
BUN: 63 mg/dL — ABNORMAL HIGH (ref 8–23)
CO2: 27 mmol/L (ref 22–32)
Calcium: 8 mg/dL — ABNORMAL LOW (ref 8.9–10.3)
Chloride: 94 mmol/L — ABNORMAL LOW (ref 98–111)
Creatinine, Ser: 1.96 mg/dL — ABNORMAL HIGH (ref 0.61–1.24)
GFR, Estimated: 34 mL/min — ABNORMAL LOW (ref >60)
Glucose, Bld: 266 mg/dL — ABNORMAL HIGH (ref 70–99)
Potassium: 5.2 mmol/L — ABNORMAL HIGH (ref 3.5–5.1)
Sodium: 134 mmol/L — ABNORMAL LOW (ref 135–145)

## 2021-10-18 LAB — GLUCOSE, CAPILLARY
Glucose-Capillary: 242 mg/dL — ABNORMAL HIGH (ref 70–99)
Glucose-Capillary: 172 mg/dL — ABNORMAL HIGH (ref 70–99)
Glucose-Capillary: 216 mg/dL — ABNORMAL HIGH (ref 70–99)
Glucose-Capillary: 210 mg/dL — ABNORMAL HIGH (ref 70–99)

## 2021-10-18 LAB — HIV ANTIBODY (ROUTINE TESTING W REFLEX): HIV Screen 4th Generation wRfx: NONREACTIVE

## 2021-10-18 MED ORDER — INSULIN ASPART 100 UNIT/ML IJ SOLN
0.0000 [IU] | Freq: Three times a day (TID) | INTRAMUSCULAR | Status: DC
  Administered 2021-10-18: 2 [IU] via SUBCUTANEOUS
  Administered 2021-10-18: 1 [IU] via SUBCUTANEOUS
  Administered 2021-10-18: 3 [IU] via SUBCUTANEOUS
  Administered 2021-10-19: 1 [IU] via SUBCUTANEOUS
  Administered 2021-10-19: 2 [IU] via SUBCUTANEOUS
  Administered 2021-10-19: 1 [IU] via SUBCUTANEOUS
  Administered 2021-10-20: 2 [IU] via SUBCUTANEOUS
  Administered 2021-10-20: 1 [IU] via SUBCUTANEOUS
  Administered 2021-10-21: 2 [IU] via SUBCUTANEOUS
  Administered 2021-10-21: 1 [IU] via SUBCUTANEOUS

## 2021-10-18 MED ORDER — LACTATED RINGERS IV SOLN: INTRAVENOUS | Status: AC

## 2021-10-18 MED ORDER — CHLORHEXIDINE GLUCONATE CLOTH 2 % EX PADS
6.0000 | MEDICATED_PAD | Freq: Every day | CUTANEOUS | Status: DC
  Administered 2021-10-18 – 2021-10-21 (×4): 6 via TOPICAL

## 2021-10-18 MED ORDER — ENSURE ENLIVE PO LIQD
237.0000 mL | Freq: Three times a day (TID) | ORAL | Status: DC
  Administered 2021-10-18 – 2021-10-21 (×9): 237 mL via ORAL

## 2021-10-18 MED ORDER — ADULT MULTIVITAMIN W/MINERALS CH
1.0000 | ORAL_TABLET | Freq: Every day | ORAL | Status: DC
  Administered 2021-10-18 – 2021-10-21 (×4): 1 via ORAL
  Filled 2021-10-18 (×4): qty 1

## 2021-10-18 NOTE — Telephone Encounter (Signed)
Patient's 12/12, 12/14 Appt's Canceled due to being Inpatient

## 2021-10-18 NOTE — Telephone Encounter (Signed)
Patient is still currently admitted in the hospital.

## 2021-10-18 NOTE — Progress Notes (Addendum)
HD#1 Subjective:  Overnight Events: NAEO   Says that he is feeling fine today. He was feeling tired this morning, not a whole lot of appetite yet. He says nobody needs to worry much about him he will be fine. He is ok with talking to the palliative care team.  Objective:  Vital signs in last 24 hours: Vitals:   10/18/21 0400 10/18/21 0722 10/18/21 1157 10/18/21 1211  BP:  (!) 111/55 (!) 101/56   Pulse:  70 71   Resp:  14 19   Temp: 98.4 F (36.9 C) 98.4 F (36.9 C) 98.8 F (37.1 C)   TempSrc: Oral Oral Oral   SpO2:  97% 90% 94%  Weight:      Height:       Supplemental O2: Nasal Cannula SpO2: 94 % O2 Flow Rate (L/min): 4 L/min (pt was on 4 lpm)   Physical Exam:  Constitutional: chronically ill appearing elderly man resting comfortably in bed, in no acute distress Cardiovascular: regular rate and rhythm, no m/r/g Pulmonary/Chest: normal work of breathing on 3 L, speaking in full sentences, crackles in the RLL base Abdominal: soft, non-tender, non-distended Neurological: alert and answering questions appropriately Skin: warm and dry Psych: normal affect  Filed Weights   10/17/21 1833  Weight: 54.4 kg     Intake/Output Summary (Last 24 hours) at 10/18/2021 1343 Last data filed at 10/18/2021 1200 Gross per 24 hour  Intake 515.19 ml  Output 700 ml  Net -184.81 ml   Net IO Since Admission: -184.81 mL [10/18/21 1343]  Pertinent Labs: CBC Latest Ref Rng & Units 10/18/2021 10/17/2021 10/17/2021  WBC 4.0 - 10.5 K/uL 6.5 - -  Hemoglobin 13.0 - 17.0 g/dL 11.6(L) 12.2(L) 15.0  Hematocrit 39.0 - 52.0 % 35.4(L) 36.0(L) 44.0  Platelets 150 - 400 K/uL 266 - -    CMP Latest Ref Rng & Units 10/18/2021 10/17/2021 10/17/2021  Glucose 70 - 99 mg/dL 266(H) - 83  BUN 8 - 23 mg/dL 63(H) - 58(H)  Creatinine 0.61 - 1.24 mg/dL 1.96(H) - 2.20(H)  Sodium 135 - 145 mmol/L 134(L) 135 132(L)  Potassium 3.5 - 5.1 mmol/L 5.2(H) 4.4 4.7  Chloride 98 - 111 mmol/L 94(L) - 92(L)  CO2 22 - 32  mmol/L 27 - -  Calcium 8.9 - 10.3 mg/dL 8.0(L) - -  Total Protein 6.5 - 8.1 g/dL - - -  Total Bilirubin 0.3 - 1.2 mg/dL - - -  Alkaline Phos 38 - 126 U/L - - -  AST 15 - 41 U/L - - -  ALT 0 - 44 U/L - - -    Imaging: US RENAL  Result Date: 10/17/2021 CLINICAL DATA:  Acute kidney insufficiency EXAM: RENAL / URINARY TRACT ULTRASOUND COMPLETE COMPARISON:  None. FINDINGS: Right Kidney: Renal measurements: 10.3 x 5.8 x 6.4 cm = volume: 200 mL. Mildly increased echogenicity. No hydronephrosis, nephrolithiasis or suspicious mass identified. Left Kidney: Renal measurements: 10.7 x 5.8 x 5.3 cm = volume: 169 mL. Mildly increased echogenicity. No hydronephrosis or nephrolithiasis. Anechoic cysts in the upper pole measuring up to 6 x 5.5 cm and 2.5 x 2.9 cm. Bladder: Nondistended with no obvious mass visualized. Other: Prostate gland is enlarged measuring 5.2 x 4.4 x 4.3 cm. IMPRESSION: 1. Mildly increased echogenicity of the kidneys suggesting medical renal disease. No hydronephrosis. 2. Left renal cysts. 3. Prostatomegaly. Electronically Signed   By: Ofilia Neas M.D.   On: 10/17/2021 17:38    Assessment/Plan:   Principal Problem:  CAP (community acquired pneumonia) Active Problems:   Chronic combined systolic and diastolic CHF (congestive heart failure) ([place])   Essential hypertension   Diabetes mellitus type 2 in nonobese (HCC)   Atrial fibrillation with RVR ([place])   Moderate protein-calorie malnutrition ([place])   Patient Summary: Kevin [name] is a 80 y.o. with a pertinent PMH of of atrial fibrillation, HFrEF s/p AICD, MI, stage IIB melanoma,  HTN, T2DM who was admitted for postviral bacterial pneumonia and an AKI.  Likely aspiration pneumonia  Patient saturating well on 2-3 liters. No fevers, WBC WNL. Will continue with antibiotic therapy at this time. However given patient's slow swallow on exam and poor PO intake there is concern for aspiration/functional decline. Will consult both  SLP and palliative care today.  - continue unasyn for 5 days - slp consult - palliative consult - wean supplemental oxygen as able  COPD Suspect current respiratory difficulties are related to pneumonia rather than COPD exacerbation, will not give further steroids at this time  - breo ellipta daily - albuterol Q4 PRN - incruse ellitpa daily  AKI on CKD Cr has slightly improved to 1.96 from 2.2. Patient's renal ultrasound notable for medical renal disease but no hydronephrosis. Patient was very dry on exam and endorses poor PO intake. Will give gentle fluids today and continue to monitor. - daily bmp - avoid nephrotoxic medications - fluids 75/hr 12 hours  Mild hyponatremia Likely related to acute pulmonary process vs malnutrition - daily bmp  Chronic heart failure with reduced ejection fraction  Atrial fibrillation  CHADS2VASC 4. Rate controlled. Euvolemic, BNP down to 419 from 1250 last admission. Will continue to monitor carefully in the setting of fluid administration as above. -Hold torsemide, entresto, and digoxin for low-normal blood pressures and AKI. Resume as able -Continue lipitor -Continue amiodarone and metoprolol -Continue eliquis. May need to consider lower dosing if renal function does not improve   Type 2 Diabetes Mellitus -Hold metformin in the setting of AKI and recent weight loss -Jardiance discontinued by cardiology out of concern for early DKA during his last ED visit -ssi 0-6 with meals   Physical deconditioning.  Suspect this is driven by acute infection and multiple comorbidities. - PT/OT consulted for further evaluation to assist with disposition planning - nutrition consult   Anemia Hemoglobin mildly low at 11-12. 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

## 2021-10-18 NOTE — Progress Notes (Signed)
Initial Nutrition Assessment  DOCUMENTATION CODES:   Severe malnutrition in context of chronic illness  INTERVENTION:   Recommend liberalizing pt diet to regular due to malnutrition. Received ok from MD. Encourage good PO intake Multivitamin w/ minerals daily Ensure Enlive po TID, each supplement provides 350 kcal and 20 grams of protein  NUTRITION DIAGNOSIS:   Severe Malnutrition related to chronic illness (COPD, CHF) as evidenced by severe fat depletion, severe muscle depletion, percent weight loss.  GOAL:   Patient will meet greater than or equal to 90% of their needs  MONITOR:   PO intake, Supplement acceptance, Weight trends, Labs  REASON FOR ASSESSMENT:   Consult Assessment of nutrition requirement/status  ASSESSMENT:   80 y.o. male presented to the ED after post-hospital visit with cardiology. Pt recently admitted 11/27-12/02 with A-fib, COPD exacerbation, influenza, and decrease in EF. PMH includes COPD, CHF, DM, and HTN. Pt admitted with pneumonia, AKI, and encephalopathy.   Pt reports that his appetite has been good at home and that his daughters do the cooking for him. Pt stated that they will fix something for him and if he does not like it or does not want it he just wont eat it. Pt said that he will eat when he is hungry. Pt reports a typical intake of: Breakfast: sausage something Lunch: Ham, chicken Dinner: chicken, lots of beans Pt reports that he drinks 1 ONS per day at home, he prefers the chocolate or strawberry kind.  Pt declines difficulty chewing or swallowing; pt with no top teeth, states that he just uses his gums.  RD observed pt breakfast tray in room, < 25% of tray consumed; lunch tray untouched.   Pt unable to provide UBW, but knows that he has lost a lot of weight. Per EMR, pt has had 13.7% weight loss in ~1 month, which is clinically significant for time frame. Used weight on 12/08 from doctors office because it seems to be a measured weight  versus the most recent weight seems to be stated. Pt reports that he uses a ran to stabilize himself when walking, but no other assistance.  Discussed adding ONS while here; pt agreeable.  Medications reviewed and include: SSI 0-6 units TID, IV antibiotics Labs reviewed: Potassium 5.2, BUN 63, Creatinine 1.96    NUTRITION - FOCUSED PHYSICAL EXAM:  Flowsheet Row Most Recent Value  Orbital Region Severe depletion  Upper Arm Region Moderate depletion  Thoracic and Lumbar Region Moderate depletion  Buccal Region Severe depletion  Temple Region Severe depletion  Clavicle Bone Region Severe depletion  Clavicle and Acromion Bone Region Severe depletion  Scapular Bone Region Severe depletion  Dorsal Hand Severe depletion  Patellar Region Severe depletion  Anterior Thigh Region Severe depletion  Posterior Calf Region Severe depletion  Edema (RD Assessment) None  Hair Reviewed  Eyes Reviewed  Mouth Reviewed  Skin Reviewed  Nails Reviewed       Diet Order:   Diet Order             Diet 2 gram sodium Room service appropriate? Yes; Fluid consistency: Thin; Fluid restriction: 1800 mL Fluid  Diet effective now                   EDUCATION NEEDS:   No education needs have been identified at this time  Skin:  Skin Assessment: Reviewed RN Assessment  Last BM:  No Documentation  Height:   Ht Readings from Last 1 Encounters:  10/17/21 5\' 6"  (1.676 m)  Weight:   Wt Readings from Last 1 Encounters:  10/17/21 54.4 kg    Ideal Body Weight:  64.6 kg  BMI:  Body mass index is 19.37 kg/m.  Estimated Nutritional Needs:   Kcal:  1700-1900  Protein:  85-100 gram  Fluid:  > 1.7 L    [place] Louie Casa, RD, LDN Clinical Dietitian See [facility] for contact information.

## 2021-10-18 NOTE — Consult Note (Signed)
Consultation Note Date: 10/18/2021   Patient Name: Kevin [name]  DOB: 1941/07/18  MRN: [id number]  Age / Sex: 80 y.o., male  PCP: [name], PA-C Referring Physician: Axel Filler, *  Reason for Consultation: Establishing goals of care "Elderly man with multiorgan co-morbidities at risk for continued decline despite aggressive medical care. Needs more support at home when ready for discharge."  HPI/Patient Profile: 80 y.o. male  with past medical history of HFrEF, atrial fibrillation, and COPD. He was recently hospitalized with influenza 10/06/21 - 10/11/21 for multiple issues including new decrease in EF, atrial- fibrillation, and COPD exacerbation, and influenza.  Daughter reported decreased appetite, fatigue, and general malaise as well as declining functional status at home.     He presented back to the emergency department on 10/17/2021 at the discretion of the cardiology office after having been there for a post-hospital visit.  In the ED, chest x-ray showed new patchy right bibasilar airspace opacity concerning for pneumonia. Labs significant for creatinine of 2.10. Admitted to IMTS with postviral bacterial pneumonia with associated AKI.   Clinical Assessment and Goals of Care: I have reviewed medical records including EPIC notes, labs and imaging, and met at bedside with patient  to discuss diagnosis, prognosis, GOC, EOL wishes, disposition, and options. He is alert and oriented. He tells me he feels "ok". He denies acute complaints other then nausea with the smell of most foods.   I introduced Palliative Medicine as specialized medical care for people living with serious illness. It focuses on providing relief from the symptoms and stress of a serious illness.   We discussed a brief life review of the patient. His wife passed away 3 years ago. He has 2 daughters, Kevin [name] and Kevin [name]. He  tells me that [place] handles his finances, while [place] handles his medications. He has lived with Kevin [name] since his wife died.    I asked Kevin [name] what he understood about his current medical situation. He tells me that he does not understand much. I attempted to provide education on his medical diagnoses and prognosis, but he tells me he is not interested in the specifics. He states he trusts the medical team and will follow their recommendations. I asked patient what is important to him. He states he wants to be at home. He also states that "when it's my time, the [name] will take me no matter what".   I spoke with each daughter separately and at length by phone. Introduced myself and the role of Palliative Medicine. We discussed his current illness and what it means in the larger context of his ongoing co-morbidities. Natural disease trajectory of heart failure was discussed. Both daughters verbalizes understanding of his medical situation. Kevin [name] states, " I know he's tired, but it's not his time to die yet". She shares that she is still grieving the loss of her mother as well as her son who died tragically in a car accident. She is not ready to think about the loss of her father.  Prior to his previous hospitalization, Mr. Blank had relatively good functional status according to his daughters. He was ambulatory without assistance and independent with his ADLs. He enjoyed sitting on his front porch and riding in the car on errands or trips to [place]. Daughters agree that the goal of care is get him back to his previous level of functioning as much as possible. I provided education that based on the trajectory of advanced heart failure, it may not be possible for him to return home to his previous level of functioning.   Daughters express their feeling that patient's decline post discharge was more so due to medication issues rather than worsening heart failure. They express much frustration that  there may have been medication interactions that contributed to his state of decline post discharge. They also express frustration that patient was discharged from the hospital too soon last time. Daughters agree that they want him to be at home. They would not want him in a SNF, even for rehab.   The difference between aggressive medical intervention and comfort care was considered. With Kevin [name], I introduced the concept of a comfort path to family, emphasizing that this path involves de-escalating full scope medical interventions, allowing a natural course to occur. Discussed that the goal is comfort and dignity rather than cure/prolonging life. I also provided education on home hospice services, and Kevin [name] was receptive. However, Kevin [name] was perseverating on the issue of medication interactions that it did not seem appropriate to bring up hospice.   Questions and concerns were addressed.  The family was encouraged to call with questions or concerns.    Primary decision maker: Patient has low health literacy (unable to read or write) and would need support from his 2 daughters for any complex medical decisions.     SUMMARY OF RECOMMENDATIONS   Continue current care Daughters are hopeful that patient will return to previous functional status as much as possible Goal of care is to return home when medically stable and optimized PMT will continue to follow   Code Status/Advance Care Planning: DNR  Additional Recommendations (Limitations, Scope, Preferences): Full Scope Treatment  Psycho-social/Spiritual:  Created space and opportunity for patient and family to express thoughts and feelings regarding patient's current medical situation.  Emotional support provided   Prognosis:  < 6 months  Discharge Planning: To Be Determined      Primary Diagnoses: Present on Admission:  CAP (community acquired pneumonia)  Chronic combined systolic and diastolic CHF (congestive heart failure) (HCC)   Essential hypertension  Atrial fibrillation with RVR ([place])  Moderate protein-calorie malnutrition ([place])   I have reviewed the medical record, interviewed the patient and family, and examined the patient. The following aspects are pertinent.  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) ([place])      Family History  Problem Relation Age of Onset   Diabetes Mellitus II Father    Breast cancer Mother    Ovarian cancer Sister    Cancer Brother    Cancer Maternal Uncle    Breast cancer Niece  Cancer Brother    Scheduled Meds:  amiodarone  200 mg Oral BID   apixaban  5 mg Oral BID   atorvastatin  80 mg Oral QHS   Chlorhexidine Gluconate Cloth  6 each Topical Daily   feeding supplement  237 mL Oral TID BM   fluticasone furoate-vilanterol  1 puff Inhalation Daily   insulin aspart  0-6 Units Subcutaneous TID WC   metoprolol succinate  12.5 mg Oral Daily   multivitamin with minerals  1 tablet Oral Daily   umeclidinium bromide  1 puff Inhalation Daily   Continuous Infusions:  ampicillin-sulbactam (UNASYN) IV 3 g (10/18/21 0602)   lactated ringers 75 mL/hr at 10/18/21 0919   PRN Meds:.albuterol   No Known Allergies Review of Systems  Gastrointestinal:  Positive for nausea.   Physical Exam Constitutional:      General: He is not in acute distress.    Appearance: He is ill-appearing.  Cardiovascular:     Comments: Paced rhythm Pulmonary:     Effort: Pulmonary effort is normal.  Neurological:     Mental Status: He is alert and oriented to person, place, and time.  Psychiatric:        Speech:  Speech is tangential.    Vital Signs: BP (!) 116/47 (BP Location: Left Arm)   Pulse 72   Temp 98.8 F (37.1 C) (Oral)   Resp 17   Ht _0  (1.676 m)   Wt 54.4 kg   SpO2 97%   BMI 19.37 kg/m  Pain Scale: 0-10   Pain Score: 0-No pain   SpO2: SpO2: 97 % O2 Device:SpO2: 97 % O2 Flow Rate: .O2 Flow Rate (L/min): 4 L/min  IO: Intake/output summary:  Intake/Output Summary (Last 24 hours) at 10/18/2021 1510 Last data filed at 10/18/2021 1200 Gross per 24 hour  Intake 515.19 ml  Output 700 ml  Net -184.81 ml    LBM:   Baseline Weight: Weight: 54.4 kg Most recent weight: Weight: 54.4 kg      Palliative Assessment/Data: PPS 40%     Time In: 1530 Time Out: 1645 Time Total: 75 minutes Greater than 50%  of this time was spent counseling and coordinating care related to the above assessment and plan.  Signed by: Lavena Bullion, NP   Please contact Palliative Medicine Team phone at [phone] for questions and concerns.  For individual provider: See Shea Evans

## 2021-10-18 NOTE — Evaluation (Signed)
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) ([place])    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 CXR 12/8: New patchy right basilar airspace opacity concerning for pneumonia. Aspiration suspected by MD "given patient's slow swallow on exam and poor PO intake". Palliative care has been consulted. PMH: chronic heart failure with reduced ejection fraction, atrial fibrillation, COPD, and recent hospitalization with influenza infection and discharge 12/2.    Assessment / Plan / Recommendation  Clinical Impression  Pt was seen for bedside swallow evaluation and he denied a history of dysphagia. Pt's RN reported that the pt has been tolerating pills and his diet without difficulty today. Oral mechanism exam was [facility] and he was edentulous. Congested  coughing was noted at baseline, and intermittently during the evaluation, but this did not appear to align with p.o. intake. No s/sx of aspiration were noted with solids or liquids even when challenged with larger boluses and rapid intake rate. Mastication and oral clearance were WNL despite edentulous status. Pt's current diet of regular texture solids and thin liquids will be continued. A modified barium swallow study will be conducted to rule out silent aspiration. However, this cannot be conducted today due to radiology's schedule. SLP Visit Diagnosis: Dysphagia, unspecified (R13.10)    Aspiration Risk  Mild aspiration risk    Diet Recommendation Regular;Thin liquid   Liquid Administration via: Cup;Straw Medication Administration: Whole meds with liquid Supervision: Patient able to self feed Compensations: Slow rate;Small sips/bites Postural Changes: Seated upright at 90 degrees    Other  Recommendations Oral Care Recommendations: Oral care BID    Recommendations for follow up therapy are one component of a multi-disciplinary discharge planning process, led by the attending physician.  Recommendations may be updated based on patient status, additional functional criteria and insurance authorization.  Follow up Recommendations  (TBD)      Assistance Recommended at Discharge    Functional Status Assessment Patient has not had a recent decline in their functional status  Frequency and Duration min 1 x/week  1 week       Prognosis Prognosis for Safe Diet Advancement: Good      Swallow Study   General Date of Onset: 10/17/21 HPI: Pt is a 80 year old male who presented seoncdary to declining functional status and increasing shortness of breath. CXR  12/8: New patchy right basilar airspace opacity concerning for pneumonia. Aspiration suspected by MD "given patient's slow swallow on exam and poor PO intake". Palliative care has been consulted. PMH: chronic heart failure with reduced  ejection fraction, atrial fibrillation, COPD, and recent hospitalization with influenza infection and discharge 12/2. Type of Study: Bedside Swallow Evaluation Previous Swallow Assessment: none Diet Prior to this Study: Regular;Thin liquids Temperature Spikes Noted: No Respiratory Status: Nasal cannula History of Recent Intubation: No Behavior/Cognition: Alert;Pleasant mood;Cooperative Oral Cavity Assessment: Within Functional Limits Oral Care Completed by SLP: No Oral Cavity - Dentition: Edentulous Vision: Functional for self-feeding Self-Feeding Abilities: Able to feed self Patient Positioning: Upright in bed;Postural control adequate for testing Baseline Vocal Quality: Normal Volitional Cough: Strong;Congested Volitional Swallow: Able to elicit    Oral/Motor/Sensory Function Overall Oral Motor/Sensory Function: Within functional limits   Ice Chips Ice chips: Within functional limits Presentation: Spoon   Thin Liquid Thin Liquid: Within functional limits Presentation: Straw    Nectar Thick Nectar Thick Liquid: Not tested   Honey Thick Honey Thick Liquid: Not tested   Puree Puree: Within functional limits Presentation: Spoon   Solid     Solid: Within functional limits Presentation: [place] Negus, [place], [place] Office number [phone] Pager [place] 10/18/2021,4:47 PM

## 2021-10-18 NOTE — TOC Initial Note (Signed)
Transition of Care [facility]) - Initial/Assessment Note    Patient Details  Name: Kevin [name] MRN: [id number] Date of Birth: September 30, 1941  [facility]) CM/SW Contact:    Verdell Carmine, RN Phone Number: 10/18/2021, 9:40 AM  Clinical Narrative:                  80 yo patient readmitted after DC on 12/2 for flu, now presents with pneumonia. He was set up with [facility] services, and DME including oxygen on last admit.  May need SNF for reconditioning will follow patient through hospitalization for needs Recommendations, and transitions Expected Discharge Plan: [place] Barriers to Discharge: Continued Medical Work up   Patient Goals and CMS Choice        Expected Discharge Plan and Services Expected Discharge Plan: [place] Acute Care Choice: [place] arrangements for the past 2 months: Single Family Home                                      Prior Living Arrangements/Services Living arrangements for the past 2 months: Single Family Home Lives with:: Adult Children Patient language and need for interpreter reviewed:: Yes        Need for Family Participation in Patient Care: Yes (Comment) Care giver support system in place?: Yes (comment)      Activities of Daily Living Home Assistive Devices/Equipment: Cane (specify quad or straight) ADL Screening (condition at time of admission) Patient's cognitive ability adequate to safely complete daily activities?: Yes Is the patient deaf or have difficulty hearing?: Yes Does the patient have difficulty seeing, even when wearing glasses/contacts?: No Does the patient have difficulty concentrating, remembering, or making decisions?: No Patient able to express need for assistance with ADLs?: Yes Does the patient have difficulty dressing or bathing?: Yes Independently performs ADLs?: Yes (appropriate for developmental age) Does the patient have difficulty  walking or climbing stairs?: Yes Weakness of Legs: Both Weakness of Arms/Hands: Both  Permission Sought/Granted                  Emotional Assessment         Alcohol / Substance Use: Not Applicable Psych Involvement: No (comment)  Admission diagnosis:  SOB (shortness of breath) [R06.02] COPD exacerbation ([place]) [J44.1] CAP (community acquired pneumonia) [J18.9] AKI (acute kidney injury) ([place]) [N17.9] Community acquired pneumonia of right lower lobe of lung [J18.9] Sepsis without acute organ dysfunction, due to unspecified organism [facility]) [A41.9] Patient Active Problem List   Diagnosis Date Noted   CAP (community acquired pneumonia) 10/17/2021   Moderate protein-calorie malnutrition ([place]) 10/17/2021   Atrial fibrillation with RVR ([place])    HFrEF (heart failure with reduced ejection fraction) ([place])    Influenza A 10/06/2021   Macrocytic anemia 07/16/2021   Hypomagnesemia 10/17/2020    Class: Chronic   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 ([place]) 07/29/2020   B12 deficiency anemia 08/19/2018    Class: Chronic   Hypoglycemia 04/21/2018   Chronic combined systolic and diastolic CHF (congestive heart failure) ([place]) 04/21/2018   Essential hypertension 04/21/2018   Diabetes mellitus type 2 in nonobese ([place]) 04/21/2018   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 [place] [address] Phone: [phone] Fax: [phone]     Social Determinants of Health (SDOH) Interventions    Readmission Risk Interventions No flowsheet data found.

## 2021-10-19 DIAGNOSIS — J189 Pneumonia, unspecified organism: Secondary | ICD-10-CM | POA: Diagnosis not present

## 2021-10-19 LAB — BASIC METABOLIC PANEL
Anion gap: 7 (ref 5–15)
BUN: 43 mg/dL — ABNORMAL HIGH (ref 8–23)
CO2: 30 mmol/L (ref 22–32)
Calcium: 8.2 mg/dL — ABNORMAL LOW (ref 8.9–10.3)
Chloride: 98 mmol/L (ref 98–111)
Creatinine, Ser: 1.39 mg/dL — ABNORMAL HIGH (ref 0.61–1.24)
GFR, Estimated: 52 mL/min — ABNORMAL LOW (ref >60)
Glucose, Bld: 190 mg/dL — ABNORMAL HIGH (ref 70–99)
Potassium: 4.2 mmol/L (ref 3.5–5.1)
Sodium: 135 mmol/L (ref 135–145)

## 2021-10-19 LAB — CBC
HCT: 33.9 % — ABNORMAL LOW (ref 39.0–52.0)
Hemoglobin: 11 g/dL — ABNORMAL LOW (ref 13.0–17.0)
MCH: 30.2 pg (ref 26.0–34.0)
MCHC: 32.4 g/dL (ref 30.0–36.0)
MCV: 93.1 fL (ref 80.0–100.0)
Platelets: 265 10*3/uL (ref 150–400)
RBC: 3.64 MIL/uL — ABNORMAL LOW (ref 4.22–5.81)
RDW: 13.6 % (ref 11.5–15.5)
WBC: 9.7 10*3/uL (ref 4.0–10.5)
nRBC: 0 % (ref 0.0–0.2)

## 2021-10-19 LAB — GLUCOSE, CAPILLARY
Glucose-Capillary: 162 mg/dL — ABNORMAL HIGH (ref 70–99)
Glucose-Capillary: 235 mg/dL — ABNORMAL HIGH (ref 70–99)
Glucose-Capillary: 161 mg/dL — ABNORMAL HIGH (ref 70–99)
Glucose-Capillary: 159 mg/dL — ABNORMAL HIGH (ref 70–99)

## 2021-10-19 MED ORDER — SODIUM CHLORIDE 0.9 % IV SOLN
3.0000 g | Freq: Three times a day (TID) | INTRAVENOUS | Status: DC
  Administered 2021-10-19 – 2021-10-21 (×7): 3 g via INTRAVENOUS
  Filled 2021-10-19 (×7): qty 8

## 2021-10-19 NOTE — Progress Notes (Signed)
Pharmacy Antibiotic Note  Kevin [name] is a 80 y.o. male admitted on 10/17/2021 with  aspiration pneumonia .  Pharmacy has been consulted for Unasyn dosing.  WBC mildly elevated. Scr was elevated and CrCl is now > 30 (33.2). Increased the dosing frequency to q8h with the improvement in renal function. Length of therapy should be 5 days and today is day 3.  Plan: -Unasyn 3 gm IV Q 8 hours -Monitor CBC, renal fx, cultures and clinical progress  Height: 5\' 6"  (167.6 cm) Weight: 54.4 kg (120 lb) IBW/kg (Calculated) : 63.8  Temp (24hrs), Avg:98.4 F (36.9 C), Min:97.8 F (36.6 C), Max:98.9 F (37.2 C)  Recent Labs  Lab 10/13/21 2120 10/17/21 1030 10/17/21 1132 10/18/21 0210 10/19/21 0132  WBC 10.4 10.8*  --  6.5 9.7  CREATININE 1.44* 2.10* 2.20* 1.96* 1.39*  LATICACIDVEN  --  1.6  --   --   --      Estimated Creatinine Clearance: 33.2 mL/min (A) (by C-G formula based on SCr of 1.39 mg/dL (H)).    No Known Allergies  Antimicrobials this admission: Unasyn 12/8 >>   Dose adjustments this admission: 12/10 >> increased frequency to q8h from q12h with improvement in CrCl >30  Microbiology results: 12/8 BCx: NGTD   Thank you for allowing pharmacy to be a part of this patient's care.  Varney Daily, PharmD PGY1 Pharmacy Resident  Please check AMION for all [facility] pharmacy phone numbers After 10:00 PM call main pharmacy [phone]

## 2021-10-19 NOTE — Progress Notes (Addendum)
HD#2 Subjective:  Overnight Events: NAEO   Says that he had no issues overnight. He denies any SOB, chest pain.   Objective:  Vital signs in last 24 hours: Vitals:   10/19/21 0400 10/19/21 0719 10/19/21 0840 10/19/21 1129  BP:  (!) 117/53  (!) 111/56  Pulse:  70  70  Resp: 17 16  14   Temp: 98.9 F (37.2 C) 97.8 F (36.6 C)  97.9 F (36.6 C)  TempSrc: Oral Oral  Oral  SpO2:  97% 95% 100%  Weight:      Height:       Supplemental O2: Nasal Cannula SpO2: 100 % O2 Flow Rate (L/min): 3 L/min FiO2 (%): 32 %   Physical Exam:  Constitutional: chronically ill appearing elderly man resting comfortably in bed, in no acute distress Cardiovascular: regular rate and rhythm, no m/r/g, no edema of BLE  Pulmonary/Chest: normal work of breathing on 3L [place] speaking in full sentences, CTAB Abdominal: soft, non-tender, non-distended Neurological: alert and answering questions appropriately Skin: warm and dry Psych: normal affect  Filed Weights   10/17/21 1833  Weight: 54.4 kg     Intake/Output Summary (Last 24 hours) at 10/19/2021 1228 Last data filed at 10/19/2021 0900 Gross per 24 hour  Intake 1619.46 ml  Output 400 ml  Net 1219.46 ml    Net IO Since Admission: 1,034.65 mL [10/19/21 1228]  Pertinent Labs: CBC Latest Ref Rng & Units 10/19/2021 10/18/2021 10/17/2021  WBC 4.0 - 10.5 K/uL 9.7 6.5 -  Hemoglobin 13.0 - 17.0 g/dL 11.0(L) 11.6(L) 12.2(L)  Hematocrit 39.0 - 52.0 % 33.9(L) 35.4(L) 36.0(L)  Platelets 150 - 400 K/uL 265 266 -    CMP Latest Ref Rng & Units 10/19/2021 10/18/2021 10/17/2021  Glucose 70 - 99 mg/dL 190(H) 266(H) -  BUN 8 - 23 mg/dL 43(H) 63(H) -  Creatinine 0.61 - 1.24 mg/dL 1.39(H) 1.96(H) -  Sodium 135 - 145 mmol/L 135 134(L) 135  Potassium 3.5 - 5.1 mmol/L 4.2 5.2(H) 4.4  Chloride 98 - 111 mmol/L 98 94(L) -  CO2 22 - 32 mmol/L 30 27 -  Calcium 8.9 - 10.3 mg/dL 8.2(L) 8.0(L) -  Total Protein 6.5 - 8.1 g/dL - - -  Total Bilirubin 0.3 - 1.2 mg/dL -  - -  Alkaline Phos 38 - 126 U/L - - -  AST 15 - 41 U/L - - -  ALT 0 - 44 U/L - - -    Imaging: No results found.  Assessment/Plan:   Principal Problem:   CAP (community acquired pneumonia) Active Problems:   Chronic combined systolic and diastolic CHF (congestive heart failure) (HCC)   Essential hypertension   Diabetes mellitus type 2 in nonobese (HCC)   Atrial fibrillation with RVR (HCC)   Moderate protein-calorie malnutrition (HCC)   Protein-calorie malnutrition, severe   Patient Summary: Kevin [name] is a 80 y.o. with a pertinent PMH of of atrial fibrillation, HFrEF s/p AICD, MI, stage IIB melanoma,  HTN, T2DM who was admitted for postviral bacterial pneumonia and an AKI.  Likely aspiration pneumonia  Patient saturating well on 2-3 liters. No fevers, WBC WNL. Will continue with antibiotic therapy at this time. SLP evaluated patient and recommended he continues with a regular diet and thin liquids. They did recommend a mBSS to rule out silent aspiration but this was not able to be done due to radiology scheduling.  - continue unasyn for 5 days of total antibiotics. S/p 1 d of CTX (D3/5) - palliative  consulted, working with family regarding disposition and quality of life  - OOB, IS, flutter valve, mobilize  - wean supplemental oxygen as able, may require home O2   COPD Suspect current respiratory difficulties are related to above rather than COPD exacerbation, will hold on additional steroids at this time  - breo ellipta daily - albuterol Q4 PRN - incruse ellitpa daily  AKI on CKD Cr has improved and appears closer to baseline. AKI likely 2/2 to prerenal etiology in the setting of poor oral intake.  -Continue to encourage oral intake, meal supplements  - daily bmp - avoid nephrotoxic medications  Mild hyponatremia Resolved - daily bmp  Chronic heart failure with reduced ejection fraction  Atrial fibrillation  CHADS2VASC 4. Rate controlled. Euvolemic on exam,  BNP down to 419 from 1250 last admission. Will continue to monitor carefully in the setting of fluid administration as above. -Hold torsemide, entresto, and digoxin for low-normal blood pressures and AKI. SBP in 30S to 923R systolic  -Continue lipitor -Continue amiodarone and metoprolol succinate, will follow up with pharmacy to see if patient has completed amiodarone load and can transition to maintenance dosing.  -Continue eliquis   Type 2 Diabetes Mellitus -Hold metformin in the setting of AKI and recent weight loss -Jardiance discontinued by cardiology out of concern for early DKA during his last ED visit --ssi with meals   Physical deconditioning.  Suspect this is driven by acute infection and multiple comorbidities. - PT/OT consulted for further evaluation to assist with disposition planning - nutrition consult   Anemia Hemoglobin mildly low at 11-12. - daily CBC - transfuse <7  Stage II Melanoma. 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

## 2021-10-19 NOTE — Evaluation (Signed)
Physical Therapy Evaluation Patient Details Name: Kevin [name] MRN: [id number] DOB: 07/22/1941 Today's Date: 10/19/2021  History of Present Illness  80 y.o. male who presented with declining functional status and increasing shortness of breath. Pt also with one fall at home prior to admission. Chest Xray shows Pnuemonia.  PMH:  atrial fibrillation s/p PPM, MI, stage IIB melanoma, congestive HF, HTN, T2DM    no prec  Clinical Impression  Patient presents with dependencies in gait and mobility due to generalized weakness, decreased balance and decreased endurance.  Patient will benefit from continued PT to address deficits and increase independence in mobility.         Recommendations for follow up therapy are one component of a multi-disciplinary discharge planning process, led by the attending physician.  Recommendations may be updated based on patient status, additional functional criteria and insurance authorization.  Follow Up Recommendations Home health PT    Assistance Recommended at Discharge Frequent or constant Supervision/Assistance  Functional Status Assessment Patient has had a recent decline in their functional status and demonstrates the ability to make significant improvements in function in a reasonable and predictable amount of time.  Equipment Recommendations  Rolling walker (2 wheels)    Recommendations for Other Services       Precautions / Restrictions Precautions Precautions: Fall Precaution Comments: monitor vitals; does not wear O2 at baseline Restrictions Weight Bearing Restrictions: No      Mobility  Bed Mobility Overal bed mobility: Modified Independent       Supine to sit: Modified independent (Device/Increase time) Sit to supine: Modified independent (Device/Increase time)   General bed mobility comments: Extra time and use of bed rails and controls, no assistance needed.    Transfers Overall transfer level: Needs assistance Equipment  used: Rolling walker (2 wheels) Transfers: Sit to/from Stand Sit to Stand: Min guard           General transfer comment: min guard for safety, time to gain balance and posture    Ambulation/Gait Ambulation/Gait assistance: Min guard Gait Distance (Feet): 100 Feet Assistive device: Rolling walker (2 wheels) Gait Pattern/deviations: Step-through pattern;Drifts right/left;Narrow base of support Gait velocity: decreased        Stairs            Wheelchair Mobility    Modified Rankin (Stroke Patients Only)       Balance Overall balance assessment: Needs assistance Sitting-balance support: No upper extremity supported;Feet supported Sitting balance-Leahy Scale: Good     Standing balance support: Reliant on assistive device for balance;Bilateral upper extremity supported;During functional activity Standing balance-Leahy Scale: Poor Standing balance comment: 1 noted LoB, easily able to correct with minimal assist.                             Pertinent Vitals/Pain Pain Assessment: No/denies pain    Home Living Family/patient expects to be discharged to:: Private residence Living Arrangements: Children Available Help at Discharge: Family;Available PRN/intermittently Type of Home: House Home Access: Stairs to enter Entrance Stairs-Rails: Psychiatric nurse of Steps: 3   Home Layout: One level Home Equipment: Cane - single Barista (2 wheels);BSC/3in1      Prior Function Prior Level of Function : Independent/Modified Independent             Mobility Comments: Reports using walking stick for all mobility ADLs Comments: Indep     Hand Dominance   Dominant Hand: Right    Extremity/Trunk Assessment  Upper Extremity Assessment Upper Extremity Assessment: Overall WFL for tasks assessed    Lower Extremity Assessment Lower Extremity Assessment: Generalized weakness    Cervical / Trunk Assessment Cervical /  Trunk Assessment: Normal  Communication   Communication: No difficulties  Cognition Arousal/Alertness: Awake/alert Behavior During Therapy: WFL for tasks assessed/performed Overall Cognitive Status: Within Functional Limits for tasks assessed                                 General Comments: Pt using humor throughout session, appears at baseline cognitively, has 2 daughters who assist with higher level cognitive tasks, such as medication management.        General Comments General comments (skin integrity, edema, etc.): On entry O2 99% on 4L, down graded to 2L, maintaining 97%, O2 removed for mobility pt desatting to 89% on RA with mobility, 2L replaced and pt quickly recovered to 97%    Exercises General Exercises - Lower Extremity Ankle Circles/Pumps: AROM;Both;10 reps;Seated Gluteal Sets: AROM;Both;10 reps;Seated Long Arc Quad: AROM;Both;10 reps;Seated Hip Flexion/Marching: AROM;Both;10 reps;Seated   Assessment/Plan    PT Assessment Patient needs continued PT services  PT Problem List Decreased strength;Decreased mobility;Decreased balance;Decreased activity tolerance;Decreased knowledge of use of DME       PT Treatment Interventions DME instruction;Therapeutic activities;Gait training;Therapeutic exercise;Patient/family education;Functional mobility training;Stair training    PT Goals (Current goals can be found in the Care Plan section)  Acute Rehab PT Goals Patient Stated Goal: go h ome PT Goal Formulation: With patient/family Time For Goal Achievement: 11/02/21 Potential to Achieve Goals: Good    Frequency Min 3X/week   Barriers to discharge        Co-evaluation               AM-PAC PT "6 Clicks" Mobility  Outcome Measure Help needed turning from your back to your side while in a flat bed without using bedrails?: None Help needed moving from lying on your back to sitting on the side of a flat bed without using bedrails?: None Help needed  moving to and from a bed to a chair (including a wheelchair)?: A Little Help needed standing up from a chair using your arms (e.g., wheelchair or bedside chair)?: A Little Help needed to walk in hospital room?: A Little Help needed climbing 3-5 steps with a railing? : A Little 6 Click Score: 20    End of Session Equipment Utilized During Treatment: Gait belt;Oxygen Activity Tolerance: Patient tolerated treatment well Patient left: in bed;with call bell/phone within reach;with bed alarm set;with family/visitor present   PT Visit Diagnosis: Muscle weakness (generalized) (M62.81);Difficulty in walking, not elsewhere classified (R26.2);Unsteadiness on feet (R26.81);Other abnormalities of gait and mobility (R26.89)    Time: 9983-3825 PT Time Calculation (min) (ACUTE ONLY): 21 min   Charges:   PT Evaluation $PT Eval Moderate Complexity: 1 Mod          10/19/2021 Margie, PT Acute Rehabilitation Services Pager:  [phone] Office:  [phone]   Shanna Cisco 10/19/2021, 3:58 PM

## 2021-10-19 NOTE — Evaluation (Signed)
Occupational Therapy Evaluation Patient Details Name: Kevin [name] MRN: [id number] DOB: 03/07/1941 Today's Date: 10/19/2021   History of Present Illness 80 y.o. male who presented with declining functional status and increasing shortness of breath. Pt also with one fall at home prior to admission. Chest Xray shows Pnuemonia.  PMH:  atrial fibrillation s/p PPM, MI, stage IIB melanoma, congestive HF, HTN, T2DM    no prec   Clinical Impression   Pt admitted for concerns listed above. PTA pt reported that he was independent with all ADL's and basic IADL's, using a walking stick for functional mobility. At this time pt presents with decreased balance, activity tolerance, strength, and increased pulmonary needs. Pt utilizing the RW for all functional mobility to assist with increasing stability during transfers and ambulation. Pt overall requiring min guard to minA for all ADL's and transfers. Recommending HHOT to maximize pt's independence at home and reduce caregiver burden. OT will follow acutely.       Recommendations for follow up therapy are one component of a multi-disciplinary discharge planning process, led by the attending physician.  Recommendations may be updated based on patient status, additional functional criteria and insurance authorization.   Follow Up Recommendations  Home health OT    Assistance Recommended at Discharge Frequent or constant Supervision/Assistance  Functional Status Assessment  Patient has had a recent decline in their functional status and demonstrates the ability to make significant improvements in function in a reasonable and predictable amount of time.  Equipment Recommendations  None recommended by OT    Recommendations for Other Services       Precautions / Restrictions Precautions Precautions: Fall Precaution Comments: monitor vitals; does not wear O2 at baseline Restrictions Weight Bearing Restrictions: No      Mobility Bed  Mobility Overal bed mobility: Modified Independent                  Transfers Overall transfer level: Needs assistance Equipment used: Rolling walker (2 wheels) Transfers: Sit to/from Stand Sit to Stand: Min guard           General transfer comment: min guard for safety, time to gain balance and posture      Balance Overall balance assessment: Needs assistance Sitting-balance support: Feet supported Sitting balance-Leahy Scale: Good     Standing balance support: Reliant on assistive device for balance Standing balance-Leahy Scale: Poor Standing balance comment: 1 noted LoB, easily able to correct with minimal assist.                           ADL either performed or assessed with clinical judgement   ADL Overall ADL's : Needs assistance/impaired Eating/Feeding: Set up;Sitting   Grooming: Min guard;Standing;Brushing hair;Wash/dry face   Upper Body Bathing: Set up;Sitting   Lower Body Bathing: Minimal assistance;Sitting/lateral leans;Sit to/from stand   Upper Body Dressing : Set up;Sitting   Lower Body Dressing: Minimal assistance;Sitting/lateral leans;Sit to/from stand   Toilet Transfer: Min guard;Ambulation   Toileting- Clothing Manipulation and Hygiene: Minimal assistance;Sit to/from stand       Functional mobility during ADLs: Min guard;Rolling walker (2 wheels) General ADL Comments: Pt with decreased balance, requiring min guard for all standing activities and mobility for safety.     Vision Baseline Vision/History: 1 Wears glasses Ability to See in Adequate Light: 0 Adequate Patient Visual Report: No change from baseline Vision Assessment?: No apparent visual deficits     Perception     Praxis  Pertinent Vitals/Pain Pain Assessment: No/denies pain     Hand Dominance Right   Extremity/Trunk Assessment Upper Extremity Assessment Upper Extremity Assessment: Overall WFL for tasks assessed   Lower Extremity  Assessment Lower Extremity Assessment: Defer to PT evaluation   Cervical / Trunk Assessment Cervical / Trunk Assessment: Normal   Communication Communication Communication: No difficulties   Cognition Arousal/Alertness: Awake/alert Behavior During Therapy: WFL for tasks assessed/performed Overall Cognitive Status: Within Functional Limits for tasks assessed                                 General Comments: Pt using humor throughout session, appears at baseline cognitively, has 2 daughters who assist with higher level cognitive tasks, such as medication management.     General Comments  On entry O2 99% on 4L, down graded to 2L, maintaining 97%, O2 removed for mobility pt desatting to 89% on RA with mobility, 2L replaced and pt quickly recovered to 97%    Exercises     Shoulder Instructions      Home Living Family/patient expects to be discharged to:: Private residence Living Arrangements: Children Available Help at Discharge: Family;Available PRN/intermittently Type of Home: House Home Access: Stairs to enter [employer] of Steps: 3 Entrance Stairs-Rails: Right;Left Home Layout: One level     Bathroom Shower/Tub: Teacher, early years/pre: Standard     Home Equipment: Cane - single Barista (2 wheels);BSC/3in1          Prior Functioning/Environment Prior Level of Function : Independent/Modified Independent             Mobility Comments: Reports using walking stick for all mobility ADLs Comments: Indep        OT Problem List: Decreased strength;Decreased activity tolerance;Impaired balance (sitting and/or standing);Cardiopulmonary status limiting activity;Decreased knowledge of use of DME or AE      OT Treatment/Interventions: Self-care/ADL training;Therapeutic exercise;Energy conservation;DME and/or AE instruction;Therapeutic activities;Patient/family education;Balance training    OT Goals(Current goals can be  found in the care plan section) Acute Rehab OT Goals Patient Stated Goal: To get his independence back OT Goal Formulation: With patient Time For Goal Achievement: 11/02/21 Potential to Achieve Goals: Good ADL Goals Additional ADL Goal #1: Pt will increased standing activity tolerance to 5-7 mins during ADL's/functional mobility to improve overall endurance Additional ADL Goal #2: Pt will verbalize 3 energy conservation techniques that he can use at home. Additional ADL Goal #3: Pt will follow multistep commands 75% of the session with no verbal cuing.  OT Frequency: Min 2X/week   Barriers to D/C:            Co-evaluation              AM-PAC OT "6 Clicks" Daily Activity     Outcome Measure Help from another person eating meals?: A Little Help from another person taking care of personal grooming?: A Little Help from another person toileting, which includes using toliet, bedpan, or urinal?: A Little Help from another person bathing (including washing, rinsing, drying)?: A Little Help from another person to put on and taking off regular upper body clothing?: A Little Help from another person to put on and taking off regular lower body clothing?: A Little 6 Click Score: 18   End of Session Equipment Utilized During Treatment: Rolling walker (2 wheels);Oxygen Nurse Communication: Mobility status  Activity Tolerance: Patient tolerated treatment well Patient left: in bed;with call bell/phone within  reach;with bed alarm set;with family/visitor present  OT Visit Diagnosis: Unsteadiness on feet (R26.81);Other abnormalities of gait and mobility (R26.89);Muscle weakness (generalized) (M62.81)                Time: 2763-9432 OT Time Calculation (min): 19 min Charges:  OT General Charges $OT Visit: 1 Visit OT Evaluation $OT Eval Moderate Complexity: 1 Mod  Roxana Lai H., OTR/L Acute Rehabilitation  Shimshon Narula Elane Yolanda Bonine 10/19/2021, 3:37 PM

## 2021-10-20 LAB — BASIC METABOLIC PANEL
Anion gap: 5 (ref 5–15)
BUN: 28 mg/dL — ABNORMAL HIGH (ref 8–23)
CO2: 32 mmol/L (ref 22–32)
Calcium: 8.3 mg/dL — ABNORMAL LOW (ref 8.9–10.3)
Chloride: 101 mmol/L (ref 98–111)
Creatinine, Ser: 1.35 mg/dL — ABNORMAL HIGH (ref 0.61–1.24)
GFR, Estimated: 53 mL/min — ABNORMAL LOW (ref >60)
Glucose, Bld: 213 mg/dL — ABNORMAL HIGH (ref 70–99)
Potassium: 4 mmol/L (ref 3.5–5.1)
Sodium: 138 mmol/L (ref 135–145)

## 2021-10-20 LAB — CBC
HCT: 33.7 % — ABNORMAL LOW (ref 39.0–52.0)
Hemoglobin: 10.7 g/dL — ABNORMAL LOW (ref 13.0–17.0)
MCH: 30.3 pg (ref 26.0–34.0)
MCHC: 31.8 g/dL (ref 30.0–36.0)
MCV: 95.5 fL (ref 80.0–100.0)
Platelets: 250 10*3/uL (ref 150–400)
RBC: 3.53 MIL/uL — ABNORMAL LOW (ref 4.22–5.81)
RDW: 13.8 % (ref 11.5–15.5)
WBC: 6.8 10*3/uL (ref 4.0–10.5)
nRBC: 0 % (ref 0.0–0.2)

## 2021-10-20 LAB — GLUCOSE, CAPILLARY
Glucose-Capillary: 137 mg/dL — ABNORMAL HIGH (ref 70–99)
Glucose-Capillary: 226 mg/dL — ABNORMAL HIGH (ref 70–99)
Glucose-Capillary: 174 mg/dL — ABNORMAL HIGH (ref 70–99)
Glucose-Capillary: 178 mg/dL — ABNORMAL HIGH (ref 70–99)

## 2021-10-20 MED ORDER — TORSEMIDE 20 MG PO TABS
20.0000 mg | ORAL_TABLET | Freq: Every day | ORAL | Status: DC
  Administered 2021-10-20 – 2021-10-21 (×2): 20 mg via ORAL
  Filled 2021-10-20 (×2): qty 1

## 2021-10-20 NOTE — Progress Notes (Signed)
Daily Progress Note   Patient Name: Kevin [name]       Date: 10/20/2021 DOB: 1941-01-09  Age: 80 y.o. MRN#: [id number] Attending Physician: Axel Filler, * Primary Care Physician: [name], PA-C Admit Date: 10/17/2021   HPI/Patient Profile: 80 y.o. male  with past medical history of HFrEF, atrial fibrillation, and COPD. He was recently hospitalized with influenza 10/06/21 - 10/11/21 for multiple issues including new decrease in EF, atrial- fibrillation, and COPD exacerbation, and influenza.  Daughter reported decreased appetite, fatigue, and general malaise as well as declining functional status at home.     He presented back to the emergency department on 10/17/2021 at the discretion of the cardiology office after having been there for a post-hospital visit.  In the ED, chest x-ray showed new patchy right bibasilar airspace opacity concerning for pneumonia. Labs significant for creatinine of 2.10. Admitted to IMTS with postviral bacterial pneumonia with associated AKI.   Subjective: Patient states he feels good, and is looking forward to going home. He has no acute complaints.   His daughter [name] is at bedside. She feels patient is mostly back to his baseline. Discussed he will likely discharge soon. I offered and explained the option for an outpatient palliative referral to check-in periodically with patient and family and continue goals of care discussions. Discussed that outpatient palliative can also help with options for care if his condition declines in the future. [name] is agreeable to this referral.    Length of Stay: 3   Physical Exam Vitals reviewed.  Constitutional:      General: He is not in acute distress.    Comments: Chronically ill-appearing   Pulmonary:     Effort: Pulmonary effort is normal.  Neurological:     Mental Status: He is alert and oriented to person, place, and time.            Vital Signs: BP 103/67 (BP Location: Left Arm)   Pulse 70   Temp 98 F (36.7 C) (Oral)   Resp 15   Ht 5\' 6"  (1.676 m)   Wt 54.2 kg   SpO2 90%   BMI 19.29 kg/m  SpO2: SpO2: 90 % O2 Device: O2 Device: Nasal Cannula O2 Flow Rate: O2 Flow Rate (L/min): 2 L/min   LBM: Last BM Date: 10/19/21 Baseline Weight:  Weight: 54.4 kg Most recent weight: Weight: 54.2 kg       Palliative Assessment/Data: PPS 60%     Palliative Care Assessment & Plan   Assessment: - aspiration pneumonia - AKI on CKD - chronic heart failure with reduced EF - atrial fibrillation - COPD - physical conditioning  Recommendations/Plan: Continue current care Goal of care is to return home when medically stable and optimized Outpatient palliative referral  Goals of Care and Additional Recommendations: Limitations on Scope of Treatment: Full Scope Treatment  Code Status: DNR/DNI  Prognosis:  Difficult to determine, but less than 6 months would not be surprising  Discharge Planning: Home with Home Health and outpatient palliative    Thank you for allowing the Palliative Medicine Team to assist in the care of this patient.   Total Time 20 minutes Prolonged Time Billed  no       Greater than 50%  of this time was spent counseling and coordinating care related to the above assessment and plan.  Lavena Bullion, NP  Please contact Palliative Medicine Team phone at [phone] for questions and concerns.

## 2021-10-20 NOTE — Progress Notes (Signed)
HD#3 Subjective:  Overnight Events: NAEO  Patient awake sitting up in chair.  States he is feeling well today notes he spilled his urinal this morning and was moved to the chair.  Otherwise states he rested well overnight and has no acute complaints this morning.  He denies any shortness of breath or chest pain.  Objective:  Vital signs in last 24 hours: Vitals:   10/19/21 1939 10/19/21 2352 10/20/21 0400 10/20/21 0500  BP: 114/63 128/77 (!) 112/57   Pulse: 70 70 70   Resp: 17 16 11    Temp: 98 F (36.7 C)  98.5 F (36.9 C)   TempSrc: Oral  Oral   SpO2: 98% 90% 98%   Weight:    54.2 kg  Height:       Supplemental O2: Nasal Cannula SpO2: 98 % O2 Flow Rate (L/min): 2 L/min FiO2 (%): 32 %   Physical Exam:  Constitutional: chronically ill appearing elderly man sitting up in chair in no acute distress Cardiovascular: regular rate and rhythm, no m/r/g, trace pitting edema bilaterally Pulmonary/Chest: normal work  speaking in full sentences, CTAB, O2 saturations around 88 to 90% on 2 L [place] Abdominal: soft, non-tender, non-distended Neurological: alert and answering questions appropriately Skin: warm and dry Psych: normal affect  Filed Weights   10/17/21 1833 10/20/21 0500  Weight: 54.4 kg 54.2 kg     Intake/Output Summary (Last 24 hours) at 10/20/2021 0658 Last data filed at 10/20/2021 0533 Gross per 24 hour  Intake 560 ml  Output --  Net 560 ml    Net IO Since Admission: 1,354.65 mL [10/20/21 0658]  Pertinent Labs: CBC Latest Ref Rng & Units 10/20/2021 10/19/2021 10/18/2021  WBC 4.0 - 10.5 K/uL 6.8 9.7 6.5  Hemoglobin 13.0 - 17.0 g/dL 10.7(L) 11.0(L) 11.6(L)  Hematocrit 39.0 - 52.0 % 33.7(L) 33.9(L) 35.4(L)  Platelets 150 - 400 K/uL 250 265 266    CMP Latest Ref Rng & Units 10/20/2021 10/19/2021 10/18/2021  Glucose 70 - 99 mg/dL 213(H) 190(H) 266(H)  BUN 8 - 23 mg/dL 28(H) 43(H) 63(H)  Creatinine 0.61 - 1.24 mg/dL 1.35(H) 1.39(H) 1.96(H)  Sodium 135 - 145  mmol/L 138 135 134(L)  Potassium 3.5 - 5.1 mmol/L 4.0 4.2 5.2(H)  Chloride 98 - 111 mmol/L 101 98 94(L)  CO2 22 - 32 mmol/L 32 30 27  Calcium 8.9 - 10.3 mg/dL 8.3(L) 8.2(L) 8.0(L)  Total Protein 6.5 - 8.1 g/dL - - -  Total Bilirubin 0.3 - 1.2 mg/dL - - -  Alkaline Phos 38 - 126 U/L - - -  AST 15 - 41 U/L - - -  ALT 0 - 44 U/L - - -    Imaging: No results found.  Assessment/Plan:   Principal Problem:   CAP (community acquired pneumonia) Active Problems:   Chronic combined systolic and diastolic CHF (congestive heart failure) (HCC)   Essential hypertension   Diabetes mellitus type 2 in nonobese (HCC)   Atrial fibrillation with RVR (HCC)   Moderate protein-calorie malnutrition (HCC)   Protein-calorie malnutrition, severe   Patient Summary: Kevin [name] is a 80 y.o. with a pertinent PMH of of atrial fibrillation, HFrEF s/p AICD, MI, stage IIB melanoma,  HTN, T2DM who was admitted for postviral bacterial pneumonia and an AKI.  Likely aspiration pneumonia  Patient saturating well on 2 L this morning.  Has been afebrile with no leukocytosis. SLP evaluated patient and recommended he continues with a regular diet and thin liquids. Unable to have  mBSS to rule out silent aspiration due to radiology scheduling. Will hopefully be able to do this today. - continue unasyn for 5 days of total antibiotics. On day 4/5. - OOB, IS, flutter valve, mobilize  - wean supplemental oxygen as able, may require home O2   AKI on CKD Cr continue to improve. Is close to baseline. AKI likely 2/2 to prerenal etiology in the setting of poor oral intake.  -Continue to encourage oral intake, meal supplements  - daily bmp - avoid nephrotoxic medications  Chronic heart failure with reduced ejection fraction  Atrial fibrillation  CHADS2VASC 4. Rate controlled. Euvolemic on exam, BNP down to 419 from 1250 last admission. Kidney function improving. Home medication held in setting of AKI. Appears euvolemic  today. -- Restart toresemide 20 mg daily.  -- Holding entresto, and digoxin BP continuing to run low - Continue lipitor - Continue amiodarone 200 mg BID will need to continue until 12/26 to complete 10g load - continue metoprolol succinate and eliquis - Continue GOC with palliative. I talked with daughter [name] on phone today. Was having concerns about home medications seems there was some confusion as to the correct medications given he was started on several new HF medications at last discharge, she will be visiting tomorrow afternoon, will discuss this further.    Type 2 Diabetes Mellitus -Hold metformin in the setting of AKI and recent weight loss -Jardiance and pioglitazone discontinued by cardiology out of concern for early DKA during his last ED visit --ssi with meals  COPD Suspect current respiratory difficulties are related to above rather than COPD exacerbation, will hold on additional steroids at this time  - breo ellipta and incruse ellipta daily - albuterol Q4 PRN  Physical deconditioning.  Suspect this is driven by acute infection and multiple comorbidities. - PT/OT: HH PT/OT   Anemia Hemoglobin mildly low at 11-12. 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

## 2021-10-21 ENCOUNTER — Ambulatory Visit: Payer: Medicare PPO | Admitting: Oncology

## 2021-10-21 ENCOUNTER — Inpatient Hospital Stay (HOSPITAL_COMMUNITY): Payer: Medicare PPO

## 2021-10-21 ENCOUNTER — Other Ambulatory Visit: Payer: Medicare PPO

## 2021-10-21 LAB — CBC
HCT: 34.1 % — ABNORMAL LOW (ref 39.0–52.0)
Hemoglobin: 10.9 g/dL — ABNORMAL LOW (ref 13.0–17.0)
MCH: 30.4 pg (ref 26.0–34.0)
MCHC: 32 g/dL (ref 30.0–36.0)
MCV: 95.3 fL (ref 80.0–100.0)
Platelets: 249 10*3/uL (ref 150–400)
RBC: 3.58 MIL/uL — ABNORMAL LOW (ref 4.22–5.81)
RDW: 13.7 % (ref 11.5–15.5)
WBC: 5.3 10*3/uL (ref 4.0–10.5)
nRBC: 0 % (ref 0.0–0.2)

## 2021-10-21 LAB — BASIC METABOLIC PANEL
Anion gap: 7 (ref 5–15)
BUN: 23 mg/dL (ref 8–23)
CO2: 35 mmol/L — ABNORMAL HIGH (ref 22–32)
Calcium: 8.2 mg/dL — ABNORMAL LOW (ref 8.9–10.3)
Chloride: 97 mmol/L — ABNORMAL LOW (ref 98–111)
Creatinine, Ser: 1.33 mg/dL — ABNORMAL HIGH (ref 0.61–1.24)
GFR, Estimated: 54 mL/min — ABNORMAL LOW (ref >60)
Glucose, Bld: 168 mg/dL — ABNORMAL HIGH (ref 70–99)
Potassium: 3.9 mmol/L (ref 3.5–5.1)
Sodium: 139 mmol/L (ref 135–145)

## 2021-10-21 LAB — GLUCOSE, CAPILLARY
Glucose-Capillary: 164 mg/dL — ABNORMAL HIGH (ref 70–99)
Glucose-Capillary: 207 mg/dL — ABNORMAL HIGH (ref 70–99)

## 2021-10-21 NOTE — TOC Transition Note (Incomplete Revision)
Transition of Care [facility]) - CM/SW Discharge Note   Patient Details  Name: Roberta Aloys Hupfer MRN: [id number] Date of Birth: [birth year]  Transition of Care [facility]) CM/SW Contact:  [name], RN Phone Number: 10/21/2021, 2:32 PM   Clinical Narrative:     Clinical Narrative:    Patient stable for discharge.  Orders for HH-PT/OT and walker  Notified Tommi Rumps with [facility] of discharge Patient agreeable to use hospital provider. Spoke to [place] with [employer] and ordered walker to be delivered to the room Corky Mull, daughter to notify her of discharge. She states her sister [name] is on the way to pick patient up. [name] is calling [name] to make sure she brought the home oxygen. Ba 1500 [name] called     Barriers to Discharge: Barriers Resolved   Patient Goals and CMS Choice      Return home  Discharge Placement                 home      Discharge Plan and Services   Discharge Planning Services: CM Consult Post Acute Care Choice: Home Health                               Social Determinants of Health (SDOH) Interventions     Readmission Risk Interventions Readmission Risk Prevention Plan 10/21/2021  Transportation Screening Complete  PCP or Specialist Appt within 3-5 Days Complete  HRI or [place] Complete  Social Work Consult for [place] Planning/Counseling Patient refused  Palliative Care Screening Complete  Medication Review Press photographer) Complete  Some recent data might be hidden

## 2021-10-21 NOTE — Progress Notes (Signed)
HD#4 Subjective:  Overnight Events: NAEO   Patient says that he is feeling pretty good today. Doesn't have any complaints. He says that he went for a swallow test today which went fine. He had a good breakfast.  Objective:  Vital signs in last 24 hours: Vitals:   10/20/21 1944 10/21/21 0000 10/21/21 0400 10/21/21 0500  BP: 117/77 (!) 100/49 (!) 89/49   Pulse: 70 70 70   Resp: 13 14 15    Temp: 98 F (36.7 C) 98.5 F (36.9 C) 98.5 F (36.9 C)   TempSrc: Oral Oral Oral   SpO2: 97% 97% 98%   Weight:    85.1 kg  Height:       Supplemental O2: Nasal Cannula SpO2: 98 % O2 Flow Rate (L/min): 2 L/min FiO2 (%): 28 %   Physical Exam:  Constitutional: chronically ill appearing elderly man laying in bed in NAD Cardiovascular: regular rate and rhythm, no m/r/g Pulmonary/Chest: normal work  speaking in full sentences, CTAB, O2 saturations around 95% on 3 L [place] Abdominal: soft, non-tender, non-distended Neurological: alert and answering questions appropriately Skin: warm and dry; slightly decreased skin turgor Psych: normal affect  Filed Weights   10/17/21 1833 10/20/21 0500 10/21/21 0500  Weight: 54.4 kg 54.2 kg 85.1 kg     Intake/Output Summary (Last 24 hours) at 10/21/2021 0657 Last data filed at 10/21/2021 0507 Gross per 24 hour  Intake 960 ml  Output 675 ml  Net 285 ml   Net IO Since Admission: 1,639.65 mL [10/21/21 0657]  Pertinent Labs: CBC Latest Ref Rng & Units 10/21/2021 10/20/2021 10/19/2021  WBC 4.0 - 10.5 K/uL 5.3 6.8 9.7  Hemoglobin 13.0 - 17.0 g/dL 10.9(L) 10.7(L) 11.0(L)  Hematocrit 39.0 - 52.0 % 34.1(L) 33.7(L) 33.9(L)  Platelets 150 - 400 K/uL 249 250 265    CMP Latest Ref Rng & Units 10/21/2021 10/20/2021 10/19/2021  Glucose 70 - 99 mg/dL 168(H) 213(H) 190(H)  BUN 8 - 23 mg/dL 23 28(H) 43(H)  Creatinine 0.61 - 1.24 mg/dL 1.33(H) 1.35(H) 1.39(H)  Sodium 135 - 145 mmol/L 139 138 135  Potassium 3.5 - 5.1 mmol/L 3.9 4.0 4.2  Chloride 98 - 111  mmol/L 97(L) 101 98  CO2 22 - 32 mmol/L 35(H) 32 30  Calcium 8.9 - 10.3 mg/dL 8.2(L) 8.3(L) 8.2(L)  Total Protein 6.5 - 8.1 g/dL - - -  Total Bilirubin 0.3 - 1.2 mg/dL - - -  Alkaline Phos 38 - 126 U/L - - -  AST 15 - 41 U/L - - -  ALT 0 - 44 U/L - - -    Imaging: No results found.  Assessment/Plan:   Principal Problem:   CAP (community acquired pneumonia) Active Problems:   Chronic combined systolic and diastolic CHF (congestive heart failure) (HCC)   Essential hypertension   Diabetes mellitus type 2 in nonobese (HCC)   Atrial fibrillation with RVR (HCC)   Moderate protein-calorie malnutrition (HCC)   Protein-calorie malnutrition, severe   Patient Summary: Kevin [name] is a 80 y.o. with a pertinent PMH of of atrial fibrillation, HFrEF s/p AICD, MI, stage IIB melanoma,  HTN, T2DM who was admitted for postviral bacterial pneumonia and an AKI.   Likely aspiration pneumonia  Patient saturating well on 3 L this morning.  Has been afebrile with no leukocytosis. MBSS was normal. SLP evaluated patient and recommended he continues with a regular diet and thin liquids. Doing well from a breathing standpoint and is not short of breath. - continue  unasyn for 5 days of total antibiotics. Last day 12/12 - OOB, IS, flutter valve, mobilize  - wean supplemental oxygen as able, may require home O2    AKI on CKD Cr likely at baseline ~1.3. Patient looked a little dry on exam, will encourage good PO intake and hold off on IV fluids at this time given HF. - Continue to encourage oral intake, meal supplements  - daily bmp - avoid nephrotoxic medications  Chronic heart failure with reduced ejection fraction  Atrial fibrillation  CHADS2VASC 4. Rate controlled. Euvolemic on exam, BNP down to 419 from 1250 last admission. Kidney function improving. Home medication held in setting of AKI. Appears euvolemic today. -- Restart toresemide 20 mg daily.  -- Holding entresto, and digoxin BP  continuing to run low - Continue lipitor - Continue amiodarone 200 mg BID will need to continue until 12/26 to complete 10g load - continue metoprolol succinate and eliquis - Continue GOC with palliative and discussions on medication management   Type 2 Diabetes Mellitus -Hold metformin in the setting of AKI and recent weight loss -Jardiance and pioglitazone discontinued by cardiology out of concern for early DKA during his last ED visit --ssi with meals   COPD Suspect current respiratory difficulties are related to above rather than COPD exacerbation, will hold on additional steroids at this time  - breo ellipta and incruse ellipta daily - albuterol Q4 PRN   Physical deconditioning.  Suspect this is driven by acute infection and multiple comorbidities. - PT/OT: HH PT/OT   Anemia Hemoglobin mildly low at 11-12. - daily CBC - transfuse <7   Stage II Melanoma. 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

## 2021-10-21 NOTE — Progress Notes (Signed)
SATURATION QUALIFICATIONS: (This note is used to comply with regulatory documentation for home oxygen)  Patient Saturations on Room Air at Rest = 92%  Patient Saturations on Room Air while Ambulating = 88%  Patient Saturations on 94 Liters of oxygen while Ambulating = 3%  Please briefly explain why patient needs home oxygen:

## 2021-10-21 NOTE — Progress Notes (Signed)
[facility] ([facility])  Hospital Liaison: RN note         This patient has been referred to our palliative care services in the community.  [facility] will continue to follow for any discharge planning needs and to coordinate continuation of palliative care in the outpatient setting.    If you have questions or need assistance, please call [phone] or contact the hospital Liaison listed on AMION.      Thank you for this referral.         Mary Anne Robertson, RN, CCM  [facility] Hospital Liaison   [phone] 

## 2021-10-21 NOTE — Progress Notes (Signed)
Modified Barium Swallow Progress Note  Patient Details  Name: Kevin [name] MRN: [id number] Date of Birth: 05/19/1941  Today's Date: 10/21/2021  Modified Barium Swallow completed.  Full report located under Chart Review in the Imaging Section.  Brief recommendations include the following:  Clinical Impression  Pt was seen in radiology suite for modified barium swallow study. Trials of puree solids, regular texture solids, a 25mm barium tablet, nectar thick liquids, and thin liquids via cup and straw were administered. Pt's oropharyngeal swallow mechanism was within functional limits. No instances of penetration/aspiration were demonstrated despite challenges of large boluses and consecutive swallows. It is recommended that the pt's current diet of regular texture solids and thin liquids be continued. Further skilled SLP services are not clinically indicated at this time.   Swallow Evaluation Recommendations       SLP Diet Recommendations: Regular solids;Thin liquid   Liquid Administration via: Cup;Straw   Medication Administration: Whole meds with liquid   Supervision: Patient able to self feed   Compensations: Minimize environmental distractions   Postural Changes: Seated upright at 90 degrees   Oral Care Recommendations: Oral care BID      Kevin [name], [place], [employer] Office number [phone] Pager [phone]   Horton Marshall 10/21/2021,10:23 AM

## 2021-10-21 NOTE — Progress Notes (Signed)
Physical Therapy Treatment Patient Details Name: Kevin [name] MRN: [id number] DOB: 1941/01/10 Today's Date: 10/21/2021   History of Present Illness 80 y.o. male who presented with declining functional status and increasing shortness of breath. Pt also with one fall at home prior to admission. Chest Xray shows Pnuemonia.  PMH:  atrial fibrillation s/p PPM, MI, stage IIB melanoma, congestive HF, HTN, T2DM    no prec    PT Comments    Pt was seen for mobility to monitor O2 sats without O2:  SATURATION QUALIFICATIONS: (This note is used to comply with regulatory documentation for home oxygen)  Patient Saturations on Room Air at Rest = 98%  Patient Saturations on Room Air while Ambulating = 88%  Patient Saturations on 2 Liters of oxygen while Ambulating = 94%  Please briefly explain why patient needs home oxygen: Cannot walk hallway distances without O2  Recommendations for follow up therapy are one component of a multi-disciplinary discharge planning process, led by the attending physician.  Recommendations may be updated based on patient status, additional functional criteria and insurance authorization.  Follow Up Recommendations  Home health PT     Assistance Recommended at Discharge Frequent or constant Supervision/Assistance  Equipment Recommendations       Recommendations for Other Services       Precautions / Restrictions Precautions Precautions: Fall Precaution Comments: monitor vitals; does not wear O2 at baseline Restrictions Weight Bearing Restrictions: No     Mobility  Bed Mobility Overal bed mobility: Modified Independent                  Transfers Overall transfer level: Needs assistance Equipment used: Rolling walker (2 wheels) Transfers: Sit to/from Stand Sit to Stand: Min guard           General transfer comment: transfers with min guard due to safety observations    Ambulation/Gait Ambulation/Gait assistance: Min guard Gait  Distance (Feet): 140 Feet Assistive device: Rolling walker (2 wheels) Gait Pattern/deviations: Step-through pattern;Decreased stride length;Narrow base of support Gait velocity: decreased Gait velocity interpretation: <1.31 ft/sec, indicative of household ambulator Pre-gait activities: check of standing vitals General Gait Details: Supervision with RW and monitored sats with drops in sats to go on hallway, fatigued to get down to 86%   Stairs             Wheelchair Mobility    Modified Rankin (Stroke Patients Only)       Balance Overall balance assessment: Needs assistance Sitting-balance support: Feet supported Sitting balance-Leahy Scale: Good     Standing balance support: Reliant on assistive device for balance Standing balance-Leahy Scale: Poor                              Cognition Arousal/Alertness: Awake/alert Behavior During Therapy: WFL for tasks assessed/performed Overall Cognitive Status: Within Functional Limits for tasks assessed Area of Impairment: Attention;Memory;Following commands;Safety/judgement;Awareness;Problem solving                   Current Attention Level: Selective Memory: Decreased short-term memory Following Commands: Follows one step commands with increased time;Follows multi-step commands with increased time Safety/Judgement: Decreased awareness of deficits;Decreased awareness of safety Awareness: Emergent Problem Solving: Slow processing;Difficulty sequencing;Requires verbal cues;Requires tactile cues General Comments: able to follow along with cues and information        Exercises General Exercises - Lower Extremity Ankle Circles/Pumps: AROM;Both;10 reps;Seated Quad Sets: AROM;10 reps Gluteal Sets: AROM;10 reps Heel Slides:  AROM;10 reps Hip ABduction/ADduction: AROM;10 reps    General Comments General comments (skin integrity, edema, etc.): pt is reasonably safe on RW but mainly is having issues of sats  dropping.  Monitoring of O2 was helpful, requiring O2 but did not demonstrate a maintenence on O2 due to fatigue from the drop in the first place      Pertinent Vitals/Pain Pain Assessment: No/denies pain    Home Living                          Prior Function            PT Goals (current goals can now be found in the care plan section) Progress towards PT goals: Progressing toward goals    Frequency    Min 3X/week      PT Plan Current plan remains appropriate    Co-evaluation              AM-PAC PT "6 Clicks" Mobility   Outcome Measure  Help needed turning from your back to your side while in a flat bed without using bedrails?: None Help needed moving from lying on your back to sitting on the side of a flat bed without using bedrails?: None Help needed moving to and from a bed to a chair (including a wheelchair)?: A Little Help needed standing up from a chair using your arms (e.g., wheelchair or bedside chair)?: A Little Help needed to walk in hospital room?: A Little Help needed climbing 3-5 steps with a railing? : A Little 6 Click Score: 20    End of Session Equipment Utilized During Treatment: Gait belt;Oxygen Activity Tolerance: Patient tolerated treatment well Patient left: in bed;with call bell/phone within reach;with bed alarm set;with family/visitor present Nurse Communication: Mobility status PT Visit Diagnosis: Muscle weakness (generalized) (M62.81);Difficulty in walking, not elsewhere classified (R26.2);Unsteadiness on feet (R26.81);Other abnormalities of gait and mobility (R26.89)     Time: 0258-5277 PT Time Calculation (min) (ACUTE ONLY): 29 min  Charges:  $Gait Training: 8-22 mins $Therapeutic Exercise: 8-22 mins        Ramond Dial 10/21/2021, 6:58 PM  Mee Hives, PT PhD Acute Rehab Dept. Number: [place] and [place]

## 2021-10-21 NOTE — Care Management Important Message (Signed)
Important Message  Patient Details  Name: Kevin [name] MRN: [id number] Date of Birth: [birth year]   Medicare Important Message Given:  Yes     Briscoe Daniello Montine Circle 10/21/2021, 3:48 PM

## 2021-10-21 NOTE — TOC Transition Note (Signed)
Transition of Care [facility]) - CM/SW Discharge Note   Patient Details  Name: Kevin [name] MRN: [id number] Date of Birth: Sep 16, 1941  [facility]) CM/SW Contact:  [name], RN Phone Number: 10/21/2021, 2:32 PM   Clinical Narrative:     Clinical Narrative:    Patient stable for discharge.  Orders for HH-PT/OT and walker  Notified Tommi Rumps with [facility] of discharge Patient agreeable to use hospital provider. Spoke to [place] with [employer] and ordered walker to be delivered to the room Corky Mull, daughter to notify her of discharge. She states her sister [name] is on the way to pick patient up. [name] is calling [name] to make sure she brought the home oxygen.     Barriers to Discharge: Barriers Resolved   Patient Goals and CMS Choice      Return home  Discharge Placement                 home      Discharge Plan and Services   Discharge Planning Services: CM Consult Post Acute Care Choice: Home Health                               Social Determinants of Health (SDOH) Interventions     Readmission Risk Interventions Readmission Risk Prevention Plan 10/21/2021  Transportation Screening Complete  PCP or Specialist Appt within 3-5 Days Complete  HRI or [place] Complete  Social Work Consult for [place] Planning/Counseling Patient refused  Palliative Care Screening Complete  Medication Review Press photographer) Complete  Some recent data might be hidden

## 2021-10-21 NOTE — Discharge Summary (Addendum)
Name: Kevin [name] MRN: [id number] DOB: 1941/01/21 80 y.o. PCP: [name], PA-C  Date of Admission: 10/17/2021 10:20 AM Date of Discharge:   10/21/21 Attending Physician: Axel Filler, *  Discharge Diagnosis: 1. Aspiration pneumonia  2. AKI on CKD 3. Chronic heart failure with reduced ejection fraction  4. Atrial fibrillation  5. Type 2 Diabetes Mellitus 6. COPD  7. Physical deconditioning 8. Anemia 9. Stage II Melanoma  Discharge Medications: Allergies as of 10/21/2021   No Known Allergies      Medication List     STOP taking these medications    digoxin 0.125 MG tablet Commonly known as: LANOXIN   torsemide 20 MG tablet Commonly known as: DEMADEX       TAKE these medications    albuterol (2.5 MG/3ML) 0.083% nebulizer solution Commonly known as: PROVENTIL Take 2.5 mg by nebulization every 4 (four) hours as needed for shortness of breath or wheezing.   amiodarone 200 MG tablet Commonly known as: PACERONE Take 1 tablet (200 mg total) by mouth 2 (two) times daily.   atorvastatin 80 MG tablet Commonly known as: LIPITOR Take 80 mg by mouth at bedtime.   budesonide-formoterol 80-4.5 MCG/ACT inhaler Commonly known as: SYMBICORT Inhale 2 puffs into the lungs 2 (two) times daily.   clotrimazole-betamethasone cream Commonly known as: Lotrisone Apply 1 application topically 2 (two) times daily.   Eliquis 5 MG Tabs tablet Generic drug: apixaban Take 1 tablet (5 mg total) by mouth 2 (two) times daily.   Entresto 24-26 MG Generic drug: sacubitril-valsartan Take 1 tablet by mouth 2 (two) times daily.   Ferrous Fumarate 324 (106 Fe) MG Tabs tablet Commonly known as: HEMOCYTE - 106 mg FE Take 1 tablet by mouth.   magnesium oxide 400 (241.3 Mg) MG tablet Commonly known as: MAG-OX Take 400 mg by mouth 2 (two) times daily.   metFORMIN 1000 MG tablet Commonly known as: GLUCOPHAGE Take 1,000 mg by mouth 2 (two) times daily.    metoprolol succinate 25 MG 24 hr tablet Commonly known as: TOPROL-XL Take 1/2 tablet (12.5 mg total) by mouth daily.               Durable Medical Equipment  (From admission, onward)           Start     Ordered   10/21/21 1341  DME Walker  Once       Question Answer Comment  Walker: With 5 Inch Wheels   Patient needs a walker to treat with the following condition Pneumonia      10/21/21 1342   10/21/21 1340  DME Oxygen  Once       Question Answer Comment  Length of Need 6 Months   Mode or (Route) Nasal cannula   Liters per Minute 3   Oxygen delivery system Gas      10/21/21 1342   10/21/21 1336  For home use only DME oxygen  Once       Question Answer Comment  Length of Need 6 Months   Mode or (Route) Nasal cannula   Liters per Minute 3   Frequency Continuous (stationary and portable oxygen unit needed)   Oxygen delivery system Gas      10/21/21 1335            Disposition and follow-up:   [name] was discharged from [facility] in Stable condition.  At the hospital follow up visit please address:  1.  Aspiration  pneumonia/COPD- reassess need for home oxygen   2. AKI on CKD- recheck creatinine  3. Recheck sugars with recent adjustments in medications  4. HFrEF- assess volume status  5. Physical deconditioning- continue discussions on goals of care/ability to function at home with family safely  6.  Labs / imaging needed at time of follow-up: bmp  7.  Pending labs/ test needing follow-up: none  Follow-up Appointments:   Hospital Course by problem list: 1.  Aspiration pneumonia; COPD Prior to hospitalization patient had recent influenza infection on top of underlying COPD putting him at risk for a bacterial pneumonia. He came into the hospital with a declining functional status, decreased PO intake, and was found to have patchy right basilar airspace opacities suggestive of pneumonia. He was hypoxic on ABG to 69  but without leukocytosis or fever. He was started azithromycin and rocephin which was transitioned to [facility] for a 5 day course. Based on right sided infiltrate and poor functional status, this seemed consistent with aspiration event. Patient did report coughing with swallow sometimes. He worked with speech therapy, did well with a swallow evaluation, so on regular consistency diet for comfort. He continued his breo, incruse, and as needed albuterol nebulizer. He remained on the 3L and was deemed stable for discharge. He will have outpatient palliative care involvement moving forward to help with symptom management.  2. AKI on CKD Patient had an increase in his creatinine to about 2.2 in the setting of poor PO intake and acute illness. This improved with fluids and increase in PO intake to 1.33 which is probably about patient's baseline.This will be followed up in the outpatient setting.  3. Chronic heart failure with reduced ejection fraction; Atrial fibrillation  Patient was rate controlled and euvolemic on exam, BNP was 419 from 1250 during prior admission. Home entresto and digoxin were held due to lower pressures. Torsemide 20 daily, amiodarone 200 BID, metoprolol succinate 25 daily, and eliquis 5 BID were continued. He will continue his prior medications with the following changes. Stop torsemide, stop digoxin. So he will go home with amiodarone 200 BID, metoprolol 12.5 daily, and eliquis 5 BID.  5. Type 2 Diabetes Mellitus Patient's metformin was held on admission 2/2 AKI. Patient also had recent weight loss. Jardiance and pioglitazone were recently d/c by cardiology. He was kept on SSI with meals. He will restart metformin at discharge.  6. Hyponatremia Likely hypovolemic hyponatremia, improved with fluids and PO intake  7. Physical deconditioning Patient came in weak and slightly malnourished likely as a result of multiple recent illnesses as well as multiorgan dysfunction. Palliative care  was consulted during admission though no changes to current maximized medical therapy.  8. Anemia Stable around 11-12, required no transfusions  9. Stage II Melanoma Chronic and stable on nivolumab  Discharge Exam:   BP (!) 108/49 (BP Location: Left Arm)   Pulse 70   Temp 98.6 F (37 C) (Axillary)   Resp 15   Ht 5\' 6"  (1.676 m)   Wt 85.1 kg   SpO2 92%   BMI 30.28 kg/m  Discharge exam:  Constitutional: chronically ill appearing elderly man laying in bed in NAD Cardiovascular: regular rate and rhythm, no m/r/g Pulmonary/Chest: normal work  speaking in full sentences, CTAB, O2 saturations around 95% on 3 L [place] Abdominal: soft, non-tender, non-distended Neurological: alert and answering questions appropriately Skin: warm and dry; slightly decreased skin turgor Psych: normal affect    Pertinent Labs, Studies, and Procedures:   DG Chest 1  View  Result Date: 10/17/2021 CLINICAL DATA:  Shortness of breath. EXAM: CHEST  1 VIEW COMPARISON:  10/13/2021 FINDINGS: A right jugular Port-A-Cath and left subclavian approach ICD are unchanged. The cardiac silhouette remains mildly enlarged, accentuated by AP technique and lower lung volumes on the current study. New patchy airspace opacity is present laterally in the right lung base. No sizable pleural effusion or pneumothorax is identified. Postsurgical changes are again noted at the right shoulder with chronically fractured cerclage wire. IMPRESSION: New patchy right basilar airspace opacity concerning for pneumonia. Electronically Signed   By: Logan Bores M.D.   On: 10/17/2021 11:04   US RENAL  Result Date: 10/17/2021 CLINICAL DATA:  Acute kidney insufficiency EXAM: RENAL / URINARY TRACT ULTRASOUND COMPLETE COMPARISON:  None. FINDINGS: Right Kidney: Renal measurements: 10.3 x 5.8 x 6.4 cm = volume: 200 mL. Mildly increased echogenicity. No hydronephrosis, nephrolithiasis or suspicious mass identified. Left Kidney: Renal measurements: 10.7 x 5.8  x 5.3 cm = volume: 169 mL. Mildly increased echogenicity. No hydronephrosis or nephrolithiasis. Anechoic cysts in the upper pole measuring up to 6 x 5.5 cm and 2.5 x 2.9 cm. Bladder: Nondistended with no obvious mass visualized. Other: Prostate gland is enlarged measuring 5.2 x 4.4 x 4.3 cm. IMPRESSION: 1. Mildly increased echogenicity of the kidneys suggesting medical renal disease. No hydronephrosis. 2. Left renal cysts. 3. Prostatomegaly. Electronically Signed   By: Ofilia Neas M.D.   On: 10/17/2021 17:38    CBC Latest Ref Rng & Units 10/21/2021 10/20/2021 10/19/2021  WBC 4.0 - 10.5 K/uL 5.3 6.8 9.7  Hemoglobin 13.0 - 17.0 g/dL 10.9(L) 10.7(L) 11.0(L)  Hematocrit 39.0 - 52.0 % 34.1(L) 33.7(L) 33.9(L)  Platelets 150 - 400 K/uL 249 250 265    BMP Latest Ref Rng & Units 10/21/2021 10/20/2021 10/19/2021  Glucose 70 - 99 mg/dL 168(H) 213(H) 190(H)  BUN 8 - 23 mg/dL 23 28(H) 43(H)  Creatinine 0.61 - 1.24 mg/dL 1.33(H) 1.35(H) 1.39(H)  Sodium 135 - 145 mmol/L 139 138 135  Potassium 3.5 - 5.1 mmol/L 3.9 4.0 4.2  Chloride 98 - 111 mmol/L 97(L) 101 98  CO2 22 - 32 mmol/L 35(H) 32 30  Calcium 8.9 - 10.3 mg/dL 8.2(L) 8.3(L) 8.2(L)    Discharge Instructions: Discharge Instructions     Call MD for:  difficulty breathing, headache or visual disturbances   Complete by: As directed    Call MD for:  persistant dizziness or light-headedness   Complete by: As directed    Call MD for:  temperature >100.4   Complete by: As directed    Diet - low sodium heart healthy   Complete by: As directed    Increase activity slowly   Complete by: As directed        Signed: Scarlett Presto, MD 10/21/2021, 1:45 PM   Pager: [phone]

## 2021-10-21 NOTE — TOC Transition Note (Signed)
Transition of Care [facility]) - CM/SW Discharge Note   Patient Details  Name: Kevin [name] MRN: [id number] Date of Birth: 1941/04/24  [facility] CM/SW Contact:  [name], RN Phone Number: 10/21/2021, 12:42 PM   Clinical Narrative:    Patient was discharged with HH-PT/OT with [facility]. He also went home with home 02 2l with [employer].Attempted to speak to patient but no answer.  I spoke to Stewart, patient's daughter, and she approves the outpatient palliative services.  Referral sent to [facility].  [name] states her and her sister take her father to appointments    Barriers to Discharge: Continued Medical Work up   Patient Goals and CMS Choice      Return home  Discharge Placement                 Home with daughter      Discharge Plan and Services   Discharge Planning Services: CM Consult Post Acute Care Choice: Home Health                               Social Determinants of Health (SDOH) Interventions     Readmission Risk Interventions No flowsheet data found.

## 2021-10-21 NOTE — Progress Notes (Signed)
Occupational Therapy Treatment Patient Details Name: Kevin [name] MRN: [id number] DOB: 04-Oct-1941 Today's Date: 10/21/2021   History of present illness 80 y.o. male who presented with declining functional status and increasing shortness of breath. Pt also with one fall at home prior to admission. Chest Xray shows Pnuemonia.  PMH:  atrial fibrillation s/p PPM, MI, stage IIB melanoma, congestive HF, HTN, T2DM    no prec   OT comments  Patient seen by skilled OT to address standing tolerance and addressing multi step commands. Patient was able to tolerate 4-5 minutes of standing for 3 stands.  Transfers performed to address multi step commands with patient able to follow directions with occasional verbal cues for hand placement. Patient is making good gains with OT treatment. Acute OT to continue to follow.    Recommendations for follow up therapy are one component of a multi-disciplinary discharge planning process, led by the attending physician.  Recommendations may be updated based on patient status, additional functional criteria and insurance authorization.    Follow Up Recommendations  Home health OT    Assistance Recommended at Discharge Frequent or constant Supervision/Assistance  Equipment Recommendations  None recommended by OT    Recommendations for Other Services      Precautions / Restrictions Precautions Precautions: Fall Precaution Comments: monitor vitals; does not wear O2 at baseline Restrictions Weight Bearing Restrictions: No       Mobility Bed Mobility Overal bed mobility: Modified Independent Bed Mobility: Supine to Sit;Sit to Supine     Supine to sit: Modified independent (Device/Increase time) Sit to supine: Modified independent (Device/Increase time)   General bed mobility comments: required extra time following supine to sitting on EOB before addressing standing tolerance    Transfers Overall transfer level: Needs assistance Equipment used:  Rolling walker (2 wheels) Transfers: Sit to/from Stand Sit to Stand: Min guard           General transfer comment: tranfers performed from EOB to chair and back with verbal cues for safety and hand placement     Balance Overall balance assessment: Needs assistance Sitting-balance support: No upper extremity supported;Feet supported Sitting balance-Leahy Scale: Good     Standing balance support: Reliant on assistive device for balance;Bilateral upper extremity supported;During functional activity Standing balance-Leahy Scale: Poor Standing balance comment: stood x3 with patient tolerating 4-5 minutes of standing                           ADL either performed or assessed with clinical judgement   ADL                                              Extremity/Trunk Assessment              Vision       Perception     Praxis      Cognition Arousal/Alertness: Awake/alert Behavior During Therapy: WFL for tasks assessed/performed Overall Cognitive Status: Within Functional Limits for tasks assessed Area of Impairment: Attention;Memory;Following commands;Safety/judgement;Awareness;Problem solving                   Current Attention Level: Selective Memory: Decreased short-term memory Following Commands: Follows one step commands with increased time;Follows multi-step commands with increased time Safety/Judgement: Decreased awareness of deficits;Decreased awareness of safety Awareness: Emergent Problem Solving: Difficulty sequencing;Requires verbal cues General Comments:  talkative and appears cognitive intack Functional Status Assessment: Patient has not had a recent decline in their functional status        Exercises     Shoulder Instructions       General Comments      Pertinent Vitals/ Pain       Pain Assessment: No/denies pain  Home Living                                          Prior  Functioning/Environment              Frequency  Min 2X/week        Progress Toward Goals  OT Goals(current goals can now be found in the care plan section)  Progress towards OT goals: Progressing toward goals  Acute Rehab OT Goals Patient Stated Goal: go home OT Goal Formulation: With patient Time For Goal Achievement: 11/02/21 Potential to Achieve Goals: Good ADL Goals Pt Will Perform Lower Body Bathing: with modified independence;sit to/from stand Pt Will Transfer to Toilet: with modified independence;ambulating Pt/caregiver will Perform Home Exercise Program: Increased strength;Both right and left upper extremity;With theraband;Independently;With written HEP provided Additional ADL Goal #1: Pt will increased standing activity tolerance to 5-7 mins during ADL's/functional mobility to improve overall endurance Additional ADL Goal #2: Pt will verbalize 3 energy conservation techniques that he can use at home. Additional ADL Goal #3: Pt will follow multistep commands 75% of the session with no verbal cuing.  Plan Discharge plan remains appropriate    Co-evaluation                 AM-PAC OT "6 Clicks" Daily Activity     Outcome Measure   Help from another person eating meals?: A Little Help from another person taking care of personal grooming?: A Little Help from another person toileting, which includes using toliet, bedpan, or urinal?: A Little Help from another person bathing (including washing, rinsing, drying)?: A Little Help from another person to put on and taking off regular upper body clothing?: A Little Help from another person to put on and taking off regular lower body clothing?: A Little 6 Click Score: 18    End of Session Equipment Utilized During Treatment: Rolling walker (2 wheels);Oxygen  OT Visit Diagnosis: Unsteadiness on feet (R26.81);Other abnormalities of gait and mobility (R26.89);Muscle weakness (generalized) (M62.81)   Activity Tolerance  Patient tolerated treatment well   Patient Left in bed;with call bell/phone within reach;with bed alarm set;with family/visitor present   Nurse Communication Mobility status        Time: 8502-7741 OT Time Calculation (min): 29 min  Charges: OT General Charges $OT Visit: 1 Visit OT Treatments $Therapeutic Activity: 23-37 mins  Lodema Hong, [place]  Pager [phone] Office [place] 10/21/2021, 10:02 AM

## 2021-10-21 NOTE — Discharge Instructions (Addendum)
Kevin [name]  You were recently admitted to [facility] for Pneumonia. We gave you antibiotics and IV fluids as you were dehydrated when you first cam to the hospital. We also made some adjustments to your medications   Continue taking your home medications with the following changes  Stop taking digoxin and stop taking torsemide Continue using 3L of oxygen at home until you follow up with your primary care doctor   You should seek further medical care if you have increasing cough, fevers, shortness of breath, or dizziness/lightheadedness.  We recommend that you see your primary care doctor in about a week to make sure that you continue to improve. We are so glad that you are feeling better.  Sincerely, Scarlett Presto, MD

## 2021-10-22 LAB — CULTURE, BLOOD (ROUTINE X 2)
Culture: NO GROWTH
Culture: NO GROWTH
Special Requests: ADEQUATE

## 2021-10-22 NOTE — Care Management Important Message (Signed)
Important Message  Patient Details  Name: Kevin [name] MRN: [id number] Date of Birth: [birth year]   Medicare Important Message Given:  Yes Patient left prior to IM delivery will mail to the patient home address.   Kevin [name] 10/22/2021, 2:02 PM

## 2021-10-23 ENCOUNTER — Ambulatory Visit: Payer: Medicare PPO

## 2021-10-25 ENCOUNTER — Other Ambulatory Visit (HOSPITAL_COMMUNITY): Payer: Self-pay

## 2021-10-25 ENCOUNTER — Encounter: Payer: Self-pay | Admitting: Student

## 2021-10-25 ENCOUNTER — Other Ambulatory Visit: Payer: Self-pay

## 2021-10-25 ENCOUNTER — Ambulatory Visit: Payer: Medicare PPO | Admitting: Student

## 2021-10-25 ENCOUNTER — Telehealth (HOSPITAL_COMMUNITY): Payer: Self-pay

## 2021-10-25 VITALS — BP 113/52 | HR 70 | Temp 97.6°F | Resp 16 | Ht 66.0 in | Wt 147.0 lb

## 2021-10-25 DIAGNOSIS — Z720 Tobacco use: Secondary | ICD-10-CM

## 2021-10-25 DIAGNOSIS — I5022 Chronic systolic (congestive) heart failure: Secondary | ICD-10-CM

## 2021-10-25 DIAGNOSIS — I4819 Other persistent atrial fibrillation: Secondary | ICD-10-CM

## 2021-10-25 DIAGNOSIS — Z95810 Presence of automatic (implantable) cardiac defibrillator: Secondary | ICD-10-CM | POA: Diagnosis not present

## 2021-10-25 DIAGNOSIS — I10 Essential (primary) hypertension: Secondary | ICD-10-CM

## 2021-10-25 DIAGNOSIS — N1831 Chronic kidney disease, stage 3a: Secondary | ICD-10-CM

## 2021-10-25 DIAGNOSIS — I2510 Atherosclerotic heart disease of native coronary artery without angina pectoris: Secondary | ICD-10-CM

## 2021-10-25 MED ORDER — APIXABAN 5 MG PO TABS: 5.0000 mg | ORAL_TABLET | Freq: Two times a day (BID) | ORAL | 3 refills | Status: DC

## 2021-10-25 MED ORDER — AMIODARONE HCL 200 MG PO TABS: 200.0000 mg | ORAL_TABLET | Freq: Every day | ORAL | 3 refills | Status: DC

## 2021-10-25 MED ORDER — ATORVASTATIN CALCIUM 80 MG PO TABS: 80.0000 mg | ORAL_TABLET | Freq: Every day | ORAL | 3 refills | Status: DC

## 2021-10-25 MED ORDER — SACUBITRIL-VALSARTAN 24-26 MG PO TABS: 0.5000 | ORAL_TABLET | Freq: Two times a day (BID) | ORAL | 3 refills | Status: AC

## 2021-10-25 MED ORDER — FUROSEMIDE 20 MG PO TABS: 20.0000 mg | ORAL_TABLET | Freq: Every day | ORAL | 3 refills | Status: DC | PRN

## 2021-10-25 NOTE — Telephone Encounter (Signed)
Pharmacy Transitions of Care Follow-up Telephone Call  Date of discharge: 10/21/21  Discharge Diagnosis: CAP  How have you been since you were released from the hospital?  Spoke with daughter on the phone. Patient has follow up with cardiology today. No questions about meds at this time.  Medication changes made at discharge: START: Eliquis STOP taking: digoxin 0.125 MG tablet (LANOXIN)  torsemide 20 MG tablet (DEMADEX)   Medication changes verified by the patient? Yes    Medication Accessibility:  Home Pharmacy:  Not discussed  Was the patient provided with refills on discharged medications?  No  Have all prescriptions been transferred from [facility] to home pharmacy? N/A   Is the patient able to afford medications? No insurance    Medication Review:  APIXABAN (ELIQUIS)  Apixaban 5 mg BID initiated on 10/11/21.  - Discussed importance of taking medication around the same time everyday  - Advised patient of medications to avoid (NSAIDs, ASA)  - Educated that Tylenol (acetaminophen) will be the preferred analgesic to prevent risk of bleeding  - Emphasized importance of monitoring for signs and symptoms of bleeding (abnormal bruising, prolonged bleeding, nose bleeds, bleeding from gums, discolored urine, black tarry stools)  - Advised patient to alert all providers of anticoagulation therapy prior to starting a new medication or having a procedure      Follow-up Appointments:  [facility] f/u appt confirmed?  Scheduled to see Dr. Rayetta Pigg on 10/25/21 @ 1:00pm.   If their condition worsens, is the pt aware to call PCP or go to the Emergency Dept.? yes  Final Patient Assessment: Patient has follow up scheduled and knows to get refill at f/u

## 2021-10-25 NOTE — Progress Notes (Signed)
Primary Physician/Referring:  [name], PA-C  Patient ID: Kevin [name], male    DOB: 06-11-1941, 80 y.o.   MRN: [id number]  Chief Complaint  Patient presents with   Hospitalization Follow-up   Shortness of Breath   Congestive Heart Failure   HPI:    Rossie Colter Magowan  is a 80 y.o. Caucasian male  with hypertension, type 2 DM, COPD, nicotine dependence, CAD s/p ?MI/PCI (2015), ischemic cardiomyopathy with previously known LVEF 25%, s/p dual chamber ICD ([employer]), h/o atrial tachycardia, melanoma-currently on monthly chemotherapy.   Patient presented 10/06/2021 with shortness of breath and influenza A positive.  Cardiology was consulted to manage acute on chronic HFrEF and acute hypoxic respiratory failure.  Previously last known EF was 25%, during recent hospitalization now <20% with moderately dilated LV.  Patient was not on any guideline directed medical therapy, therefore this was uptitrated and patient was diuresed.  During hospitalization patient found to have new onset atrial fibrillation for which she was started on Eliquis and amiodarone.  Patient discharged on Jardiance, metoprolol, Entresto, torsemide, digoxin, as well as atorvastatin.  He was discharged 10/11/2021.  Patient was subsequently readmitted 10/17/2021 - 10/21/2021 with aspiration pneumonia and COPD, AKI, therefore digoxin, Jardiance, torsemide were discontinued.  Patient was discharged on Entresto and metoprolol, as well as amiodarone and Eliquis for A. fib and atorvastatin for hyperlipidemia.  Patient now presents to our office for outpatient follow-up and management of ischemic cardiomyopathy (LVEF <20% 10/07/2021) and atrial fibrillation.  Patient is presently on 3 L/min supplemental oxygen via nasal cannula.  He is feeling improved since discharge from the hospital, however his primary concern is fatigue.  He is planning to see oncology in early January consider restarting chemotherapy at that time.  Patient  is accompanied by his 2 daughters at today's office visit who have several questions regarding recent hospitalization and long-term prognosis and follow-up.  Patient and his daughters questions and concerns were addressed accordingly.  He also brings with him a written log of patient's home blood pressure readings which are soft as low as 95 mmHg systolic.  Patient denies dizziness, lightheadedness, syncope, near syncope.  Denies chest pain, orthopnea, PND, leg edema.  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) ([place])    Past Surgical History:  Procedure Laterality Date   APPENDECTOMY     CARDIAC CATHETERIZATION     CORONARY ANGIOPLASTY     PACEMAKER INSERTION     RIGHT/LEFT HEART CATH AND CORONARY ANGIOGRAPHY N/A 10/09/2021   Procedure: RIGHT/LEFT HEART CATH AND CORONARY ANGIOGRAPHY;  Surgeon: Nigel Mormon, MD;  Location: [facility];  Service: Cardiovascular;  Laterality: N/A;   Family History  Problem Relation Age of Onset   Breast cancer Mother    Diabetes Mellitus II Father    Ovarian cancer Sister    Cancer Brother    Cancer Brother    Cancer Maternal Uncle    Breast cancer Niece     Social History   Tobacco Use   Smoking status: Former    Packs/day:  0.50    Years: 60.00    Pack years: 30.00    Types: Cigarettes    Quit date: 09/2021    Years since quitting: 0.1   Smokeless tobacco: Never  Substance Use Topics   Alcohol use: Not Currently   Marital Status: Married   ROS  Review of Systems  Constitutional: Positive for malaise/fatigue. Negative for weight gain.   Cardiovascular:  Negative for chest pain, claudication, leg swelling, near-syncope, orthopnea, palpitations, paroxysmal nocturnal dyspnea and syncope.  Respiratory:  Negative for shortness of breath.   Neurological:  Negative for dizziness.   Objective  Blood pressure (!) 113/52, pulse 70, temperature 97.6 F (36.4 C), temperature source Temporal, resp. rate 16, height 5\' 6"  (1.676 m), weight 147 lb (66.7 kg), SpO2 100 %.  Vitals with BMI 10/25/2021 10/21/2021 10/21/2021  Height 5\' 6"  - -  Weight 147 lbs - -  BMI 38.75 - -  Systolic 643 329 -  Diastolic 52 49 -  Pulse 70 70 70      Physical Exam Vitals reviewed.  Constitutional:      Appearance: He is ill-appearing.     Comments: Frail  HENT:     Head: Normocephalic and atraumatic.  Cardiovascular:     Rate and Rhythm: Normal rate and regular rhythm.     Pulses: Intact distal pulses. Decreased pulses.     Heart sounds: S1 normal and S2 normal. No murmur heard.   No gallop.  Pulmonary:     Effort: Pulmonary effort is normal. No respiratory distress.     Breath sounds: No wheezing, rhonchi or rales.     Comments: Supplemental oxygen: 3 L/min via nasal cannula Musculoskeletal:     Right lower leg: No edema.     Left lower leg: No edema.  Neurological:     Mental Status: He is alert.    Laboratory examination:   Recent Labs    10/19/21 0132 10/20/21 0029 10/21/21 0036  NA 135 138 139  K 4.2 4.0 3.9  CL 98 101 97*  CO2 30 32 35*  GLUCOSE 190* 213* 168*  BUN 43* 28* 23  CREATININE 1.39* 1.35* 1.33*  CALCIUM 8.2* 8.3* 8.2*  GFRNONAA 52* 53* 54*   estimated creatinine clearance is 40.6 mL/min (A) (by C-G formula based on SCr of 1.33 mg/dL (H)).  CMP Latest Ref Rng & Units 10/21/2021 10/20/2021 10/19/2021  Glucose 70 - 99 mg/dL 168(H) 213(H) 190(H)  BUN 8 - 23 mg/dL 23 28(H) 43(H)  Creatinine 0.61 - 1.24 mg/dL 1.33(H) 1.35(H) 1.39(H)  Sodium 135 - 145 mmol/L 139 138 135  Potassium 3.5 - 5.1 mmol/L 3.9 4.0 4.2   Chloride 98 - 111 mmol/L 97(L) 101 98  CO2 22 - 32 mmol/L 35(H) 32 30  Calcium 8.9 - 10.3 mg/dL 8.2(L) 8.3(L) 8.2(L)  Total Protein 6.5 - 8.1 g/dL - - -  Total Bilirubin 0.3 - 1.2 mg/dL - - -  Alkaline Phos 38 - 126 U/L - - -  AST 15 - 41 U/L - - -  ALT 0 - 44 U/L - - -   CBC Latest Ref Rng & Units 10/21/2021 10/20/2021 10/19/2021  WBC 4.0 - 10.5 K/uL 5.3 6.8 9.7  Hemoglobin 13.0 - 17.0 g/dL 10.9(L) 10.7(L) 11.0(L)  Hematocrit 39.0 - 52.0 % 34.1(L) 33.7(L) 33.9(L)  Platelets 150 - 400 K/uL 249 250 265    Lipid Panel No results for input(s): CHOL, TRIG, LDLCALC, VLDL, HDL, CHOLHDL, LDLDIRECT in the last 8760 hours.  HEMOGLOBIN A1C Lab Results  Component Value Date   HGBA1C 6.7 (H) 10/07/2021   MPG 146 10/07/2021   TSH Recent Labs    07/16/21 1456 08/15/21 1121 10/06/21 2026  TSH 1.541 2.498 1.736    External labs:  None   Allergies  No Known Allergies   Medications Prior to Visit:   Outpatient Medications Prior to Visit  Medication Sig Dispense Refill   albuterol (PROVENTIL) (2.5 MG/3ML) 0.083% nebulizer solution Take 2.5 mg by nebulization every 4 (four) hours as needed for shortness of breath or wheezing.     budesonide-formoterol (SYMBICORT) 80-4.5 MCG/ACT inhaler Inhale 2 puffs into the lungs 2 (two) times daily.     clotrimazole-betamethasone (LOTRISONE) cream Apply 1 application topically 2 (two) times daily. 30 g 0   magnesium oxide (MAG-OX) 400 (241.3 Mg) MG tablet Take 400 mg by mouth 2 (two) times daily.     metFORMIN (GLUCOPHAGE) 1000 MG tablet Take 1,000 mg by mouth 2 (two) times daily.     metoprolol succinate (TOPROL-XL) 25 MG 24 hr tablet Take 1/2 tablet (12.5 mg total) by mouth daily. 15 tablet 0   amiodarone (PACERONE) 200 MG tablet Take 1 tablet (200 mg total) by mouth 2 (two) times daily. 60 tablet 0   apixaban (ELIQUIS) 5 MG TABS tablet Take 1 tablet (5 mg total) by mouth 2 (two) times daily. 60 tablet 0   atorvastatin (LIPITOR) 80 MG tablet  Take 80 mg by mouth at bedtime.     sacubitril-valsartan (ENTRESTO) 24-26 MG Take 1 tablet by mouth 2 (two) times daily. 60 tablet 0   Ferrous Fumarate (HEMOCYTE - 106 MG FE) 324 (106 Fe) MG TABS tablet Take 1 tablet by mouth. (Patient not taking: Reported on 10/17/2021)     No facility-administered medications prior to visit.   Final Medications at End of Visit    Current Meds  Medication Sig   albuterol (PROVENTIL) (2.5 MG/3ML) 0.083% nebulizer solution Take 2.5 mg by nebulization every 4 (four) hours as needed for shortness of breath or wheezing.   budesonide-formoterol (SYMBICORT) 80-4.5 MCG/ACT inhaler Inhale 2 puffs into the lungs 2 (two) times daily.   clotrimazole-betamethasone (LOTRISONE) cream Apply 1 application topically 2 (two) times daily.   furosemide (LASIX) 20 MG tablet Take 1 tablet (20 mg total) by mouth daily as needed for fluid or edema.   magnesium oxide (MAG-OX) 400 (241.3 Mg) MG tablet Take 400 mg by mouth 2 (two) times daily.   metFORMIN (GLUCOPHAGE) 1000 MG tablet Take 1,000 mg by mouth 2 (two) times daily.   metoprolol succinate (TOPROL-XL) 25 MG 24 hr tablet Take 1/2 tablet (12.5 mg total) by mouth daily.   [DISCONTINUED] amiodarone (PACERONE) 200 MG tablet Take 1 tablet (200 mg total) by mouth 2 (two) times daily.   [DISCONTINUED] apixaban (ELIQUIS) 5 MG TABS tablet Take 1 tablet (5 mg total) by mouth 2 (two) times daily.   [DISCONTINUED] atorvastatin (LIPITOR) 80 MG tablet Take 80 mg by mouth at bedtime.   [DISCONTINUED] sacubitril-valsartan (ENTRESTO) 24-26 MG Take 1 tablet by mouth 2 (two) times daily.   Radiology:   No results found.  Cardiac Studies:   Echocardiogram 10/06/2021: 1. Left ventricular ejection fraction, by estimation, is <20%. The left ventricle has severely decreased function. The left ventricle demonstrates global hypokinesis. The left ventricular internal cavity size was  moderately dilated. Left ventricular diastolic parameters are  indeterminate.   2. Right ventricular systolic function is normal. The right ventricular size is normal. There  is moderately elevated pulmonary artery systolic pressure.   3. Left atrial size was mild to moderately dilated.   4. Small to moderate pericardial effusion surrounds heart. Most apparent around RA, measures 13 mm in maximal dimension.   5. Mild mitral valve regurgitation.   6. Tricuspid valve regurgitation is mild to moderate.   7. Aortic valve regurgitation is mild.   8. Aortic dilatation noted. There is mild dilatation of the aortic root, measuring 39 mm.   9. The inferior vena cava is dilated in size with <50% respiratory variability, suggesting right atrial pressure of 15 mmHg.   Right/left heart catheterization and coronary angiography 10/09/2021: LM: Normal LAD: [place] mid LAD stent. No significant disease Lcx: No significant disease RCA: Ostial 100% occlusion. Left-to-right collateral up to mid RCA   RA: 11 mmHg RV: 51/0 mmHg PA: 64/22 mmHg, mPAP 34 mmHg PCW: 23 mmHg   CO: 3.6 L/min CI: 2.0 L/min/m2   Impression: Mildly decompensated nonischemic cardiomyopathy LV size and function out of proportion to RCA occlusion and patent mid LAD stent Recommend GDMT for heart failure  EKG:   10/25/2021: Ventricular paced rhythm at a rate of 70 bpm.  No further analysis.  10/17/2021: Atrial Fibrillation w/ PVCs, 76 bpm   Assessment     ICD-10-CM   1. Chronic systolic heart failure (HCC)  I50.22 EKG 12-Lead    2. ICD (implantable cardioverter-defibrillator) in place  Z95.810     3. Persistent atrial fibrillation (HCC)  I48.19     4. Coronary artery disease involving native coronary artery of native heart without angina pectoris  I25.10     5. Essential (primary) hypertension  I10     6. Tobacco use  Z72.0     7. Stage 3a chronic kidney disease (HCC)  N18.31        Medications Discontinued During This Encounter  Medication Reason   Ferrous Fumarate (HEMOCYTE -  106 MG FE) 324 (106 Fe) MG TABS tablet    atorvastatin (LIPITOR) 80 MG tablet Reorder   amiodarone (PACERONE) 200 MG tablet    sacubitril-valsartan (ENTRESTO) 24-26 MG Reorder   apixaban (ELIQUIS) 5 MG TABS tablet Reorder    Meds ordered this encounter  Medications   amiodarone (PACERONE) 200 MG tablet    Sig: Take 1 tablet (200 mg total) by mouth daily.    Dispense:  90 tablet    Refill:  3   apixaban (ELIQUIS) 5 MG TABS tablet    Sig: Take 1 tablet (5 mg total) by mouth 2 (two) times daily.    Dispense:  180 tablet    Refill:  3   atorvastatin (LIPITOR) 80 MG tablet    Sig: Take 1 tablet (80 mg total) by mouth at bedtime.    Dispense:  90 tablet    Refill:  3   sacubitril-valsartan (ENTRESTO) 24-26 MG    Sig: Take 0.5 tablets by mouth 2 (two) times daily.    Dispense:  90 tablet    Refill:  3   furosemide (LASIX) 20 MG tablet    Sig: Take 1 tablet (20 mg total) by mouth daily as needed for fluid or edema.    Dispense:  30 tablet    Refill:  3    Recommendations:   Trevian Kaelem Brach is a 80 y.o. Caucasian male  with hypertension, type 2 DM, COPD, nicotine dependence, CAD s/p ?MI/PCI (2015), ischemic cardiomyopathy with previously known LVEF 25%, s/p dual chamber ICD ([employer]), h/o atrial tachycardia, melanoma-currently  on monthly chemotherapy.   Patient presented 10/06/2021 with shortness of breath and influenza A positive.  Cardiology was consulted to manage acute on chronic HFrEF and acute hypoxic respiratory failure.  Previously last known EF was 25%, during recent hospitalization now <20% with moderately dilated LV.  Patient was not on any guideline directed medical therapy, therefore this was uptitrated and patient was diuresed.  During hospitalization patient found to have new onset atrial fibrillation for which she was started on Eliquis and amiodarone.  Patient discharged on Jardiance, metoprolol, Entresto, torsemide, digoxin, as well as atorvastatin.  He was discharged  10/11/2021.  Patient was subsequently readmitted 10/17/2021 - 10/21/2021 with aspiration pneumonia and COPD, AKI, therefore digoxin, Jardiance, torsemide were discontinued.  Patient was discharged on Entresto and metoprolol, as well as amiodarone and Eliquis for A. fib and atorvastatin for hyperlipidemia.  Patient now presents to our office for outpatient follow-up and management of ischemic cardiomyopathy (LVEF <20% 10/07/2021) and atrial fibrillation.  Our office will work to set patient up for remote patient monitoring of ICD with [place] instead of with [facility].  In regard to atrial fibrillation, patient is tolerating anticoagulation without bleeding diathesis.  We will reduce amiodarone from 200 mg twice daily to 200 mg once daily.  Atrial fibrillation with RVR may have contributed to further worsening of patient's LVEF.  Patient blood pressure is quite soft, will therefore reduce Entresto to half tablet twice daily.  We will continue Toprol-XL 12.5 mg daily.  Unfortunately up titration of guideline directed medical therapy is limited by his blood pressure pressure.  Given recent AKI and low BP will hold off on additional medication changes at this time.  Have requested that PCPs office to BMP, proBNP, and CBC at follow-up visit scheduled next week.  I have also prescribed furosemide for patient to have on hand for as needed use.  Will enroll patient in both remote heart failure and blood pressure monitoring with our office.  Continue Eliquis and atorvastatin as previously prescribed.  Follow-up in 6 weeks, sooner if needed, for HFrEF and atrial fibrillation.   Alethia Berthold, PA-C 10/25/2021, 3:29 PM Office: [phone]

## 2021-10-28 ENCOUNTER — Other Ambulatory Visit: Payer: Self-pay | Admitting: Pharmacist

## 2021-10-28 MED ORDER — FUROSEMIDE 20 MG PO TABS
20.0000 mg | ORAL_TABLET | Freq: Every day | ORAL | 1 refills | Status: DC | PRN
Start: 2021-10-28 — End: 2022-04-28

## 2021-10-30 ENCOUNTER — Other Ambulatory Visit: Payer: Self-pay | Admitting: Pharmacist

## 2021-10-30 ENCOUNTER — Other Ambulatory Visit: Payer: Self-pay | Admitting: Hematology and Oncology

## 2021-10-30 DIAGNOSIS — C4339 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: Secondary | ICD-10-CM

## 2021-10-30 DIAGNOSIS — I5040 Unspecified combined systolic (congestive) and diastolic (congestive) heart failure: Secondary | ICD-10-CM | POA: Diagnosis not present

## 2021-10-30 DIAGNOSIS — N179 Acute kidney failure, unspecified: Secondary | ICD-10-CM | POA: Diagnosis not present

## 2021-10-31 ENCOUNTER — Telehealth: Payer: Self-pay

## 2021-10-31 NOTE — Telephone Encounter (Signed)
Patient's daughter called stating that patients leg swelling is getting worse. He was weighed from one day to the next and he went from 146 to 148 and she said that it is more noticeable today and she also had him elevate his feet and has been taking Furosemide for the last two days, as it is an "as needed" medication. Patient has not complained of chest pain or shortness of breath and also has not yet taken his Furosemide this morning, but wants to know if she needs to increase it this morning.

## 2021-10-31 NOTE — Telephone Encounter (Addendum)
°-----   Message from [name], PA-C sent at 10/31/2021  4:00 PM EST ----- Regarding: RE: Palliative Care @ home request Palliative care, not hospice, is fine. If hospice, would like to confirm with Dr. Bishop Dublin. Thanks! ----- Message ----- From: Dairl Ponder, RN Sent: 10/31/2021   3:53 PM EST To: [name], PA-C Subject: Palliative Care @ home request                 Received call from [facility]. Pt was referred to them @ hospital d/c. Is it ok with providers here?  [phone], option 2

## 2021-11-01 ENCOUNTER — Encounter: Payer: Self-pay | Admitting: Oncology

## 2021-11-05 ENCOUNTER — Telehealth: Payer: Self-pay | Admitting: Student

## 2021-11-05 NOTE — Telephone Encounter (Signed)
Attempted to contact patient's daughter, [name], to schedule Palliative Consult, no answer and unable to leave a message due to no VM set up.

## 2021-11-05 NOTE — Telephone Encounter (Signed)
Spoke with daughter [name] regarding the Palliative referral and she requested that I speak with her sister, Milford Cilento, that lives with patient regarding scheduling visit.  She requested I call her back around 10, because she works 2nd shift.

## 2021-11-12 ENCOUNTER — Telehealth: Payer: Self-pay | Admitting: Student

## 2021-11-12 NOTE — Telephone Encounter (Signed)
Spoke with patient's daughter [name], regarding the Palliative referral/services and all questions were answered and she was in agreement with scheduling visit.  I have scheduled an In-home Consult for 11/14/21 @ 9 AM

## 2021-11-12 NOTE — Progress Notes (Signed)
External labs 10/31/2021: BUN 17, creatinine 1.09, GFR 69, sodium 142, potassium 4.7 BNP 396.8 Hemoglobin 10.2, hematocrit 31.8, MCV 94, platelet 177

## 2021-11-14 ENCOUNTER — Other Ambulatory Visit: Payer: Medicare PPO | Admitting: Student

## 2021-11-14 ENCOUNTER — Other Ambulatory Visit: Payer: Self-pay

## 2021-11-14 DIAGNOSIS — Z87891 Personal history of nicotine dependence: Secondary | ICD-10-CM

## 2021-11-14 DIAGNOSIS — I5042 Chronic combined systolic (congestive) and diastolic (congestive) heart failure: Secondary | ICD-10-CM

## 2021-11-14 DIAGNOSIS — C4339 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: Secondary | ICD-10-CM

## 2021-11-14 DIAGNOSIS — R0602 Shortness of breath: Secondary | ICD-10-CM

## 2021-11-14 DIAGNOSIS — R531 Weakness: Secondary | ICD-10-CM | POA: Diagnosis not present

## 2021-11-14 DIAGNOSIS — Z515 Encounter for palliative care: Secondary | ICD-10-CM

## 2021-11-14 NOTE — Progress Notes (Signed)
[facility] Consult Note Telephone: [phone]  Fax: [phone]   Date of encounter: 11/14/21 9:25 AM PATIENT NAME: Kevin [name] [place] [address]   [phone] (home)  DOB: 1941/08/10 MRN: [id number] PRIMARY CARE PROVIDER:    Fae Pippin  [place] [address] [phone]  REFERRING PROVIDER:   Rosanne Sack, PA  RESPONSIBLE PARTY:    Contact Information     Name Relation Home Work [place], [place] Daughter   [phone]   [place], [place] Daughter   [phone]        I met face to face with patient and family in the home/facility. Palliative Care was asked to follow this patient by consultation request of  [name], PA-C to address advance care planning and complex medical decision making. This is the initial visit.                                     ASSESSMENT AND PLAN / RECOMMENDATIONS:   Advance Care Planning/Goals of Care: Goals include to maximize quality of life and symptom management. Patient/health care surrogate gave his/her permission to discuss.Our advance care planning conversation included a discussion about:    The value and importance of advance care planning  Experiences with loved ones who have been seriously ill or have died  Exploration of personal, cultural or spiritual beliefs that might influence medical decisions  Exploration of goals of care in the event of a sudden injury or illness  CODE STATUS: Full Code  Education provided on Palliative Medicine vs. Hospice services. Patient would like to continue [facility] therapy; he also plans to resume chemotherapy this month. Palliative Medicine will continue to provide support, symptom management.   Symptom Management/Plan:  Chronic systolic heart failure-continue Entresto, metoprolol, amiodarone as directed. Continue to weigh daily; take furosemide 20 mg daily PRN for increased fluid, edema.  Patient does endorse shortness of breath, pedal edema.  Shortness of breath-with exertion. Continue oxygen at 3 lpm. Continue Symbicort, PRN albuterol nebulizer as directed.   Smoking Cessation-patient stopped smoking 2 weeks ago. He is now having urges to smoke again. Education provided on smoking cessation. He would like to try OTC Nicoderm patch; 60m/ 24 hr script sent to pharmacy.  Generalized weakness-continue HH Therapy as directed. Encouraged to use assistive device for ambulation.   Melanoma-malignant melanoma of face. Patient plans to resume chemotherapy this month. Follow up with Oncology as scheduled.   Follow up Palliative Care Visit: Palliative care will continue to follow for complex medical decision making, advance care planning, and clarification of goals. Return in 8 weeks or prn.   This visit was coded based on medical decision making (MDM).  PPS: 50%  HOSPICE ELIGIBILITY/DIAGNOSIS: TBD  Chief Complaint: Palliative Medicine initial consult.   HISTORY OF PRESENT ILLNESS:  Kaamil GLani Mendiolais a 81y.o. year old male  with aspiration pneumonia, AKI on CKD, chronic systolic HF, atrial fibrillation, T2DM, COPD, anemia, stage II melanoma.   Patient resides at home with family. He states he is feeling better. He stopped smoking 2 weeks ago, but is feeling the urge to start smoking again. He would like to restart nicotine patches. [facility] He does require some assistance with adl's; receiving help with bathing. Has a walking cane in the home. Uses walker when going out of the home.  [place] a week before Christmas; no falls since. He  endorses shortness of breath with exertion. Did not wear oxygen prior to hospitalization. Denies pain, occasional constipation. Appetite is good. Daughter checking blood sugars, running just above 100 mg/dL. His healthy weight was 185-190 pounds up until 2-3 years ago when he had abdominal surgery. Weighs most days; taking furosemide  PRN.   History obtained from review of EMR, discussion with primary team, and interview with family, facility staff/caregiver and/or Mr. Stawicki.  I reviewed available labs, medications, imaging, studies and related documents from the EMR.  Records reviewed and summarized above.   ROS  General: NAD EYES: denies vision changes ENMT: denies dysphagia Cardiovascular: denies chest pain, denies DOE Pulmonary: denies cough, denies increased SOB Abdomen: endorses good appetite, endorses continence of bowel GU: denies dysuria, endorses continence of urine MSK: weakness Skin: denies rashes Neurological: denies pain, denies insomnia Psych: Endorses positive mood Heme/lymph/immuno: denies bruises, abnormal bleeding  Physical Exam: Weight: 143 pounds Pulse 62, resp 16, bp 120/60, sats 95% on 3 lpm Constitutional: NAD General: frail appearing, thin  EYES: anicteric sclera, lids intact, no discharge  ENMT: intact hearing, oral mucous membranes moist, dentition intact CV: S1S2, RRR, bilateral pedal edema, L > R Pulmonary: LCTA, slightly diminished, no increased work of breathing, no cough Abdomen: normo-active BS + 4 quadrants, soft and non tender, no ascites GU: deferred WUJ:[id number], moves all extremities, ambulatory Skin: warm and dry, no rashes on visible skin Neuro: generalized weakness, A & O x 3 Psych: non-anxious affect, pleasant Hem/lymph/immuno: no widespread bruising CURRENT PROBLEM LIST:  Patient Active Problem List   Diagnosis Date Noted   Protein-calorie malnutrition, severe 10/18/2021   CAP (community acquired pneumonia) 10/17/2021   Moderate protein-calorie malnutrition ([place]) 10/17/2021   Atrial fibrillation with RVR (HCC)    HFrEF (heart failure with reduced ejection fraction) ([place])    Influenza A 10/06/2021   Macrocytic anemia 07/16/2021   Hypomagnesemia 10/17/2020    Class: Chronic   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 ([place]) 07/29/2020   B12 deficiency  anemia 08/19/2018    Class: Chronic   Hypoglycemia 04/21/2018   Chronic combined systolic and diastolic CHF (congestive heart failure) ([place]) 04/21/2018   Essential hypertension 04/21/2018   Diabetes mellitus type 2 in nonobese ([place]) 04/21/2018   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) ([place]) 03/20/2015    Class: Chronic   PAST MEDICAL HISTORY:  Active Ambulatory Problems    Diagnosis Date Noted   Hypoglycemia 04/21/2018   Chronic combined systolic and diastolic CHF (congestive heart failure) ([place]) 04/21/2018   Essential hypertension 04/21/2018   Diabetes mellitus type 2 in nonobese ([place]) 04/21/2018   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 ([place]) 07/29/2020   Hypomagnesemia 10/17/2020   Macrocytic anemia 07/16/2021   B12 deficiency anemia 08/19/2018   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) ([place]) 03/20/2015   Influenza A 10/06/2021   HFrEF (heart failure with reduced ejection fraction) (HCC)    Atrial fibrillation with RVR (HCC)    CAP (community acquired pneumonia) 10/17/2021   Moderate protein-calorie malnutrition ([place]) 10/17/2021   Protein-calorie malnutrition, severe 10/18/2021   Resolved Ambulatory Problems    Diagnosis Date Noted   No Resolved Ambulatory Problems   Past Medical History:  Diagnosis Date   Cancer ([place])    CHF (congestive heart failure) (HCC)    COPD (chronic obstructive pulmonary disease) (HCC)    Diabetes mellitus without complication ([place])    History of myocardial infarction  Hyperlipidemia    Hypertension    SOCIAL HX:  Social History   Tobacco Use   Smoking status: Former    Packs/day: 0.50    Years: 60.00    Pack years: 30.00    Types: Cigarettes    Quit date: 09/2021    Years since quitting: 0.1   Smokeless tobacco: Never  Substance Use Topics   Alcohol use: Not Currently   FAMILY HX:  Family History  Problem Relation Age of Onset   Breast cancer Mother    Diabetes Mellitus II Father    Ovarian cancer Sister     Cancer Brother    Cancer Brother    Cancer Maternal Uncle    Breast cancer Niece       ALLERGIES: No Known Allergies   PERTINENT MEDICATIONS:  Outpatient Encounter Medications as of 11/14/2021  Medication Sig   albuterol (PROVENTIL) (2.5 MG/3ML) 0.083% nebulizer solution Take 2.5 mg by nebulization every 4 (four) hours as needed for shortness of breath or wheezing.   amiodarone (PACERONE) 200 MG tablet Take 1 tablet (200 mg total) by mouth daily.   apixaban (ELIQUIS) 5 MG TABS tablet Take 1 tablet (5 mg total) by mouth 2 (two) times daily.   atorvastatin (LIPITOR) 80 MG tablet Take 1 tablet (80 mg total) by mouth at bedtime.   budesonide-formoterol (SYMBICORT) 80-4.5 MCG/ACT inhaler Inhale 2 puffs into the lungs 2 (two) times daily.   clotrimazole-betamethasone (LOTRISONE) cream Apply 1 application topically 2 (two) times daily.   furosemide (LASIX) 20 MG tablet Take 1 tablet (20 mg total) by mouth daily as needed for fluid or edema.   magnesium oxide (MAG-OX) 400 (241.3 Mg) MG tablet Take 400 mg by mouth 2 (two) times daily.   metFORMIN (GLUCOPHAGE) 1000 MG tablet Take 1,000 mg by mouth 2 (two) times daily.   metoprolol succinate (TOPROL-XL) 25 MG 24 hr tablet Take 1/2 tablet (12.5 mg total) by mouth daily.   sacubitril-valsartan (ENTRESTO) 24-26 MG Take 0.5 tablets by mouth 2 (two) times daily.   No facility-administered encounter medications on file as of 11/14/2021.   Thank you for the opportunity to participate in the care of Mr. Boesel.  The palliative care team will continue to follow. Please call our office at [phone] if we can be of additional assistance.   Ezekiel Slocumb, NP   COVID-19 PATIENT SCREENING TOOL Asked and negative response unless otherwise noted:  Have you had symptoms of covid, tested positive or been in contact with someone with symptoms/positive test in the past 5-10 days? No

## 2021-11-19 NOTE — Progress Notes (Signed)
Re-enrolled patient into copay assistance through the [facility].  Member ID [id number] 08/22/2021 - 08/21/2022

## 2021-11-22 ENCOUNTER — Encounter: Payer: Self-pay | Admitting: Oncology

## 2021-11-29 ENCOUNTER — Telehealth: Payer: Self-pay | Admitting: Hematology and Oncology

## 2021-11-29 ENCOUNTER — Other Ambulatory Visit: Payer: Self-pay | Admitting: Hematology and Oncology

## 2021-11-29 DIAGNOSIS — C4339 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: Secondary | ICD-10-CM

## 2021-11-29 NOTE — Telephone Encounter (Signed)
Attempted to contact patients daughter to notify her of CT scan instructions, date and time.  CT C/A/P w/ contrast is scheduled for 12/06/21 @ 10:30; pt to check in at 9:30 (labs)  Pt does need to schedule an appt with Dr.McCarty as well. I was unable to reach anyone via phone, lvm requesting a call back.

## 2021-12-03 ENCOUNTER — Telehealth: Payer: Self-pay | Admitting: Hematology and Oncology

## 2021-12-03 ENCOUNTER — Telehealth: Payer: Self-pay | Admitting: Oncology

## 2021-12-03 NOTE — Telephone Encounter (Signed)
Patients daughter requested CT scan be R/S'd. New appt is for 12/13/21 @ 3pm; check in at 2. Daughter aware of new appt time and of instructions.

## 2021-12-03 NOTE — Telephone Encounter (Signed)
Patient's daughter has been notified of appt for CT Scan and follow-up with Dr. Hinton Rao. I had offered her a sooner appt to review CT results but daughter really needed an afternoon appt.

## 2021-12-04 NOTE — Progress Notes (Signed)
Primary Physician/Referring:  [name], PA-C  Patient ID: Kevin [name], male    DOB: May 11, 1941, 81 y.o.   MRN: [id number]  Chief Complaint  Patient presents with   Congestive Heart Failure   Coronary Artery Disease   Follow-up   HPI:    Kevin [name]  is a 81 y.o. Caucasian male  with hypertension, type 2 DM, COPD, nicotine dependence, CAD s/p ?MI/PCI (2015), ischemic cardiomyopathy with previously known LVEF 25%, s/p dual chamber ICD ([employer]), h/o atrial tachycardia, melanoma-currently on monthly chemotherapy.   Patient patient was admitted 10/06/2021 with atrial fibrillation with RVR and acute on chronic HFrEF, patient was started on guideline directed medical therapy and diuresed, subsequently discharged 10/11/2021.  Patient readmitted 10/17/2021 - 10/21/2021 with aspiration pneumonia, COPD exacerbation, and AKI, therefore Jardiance and digoxin were discontinued at that time.  Patient presents for 6-week follow-up.  At last office visit reduced amiodarone from 200 mg twice daily to once daily, reduced Entresto to half tablet twice daily, and prescribed furosemide as needed use.  Patient has been taking Lasix on average 3 days/week when his bilateral leg edema is worse.  He is monitoring weight at home stating it remained stable 147-150 pounds, however notably weight has trended up from 10/25/2021 by about 8 pounds.  Patient states overall he is feeling well.  Denies chest pain, orthopnea, PND, dizziness, lightheadedness, syncope, near syncope.  He continues to have intermittent leg edema, for which he takes Lasix as needed.  He also continues to have dyspnea on exertion which is stable.  He uses oxygen at home while sleeping and exerting himself.  Since last office visit patient has been seen by palliative care for in-home consult, they will continue to follow regarding goals of care.  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) ([place])    Past Surgical History:  Procedure Laterality Date   APPENDECTOMY     CARDIAC CATHETERIZATION     CORONARY ANGIOPLASTY     PACEMAKER INSERTION     RIGHT/LEFT HEART CATH AND CORONARY ANGIOGRAPHY N/A 10/09/2021   Procedure: RIGHT/LEFT HEART CATH AND CORONARY ANGIOGRAPHY;  Surgeon: Nigel Mormon, MD;  Location: [facility];  Service: Cardiovascular;  Laterality: N/A;   Family History  Problem Relation Age of Onset   Breast cancer Mother    Diabetes Mellitus II Father    Ovarian cancer Sister    Cancer Brother    Cancer Brother    Cancer Maternal Uncle    Breast cancer Niece     Social History   Tobacco Use   Smoking status: Former    Packs/day: 0.50    Years: 60.00    Pack years: 30.00    Types: Cigarettes    Quit date: 09/2021    Years since quitting: 0.2   Smokeless tobacco: Never  Substance Use Topics   Alcohol use: Not Currently   Marital Status: Married   ROS  Review of Systems  Constitutional: Positive for malaise/fatigue (improving). Negative for weight gain.  Cardiovascular:  Negative for chest  pain, claudication, leg swelling, near-syncope, orthopnea, palpitations, paroxysmal nocturnal dyspnea and syncope.  Respiratory:  Negative for shortness of breath.   Neurological:  Negative for dizziness.   Objective  Blood pressure (!) 120/57, pulse 77, temperature 98.3 F (36.8 C), temperature source Temporal, resp. rate 16, height 5\' 6"  (1.676 m), weight 155 lb (70.3 kg), SpO2 98 %.  Vitals with BMI 12/06/2021 10/25/2021 10/21/2021  Height 5\' 6"  5\' 6"  -  Weight 155 lbs 147 lbs -  BMI 75.10 25.85 -   Systolic 277 824 235  Diastolic 57 52 49  Pulse 77 70 70      Physical Exam Vitals reviewed.  Constitutional:      Appearance: He is ill-appearing.     Comments: Frail  Cardiovascular:     Rate and Rhythm: Normal rate and regular rhythm.     Pulses: Intact distal pulses. Decreased pulses.     Heart sounds: S1 normal and S2 normal. No murmur heard.   No gallop.  Pulmonary:     Effort: Pulmonary effort is normal. No respiratory distress.     Breath sounds: No wheezing, rhonchi or rales.     Comments: Supplemental oxygen: 3 L/min via nasal cannula Musculoskeletal:     Right lower leg: Edema (1+) present.     Left lower leg: Edema (1+) present.  Neurological:     Mental Status: He is alert.    Laboratory examination:   Recent Labs    10/19/21 0132 10/20/21 0029 10/21/21 0036  NA 135 138 139  K 4.2 4.0 3.9  CL 98 101 97*  CO2 30 32 35*  GLUCOSE 190* 213* 168*  BUN 43* 28* 23  CREATININE 1.39* 1.35* 1.33*  CALCIUM 8.2* 8.3* 8.2*  GFRNONAA 52* 53* 54*   CrCl cannot be calculated (Patient's most recent lab result is older than the maximum 21 days allowed.).  CMP Latest Ref Rng & Units 10/21/2021 10/20/2021 10/19/2021  Glucose 70 - 99 mg/dL 168(H) 213(H) 190(H)  BUN 8 - 23 mg/dL 23 28(H) 43(H)  Creatinine 0.61 - 1.24 mg/dL 1.33(H) 1.35(H) 1.39(H)  Sodium 135 - 145 mmol/L 139 138 135  Potassium 3.5 - 5.1 mmol/L 3.9 4.0 4.2  Chloride 98 - 111 mmol/L 97(L) 101 98  CO2 22 - 32 mmol/L 35(H) 32 30  Calcium 8.9 - 10.3 mg/dL 8.2(L) 8.3(L) 8.2(L)  Total Protein 6.5 - 8.1 g/dL - - -  Total Bilirubin 0.3 - 1.2 mg/dL - - -  Alkaline Phos 38 - 126 U/L - - -  AST 15 - 41 U/L - - -  ALT 0 - 44 U/L - - -   CBC Latest Ref Rng & Units 10/21/2021 10/20/2021 10/19/2021  WBC 4.0 - 10.5 K/uL 5.3 6.8 9.7  Hemoglobin 13.0 - 17.0 g/dL 10.9(L) 10.7(L) 11.0(L)  Hematocrit 39.0 - 52.0 % 34.1(L) 33.7(L) 33.9(L)  Platelets 150 - 400 K/uL 249 250 265    Lipid Panel No results for  input(s): CHOL, TRIG, LDLCALC, VLDL, HDL, CHOLHDL, LDLDIRECT in the last 8760 hours.  HEMOGLOBIN A1C Lab Results  Component Value Date   HGBA1C 6.7 (H) 10/07/2021   MPG 146 10/07/2021   TSH Recent Labs    07/16/21 1456 08/15/21 1121 10/06/21 2026  TSH 1.541 2.498 1.736    External labs:  10/31/2021: BUN 17, creatinine 1.09, GFR 69, sodium 142, potassium 4.7 BNP 396.8 Hemoglobin 10.2, hematocrit 31.8, MCV 94, platelet 177  Allergies  No Known Allergies   Medications Prior to Visit:  Outpatient Medications Prior to Visit  Medication Sig Dispense Refill   albuterol (PROVENTIL) (2.5 MG/3ML) 0.083% nebulizer solution Take 2.5 mg by nebulization every 4 (four) hours as needed for shortness of breath or wheezing.     amiodarone (PACERONE) 200 MG tablet Take 1 tablet (200 mg total) by mouth daily. 90 tablet 3   apixaban (ELIQUIS) 5 MG TABS tablet Take 1 tablet (5 mg total) by mouth 2 (two) times daily. 180 tablet 3   atorvastatin (LIPITOR) 80 MG tablet Take 1 tablet (80 mg total) by mouth at bedtime. 90 tablet 3   budesonide-formoterol (SYMBICORT) 80-4.5 MCG/ACT inhaler Inhale 1 puff into the lungs 2 (two) times daily.     furosemide (LASIX) 20 MG tablet Take 1 tablet (20 mg total) by mouth daily as needed for fluid or edema. 14 tablet 1   magnesium oxide (MAG-OX) 400 (241.3 Mg) MG tablet Take 400 mg by mouth 2 (two) times daily.     metFORMIN (GLUCOPHAGE) 1000 MG tablet Take 1,000 mg by mouth 2 (two) times daily.     metoprolol succinate (TOPROL-XL) 25 MG 24 hr tablet Take 1/2 tablet (12.5 mg total) by mouth daily. 15 tablet 0   sacubitril-valsartan (ENTRESTO) 24-26 MG Take 0.5 tablets by mouth 2 (two) times daily. 90 tablet 3   clotrimazole-betamethasone (LOTRISONE) cream Apply 1 application topically 2 (two) times daily. 30 g 0   No facility-administered medications prior to visit.   Final Medications at End of Visit    Current Meds  Medication Sig   albuterol (PROVENTIL)  (2.5 MG/3ML) 0.083% nebulizer solution Take 2.5 mg by nebulization every 4 (four) hours as needed for shortness of breath or wheezing.   amiodarone (PACERONE) 200 MG tablet Take 1 tablet (200 mg total) by mouth daily.   apixaban (ELIQUIS) 5 MG TABS tablet Take 1 tablet (5 mg total) by mouth 2 (two) times daily.   atorvastatin (LIPITOR) 80 MG tablet Take 1 tablet (80 mg total) by mouth at bedtime.   budesonide-formoterol (SYMBICORT) 80-4.5 MCG/ACT inhaler Inhale 1 puff into the lungs 2 (two) times daily.   furosemide (LASIX) 20 MG tablet Take 1 tablet (20 mg total) by mouth daily as needed for fluid or edema.   magnesium oxide (MAG-OX) 400 (241.3 Mg) MG tablet Take 400 mg by mouth 2 (two) times daily.   metFORMIN (GLUCOPHAGE) 1000 MG tablet Take 1,000 mg by mouth 2 (two) times daily.   metoprolol succinate (TOPROL-XL) 25 MG 24 hr tablet Take 1/2 tablet (12.5 mg total) by mouth daily.   sacubitril-valsartan (ENTRESTO) 24-26 MG Take 0.5 tablets by mouth 2 (two) times daily.   Radiology:   No results found.  Cardiac Studies:   Echocardiogram 10/06/2021: 1. Left ventricular ejection fraction, by estimation, is <20%. The left ventricle has severely decreased function. The left ventricle demonstrates global hypokinesis. The left ventricular internal cavity size was  moderately dilated. Left ventricular diastolic parameters are indeterminate.   2. Right ventricular systolic function is normal. The right ventricular size is normal. There is moderately elevated pulmonary artery systolic pressure.   3. Left atrial size was mild to moderately dilated.   4. Small to moderate pericardial effusion surrounds heart. Most apparent around RA, measures 13 mm in maximal dimension.   5. Mild mitral valve regurgitation.   6. Tricuspid valve regurgitation is mild to moderate.   7. Aortic valve regurgitation is mild.   8. Aortic dilatation noted. There is mild dilatation of the aortic root, measuring 39 mm.  9.  The inferior vena cava is dilated in size with <50% respiratory variability, suggesting right atrial pressure of 15 mmHg.   Right/left heart catheterization and coronary angiography 10/09/2021: LM: Normal LAD: [place] mid LAD stent. No significant disease Lcx: No significant disease RCA: Ostial 100% occlusion. Left-to-right collateral up to mid RCA   RA: 11 mmHg RV: 51/0 mmHg PA: 64/22 mmHg, mPAP 34 mmHg PCW: 23 mmHg   CO: 3.6 L/min CI: 2.0 L/min/m2   Impression: Mildly decompensated nonischemic cardiomyopathy LV size and function out of proportion to RCA occlusion and patent mid LAD stent Recommend GDMT for heart failure  EKG:   10/25/2021: Ventricular paced rhythm at a rate of 70 bpm.  No further analysis.  10/17/2021: Atrial Fibrillation w/ PVCs, 76 bpm   Assessment     ICD-10-CM   1. Chronic systolic heart failure (HCC)  I50.22 PCV ECHOCARDIOGRAM COMPLETE    2. ICD (implantable cardioverter-defibrillator) in place  Z95.810     3. Persistent atrial fibrillation (HCC)  I48.19     4. Coronary artery disease involving native coronary artery of native heart without angina pectoris  I25.10        Medications Discontinued During This Encounter  Medication Reason   clotrimazole-betamethasone (LOTRISONE) cream     No orders of the defined types were placed in this encounter.   Recommendations:   Kevin [name] is a 81 y.o. Caucasian male  with hypertension, type 2 DM, COPD, nicotine dependence, CAD s/p ?MI/PCI (2015), ischemic cardiomyopathy with previously known LVEF 25%, s/p dual chamber ICD ([employer] Jude), h/o atrial tachycardia, melanoma-currently on monthly chemotherapy.   Patient patient was admitted 10/06/2021 with atrial fibrillation with RVR and acute on chronic HFrEF, patient was started on guideline directed medical therapy and diuresed, subsequently discharged 10/11/2021.  Patient readmitted 10/17/2021 - 10/21/2021 with aspiration pneumonia, COPD exacerbation, and  AKI, therefore Jardiance and digoxin were discontinued at that time.  Patient presents for 6-week follow-up.  At last office visit reduced amiodarone from 200 mg twice daily to once daily, reduced Entresto to half tablet twice daily, and prescribed furosemide as needed use.  Patient is tolerating reduced dose of Entresto and fatigue is improved.  Upon further discussion it is unclear whether patient is currently taking metoprolol, his daughter is worried she may not have it at home.  Patient's renal function has improved to baseline.  Discussed consideration of spironolactone or SGLT2 inhibitor addition, however given that it is unclear if patient is currently taking medications as prescribed we will hold off on making changes at this time.  Have provided a list of medications for patient's daughter to review and manage with the bottles at home.  She will call our office early next week to complete medication reconciliation.  Overall patient is doing well.  His weight is up and he has bilateral leg edema.  Advised that he take Lasix 20 mg for the next 5 days.  Suspect episode of A. fib with RVR contributed to worsening LVEF, will therefore repeat echocardiogram in 1 month to reevaluate.  There is no clinical evidence of acute decompensated heart failure. Also encouraged patient to notify our office of his goals of care as determined with palliative team.   Follow up in 8 weeks, sooner if needed.    Alethia Berthold, PA-C 12/06/2021, 1:32 PM Office: [phone]

## 2021-12-05 ENCOUNTER — Ambulatory Visit: Payer: Medicare PPO | Admitting: Student

## 2021-12-06 ENCOUNTER — Encounter: Payer: Self-pay | Admitting: Student

## 2021-12-06 ENCOUNTER — Ambulatory Visit: Payer: Medicare PPO | Admitting: Student

## 2021-12-06 ENCOUNTER — Other Ambulatory Visit: Payer: Self-pay

## 2021-12-06 VITALS — BP 120/57 | HR 77 | Temp 98.3°F | Resp 16 | Ht 66.0 in | Wt 155.0 lb

## 2021-12-06 DIAGNOSIS — I5022 Chronic systolic (congestive) heart failure: Secondary | ICD-10-CM

## 2021-12-06 DIAGNOSIS — Z95810 Presence of automatic (implantable) cardiac defibrillator: Secondary | ICD-10-CM

## 2021-12-06 DIAGNOSIS — I4819 Other persistent atrial fibrillation: Secondary | ICD-10-CM | POA: Diagnosis not present

## 2021-12-06 DIAGNOSIS — I2510 Atherosclerotic heart disease of native coronary artery without angina pectoris: Secondary | ICD-10-CM | POA: Diagnosis not present

## 2021-12-12 ENCOUNTER — Encounter: Payer: Self-pay | Admitting: Hematology and Oncology

## 2021-12-13 DIAGNOSIS — K8020 Calculus of gallbladder without cholecystitis without obstruction: Secondary | ICD-10-CM | POA: Diagnosis not present

## 2021-12-13 DIAGNOSIS — R918 Other nonspecific abnormal finding of lung field: Secondary | ICD-10-CM | POA: Diagnosis not present

## 2021-12-13 DIAGNOSIS — C439 Malignant melanoma of skin, unspecified: Secondary | ICD-10-CM | POA: Diagnosis not present

## 2021-12-13 DIAGNOSIS — J984 Other disorders of lung: Secondary | ICD-10-CM | POA: Diagnosis not present

## 2021-12-13 DIAGNOSIS — N281 Cyst of kidney, acquired: Secondary | ICD-10-CM | POA: Diagnosis not present

## 2021-12-13 DIAGNOSIS — R946 Abnormal results of thyroid function studies: Secondary | ICD-10-CM | POA: Diagnosis not present

## 2021-12-13 DIAGNOSIS — I2510 Atherosclerotic heart disease of native coronary artery without angina pectoris: Secondary | ICD-10-CM | POA: Diagnosis not present

## 2021-12-13 DIAGNOSIS — M160 Bilateral primary osteoarthritis of hip: Secondary | ICD-10-CM | POA: Diagnosis not present

## 2021-12-13 DIAGNOSIS — C4339 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: Secondary | ICD-10-CM | POA: Diagnosis not present

## 2021-12-13 DIAGNOSIS — I700 Atherosclerosis of aorta: Secondary | ICD-10-CM | POA: Diagnosis not present

## 2021-12-13 LAB — CBC AND DIFFERENTIAL
HCT: 32 — AB (ref 41–53)
Hemoglobin: 10.4 — AB (ref 13.5–17.5)
Neutrophils Absolute: 3.15
Platelets: 258 (ref 150–399)
WBC: 4.2

## 2021-12-13 LAB — BASIC METABOLIC PANEL
BUN: 13 (ref 4–21)
CO2: 29 — AB (ref 13–22)
Chloride: 106 (ref 99–108)
Creatinine: 0.9 (ref 0.6–1.3)
Glucose: 82
Potassium: 4.2 (ref 3.4–5.3)
Sodium: 140 (ref 137–147)

## 2021-12-13 LAB — HEPATIC FUNCTION PANEL
ALT: 12 (ref 10–40)
AST: 21 (ref 14–40)
Alkaline Phosphatase: 66 (ref 25–125)
Bilirubin, Total: 0.8

## 2021-12-13 LAB — COMPREHENSIVE METABOLIC PANEL
Albumin: 3.7 (ref 3.5–5.0)
Calcium: 9.2 (ref 8.7–10.7)

## 2021-12-13 LAB — CBC: RBC: 3.42 — AB (ref 3.87–5.11)

## 2021-12-13 NOTE — Progress Notes (Signed)
[name]  [address] [place],  [place]  [address] [phone]  Clinic Day:  12/18/2021  Referring physician: [name], PA-C  This document serves as a record of services personally performed by [name], MD. It was created on their behalf by [name],[name], a trained medical scribe. The creation of this record is based on the scribe's personal observations and the provider's statements to them.  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 CT neck and chest in June did not reveal any evidence of progressive disease. CT imaging from February 2023 remains without evidence of metastatic disease. His disease remains under control and so we will put treatment on hold until his cardiorespiratory issues are addressed.  Macrocytic anemia Macrocytic anemia, despite monthly B12 injections. Since he has missed some doses, I will recheck a B12 level when he returns.  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 Findings are most consistent with an infectious or inflammatory process in a pattern suggestive of atypical infection including nontuberculous mycobacterium. We will refer him to Dr. [name] for further evaluation and management.  Plan: With the CT results, we will hold nivolumab and will refer him to Dr. [name] for possible MAC infection. For now, he will require daily Lasix until otherwise instructed. We will plan to see him back in 1 month with a CBC, comprehensive metabolic panel, magnesium, TSH and B12 for repeat evaluation. The patient understands the plans discussed today and is in agreement with them.  They know to contact our office if he develops concerns prior to his next appointment.  I provided 15 minutes of face-to-face time  during this this encounter and > 50% was spent counseling as documented under my assessment and plan.    No orders of the defined types were placed in this encounter.      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 ([place])  01/24/2015 Cancer Staging   Staging form: Melanoma of the Skin, AJCC 8th Edition - Clinical stage from 01/24/2015: Stage IIB (cT3b, cN0, cM0) - Signed by [name], MD on 08/19/2021 Histopathologic type: Malignant melanoma, NOS (except juvenile melanoma [id number]) Stage prefix: Initial diagnosis Laterality: Right Lymph-vascular invasion (LVI): LVI not present (absent)/not identified Diagnostic confirmation: Positive histology Specimen type: Excision Staged by: Managing physician Mitotic count: 10 Mitotic unit: mm2 Clark's level: Level IV Tumor-infiltrating lymphocytes: Unknown Neurotropism: Absent Presence of extranodal extension: Absent Breslow depth (mm): 40 Ulceration of the epidermis: Yes Microsatellites: No Primary tumor regression: Unknown Sentinel lymph node biopsy performed: No Matted nodes: No Prognostic indicators: Wide excision negative Stage used in treatment planning: Yes National guidelines used in treatment planning: Yes Type of national guideline used in treatment planning: NCCN    07/18/2020 -  Chemotherapy   Patient is on Treatment Plan : MELANOMA Nivolumab + B12 q28d     07/29/2020 Initial Diagnosis   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  His original lesion was diagnosed in March 2016.  He underwent excision, but refused aggressive surgery.  The lesion was at least 4 mm thick with a Clark's  level 4, and a mitotic index 10/mm.  The margins were positive, but wide reexcision revealed no residual melanoma.  He had  recurrence within 2 months and the lesion had been steadily growing.  He initially had refused seeing a medical oncologist, but finally came to [place] for evaluation and treatment in October 2016.  He does have extensive comorbidities including COPD, diabetes, hypertension, coronary artery disease, history of myocardial infarction, and pacemaker with AICD.  PET revealed hypermetabolism in the right submandibular node, but no evidence of distant metastasis.  He was having significant pain and requiring regular hydrocodone doses.  He also had severe disfigurement and avoided going out in public because of the facial lesion.  He has been receiving palliative nivolumab since November 2016 and has had a good response with a decrease in the right facial tumor.  He has had associated hypomagnesemia and hypokalemia, so is on oral magnesium and potassium supplements. He has also intermittently required IV magnesium and potassium supplementation.  He has had mild chronic anemia, which has been stable. He was found to have B12 deficiency and continues B12 injections monthly.  CT head/neck in May 2017 revealed some nonspecific skin thickening in the right face in the area of his melanoma in the right submandibular lymph node had decreased in size, now measuring 6 x 12 mm, but still prominent when compared to the left submandibular node.  He had 15 cycles of nivolumab and relocated to continue to get the same treatment.  He had his 30th dose in February 2018 and then returned to the [place] area.     However, he was admitted to the hospital in early March 2018 for congestive heart failure associated with pleural and pericardial effusions.  He also had changes in the lung and it was unclear as to how much of this was fluid overload versus infection versus pneumonitis from the immunotherapy.  He had no leukocytosis, fever or purulent sputum.  The echocardiogram revealed severe diffuse hypokinesis with mild concentric hypertrophy  and markedly depressed ejection fraction of 20 to 25%, associated with moderate mitral regurgitation and moderate left atrial dilatation.  He improved with diuresis.  The pleural effusions were felt to be most likely secondary to congestive heart failure.  He was also placed on corticosteroids due to the possibility of side effects of immunotherapy, but these were subsequently discontinued.  CT chest in March 2018 revealed stable cardiomegaly and stable small pericardial effusion, but no evidence of immune mediated pneumonitis or metastatic disease.  The previously seen bilateral pleural effusions had resolved.  He returned to our care and nivolumab every 2 weeks was resumed in April.  CT chest was repeated in early May after 1 month back on therapy.  This was stable, without evidence of recurrent pleural effusions, immunotherapy toxicities, or evidence of metastatic disease.  He was switched to every 28-day nivolumab in August 2018.  He has had stable disease so has continued on nivolumab.  He remains on both furosemide and spironolactone.  CT imaging in October 2018 revealed a stable 5 mm level 1B cervical lymph node, as well as a stable 8 mm exophytic lesion of the right face.  CT chest did not reveal any evidence of metastatic disease. Borderline cardiomegaly and a small amount of pericardial fluid/thickening was seen, but not reaccumulation of the pleural effusions.  CT head did not reveal any evidence of intracranial metastasis.  There were chronic ischemic changes and evidence of old infarcts.  He has continued palliative nivolumab.  CT neck and chest in February 2019 were stable.  He was admitted to [facility] in May 2019 twice with severe hypoglycemia.  Repeat imaging in May did not reveal any progressive disease.  His nivolumab was held in May, but resumed at the end of June.  He did not have a dose in Jun 11, 2023.  Unfortunately, his wife passed away in 06/10/18 and she was his primary caregiver.  His  daughter has been caring for him since.  CT neck and chest in November 2019 did not reveal any evidence of recurrent or metastatic disease.  CT imaging in January 2020 did not reveal evidence of recurrence, so he has continued nivolumab every 4 weeks.  He was given mirtazapine for depression and weight loss the dose was increased to 15 mg.  CT scans in June 2020 remained stable with a small pericardial effusion and bilateral renal cysts. CT neck, chest and abdomen in November 2020 did not reveal any evidence of metastasis.     CT head, neck and chest from May and October 2021 were negative for evidence of metastatic disease.  He was then lost to follow-up from December 2021 to April 2022, at which time he resumed nivolumab and B12 every 4 weeks.   CT head, neck and chest in June of 2022 did not reveal any evidence of progressive disease.  INTERVAL HISTORY:  Kevin [name] is here for routine follow up prior to his next cycle or nivolumab. He has missed multiple treatments in the last 2 months. CT chest, abdomen and pelvis from February 3rd revealed no evidence of metastatic disease in the chest, abdomen or pelvis. New extensive tree in bud nodularity in the right lung with most significant involvement of the right lower lobe. Findings are most consistent with an infectious or inflammatory process in a pattern suggestive of atypical infection including nontuberculous mycobacterium. We will refer him to Dr. [name] for possible MAC infection. He was admitted at [facility] back in [place] due to flu and pneumonia. He denies shortness of breath except with exertion. He does have lower extremity edema, but has not been taking his Lasix. He will require this daily for now. Otherwise, he states that he is doing well. Hemoglobin has decreased from 12.7 to 10.4, and white count and platelets are normal. Chemistries are unremarkable. His  appetite is good, and he has gained 9 pounds since his last visit from fluid  retention.  He denies fever, chills or other signs of infection.  He denies nausea, vomiting, bowel issues, or abdominal pain.  He denies sore throat, cough, dyspnea, or chest pain.   REVIEW OF SYSTEMS:  Review of Systems  Constitutional: Negative.  Negative for appetite change, chills, fatigue, fever and unexpected weight change.  HENT:  Negative.    Eyes: Negative.   Respiratory: Negative.  Negative for chest tightness, cough, hemoptysis, shortness of breath and wheezing.   Cardiovascular:  Positive for leg swelling (lower extremity edema). Negative for chest pain and palpitations.  Gastrointestinal: Negative.  Negative for abdominal distention, abdominal pain, blood in stool, constipation, diarrhea, nausea and vomiting.  Endocrine: Negative.   Genitourinary: Negative.  Negative for difficulty urinating, dysuria, frequency and hematuria.   Musculoskeletal: Negative.  Negative for arthralgias, back pain, flank pain, gait problem and myalgias.  Skin: Negative.   Neurological: Negative.  Negative for dizziness, extremity weakness, gait problem, headaches, light-headedness, numbness, seizures and speech difficulty.  Hematological: Negative.   Psychiatric/Behavioral: Negative.  Negative for depression and sleep disturbance. The  patient is not nervous/anxious.   All other systems reviewed and are negative.  Sob w/exertion VITALS:  Blood pressure 138/65, pulse 79, temperature 97.9 F (36.6 C), resp. rate 16, height _0  (1.676 m), weight 164 lb 6.4 oz (74.6 kg), SpO2 96 %.  Wt Readings from Last 3 Encounters:  12/18/21 164 lb 6.4 oz (74.6 kg)  12/06/21 155 lb (70.3 kg)  10/25/21 147 lb (66.7 kg)    Body mass index is 26.53 kg/m.  Performance status (ECOG): 1 - Symptomatic but completely ambulatory  PHYSICAL EXAM:  Physical Exam Constitutional:      General: He is not in acute distress.    Appearance: Normal appearance. He is normal weight.  HENT:     Head: Normocephalic and  atraumatic.     Comments: Facial edema.  Eyes:     General: No scleral icterus.    Extraocular Movements: Extraocular movements intact.     Conjunctiva/sclera: Conjunctivae normal.     Pupils: Pupils are equal, round, and reactive to light.  Cardiovascular:     Rate and Rhythm: Normal rate and regular rhythm.     Pulses: Normal pulses.     Heart sounds: Normal heart sounds. No murmur heard.   No friction rub. No gallop.  Pulmonary:     Effort: Pulmonary effort is normal. No respiratory distress.     Breath sounds: Rales (bibasilar) present.  Abdominal:     General: Bowel sounds are normal. There is no distension.     Palpations: Abdomen is soft. There is no hepatomegaly, splenomegaly or mass.     Tenderness: There is no abdominal tenderness.  Musculoskeletal:        General: Normal range of motion.     Cervical back: Normal range of motion and neck supple.     Right lower leg: 1+ Edema present.     Left lower leg: 1+ Edema present.  Lymphadenopathy:     Cervical: No cervical adenopathy.  Skin:    General: Skin is warm and dry.     Comments: [place] of the right cheek which is stable.  Neurological:     General: No focal deficit present.     Mental Status: He is alert and oriented to person, place, and time. Mental status is at baseline.  Psychiatric:        Mood and Affect: Mood normal.        Behavior: Behavior normal.        Thought Content: Thought content normal.        Judgment: Judgment normal.    LABS:   CBC Latest Ref Rng & Units 12/13/2021 10/21/2021 10/20/2021  WBC - 4.2 5.3 6.8  Hemoglobin 13.5 - 17.5 10.4(A) 10.9(L) 10.7(L)  Hematocrit 41 - 53 32(A) 34.1(L) 33.7(L)  Platelets 150 - 399 258 249 250   CMP Latest Ref Rng & Units 12/13/2021 10/21/2021 10/20/2021  Glucose 70 - 99 mg/dL - 168(H) 213(H)  BUN 4 - _1 28(H)  Creatinine 0.6 - 1.3 0.9 1.33(H) 1.35(H)  Sodium 137 - 147 140 139 138  Potassium 3.4 - 5.3 4.2 3.9 4.0  Chloride 99 - 108 106 97(L)  101  CO2 13 - 22 29(A) 35(H) 32  Calcium 8.7 - 10.7 9.2 8.2(L) 8.3(L)  Total Protein 6.5 - 8.1 g/dL - - -  Total Bilirubin 0.3 - 1.2 mg/dL - - -  Alkaline Phos 25 - 125 66 - -  AST 14 - 40 21 - -  ALT  10 - 40 12 - -    STUDIES:  No results found.    EXAM: 12/13/2021 CT CHEST, ABDOMEN, AND PELVIS WITH CONTRAST   TECHNIQUE:  Multidetector CT imaging of the chest, abdomen and pelvis was  performed following the standard protocol during bolus  administration of intravenous contrast.   RADIATION DOSE REDUCTION: This exam was performed according to the  departmental dose-optimization program which includes automated  exposure control, adjustment of the mA and/or kV according to  patient size and/or use of iterative reconstruction technique.   CONTRAST: 100 cc of Isovue 370   COMPARISON: Multiple priors including most recent CT April 30, 2021   FINDINGS:  CT CHEST FINDINGS  Cardiovascular: Accessed right chest Port-A-Cath with tip in the  right atrium. Left chest cardiac AICD/defibrillator with lead in the  right atrium and right ventricle. Aortic atherosclerosis without  aneurysmal dilation. Similar cardiac enlargement with left  ventricular dilation. Coronary artery calcifications. Small volume  pericardial fluid is similar prior.  Mediastinum/Nodes: No supraclavicular adenopathy. No discrete  thyroid nodule. No pathologically enlarged mediastinal, hilar  axillary lymph nodes. Esophagus is grossly unremarkable.  Lungs/Pleura: Minimal scarring in the right middle lobe and lingula  similar to prior. New extensive tree in bud nodularity in the right  upper, middle and lower lobes with most significant involvement of  the right lower lobe. Lingular and right middle lobe scarring  appears similar prior. No pleural effusion or pneumothorax.  Musculoskeletal: Right acromioclavicular fixation wire. Multilevel  degenerative changes spine. No aggressive lytic or blastic lesion of   bone.   CT ABDOMEN PELVIS FINDINGS  Hepatobiliary: No suspicious hepatic lesion. Cholelithiasis without  findings of acute cholecystitis. No biliary ductal dilation.  Pancreas: No pancreatic ductal dilation or evidence of acute  inflammation.  Spleen: Normal in size without focal abnormality.  Adrenals/Urinary Tract: Similar nodular thickening of the adrenal  glands. No hydronephrosis. Partially exophytic 2.6 cm interpolar  renal lesion demonstrates Hounsfield units greater than that  expected for a simple cyst but is stable in size dating back to at  least December 27, 2007, consistent with a benign etiology. Stable  5.8 cm left upper pole renal cyst. Kidneys demonstrate symmetric  enhancement excretion of contrast material. Urinary bladder is  unremarkable for degree of distension.  Stomach/Bowel: Radiopaque enteric contrast material traverses the  rectum. Stomach is predominantly decompressed limiting evaluation.  No pathologic dilation of small or large bowel. No evidence of acute  bowel inflammation.  Vascular/Lymphatic: Aortic atherosclerosis without abdominal aortic  aneurysm. No pathologically enlarged abdominal or pelvic lymph  nodes.  Reproductive: Mild enlargement of the prostate gland.  Other: No significant abdominopelvic free fluid.  Musculoskeletal: Multilevel degenerative changes spine. Degenerative  change of the SI joints and hips. No aggressive lytic or blastic  lesion of bone.   IMPRESSION:  1. No evidence of metastatic disease in the chest, abdomen or  pelvis.  2. New extensive tree in bud nodularity in the right lung with most  significant involvement of the right lower lobe. Findings are most  consistent with an infectious or inflammatory process in a pattern  suggestive of atypical infection including nontuberculous  mycobacterium.  3. Cholelithiasis without findings of acute cholecystitis.  4. Aortic Atherosclerosis (ICD10-I70.0).   HISTORY:    Allergies: No Known Allergies  Current Medications: Current Outpatient Medications  Medication Sig Dispense Refill   albuterol (PROVENTIL) (2.5 MG/3ML) 0.083% nebulizer solution Take 2.5 mg by nebulization every 4 (four) hours as needed for shortness of  breath or wheezing.     amiodarone (PACERONE) 200 MG tablet Take 1 tablet (200 mg total) by mouth daily. 90 tablet 3   apixaban (ELIQUIS) 5 MG TABS tablet Take 1 tablet (5 mg total) by mouth 2 (two) times daily. 180 tablet 3   atorvastatin (LIPITOR) 80 MG tablet Take 1 tablet (80 mg total) by mouth at bedtime. 90 tablet 3   budesonide-formoterol (SYMBICORT) 80-4.5 MCG/ACT inhaler Inhale 1 puff into the lungs 2 (two) times daily.     furosemide (LASIX) 20 MG tablet Take 1 tablet (20 mg total) by mouth daily as needed for fluid or edema. 14 tablet 1   magnesium oxide (MAG-OX) 400 (241.3 Mg) MG tablet Take 400 mg by mouth 2 (two) times daily.     metFORMIN (GLUCOPHAGE) 1000 MG tablet Take 1,000 mg by mouth 2 (two) times daily.     metoprolol succinate (TOPROL-XL) 25 MG 24 hr tablet Take 1/2 tablet (12.5 mg total) by mouth daily. 15 tablet 0   sacubitril-valsartan (ENTRESTO) 24-26 MG Take 0.5 tablets by mouth 2 (two) times daily. 90 tablet 3   No current facility-administered medications for this visit.    I, Rita Ohara, am acting as scribe for [name], MD  I have reviewed this report as typed by the medical scribe, and it is complete and accurate.

## 2021-12-18 ENCOUNTER — Other Ambulatory Visit: Payer: Self-pay | Admitting: Oncology

## 2021-12-18 ENCOUNTER — Inpatient Hospital Stay: Payer: Medicare PPO | Attending: Oncology | Admitting: Oncology

## 2021-12-18 ENCOUNTER — Encounter: Payer: Self-pay | Admitting: Oncology

## 2021-12-18 ENCOUNTER — Other Ambulatory Visit: Payer: Self-pay

## 2021-12-18 VITALS — BP 138/65 | HR 79 | Temp 97.9°F | Resp 16 | Ht 66.0 in | Wt 164.4 lb

## 2021-12-18 DIAGNOSIS — D519 Vitamin B12 deficiency anemia, unspecified: Secondary | ICD-10-CM

## 2021-12-18 DIAGNOSIS — C4339 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: Secondary | ICD-10-CM

## 2021-12-20 ENCOUNTER — Other Ambulatory Visit: Payer: Self-pay | Admitting: Student

## 2021-12-20 ENCOUNTER — Other Ambulatory Visit: Payer: Self-pay

## 2021-12-20 MED ORDER — METOPROLOL SUCCINATE ER 25 MG PO TB24: 12.5000 mg | ORAL_TABLET | Freq: Every day | ORAL | 3 refills | Status: DC

## 2021-12-25 ENCOUNTER — Encounter: Payer: Self-pay | Admitting: Oncology

## 2022-01-01 DIAGNOSIS — R918 Other nonspecific abnormal finding of lung field: Secondary | ICD-10-CM | POA: Diagnosis not present

## 2022-01-01 DIAGNOSIS — F17210 Nicotine dependence, cigarettes, uncomplicated: Secondary | ICD-10-CM | POA: Diagnosis not present

## 2022-01-01 DIAGNOSIS — J9611 Chronic respiratory failure with hypoxia: Secondary | ICD-10-CM | POA: Diagnosis not present

## 2022-01-01 DIAGNOSIS — J449 Chronic obstructive pulmonary disease, unspecified: Secondary | ICD-10-CM | POA: Diagnosis not present

## 2022-01-02 ENCOUNTER — Ambulatory Visit: Payer: Medicare PPO

## 2022-01-02 ENCOUNTER — Other Ambulatory Visit: Payer: Self-pay

## 2022-01-02 ENCOUNTER — Other Ambulatory Visit: Payer: Medicare PPO | Admitting: Student

## 2022-01-02 DIAGNOSIS — I5022 Chronic systolic (congestive) heart failure: Secondary | ICD-10-CM

## 2022-01-09 NOTE — Progress Notes (Incomplete)
Kevin [name]  [address] [place],  [place]  [address] [phone]  Clinic Day:  01/09/2022  Referring physician: [name], PA-C  This document serves as a record of services personally performed by [name], MD. It was created on their behalf by [name],[name], a trained medical scribe. The creation of this record is based on the scribe's personal observations and the provider's statements to them.  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 CT neck and chest in June did not reveal any evidence of progressive disease. CT imaging from February 2023 remains without evidence of metastatic disease. His disease remains under control and so we will put treatment on hold until his cardiorespiratory issues are addressed.  Macrocytic anemia Macrocytic anemia, despite monthly B12 injections. Since he has missed some doses, I will recheck a B12 level when he returns.  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 Findings are most consistent with an infectious or inflammatory process in a pattern suggestive of atypical infection including nontuberculous mycobacterium. We will refer him to Dr. [name] for further evaluation and management.  Plan: With the CT results, we will hold nivolumab and will refer him to Dr. [name] for possible MAC infection. For now, he will require daily Lasix until otherwise instructed. We will plan to see him back in 1 month with a CBC, comprehensive metabolic panel, magnesium, TSH and B12 for repeat evaluation. The patient understands the plans discussed today and is in agreement with them.  They know to contact our office if he develops concerns prior to his next appointment.  I provided 15 minutes of face-to-face time  during this this encounter and > 50% was spent counseling as documented under my assessment and plan.    No orders of the defined types were placed in this encounter.      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 ([place])  01/24/2015 Cancer Staging   Staging form: Melanoma of the Skin, AJCC 8th Edition - Clinical stage from 01/24/2015: Stage IIB (cT3b, cN0, cM0) - Signed by [name], MD on 08/19/2021 Histopathologic type: Malignant melanoma, NOS (except juvenile melanoma [id number]) Stage prefix: Initial diagnosis Laterality: Right Lymph-vascular invasion (LVI): LVI not present (absent)/not identified Diagnostic confirmation: Positive histology Specimen type: Excision Staged by: Managing physician Mitotic count: 10 Mitotic unit: mm2 Clark's level: Level IV Tumor-infiltrating lymphocytes: Unknown Neurotropism: Absent Presence of extranodal extension: Absent Breslow depth (mm): 40 Ulceration of the epidermis: Yes Microsatellites: No Primary tumor regression: Unknown Sentinel lymph node biopsy performed: No Matted nodes: No Prognostic indicators: Wide excision negative Stage used in treatment planning: Yes National guidelines used in treatment planning: Yes Type of national guideline used in treatment planning: NCCN    07/18/2020 -  Chemotherapy   Patient is on Treatment Plan : MELANOMA Nivolumab + B12 q28d     07/29/2020 Initial Diagnosis   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  His original lesion was diagnosed in March 2016.  He underwent excision, but refused aggressive surgery.  The lesion was at least 4 mm thick with a Clark's  level 4, and a mitotic index 10/mm.  The margins were positive, but wide reexcision revealed no residual melanoma.  He had  recurrence within 2 months and the lesion had been steadily growing.  He initially had refused seeing a medical oncologist, but finally came to [place] for evaluation and treatment in October 2016.  He does have extensive comorbidities including COPD, diabetes, hypertension, coronary artery disease, history of myocardial infarction, and pacemaker with AICD.  PET revealed hypermetabolism in the right submandibular node, but no evidence of distant metastasis.  He was having significant pain and requiring regular hydrocodone doses.  He also had severe disfigurement and avoided going out in public because of the facial lesion.  He has been receiving palliative nivolumab since November 2016 and has had a good response with a decrease in the right facial tumor.  He has had associated hypomagnesemia and hypokalemia, so is on oral magnesium and potassium supplements. He has also intermittently required IV magnesium and potassium supplementation.  He has had mild chronic anemia, which has been stable. He was found to have B12 deficiency and continues B12 injections monthly.  CT head/neck in May 2017 revealed some nonspecific skin thickening in the right face in the area of his melanoma in the right submandibular lymph node had decreased in size, now measuring 6 x 12 mm, but still prominent when compared to the left submandibular node.  He had 15 cycles of nivolumab and relocated to continue to get the same treatment.  He had his 30th dose in February 2018 and then returned to the [place] area.     However, he was admitted to the hospital in early March 2018 for congestive heart failure associated with pleural and pericardial effusions.  He also had changes in the lung and it was unclear as to how much of this was fluid overload versus infection versus pneumonitis from the immunotherapy.  He had no leukocytosis, fever or purulent sputum.  The echocardiogram revealed severe diffuse hypokinesis with mild concentric hypertrophy  and markedly depressed ejection fraction of 20 to 25%, associated with moderate mitral regurgitation and moderate left atrial dilatation.  He improved with diuresis.  The pleural effusions were felt to be most likely secondary to congestive heart failure.  He was also placed on corticosteroids due to the possibility of side effects of immunotherapy, but these were subsequently discontinued.  CT chest in March 2018 revealed stable cardiomegaly and stable small pericardial effusion, but no evidence of immune mediated pneumonitis or metastatic disease.  The previously seen bilateral pleural effusions had resolved.  He returned to our care and nivolumab every 2 weeks was resumed in April.  CT chest was repeated in early May after 1 month back on therapy.  This was stable, without evidence of recurrent pleural effusions, immunotherapy toxicities, or evidence of metastatic disease.  He was switched to every 28-day nivolumab in August 2018.  He has had stable disease so has continued on nivolumab.  He remains on both furosemide and spironolactone.  CT imaging in October 2018 revealed a stable 5 mm level 1B cervical lymph node, as well as a stable 8 mm exophytic lesion of the right face.  CT chest did not reveal any evidence of metastatic disease. Borderline cardiomegaly and a small amount of pericardial fluid/thickening was seen, but not reaccumulation of the pleural effusions.  CT head did not reveal any evidence of intracranial metastasis.  There were chronic ischemic changes and evidence of old infarcts.  He has continued palliative nivolumab.  CT neck and chest in February 2019 were stable.  He was admitted to [facility] in May 2019 twice with severe hypoglycemia.  Repeat imaging in May did not reveal any progressive disease.  His nivolumab was held in May, but resumed at the end of June.  He did not have a dose in 06/16/2023.  Unfortunately, his wife passed away in Jun 15, 2018 and she was his primary caregiver.  His  daughter has been caring for him since.  CT neck and chest in November 2019 did not reveal any evidence of recurrent or metastatic disease.  CT imaging in January 2020 did not reveal evidence of recurrence, so he has continued nivolumab every 4 weeks.  He was given mirtazapine for depression and weight loss the dose was increased to 15 mg.  CT scans in June 2020 remained stable with a small pericardial effusion and bilateral renal cysts. CT neck, chest and abdomen in November 2020 did not reveal any evidence of metastasis.     CT head, neck and chest from May and October 2021 were negative for evidence of metastatic disease.  He was then lost to follow-up from December 2021 to April 2022, at which time he resumed nivolumab and B12 every 4 weeks.   CT head, neck and chest in June of 2022 did not reveal any evidence of progressive disease.  INTERVAL HISTORY:  Kevin [name] is here for routine follow up prior to his next cycle or nivolumab. He has missed multiple treatments in the last 2 months. CT chest, abdomen and pelvis from February 3rd revealed no evidence of metastatic disease in the chest, abdomen or pelvis. New extensive tree in bud nodularity in the right lung with most significant involvement of the right lower lobe. Findings are most consistent with an infectious or inflammatory process in a pattern suggestive of atypical infection including nontuberculous mycobacterium. We will refer him to Dr. [name] for possible MAC infection. He was admitted at [facility] back in [place] due to flu and pneumonia. He denies shortness of breath except with exertion. He does have lower extremity edema, but has not been taking his Lasix. He will require this daily for now. Otherwise, he states that he is doing well. Hemoglobin has decreased from 12.7 to 10.4, and white count and platelets are normal. Chemistries are unremarkable. His  appetite is good, and he has gained 9 pounds since his last visit from fluid  retention.  He denies fever, chills or other signs of infection.  He denies nausea, vomiting, bowel issues, or abdominal pain.  He denies sore throat, cough, dyspnea, or chest pain.  Dray is here for follow up prior to his next nivolumab.   His  appetite is good, and he has gained/lost _ pounds since his last visit.  He denies fever, chills or other signs of infection.  He denies nausea, vomiting, bowel issues, or abdominal pain.  He denies sore throat, cough, dyspnea, or chest pain.   REVIEW OF SYSTEMS:  Review of Systems  Constitutional: Negative.  Negative for appetite change, chills, fatigue, fever and unexpected weight change.  HENT:  Negative.    Eyes: Negative.   Respiratory: Negative.  Negative for chest tightness, cough, hemoptysis, shortness of breath and wheezing.   Cardiovascular: Negative.  Negative for chest pain, leg swelling and palpitations.  Gastrointestinal: Negative.  Negative for abdominal distention, abdominal pain, blood in stool, constipation, diarrhea, nausea and vomiting.  Endocrine: Negative.   Genitourinary: Negative.  Negative for difficulty urinating, dysuria, frequency  and hematuria.   Musculoskeletal: Negative.  Negative for arthralgias, back pain, flank pain, gait problem and myalgias.  Skin: Negative.   Neurological: Negative.  Negative for dizziness, extremity weakness, gait problem, headaches, light-headedness, numbness, seizures and speech difficulty.  Hematological: Negative.   Psychiatric/Behavioral: Negative.  Negative for depression and sleep disturbance. The patient is not nervous/anxious.   All other systems reviewed and are negative.   VITALS:  There were no vitals taken for this visit.  Wt Readings from Last 3 Encounters:  12/18/21 164 lb 6.4 oz (74.6 kg)  12/06/21 155 lb (70.3 kg)  10/25/21 147 lb (66.7 kg)    There is no height or weight on file to calculate BMI.  Performance status (ECOG): 1 - Symptomatic but completely  ambulatory  PHYSICAL EXAM:  Physical Exam Constitutional:      General: He is not in acute distress.    Appearance: Normal appearance. He is normal weight.  HENT:     Head: Normocephalic and atraumatic.  Eyes:     General: No scleral icterus.    Extraocular Movements: Extraocular movements intact.     Conjunctiva/sclera: Conjunctivae normal.     Pupils: Pupils are equal, round, and reactive to light.  Cardiovascular:     Rate and Rhythm: Normal rate and regular rhythm.     Pulses: Normal pulses.     Heart sounds: Normal heart sounds. No murmur heard.   No friction rub. No gallop.  Pulmonary:     Effort: Pulmonary effort is normal. No respiratory distress.     Breath sounds: Normal breath sounds.  Abdominal:     General: Bowel sounds are normal. There is no distension.     Palpations: Abdomen is soft. There is no hepatomegaly, splenomegaly or mass.     Tenderness: There is no abdominal tenderness.  Musculoskeletal:        General: Normal range of motion.     Cervical back: Normal range of motion and neck supple.     Right lower leg: No edema.     Left lower leg: No edema.  Lymphadenopathy:     Cervical: No cervical adenopathy.  Skin:    General: Skin is warm and dry.  Neurological:     General: No focal deficit present.     Mental Status: He is alert and oriented to person, place, and time. Mental status is at baseline.  Psychiatric:        Mood and Affect: Mood normal.        Behavior: Behavior normal.        Thought Content: Thought content normal.        Judgment: Judgment normal.    LABS:   CBC Latest Ref Rng & Units 12/13/2021 10/21/2021 10/20/2021  WBC - 4.2 5.3 6.8  Hemoglobin 13.5 - 17.5 10.4(A) 10.9(L) 10.7(L)  Hematocrit 41 - 53 32(A) 34.1(L) 33.7(L)  Platelets 150 - 399 258 249 250   CMP Latest Ref Rng & Units 12/13/2021 10/21/2021 10/20/2021  Glucose 70 - 99 mg/dL - 168(H) 213(H)  BUN 4 - _0 28(H)  Creatinine 0.6 - 1.3 0.9 1.33(H) 1.35(H)  Sodium  137 - 147 140 139 138  Potassium 3.4 - 5.3 4.2 3.9 4.0  Chloride 99 - 108 106 97(L) 101  CO2 13 - 22 29(A) 35(H) 32  Calcium 8.7 - 10.7 9.2 8.2(L) 8.3(L)  Total Protein 6.5 - 8.1 g/dL - - -  Total Bilirubin 0.3 - 1.2 mg/dL - - -  Alkaline  Phos 25 - 125 66 - -  AST 14 - 40 21 - -  ALT 10 - 40 12 - -    STUDIES:  PCV ECHOCARDIOGRAM COMPLETE  Result Date: 01/04/2022 Echocardiogram 01/02/2022: Left ventricle cavity is moderately dilated. Normal left ventricular wall thickness. Abnormal septal wall motion due to right ventricle pacemaker. Severe global hypokinesis. LVEF <20%. No evidence of LV thrombus on this noncontrast study. Doppler evidence of grade II (pseudonormal) diastolic dysfunction, elevated LAP. Calculated EF 29%. Left atrial cavity is mildly dilated. Structurally normal trileaflet aortic valve.  Moderate (Grade II) aortic regurgitation. Structurally normal mitral valve.  Moderate to severe mitral regurgitation. Structurally normal tricuspid valve.  Mild to moderate tricuspid regurgitation. Estimated pulmonary artery systolic pressure 55 mmHg. Small pericardial effusion most adjacent to right atrium. There is no hemodynamic significance. Previous study on 10/07/2021 reported mild MR. No other significant change noted.      HISTORY:   Allergies: No Known Allergies  Current Medications: Current Outpatient Medications  Medication Sig Dispense Refill   albuterol (PROVENTIL) (2.5 MG/3ML) 0.083% nebulizer solution Take 2.5 mg by nebulization every 4 (four) hours as needed for shortness of breath or wheezing.     amiodarone (PACERONE) 200 MG tablet Take 1 tablet (200 mg total) by mouth daily. 90 tablet 3   apixaban (ELIQUIS) 5 MG TABS tablet Take 1 tablet (5 mg total) by mouth 2 (two) times daily. 180 tablet 3   atorvastatin (LIPITOR) 80 MG tablet Take 1 tablet (80 mg total) by mouth at bedtime. 90 tablet 3   budesonide-formoterol (SYMBICORT) 80-4.5 MCG/ACT inhaler Inhale 1 puff into  the lungs 2 (two) times daily.     furosemide (LASIX) 20 MG tablet Take 1 tablet (20 mg total) by mouth daily as needed for fluid or edema. 14 tablet 1   magnesium oxide (MAG-OX) 400 (241.3 Mg) MG tablet Take 400 mg by mouth 2 (two) times daily.     metFORMIN (GLUCOPHAGE) 1000 MG tablet Take 1,000 mg by mouth 2 (two) times daily.     metoprolol succinate (TOPROL-XL) 25 MG 24 hr tablet Take 1/2 tablet (12.5 mg total) by mouth daily. 15 tablet 3   ondansetron (ZOFRAN-ODT) 4 MG disintegrating tablet Take 1 tablet by mouth daily as needed.     sacubitril-valsartan (ENTRESTO) 24-26 MG Take 0.5 tablets by mouth 2 (two) times daily. 90 tablet 3   No current facility-administered medications for this visit.    I, Rita Ohara, am acting as scribe for [name], MD  I have reviewed this report as typed by the medical scribe, and it is complete and accurate.

## 2022-01-14 DIAGNOSIS — I5021 Acute systolic (congestive) heart failure: Secondary | ICD-10-CM | POA: Diagnosis not present

## 2022-01-15 ENCOUNTER — Other Ambulatory Visit: Payer: Medicare PPO

## 2022-01-15 ENCOUNTER — Ambulatory Visit: Payer: Medicare PPO | Admitting: Oncology

## 2022-01-15 DIAGNOSIS — E785 Hyperlipidemia, unspecified: Secondary | ICD-10-CM | POA: Diagnosis not present

## 2022-01-15 DIAGNOSIS — I10 Essential (primary) hypertension: Secondary | ICD-10-CM | POA: Diagnosis not present

## 2022-01-15 DIAGNOSIS — J449 Chronic obstructive pulmonary disease, unspecified: Secondary | ICD-10-CM | POA: Diagnosis not present

## 2022-01-15 DIAGNOSIS — I4891 Unspecified atrial fibrillation: Secondary | ICD-10-CM | POA: Diagnosis not present

## 2022-01-15 DIAGNOSIS — I471 Supraventricular tachycardia: Secondary | ICD-10-CM | POA: Diagnosis not present

## 2022-01-15 DIAGNOSIS — I5022 Chronic systolic (congestive) heart failure: Secondary | ICD-10-CM | POA: Diagnosis not present

## 2022-01-15 DIAGNOSIS — C779 Secondary and unspecified malignant neoplasm of lymph node, unspecified: Secondary | ICD-10-CM | POA: Diagnosis not present

## 2022-01-15 DIAGNOSIS — E1169 Type 2 diabetes mellitus with other specified complication: Secondary | ICD-10-CM | POA: Diagnosis not present

## 2022-01-15 DIAGNOSIS — E7800 Pure hypercholesterolemia, unspecified: Secondary | ICD-10-CM | POA: Diagnosis not present

## 2022-01-21 ENCOUNTER — Telehealth: Payer: Self-pay

## 2022-01-21 NOTE — Telephone Encounter (Signed)
Dr Hinton Rao asked me to call and check on pt, as he missed his last appt on 01/15/2022. I spoke with [name], pt's daughter. She was unaware that he had missed an appt. She states he is doing good. He went out yesterday and saw his sister. He is eating well. He has recently seen his PCP, Dr Lisbeth Ply. She did labs and told them everything looked good. His swelling is down. Pt does not want to have procedure that has been recommended (Dr [name] visit). She ask that I call back @ 3pm to schedule appt. ? ?1st appt they could make it here is February 12, 2022. ?

## 2022-01-25 IMAGING — CR DG CHEST 2V
3 series · 3 of 3 positions shown · non-contrast
Comparison: Radiographs 01/04/2018.  CT 04/30/2021.

CLINICAL DATA: Shortness of breath with cough and congestion for
several days.

EXAM:
CHEST - 2 VIEW

[chest pa (1 of 2)]
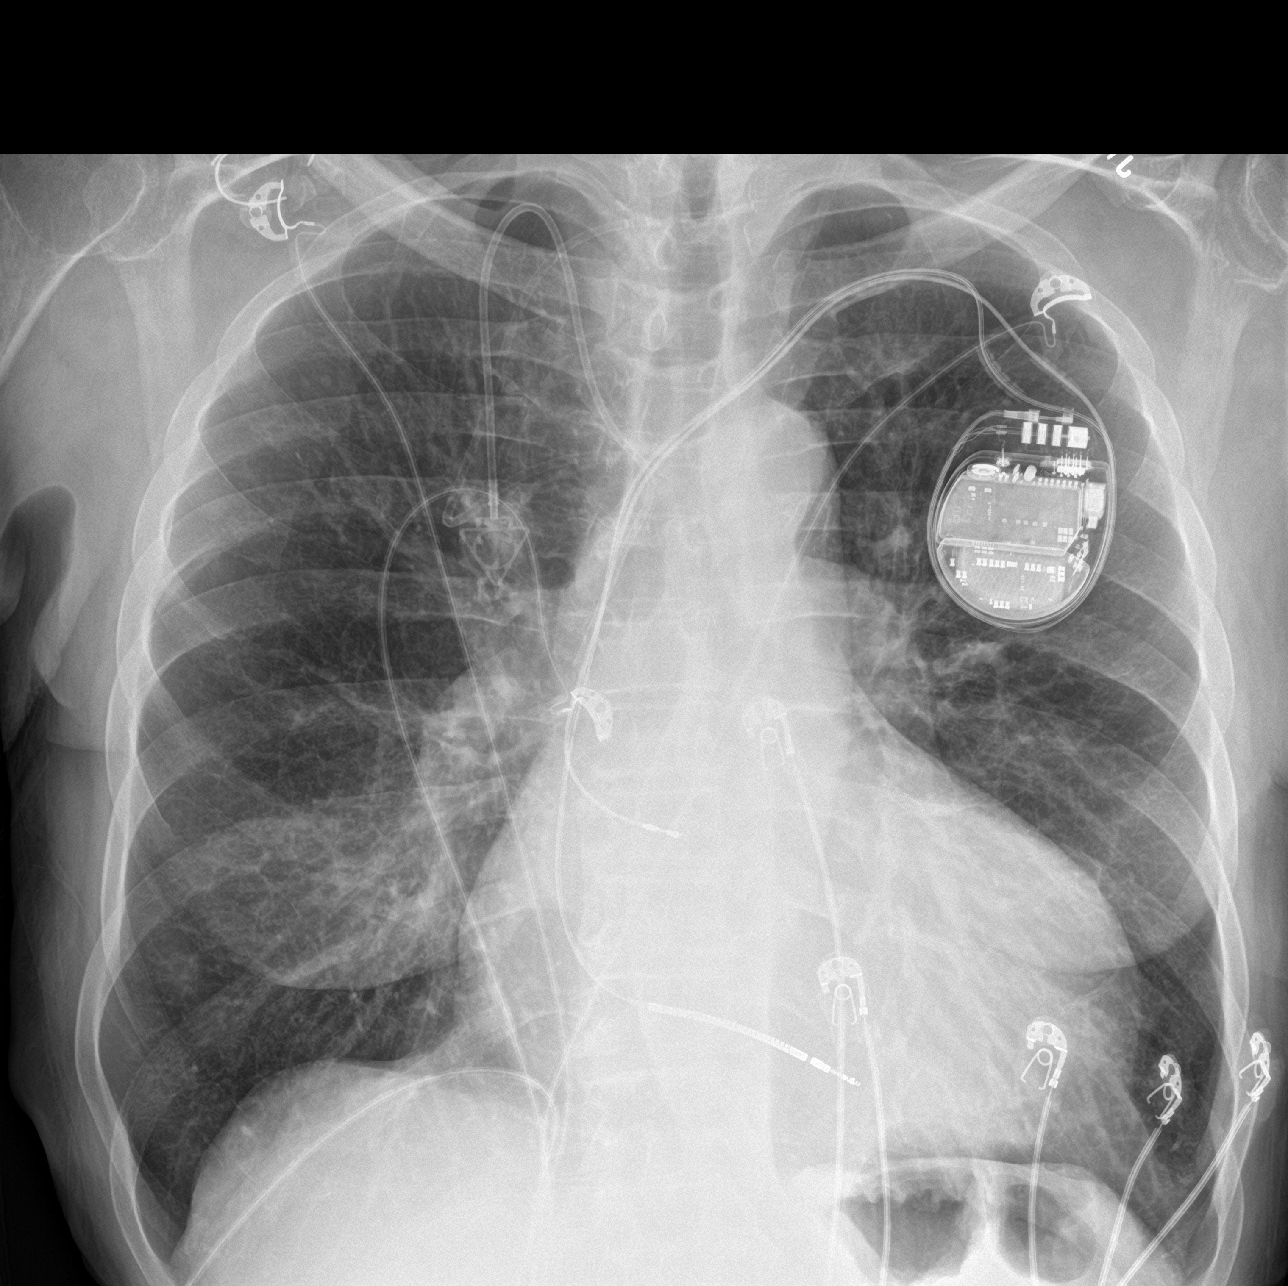

[chest lat]
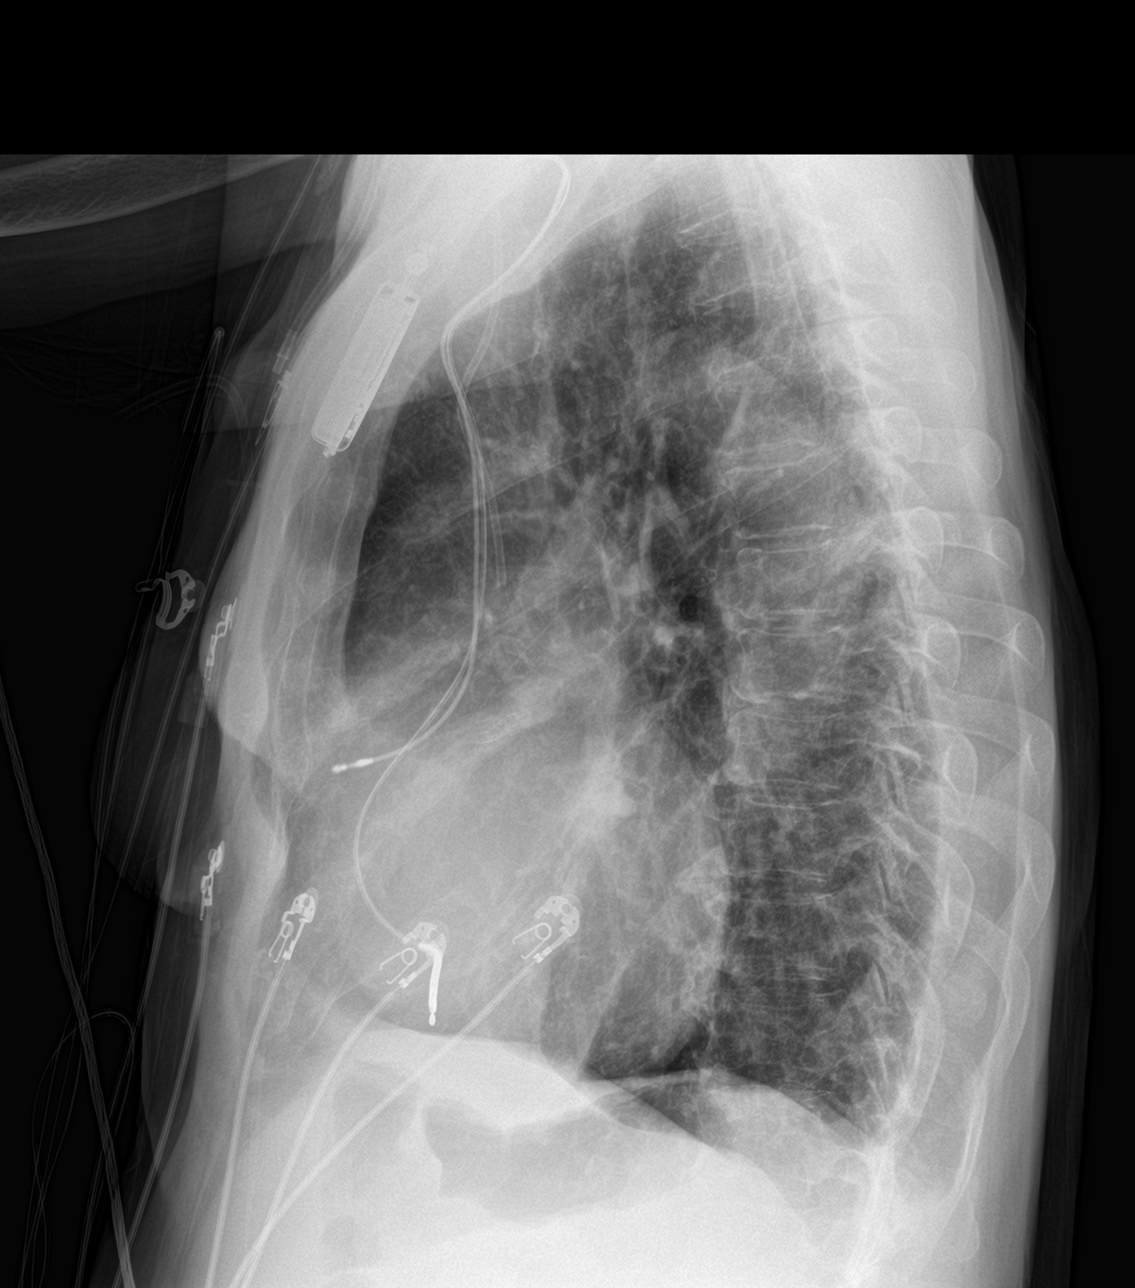

[chest pa (2 of 2)]
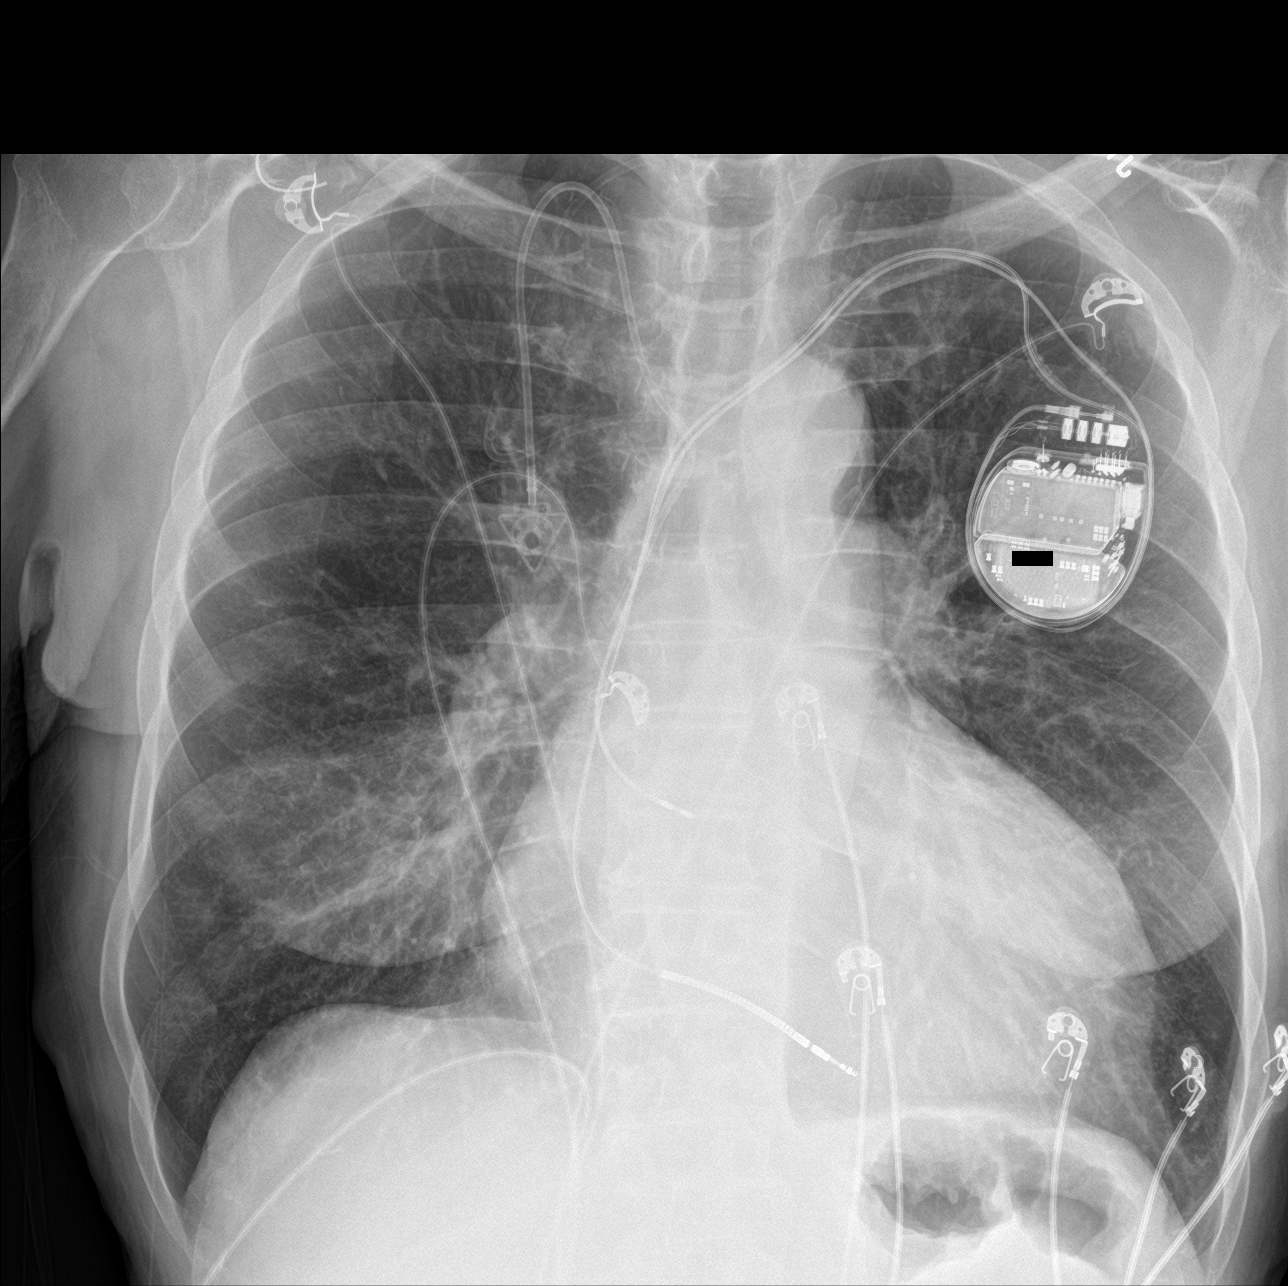

[3 of 3 positions shown; findings below may reference images not displayed]

FINDINGS: Right IJ Port-A-Cath appears unchanged, extending to the level of
the mid SVC. Left subclavian pacemaker leads appear unchanged.
Mildly progressive cardiomegaly from 5115. The pulmonary vascularity
appears normal, and there is no edema, confluent airspace opacity,
pleural effusion or pneumothorax. No acute osseous findings are
evident. Postsurgical changes are present at the right shoulder with
a chronically fractured cerclage wire.
IMPRESSION: Mildly progressive cardiomegaly without evidence of acute
cardiopulmonary process.

## 2022-01-31 ENCOUNTER — Ambulatory Visit: Payer: Medicare PPO | Admitting: Student

## 2022-01-31 ENCOUNTER — Other Ambulatory Visit: Payer: Self-pay

## 2022-01-31 ENCOUNTER — Encounter: Payer: Self-pay | Admitting: Student

## 2022-01-31 VITALS — BP 138/65 | HR 67 | Temp 98.1°F | Resp 17 | Ht 66.0 in | Wt 162.4 lb

## 2022-01-31 DIAGNOSIS — R0609 Other forms of dyspnea: Secondary | ICD-10-CM | POA: Diagnosis not present

## 2022-01-31 DIAGNOSIS — J449 Chronic obstructive pulmonary disease, unspecified: Secondary | ICD-10-CM

## 2022-01-31 DIAGNOSIS — I5022 Chronic systolic (congestive) heart failure: Secondary | ICD-10-CM

## 2022-01-31 DIAGNOSIS — R300 Dysuria: Secondary | ICD-10-CM | POA: Diagnosis not present

## 2022-01-31 DIAGNOSIS — I4819 Other persistent atrial fibrillation: Secondary | ICD-10-CM

## 2022-01-31 NOTE — Progress Notes (Signed)
? ?Primary Physician/Referring:  [name], PA-C ? ?Patient ID: Kevin [name], male    DOB: 08/02/1941, 81 y.o.   MRN: [id number] ? ?Chief Complaint  ?Patient presents with  ? hf  ? Coronary Artery Disease  ? Atrial Fibrillation  ?  8 weeks  ? ?HPI:   ? ?Kevin [name]  is a 81 y.o. Caucasian male  with hypertension, type 2 DM, COPD, nicotine dependence, CAD s/p ?MI/PCI (2015), ischemic cardiomyopathy with previously known LVEF 25%, s/p dual chamber ICD ([employer]), h/o atrial tachycardia, melanoma-currently on monthly chemotherapy.  ? ?Patient patient was admitted 10/06/2021 with atrial fibrillation with RVR and acute on chronic HFrEF, patient was started on guideline directed medical therapy and diuresed, subsequently discharged 10/11/2021.  Patient readmitted 10/17/2021 - 10/21/2021 with aspiration pneumonia, COPD exacerbation, and AKI, therefore Jardiance and digoxin were discontinued at that time. ? ?Patient presents for 8-week follow-up.  Last office visit patient was not taking metoprolol as prescribed, therefore restarted Toprol XL 12.5 mg p.o. daily.  At last office visit patient weight had trended up and he was experiencing leg edema, therefore advised him to take Lasix 20 mg p.o. daily for the next 5 days.  Since last office visit patient has undergone repeat echocardiogram which revealed moderate to severe mitral regurgitation which was previously mild, LVEF <20%. ? ?Patient presents with his daughter present at bedside.  Overall patient's primary complaint is continued generalized fatigue.  His bilateral leg edema remained stable.  He also continues to have dyspnea on exertion, which is stable.  Patient also reports 2 weeks of dysuria which seems to be causing him significant concern. ? ?Patient is enrolled in remote patient monitoring with our office.  Blood pressure is overall well controlled, however he does continue to have episodes of hypotension. ? ?Past Medical History:  ?Diagnosis Date   ? Cancer [facility])   ? CHF (congestive heart failure) ([place])   ? COPD (chronic obstructive pulmonary disease) ([place])   ? Diabetes mellitus without complication ([place])   ? History of myocardial infarction   ? Hyperlipidemia   ? Hypertension   ? 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 ?Past Surgical History:  ?Procedure Laterality Date  ? APPENDECTOMY    ? CARDIAC CATHETERIZATION    ? CORONARY ANGIOPLASTY    ? PACEMAKER INSERTION    ? RIGHT/LEFT HEART CATH AND CORONARY ANGIOGRAPHY N/A 10/09/2021  ? Procedure: RIGHT/LEFT HEART CATH AND CORONARY ANGIOGRAPHY;  Surgeon: Nigel Mormon, MD;  Location: [facility];  Service: Cardiovascular;  Laterality: N/A;  ? ?Family History  ?Problem Relation Age of Onset  ? Breast cancer Mother   ? Diabetes Mellitus II Father   ? Ovarian cancer Sister   ? Cancer Brother   ? Cancer Brother   ? Cancer Maternal Uncle   ? Breast cancer Niece   ?  ?Social History  ? ?Tobacco Use  ? Smoking status: Former  ?  Packs/day: 0.50  ?  Years: 60.00  ?  Pack years: 30.00  ?  Types: Cigarettes  ?  Quit date: 09/2021  ?  Years since quitting: 0.3  ? Smokeless tobacco: Never  ?Substance Use Topics  ? Alcohol use: Not Currently  ? ?Marital Status: Married  ? ?ROS  ?Review of Systems  ?Constitutional: Positive for malaise/fatigue. Negative for weight gain.  ?  Cardiovascular:  Positive for dyspnea on exertion (uses supplemental oxygen) and leg swelling (stable). Negative for chest pain, claudication, near-syncope, orthopnea, palpitations, paroxysmal nocturnal dyspnea and syncope.  ?Genitourinary:  Positive for dysuria.  ?Neurological:  Negative for dizziness.  ? ?Objective  ?Blood pressure 138/65, pulse 67, temperature 98.1 ?F (36.7 ?C), temperature source Temporal, resp. rate 17, height '5\' 6"'$  (1.676 m), weight 162 lb 6.4 oz (73.7 kg), SpO2 93 %.  ? ?  01/31/2022  ? 11:49  AM 12/18/2021  ?  3:07 PM 12/06/2021  ? 12:32 PM  ?Vitals with BMI  ?Height '5\' 6"'$  '5\' 6"'$  '5\' 6"'$   ?Weight 162 lbs 6 oz 164 lbs 6 oz 155 lbs  ?BMI 26.22 26.55 25.03  ?Systolic 188 416 606  ?Diastolic 65 65 57  ?Pulse 67 79 77  ?  ? ? Physical Exam ?Vitals reviewed.  ?Constitutional:   ?   Appearance: He is ill-appearing.  ?   Comments: Frail  ?Cardiovascular:  ?   Rate and Rhythm: Normal rate and regular rhythm.  ?   Pulses: Intact distal pulses. Decreased pulses.  ?   Heart sounds: S1 normal and S2 normal. Murmur heard.  ?Low-pitched rumbling diastolic murmur is present at the apex.  ?  No gallop.  ?Pulmonary:  ?   Effort: Pulmonary effort is normal. No respiratory distress.  ?   Breath sounds: No wheezing, rhonchi or rales.  ?   Comments: Supplemental oxygen: 3 L/min via nasal cannula ?Musculoskeletal:  ?   Right lower leg: Edema (1+) present.  ?   Left lower leg: Edema (1+) present.  ?Neurological:  ?   Mental Status: He is alert.  ? ? ?Laboratory examination:  ? ?Recent Labs  ?  10/19/21 ?0132 10/20/21 ?3016 10/21/21 ?0036 12/13/21 ?0000  ?NA 135 138 139 140  ?K 4.2 4.0 3.9 4.2  ?CL 98 101 97* 106  ?CO2 30 32 35* 29*  ?GLUCOSE 190* 213* 168*  --   ?BUN 43* 28* 23 13  ?CREATININE 1.39* 1.35* 1.33* 0.9  ?CALCIUM 8.2* 8.3* 8.2* 9.2  ?GFRNONAA 52* 53* 54*  --   ? ?CrCl cannot be calculated (Patient's most recent lab result is older than the maximum 21 days allowed.).  ? ?  Latest Ref Rng & Units 12/13/2021  ? 12:00 AM 10/21/2021  ? 12:36 AM 10/20/2021  ? 12:29 AM  ?CMP  ?Glucose 70 - 99 mg/dL  168   213    ?BUN 4 - '21 13   23   28    '$ ?Creatinine 0.6 - 1.3 0.9   1.33   1.35    ?Sodium 137 - 147 140   139   138    ?Potassium 3.4 - 5.3 4.2   3.9   4.0    ?Chloride 99 - 108 106   97   101    ?CO2 13 - 22 29   35   32    ?Calcium 8.7 - 10.7 9.2   8.2   8.3    ?Alkaline Phos 25 - 125 66      ?AST 14 - 40 21      ?ALT 10 - 40 12      ? ? ?  Latest Ref Rng & Units 12/13/2021  ? 12:00 AM 10/21/2021  ? 12:36 AM 10/20/2021  ? 12:29 AM   ?CBC  ?WBC  4.2   5.3   6.8    ?Hemoglobin 13.5 - 17.5 10.4   10.9  10.7    ?Hematocrit 41 - 53 32   34.1   33.7    ?Platelets 150 - 399 258   249   250    ? ? ?Lipid Panel ?No results for input(s): CHOL, TRIG, LDLCALC, VLDL, HDL, CHOLHDL, LDLDIRECT in the last 8760 hours. ? ?HEMOGLOBIN A1C ?Lab Results  ?Component Value Date  ? HGBA1C 6.7 (H) 10/07/2021  ? MPG 146 10/07/2021  ? ?TSH ?Recent Labs  ?  07/16/21 ?1456 08/15/21 ?1121 10/06/21 ?2026  ?TSH 1.541 2.498 1.736  ? ? ?External labs:  ?10/31/2021: ?BUN 17, creatinine 1.09, GFR 69, sodium 142, potassium 4.7 ?BNP 396.8 ?Hemoglobin 10.2, hematocrit 31.8, MCV 94, platelet 177 ? ?Allergies  ?No Known Allergies  ? ?Medications Prior to Visit:  ? ?Outpatient Medications Prior to Visit  ?Medication Sig Dispense Refill  ? albuterol (PROVENTIL) (2.5 MG/3ML) 0.083% nebulizer solution Take 2.5 mg by nebulization every 4 (four) hours as needed for shortness of breath or wheezing.    ? amiodarone (PACERONE) 200 MG tablet Take 1 tablet (200 mg total) by mouth daily. 90 tablet 3  ? apixaban (ELIQUIS) 5 MG TABS tablet Take 1 tablet (5 mg total) by mouth 2 (two) times daily. 180 tablet 3  ? atorvastatin (LIPITOR) 80 MG tablet Take 1 tablet (80 mg total) by mouth at bedtime. 90 tablet 3  ? budesonide-formoterol (SYMBICORT) 80-4.5 MCG/ACT inhaler Inhale 1 puff into the lungs daily as needed.    ? furosemide (LASIX) 20 MG tablet Take 1 tablet (20 mg total) by mouth daily as needed for fluid or edema. (Patient taking differently: Take 20 mg by mouth daily.) 14 tablet 1  ? magnesium oxide (MAG-OX) 400 (241.3 Mg) MG tablet Take 400 mg by mouth 2 (two) times daily.    ? metFORMIN (GLUCOPHAGE) 1000 MG tablet Take 500 mg by mouth 2 (two) times daily.    ? metoprolol succinate (TOPROL-XL) 25 MG 24 hr tablet Take 1/2 tablet (12.5 mg total) by mouth daily. 15 tablet 3  ? ondansetron (ZOFRAN-ODT) 4 MG disintegrating tablet Take 1 tablet by mouth daily as needed.    ? pioglitazone (ACTOS)  30 MG tablet Take 30 mg by mouth daily.    ? sacubitril-valsartan (ENTRESTO) 24-26 MG Take 0.5 tablets by mouth 2 (two) times daily. 90 tablet 3  ? ?No facility-administered medications prior to visit.

## 2022-02-01 IMAGING — CR DG CHEST 2V
2 series · 2 of 2 positions shown · non-contrast
Comparison: October 06, 2021

CLINICAL DATA: Positive flu

EXAM:
CHEST - 2 VIEW

[chest pa]
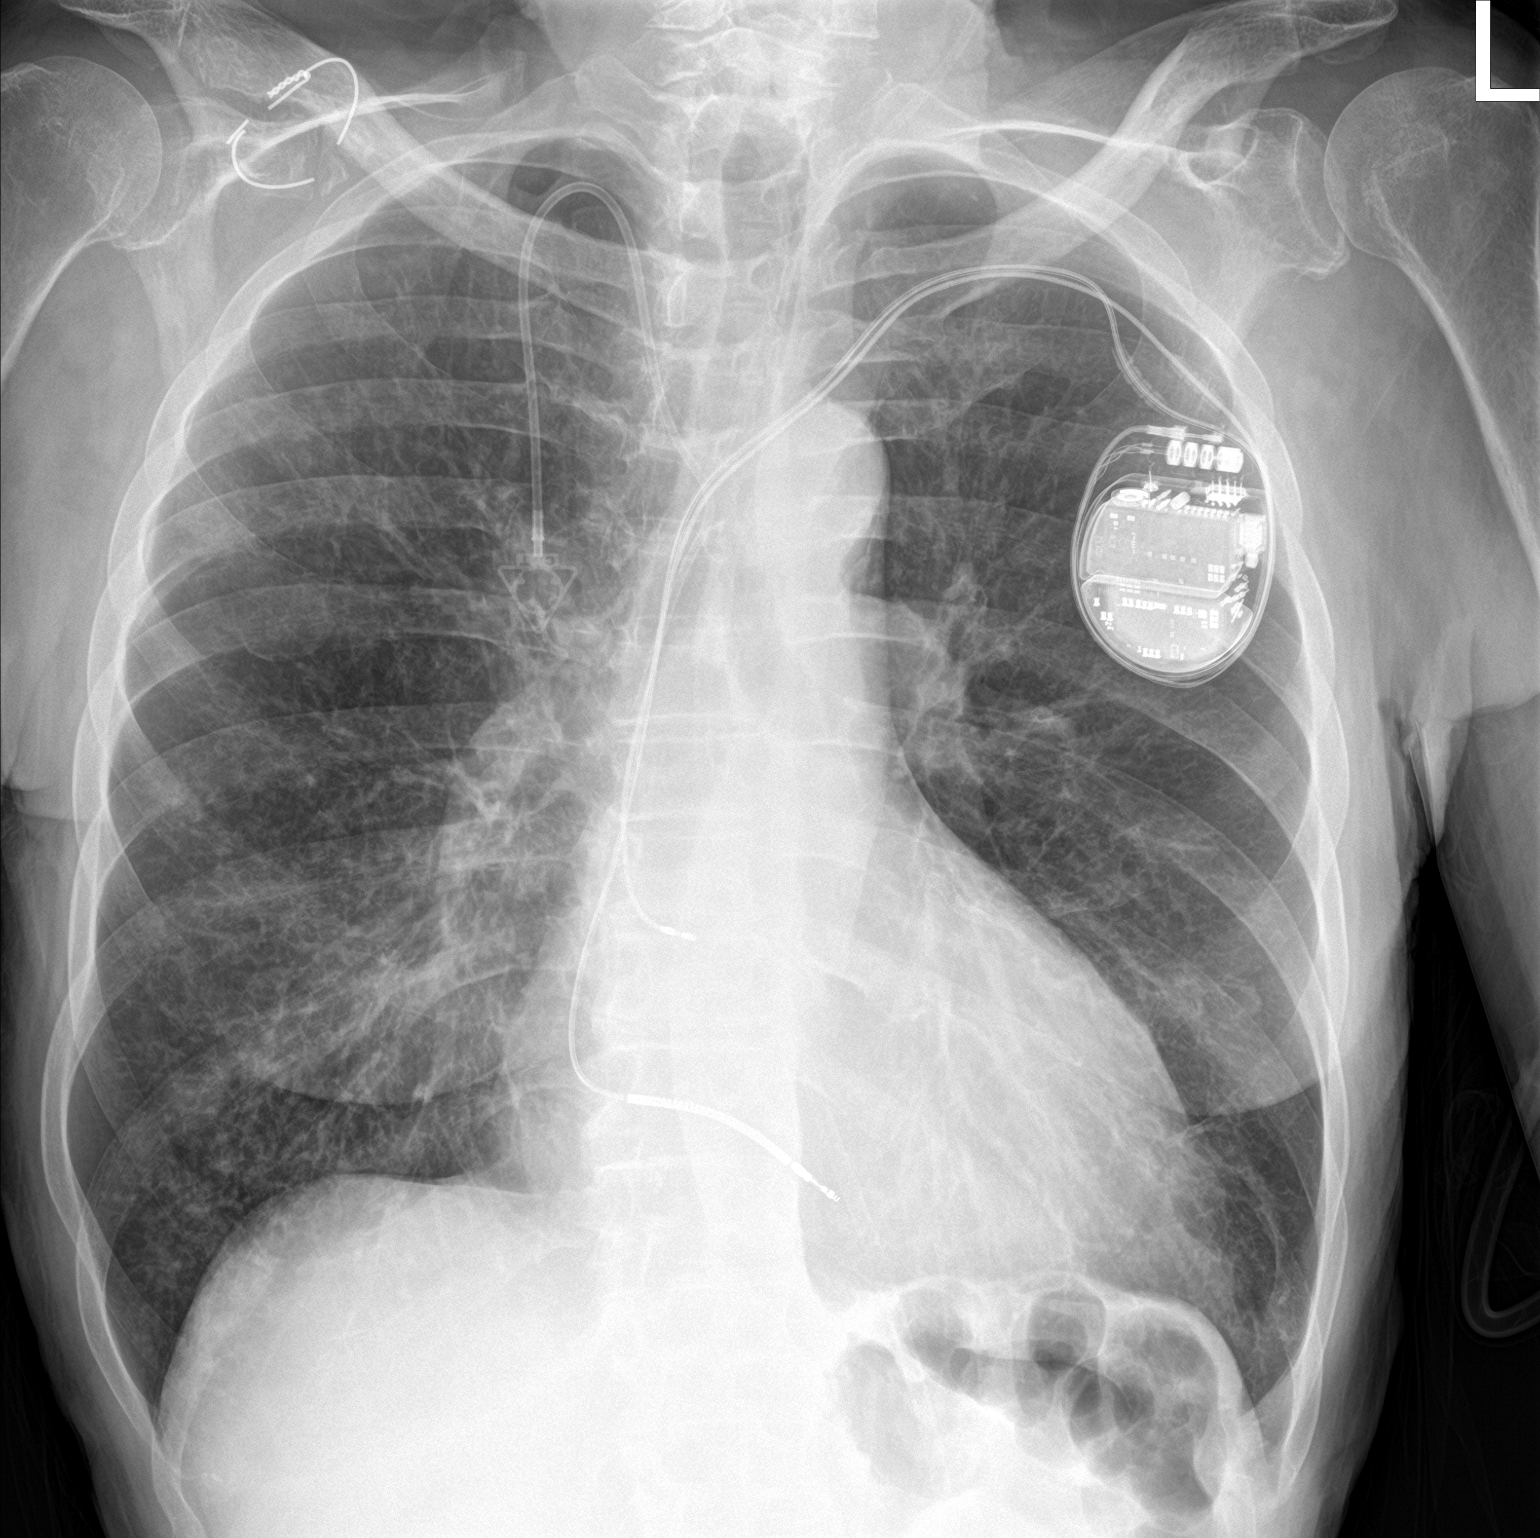

[chest lat]
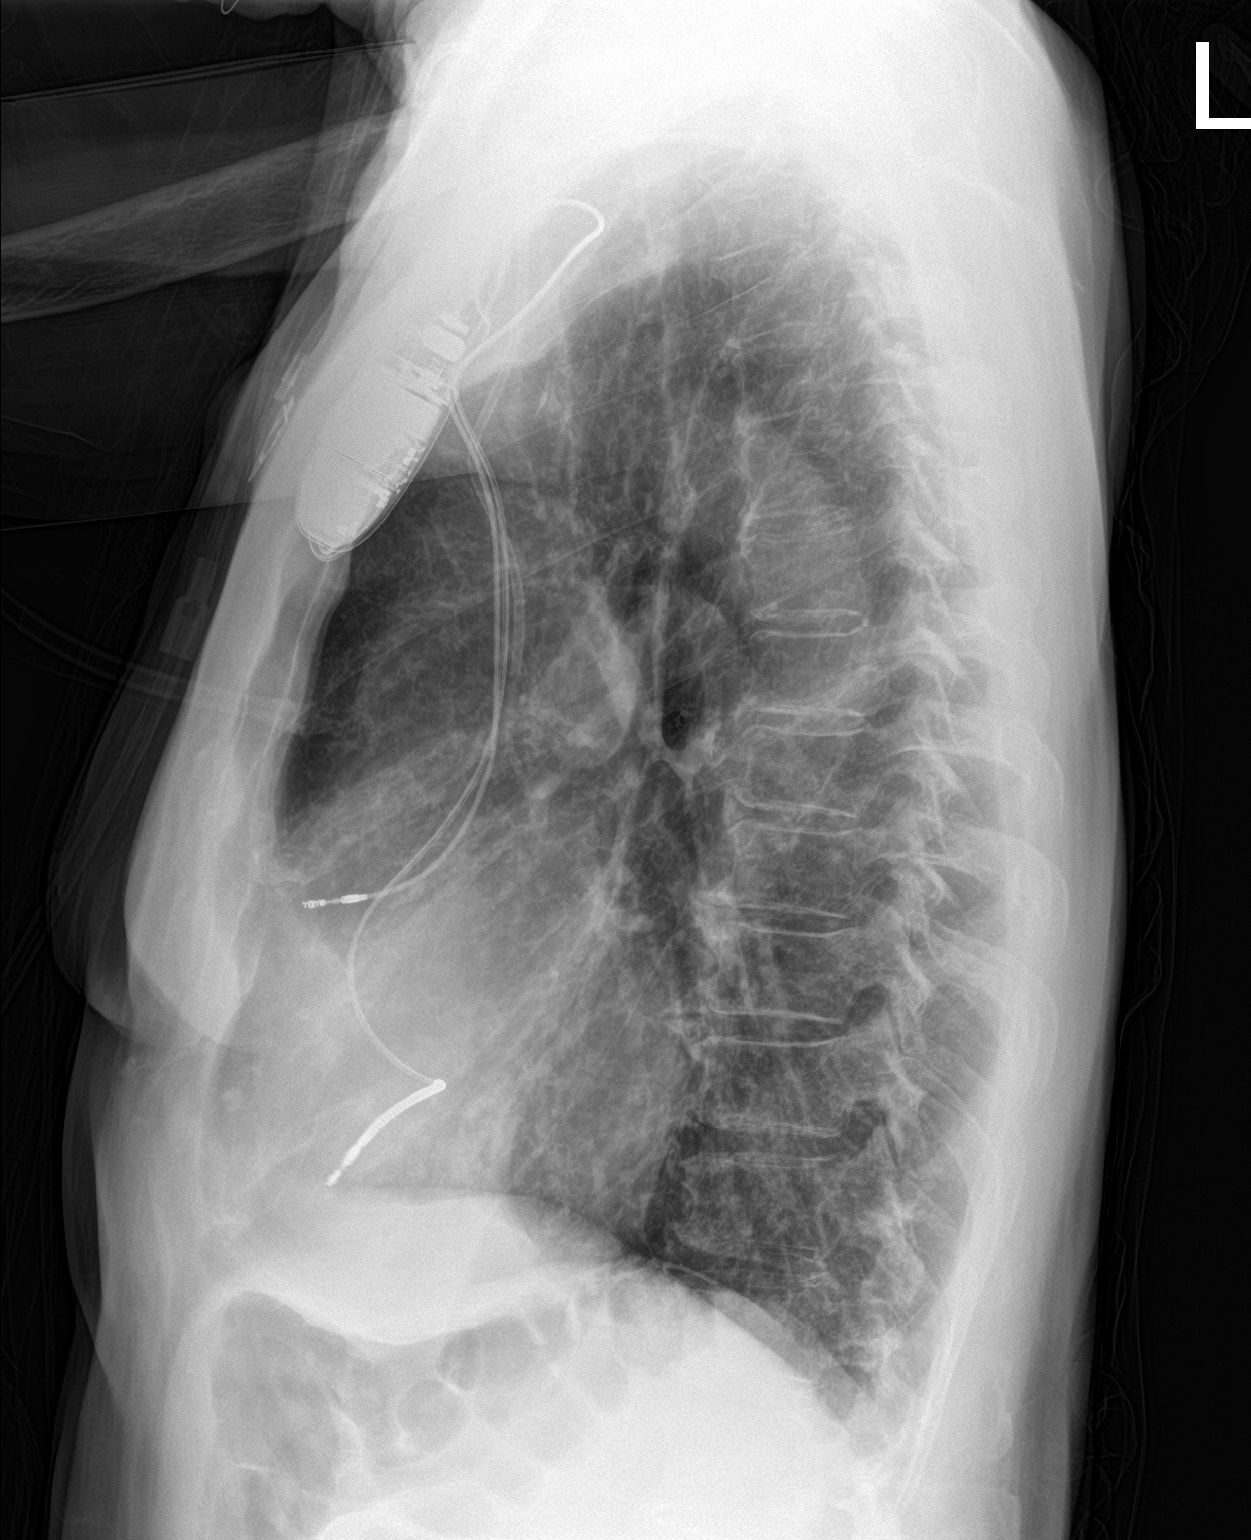

[2 of 2 positions shown; findings below may reference images not displayed]

FINDINGS: Unchanged position of the right chest Port-A-Cath and left chest
AICD/pacemaker. Stable mild cardiomegaly. No focal airspace
consolidation. No pleural effusion or pneumothorax. The postsurgical
change are present at the right shoulder with chronically fractured
cerclage wire.
IMPRESSION: No active cardiopulmonary disease.

## 2022-02-04 DIAGNOSIS — I5022 Chronic systolic (congestive) heart failure: Secondary | ICD-10-CM | POA: Diagnosis not present

## 2022-02-04 DIAGNOSIS — R300 Dysuria: Secondary | ICD-10-CM | POA: Diagnosis not present

## 2022-02-05 ENCOUNTER — Encounter: Payer: Self-pay | Admitting: Oncology

## 2022-02-05 LAB — URINALYSIS, ROUTINE W REFLEX MICROSCOPIC
Bilirubin, UA: NEGATIVE
Glucose, UA: NEGATIVE
Ketones, UA: NEGATIVE
Leukocytes,UA: NEGATIVE
Nitrite, UA: NEGATIVE
RBC, UA: NEGATIVE
Specific Gravity, UA: 1.015 (ref 1.005–1.030)
Urobilinogen, Ur: 0.2 mg/dL (ref 0.2–1.0)
pH, UA: 7 (ref 5.0–7.5)

## 2022-02-05 LAB — BASIC METABOLIC PANEL
BUN/Creatinine Ratio: 10 (ref 10–24)
BUN: 12 mg/dL (ref 8–27)
CO2: 32 mmol/L — ABNORMAL HIGH (ref 20–29)
Calcium: 9 mg/dL (ref 8.6–10.2)
Chloride: 102 mmol/L (ref 96–106)
Creatinine, Ser: 1.15 mg/dL (ref 0.76–1.27)
Glucose: 156 mg/dL — ABNORMAL HIGH (ref 70–99)
Potassium: 3.2 mmol/L — ABNORMAL LOW (ref 3.5–5.2)
Sodium: 146 mmol/L — ABNORMAL HIGH (ref 134–144)
eGFR: 64 mL/min/{1.73_m2} (ref >59)

## 2022-02-05 LAB — MICROSCOPIC EXAMINATION
Bacteria, UA: NONE SEEN
Casts: NONE SEEN /lpf
Epithelial Cells (non renal): NONE SEEN /hpf (ref 0–10)
WBC, UA: NONE SEEN /hpf (ref 0–5)

## 2022-02-05 LAB — PRO B NATRIURETIC PEPTIDE: NT-Pro BNP: 7003 pg/mL — ABNORMAL HIGH (ref 0–486)

## 2022-02-05 IMAGING — CR DG CHEST 1V
1 series · 1 of 1 positions shown · non-contrast
Comparison: 10/13/2021

CLINICAL DATA: Shortness of breath.

EXAM:
CHEST  1 VIEW

[chest ap]
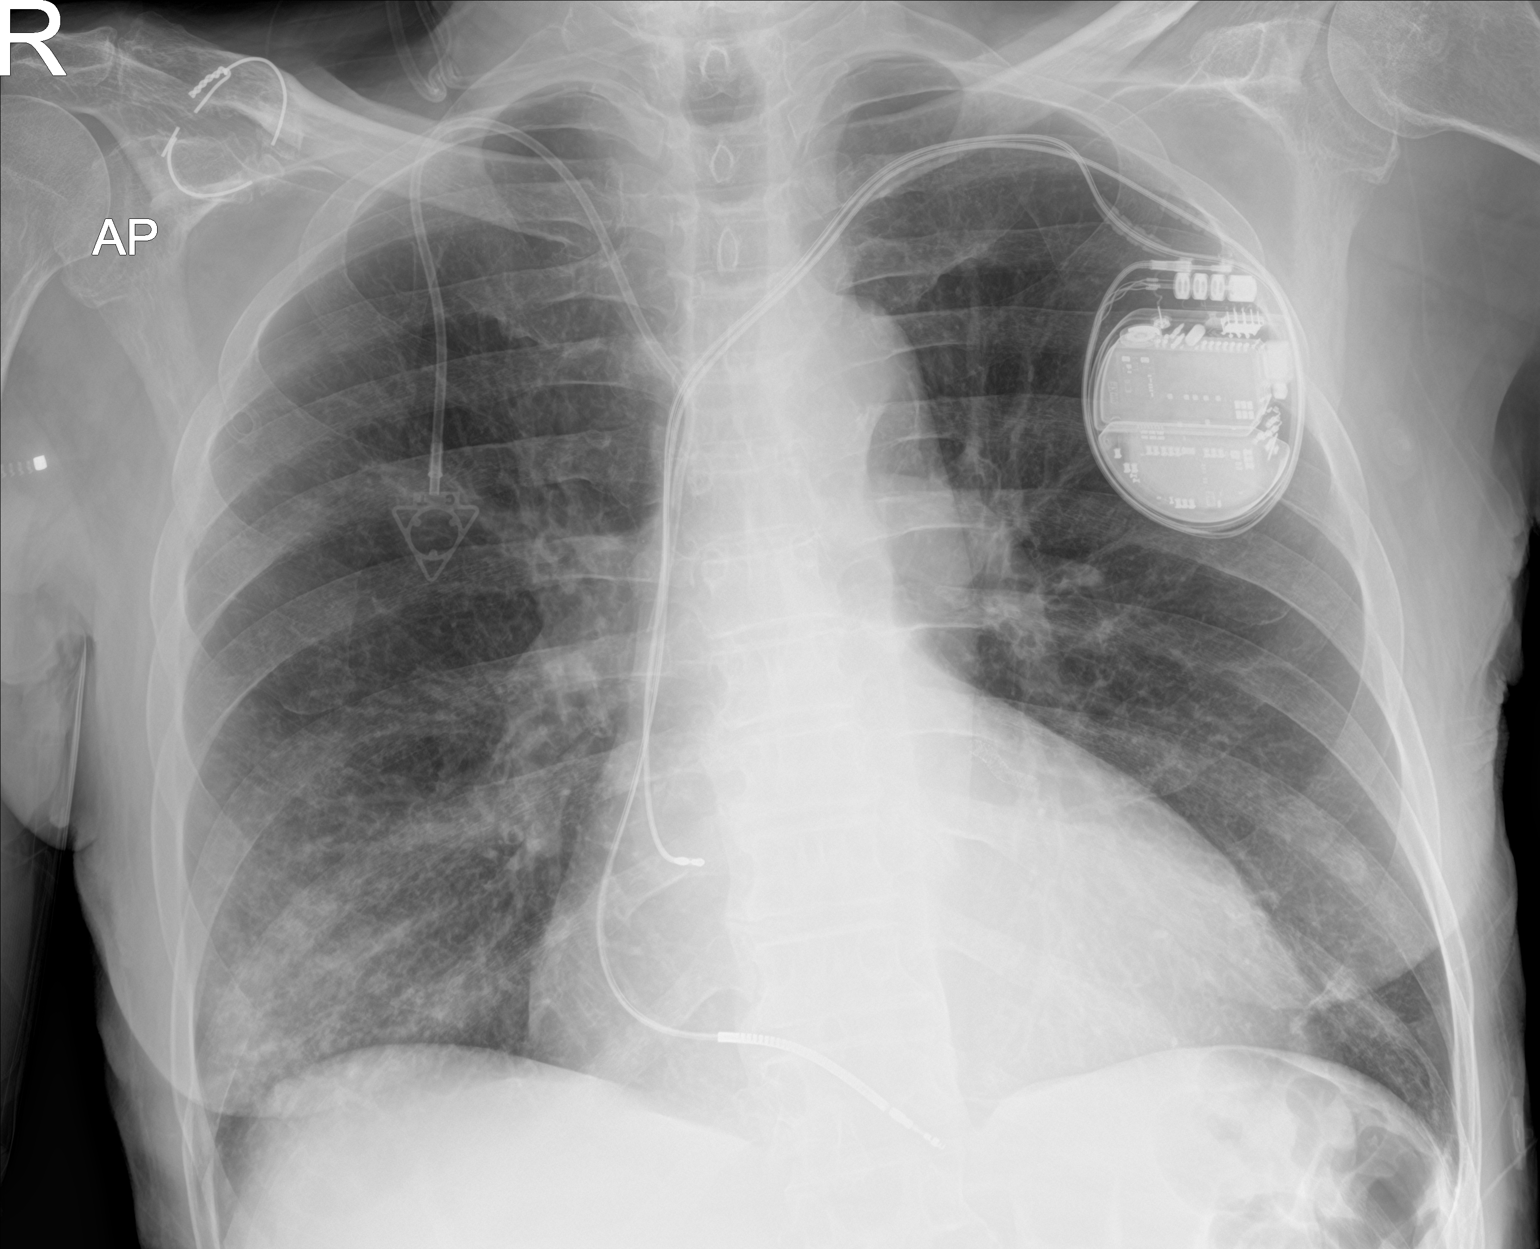

[1 of 1 positions shown; findings below may reference images not displayed]

FINDINGS: A right jugular Port-A-Cath and left subclavian approach ICD are
unchanged. The cardiac silhouette remains mildly enlarged,
accentuated by AP technique and lower lung volumes on the current
study. New patchy airspace opacity is present laterally in the right
lung base. No sizable pleural effusion or pneumothorax is
identified. Postsurgical changes are again noted at the right
shoulder with chronically fractured cerclage wire.
IMPRESSION: New patchy right basilar airspace opacity concerning for pneumonia.

## 2022-02-06 ENCOUNTER — Other Ambulatory Visit: Payer: Self-pay | Admitting: Student

## 2022-02-06 MED ORDER — POTASSIUM CHLORIDE CRYS ER 20 MEQ PO TBCR: 20.0000 meq | EXTENDED_RELEASE_TABLET | Freq: Every day | ORAL | 3 refills | Status: DC

## 2022-02-06 NOTE — Progress Notes (Signed)
Called pt daughter answered, gave her the message above

## 2022-02-07 ENCOUNTER — Encounter (HOSPITAL_COMMUNITY): Payer: Self-pay

## 2022-02-07 ENCOUNTER — Telehealth: Payer: Self-pay

## 2022-02-07 ENCOUNTER — Telehealth (HOSPITAL_COMMUNITY): Payer: Self-pay

## 2022-02-07 DIAGNOSIS — R300 Dysuria: Secondary | ICD-10-CM | POA: Diagnosis not present

## 2022-02-07 DIAGNOSIS — N390 Urinary tract infection, site not specified: Secondary | ICD-10-CM | POA: Diagnosis not present

## 2022-02-07 DIAGNOSIS — R197 Diarrhea, unspecified: Secondary | ICD-10-CM | POA: Diagnosis not present

## 2022-02-07 DIAGNOSIS — R339 Retention of urine, unspecified: Secondary | ICD-10-CM | POA: Diagnosis not present

## 2022-02-07 NOTE — Telephone Encounter (Signed)
Attempted to contact pt in regards to PR, unable to leave VM. VM box full. ?  ?Mailed letter ?

## 2022-02-07 NOTE — Telephone Encounter (Signed)
Error

## 2022-02-12 ENCOUNTER — Inpatient Hospital Stay: Payer: Medicare PPO | Attending: Oncology | Admitting: Oncology

## 2022-02-12 ENCOUNTER — Telehealth: Payer: Self-pay | Admitting: Oncology

## 2022-02-12 ENCOUNTER — Encounter: Payer: Self-pay | Admitting: Oncology

## 2022-02-12 ENCOUNTER — Other Ambulatory Visit: Payer: Self-pay | Admitting: Oncology

## 2022-02-12 ENCOUNTER — Inpatient Hospital Stay: Payer: Medicare PPO

## 2022-02-12 VITALS — BP 114/56 | HR 63 | Temp 98.0°F | Resp 20 | Ht 66.0 in | Wt 163.7 lb

## 2022-02-12 DIAGNOSIS — C4339 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: Secondary | ICD-10-CM

## 2022-02-12 DIAGNOSIS — Z79899 Other long term (current) drug therapy: Secondary | ICD-10-CM | POA: Diagnosis not present

## 2022-02-12 DIAGNOSIS — D539 Nutritional anemia, unspecified: Secondary | ICD-10-CM | POA: Diagnosis not present

## 2022-02-12 DIAGNOSIS — D519 Vitamin B12 deficiency anemia, unspecified: Secondary | ICD-10-CM | POA: Diagnosis not present

## 2022-02-12 DIAGNOSIS — E538 Deficiency of other specified B group vitamins: Secondary | ICD-10-CM | POA: Diagnosis not present

## 2022-02-12 DIAGNOSIS — D649 Anemia, unspecified: Secondary | ICD-10-CM | POA: Diagnosis not present

## 2022-02-12 DIAGNOSIS — Z5112 Encounter for antineoplastic immunotherapy: Secondary | ICD-10-CM | POA: Diagnosis not present

## 2022-02-12 LAB — HEPATIC FUNCTION PANEL
ALT: 19 U/L (ref 10–40)
AST: 21 (ref 14–40)
Alkaline Phosphatase: 47 (ref 25–125)
Bilirubin, Total: 0.6

## 2022-02-12 LAB — TSH: TSH: 2.532 u[IU]/mL (ref 0.350–4.500)

## 2022-02-12 LAB — COMPREHENSIVE METABOLIC PANEL
Albumin: 3.7 (ref 3.5–5.0)
Calcium: 8.2 — AB (ref 8.7–10.7)

## 2022-02-12 LAB — BASIC METABOLIC PANEL
BUN: 17 (ref 4–21)
CO2: 32 — AB (ref 13–22)
Chloride: 104 (ref 99–108)
Creatinine: 1 (ref 0.6–1.3)
Glucose: 131
Potassium: 3.5 mEq/L (ref 3.5–5.1)
Sodium: 142 (ref 137–147)

## 2022-02-12 LAB — CBC AND DIFFERENTIAL
HCT: 31 — AB (ref 41–53)
Hemoglobin: 9.9 — AB (ref 13.5–17.5)
Neutrophils Absolute: 2.74
Platelets: 156 K/uL (ref 150–400)
WBC: 3.8

## 2022-02-12 LAB — CBC: RBC: 3.31 — AB (ref 3.87–5.11)

## 2022-02-12 LAB — VITAMIN B12: Vitamin B-12: 158 pg/mL — ABNORMAL LOW (ref 180–914)

## 2022-02-12 NOTE — Telephone Encounter (Signed)
Per 02/12/22 los next appt scheduled and confirmed with patient ?

## 2022-02-12 NOTE — Progress Notes (Signed)
?[facility]  ?[address] ?[place],  [place]  [address] ?[phone] ? ?Clinic Day:  02/12/22 ? ?Referring physician: [name], PA-C ? ?ASSESSMENT & PLAN:  ? 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 CT neck and chest in June did not reveal any evidence of progressive disease. CT imaging from February 2023 remains without evidence of metastatic disease. His disease remains under control and so we put treatment on hold until his cardiorespiratory issues were addressed.  He is doing somewhat better now and so we will resume treatment. ? ?Macrocytic anemia ?Anemia, despite monthly B12 injections. Since he has missed some doses, I will recheck a B12 level when he returns. ? 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 Findings are most consistent with an infectious or inflammatory process in a pattern suggestive of atypical infection including nontuberculous mycobacterium.  He was seen by Dr. [name], who recommended bronchoscopy, but the patient did not return for follow-up as he does not wish to pursue this procedure.  His symptoms are not severe at this time. ? ?Plan: ?With the CT results, we held nivolumab and did not refer him to Dr. [name] for possible MAC infection.  He recommended bronchoscopy but the patient declined.  He seems improved clinically and so we will resume his monthly treatments with nivolumab.  He is back on daily Lasix and is taking his medications. We will plan to see him back in 1 month with a CBC, comprehensive metabolic panel, magnesium, TSH and B12 for repeat evaluation. The patient understands the plans discussed today and is in agreement with them.  He knows to contact our office if he develops concerns prior to his next appointment. ? ?I provided 15 minutes  of face-to-face time during this this encounter and > 50% was spent counseling as documented under my assessment and plan.  ? ? ? ?CHIEF COMPLAINT:  ?CC:  Recurrent melanoma right cheek ? ?Current Treatment:  Maintenance nivolumab ? ? ?HISTORY OF PRESENT ILLNESS:  ? 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 Staging form: Melanoma of the Skin, AJCC 8th Edition ?- Clinical stage from 01/24/2015: Stage IIB (cT3b, cN0, cM0) - Signed by [name], MD on 08/19/2021 ?Histopathologic type: Malignant melanoma, NOS (except juvenile melanoma M-8770/0) ?Stage prefix: Initial diagnosis ?Laterality: Right ?Lymph-vascular invasion (LVI): LVI not present (absent)/not identified ?Diagnostic confirmation: Positive histology ?Specimen type: Excision ?Staged by: Managing physician ?Mitotic count: 10 ?Mitotic unit: mm2 ?Clark's level: Level IV ?Tumor-infiltrating lymphocytes: Unknown ?Neurotropism: Absent ?Presence of extranodal extension: Absent ?Breslow depth (mm): 40 ?Ulceration of the epidermis: Yes ?Microsatellites: No ?Primary tumor regression: Unknown ?Sentinel lymph node biopsy performed: No ?Matted nodes: No ?Prognostic indicators: Wide excision negative ?Stage used in treatment planning: Yes ?National guidelines used in treatment planning: Yes ?Type of national guideline used in treatment planning: NCCN ? ?  ?07/18/2020 -  Chemotherapy  ? Patient is on Treatment Plan : MELANOMA Nivolumab + B12 q28d  ? ?  ?  ?07/29/2020 Initial Diagnosis  ? 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 ?  ?  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  His original lesion was diagnosed in March 2016.  He underwent excision, but refused aggressive surgery.  The lesion was at least 4 mm thick with  a Clark's level 4, and a mitotic index 10/mm?Marland Kitchen  The margins were positive, but wide reexcision revealed no residual melanoma.  He had recurrence within 2 months and the lesion had  been steadily growing.  He initially had refused seeing a medical oncologist, but finally came to [place] for evaluation and treatment in October 2016.  He does have extensive comorbidities including COPD, diabetes, hypertension, coronary artery disease, history of myocardial infarction, and pacemaker with AICD.  PET revealed hypermetabolism in the right submandibular node, but no evidence of distant metastasis.  He was having significant pain and requiring regular hydrocodone doses.  He also had severe disfigurement and avoided going out in public because of the facial lesion.  He has been receiving palliative nivolumab since November 2016 and has had a good response with a decrease in the right facial tumor.  He has had associated hypomagnesemia and hypokalemia, so is on oral magnesium and potassium supplements. He has had mild chronic anemia, which has been stable. He was found to have B12 deficiency and continues B12 injections monthly.  CT head/neck in May 2017 revealed some nonspecific skin thickening in the right face in the area of his melanoma in the right submandibular lymph node had decreased in size, now measuring 6 x 12 mm, but still prominent when compared to the left submandibular node.  He had 15 cycles of nivolumab and relocated to continue to get the same treatment.  He had his 30th dose in February 2018 and then returned to the [place] area.   ?  ?He was admitted to the hospital in early March 2018 for congestive heart failure associated with pleural and pericardial effusions.  He had no leukocytosis, fever or purulent sputum.  The echocardiogram revealed severe diffuse hypokinesis with mild concentric hypertrophy and markedly depressed ejection fraction of 20 to 25%, associated with moderate mitral regurgitation and moderate left atrial dilatation.  He improved with diuresis.  CT chest in March 2018 revealed stable cardiomegaly and stable small pericardial effusion, but no evidence of immune  mediated pneumonitis or metastatic disease.  The previously seen bilateral pleural effusions had resolved.  He returned to our care and nivolumab every 2 weeks was resumed in April.  CT chest was repeated in early May after 1 month back on therapy.  This was stable, without evidence of recurrent pleural effusions, immunotherapy toxicities, or evidence of metastatic disease.  He was switched to every 28-day nivolumab in August 2018.  He has had stable disease so has continued on nivolumab.  CT imaging in October 2018 revealed a stable 5 mm level 1B cervical lymph node, as well as a stable 8 mm exophytic lesion of the right face.  CT chest did not reveal any evidence of metastatic disease. Borderline cardiomegaly and a small amount of pericardial fluid/thickening was seen, but not reaccumulation of the pleural effusions.  CT head did not reveal any evidence of intracranial metastasis.  There were chronic ischemic changes and evidence of old infarcts. ? ?CT neck and chest in February 2019 were stable.  He was admitted to [facility] in May 2019 twice with severe hypoglycemia.  Repeat imaging in May did not reveal any progressive disease.  His nivolumab was held several times that year unfortunately, his wife passed away in May 26, 2018 and she was his primary caregiver.  His daughters have been caring for him since then.  CT neck and chest in November 2019 did not reveal any evidence of recurrent or metastatic disease.  CT  imaging in January 2020 did not reveal evidence of recurrence, so he has continued nivolumab every 4 weeks.  He was given mirtazapine for depression and weight loss the dose was increased to 15 mg.  CT scans in June 2020 remained stable with a small pericardial effusion and bilateral renal cysts. CT neck, chest and abdomen in November 2020 did not reveal any evidence of metastasis. CT head, neck and chest from May and October 2021 were negative for evidence of metastatic disease.  He was then lost to  follow-up from December 2021 to April 2022, at which time he resumed nivolumab and B12 every 4 weeks.   CT head, neck and chest in June of 2022 did not reveal any evidence of progressive disease.He was admitted

## 2022-02-13 ENCOUNTER — Other Ambulatory Visit: Payer: Self-pay | Admitting: Pharmacist

## 2022-02-13 ENCOUNTER — Encounter: Payer: Self-pay | Admitting: Oncology

## 2022-02-14 DIAGNOSIS — I5021 Acute systolic (congestive) heart failure: Secondary | ICD-10-CM | POA: Diagnosis not present

## 2022-02-14 LAB — T4: T4, Total: 9.3 ug/dL (ref 4.5–12.0)

## 2022-02-18 ENCOUNTER — Inpatient Hospital Stay: Payer: Medicare PPO

## 2022-02-18 VITALS — BP 121/50 | HR 70 | Temp 97.6°F | Resp 20 | Ht 67.0 in | Wt 164.0 lb

## 2022-02-18 DIAGNOSIS — Z5112 Encounter for antineoplastic immunotherapy: Secondary | ICD-10-CM | POA: Diagnosis not present

## 2022-02-18 DIAGNOSIS — C4339 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: Secondary | ICD-10-CM

## 2022-02-18 DIAGNOSIS — D539 Nutritional anemia, unspecified: Secondary | ICD-10-CM | POA: Diagnosis not present

## 2022-02-18 DIAGNOSIS — E538 Deficiency of other specified B group vitamins: Secondary | ICD-10-CM | POA: Diagnosis not present

## 2022-02-18 DIAGNOSIS — Z79899 Other long term (current) drug therapy: Secondary | ICD-10-CM | POA: Diagnosis not present

## 2022-02-18 MED ORDER — HEPARIN SOD (PORK) LOCK FLUSH 100 UNIT/ML IV SOLN
500.0000 [IU] | Freq: Once | INTRAVENOUS | Status: AC | PRN
  Administered 2022-02-18: 500 [IU]

## 2022-02-18 MED ORDER — SODIUM CHLORIDE 0.9 % IV SOLN: Freq: Once | INTRAVENOUS | Status: AC

## 2022-02-18 MED ORDER — CYANOCOBALAMIN 1000 MCG/ML IJ SOLN
1000.0000 ug | Freq: Once | INTRAMUSCULAR | Status: AC
  Administered 2022-02-18: 1000 ug via INTRAMUSCULAR
  Filled 2022-02-18: qty 1

## 2022-02-18 MED ORDER — SODIUM CHLORIDE 0.9 % IV SOLN
480.0000 mg | Freq: Once | INTRAVENOUS | Status: AC
  Administered 2022-02-18: 480 mg via INTRAVENOUS
  Filled 2022-02-18: qty 48

## 2022-02-18 MED ORDER — SODIUM CHLORIDE 0.9% FLUSH
10.0000 mL | INTRAVENOUS | Status: DC | PRN
  Administered 2022-02-18: 10 mL

## 2022-02-18 NOTE — Patient Instructions (Signed)
Nivolumab injection °What is this medication? °NIVOLUMAB (nye VOL ue mab) is a monoclonal antibody. It treats certain types of cancer. Some of the cancers treated are colon cancer, head and neck cancer, Hodgkin lymphoma, lung cancer, and melanoma. °This medicine may be used for other purposes; ask your health care provider or pharmacist if you have questions. °COMMON BRAND NAME(S): Opdivo °What should I tell my care team before I take this medication? °They need to know if you have any of these conditions: °Autoimmune diseases such as Crohn's disease, ulcerative colitis, or lupus °Have had or planning to have an allogeneic stem cell transplant (uses someone else's stem cells) °History of chest radiation °Organ transplant °Nervous system problems such as myasthenia gravis or Guillain-Barre syndrome °An unusual or allergic reaction to nivolumab, other medicines, foods, dyes, or preservatives °Pregnant or trying to get pregnant °Breast-feeding °How should I use this medication? °This medication is injected into a vein. It is given in a hospital or clinic setting. °A special MedGuide will be given to you before each treatment. Be sure to read this information carefully each time. °Talk to your care team regarding the use of this medication in children. While it may be prescribed for children as young as 12 years for selected conditions, precautions do apply. °Overdosage: If you think you have taken too much of this medicine contact a poison control center or emergency room at once. °NOTE: This medicine is only for you. Do not share this medicine with others. °What if I miss a dose? °Keep appointments for follow-up doses. It is important not to miss your dose. Call your care team if you are unable to keep an appointment. °What may interact with this medication? °Interactions have not been studied. °This list may not describe all possible interactions. Give your health care provider a list of all the medicines, herbs,  non-prescription drugs, or dietary supplements you use. Also tell them if you smoke, drink alcohol, or use illegal drugs. Some items may interact with your medicine. °What should I watch for while using this medication? °Your condition will be monitored carefully while you are receiving this medication. °You may need blood work done while you are taking this medication. °Do not become pregnant while taking this medication or for 5 months after stopping it. Women should inform their care team if they wish to become pregnant or think they might be pregnant. There is a potential for serious harm to an unborn child. Talk to your care team for more information. Do not breast-feed an infant while taking this medication or for 5 months after stopping it. °What side effects may I notice from receiving this medication? °Side effects that you should report to your care team as soon as possible: °Allergic reactions--skin rash, itching, hives, swelling of the face, lips, tongue, or throat °Bloody or black, tar-like stools °Change in vision °Chest pain °Diarrhea °Dry cough, shortness of breath or trouble breathing °Eye pain °Fast or irregular heartbeat °Fever, chills °High blood sugar (hyperglycemia)--increased thirst or amount of urine, unusual weakness or fatigue, blurry vision °High thyroid levels (hyperthyroidism)--fast or irregular heartbeat, weight loss, excessive sweating or sensitivity to heat, tremors or shaking, anxiety, nervousness, irregular menstrual cycle or spotting °Kidney injury--decrease in the amount of urine, swelling of the ankles, hands, or feet °Liver injury--right upper belly pain, loss of appetite, nausea, light-colored stool, dark yellow or brown urine, yellowing skin or eyes, unusual weakness or fatigue °Low red blood cell count--unusual weakness or fatigue, dizziness, headache, trouble breathing °  Low thyroid levels (hypothyroidism)--unusual weakness or fatigue, increased sensitivity to cold,  constipation, hair loss, dry skin, weight gain, feelings of depression °Mood and behavior changes-confusion, change in sex drive or performance, irritability °Muscle pain or cramps °Pain, tingling, or numbness in the hands or feet, muscle weakness, trouble walking, loss of balance or coordination °Red or dark brown urine °Redness, blistering, peeling, or loosening of the skin, including inside the mouth °Stomach pain °Unusual bruising or bleeding °Side effects that usually do not require medical attention (report to your care team if they continue or are bothersome): °Bone pain °Constipation °Loss of appetite °Nausea °Tiredness °Vomiting °This list may not describe all possible side effects. Call your doctor for medical advice about side effects. You may report side effects to FDA at 1-800-FDA-1088. °Where should I keep my medication? °This medication is given in a hospital or clinic and will not be stored at home. °NOTE: This sheet is a summary. It may not cover all possible information. If you have questions about this medicine, talk to your doctor, pharmacist, or health care provider. °© 2022 [employer] (2021-07-16 00:00:00) ° °

## 2022-02-19 ENCOUNTER — Telehealth: Payer: Self-pay

## 2022-02-19 NOTE — Telephone Encounter (Signed)
-----   Message from [name], MD sent at 02/13/2022  2:06 PM EDT ----- ?Regarding: B12 ?B12 level low, hopefully we can keep him on schedule now with monthly injections ? ?

## 2022-02-24 ENCOUNTER — Encounter: Payer: Self-pay | Admitting: Oncology

## 2022-02-24 ENCOUNTER — Other Ambulatory Visit: Payer: Self-pay | Admitting: Hematology and Oncology

## 2022-02-25 ENCOUNTER — Telehealth (HOSPITAL_COMMUNITY): Payer: Self-pay

## 2022-02-25 NOTE — Telephone Encounter (Signed)
No response from pt regarding PR.   Closed referral. 

## 2022-02-28 ENCOUNTER — Encounter: Payer: Self-pay | Admitting: Oncology

## 2022-03-19 ENCOUNTER — Other Ambulatory Visit: Payer: Self-pay | Admitting: Oncology

## 2022-03-19 ENCOUNTER — Inpatient Hospital Stay: Payer: Medicare PPO | Attending: Oncology | Admitting: Oncology

## 2022-03-19 ENCOUNTER — Inpatient Hospital Stay: Payer: Medicare PPO

## 2022-03-19 ENCOUNTER — Encounter: Payer: Self-pay | Admitting: Oncology

## 2022-03-19 VITALS — BP 116/61 | HR 68 | Temp 98.1°F | Resp 18 | Ht 67.0 in | Wt 161.8 lb

## 2022-03-19 DIAGNOSIS — Z5112 Encounter for antineoplastic immunotherapy: Secondary | ICD-10-CM | POA: Diagnosis not present

## 2022-03-19 DIAGNOSIS — C4339 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: Secondary | ICD-10-CM

## 2022-03-19 DIAGNOSIS — E538 Deficiency of other specified B group vitamins: Secondary | ICD-10-CM | POA: Diagnosis not present

## 2022-03-19 DIAGNOSIS — D539 Nutritional anemia, unspecified: Secondary | ICD-10-CM | POA: Diagnosis not present

## 2022-03-19 DIAGNOSIS — Z79899 Other long term (current) drug therapy: Secondary | ICD-10-CM | POA: Insufficient documentation

## 2022-03-19 LAB — HEPATIC FUNCTION PANEL
ALT: 21 U/L (ref 10–40)
AST: 23 (ref 14–40)
Alkaline Phosphatase: 71 (ref 25–125)
Bilirubin, Total: 0.5

## 2022-03-19 LAB — BASIC METABOLIC PANEL
BUN: 21 (ref 4–21)
CO2: 24 — AB (ref 13–22)
Chloride: 105 (ref 99–108)
Creatinine: 1.2 (ref 0.6–1.3)
Glucose: 160
Potassium: 4.2 mEq/L (ref 3.5–5.1)
Sodium: 142 (ref 137–147)

## 2022-03-19 LAB — CBC: RBC: 3.29 — AB (ref 3.87–5.11)

## 2022-03-19 LAB — CBC AND DIFFERENTIAL
HCT: 31 — AB (ref 41–53)
Hemoglobin: 9.7 — AB (ref 13.5–17.5)
Neutrophils Absolute: 2.56
Platelets: 164 10*3/uL (ref 150–400)
WBC: 3.5

## 2022-03-19 LAB — COMPREHENSIVE METABOLIC PANEL
Albumin: 4.2 (ref 3.5–5.0)
Calcium: 8.8 (ref 8.7–10.7)

## 2022-03-19 LAB — TSH: TSH: 3.128 u[IU]/mL (ref 0.350–4.500)

## 2022-03-19 MED ORDER — MAGNESIUM OXIDE -MG SUPPLEMENT 400 (240 MG) MG PO TABS: 400.0000 mg | ORAL_TABLET | Freq: Two times a day (BID) | ORAL | 5 refills | Status: DC

## 2022-03-19 NOTE — Progress Notes (Signed)
Kevin [name]  [address] 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 CT neck and chest in June did not reveal any evidence of progressive disease. CT imaging from February 2023 remains without evidence of metastatic disease. His disease remains under control and so we put treatment on hold until his cardiorespiratory issues were addressed.  He is doing better and so we resumed treatment.  Macrocytic anemia Anemia, despite monthly B12 injections. He has missed some doses, and his B12 level was low at 158 when I rechecked it in April.  He is back on injections now.  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 Findings are most consistent with an infectious or inflammatory process in a pattern suggestive of atypical infection including nontuberculous mycobacterium.  He was seen by Dr. [name], who recommended bronchoscopy, but the patient did not return for follow-up as he does not wish to pursue this procedure.  His symptoms are not severe at this time.  Plan: He improved clinically and so we resumed his monthly treatments with nivolumab.  I also resumed his B12 injections monthly.  He is back on daily Lasix and is taking his medications. We will plan to see him back in 4 weeks with a CBC, comprehensive metabolic panel, magnesium, and TSH for repeat evaluation. The patient understands the plans discussed today and is in agreement.  He knows to contact our office if he develops concerns prior to his next appointment.  I provided 15 minutes of face-to-face time during this this encounter and > 50% was spent counseling as documented under my  assessment and plan.     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 ([place])  01/24/2015 Cancer Staging   Staging form: Melanoma of the Skin, AJCC 8th Edition - Clinical stage from 01/24/2015: Stage IIB (cT3b, cN0, cM0) - Signed by [name], MD on 08/19/2021 Histopathologic type: Malignant melanoma, NOS (except juvenile melanoma [id number]) Stage prefix: Initial diagnosis Laterality: Right Lymph-vascular invasion (LVI): LVI not present (absent)/not identified Diagnostic confirmation: Positive histology Specimen type: Excision Staged by: Managing physician Mitotic count: 10 Mitotic unit: mm2 Clark's level: Level IV Tumor-infiltrating lymphocytes: Unknown Neurotropism: Absent Presence of extranodal extension: Absent Breslow depth (mm): 40 Ulceration of the epidermis: Yes Microsatellites: No Primary tumor regression: Unknown Sentinel lymph node biopsy performed: No Matted nodes: No Prognostic indicators: Wide excision negative Stage used in treatment planning: Yes National guidelines used in treatment planning: Yes Type of national guideline used in treatment planning: NCCN    07/18/2020 -  Chemotherapy   Patient is on Treatment Plan : MELANOMA Nivolumab + B12 q28d      07/29/2020 Initial Diagnosis   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  His original lesion was diagnosed in March 2016.  He underwent excision, but refused aggressive surgery.  The lesion was at least 4 mm thick with a Clark's level 4, and a mitotic index 10/mm.  The margins were positive, but wide reexcision revealed  no residual melanoma.  He had recurrence within 2 months and the lesion had been steadily growing.  He initially had refused seeing a medical oncologist, but finally came to [place] for  evaluation and treatment in October 2016.  He does have extensive comorbidities including COPD, diabetes, hypertension, coronary artery disease, history of myocardial infarction, and pacemaker with AICD.  PET revealed hypermetabolism in the right submandibular node, but no evidence of distant metastasis.  He was having significant pain and requiring regular hydrocodone doses.  He also had severe disfigurement and avoided going out in public because of the facial lesion.  He has been receiving palliative nivolumab since November 2016 and has had a good response with a decrease in the right facial tumor.  He has had associated hypomagnesemia and hypokalemia, so is on oral magnesium and potassium supplements. He has had mild chronic anemia, which has been stable. He was found to have B12 deficiency and continues B12 injections monthly.  CT head/neck in May 2017 revealed some nonspecific skin thickening in the right face in the area of his melanoma in the right submandibular lymph node had decreased in size, now measuring 6 x 12 mm, but still prominent when compared to the left submandibular node.  He had 15 cycles of nivolumab and relocated to continue to get the same treatment.  He had his 30th dose in February 2018 and then returned to the [place] area.     He was admitted to the hospital in early March 2018 for congestive heart failure associated with pleural and pericardial effusions.  He had no leukocytosis, fever or purulent sputum.  The echocardiogram revealed severe diffuse hypokinesis with mild concentric hypertrophy and markedly depressed ejection fraction of 20 to 25%, associated with moderate mitral regurgitation and moderate left atrial dilatation.  He improved with diuresis.  CT chest in March 2018 revealed stable cardiomegaly and stable small pericardial effusion, but no evidence of immune mediated pneumonitis or metastatic disease.  The previously seen bilateral pleural effusions had resolved.  He  returned to our care and nivolumab every 2 weeks was resumed in April.  CT chest was repeated in early May after 1 month back on therapy.  This was stable, without evidence of recurrent pleural effusions, immunotherapy toxicities, or evidence of metastatic disease.  He was switched to every 28-day nivolumab in August 2018.  He has had stable disease so has continued on nivolumab.  CT imaging in October 2018 revealed a stable 5 mm level 1B cervical lymph node, as well as a stable 8 mm exophytic lesion of the right face.  CT chest did not reveal any evidence of metastatic disease. Borderline cardiomegaly and a small amount of pericardial fluid/thickening was seen, but not reaccumulation of the pleural effusions.  CT head did not reveal any evidence of intracranial metastasis.  There were chronic ischemic changes and evidence of old infarcts.  CT neck and chest in February 2019 were stable.  He was admitted to [facility] in May 2019 twice with severe hypoglycemia.  Repeat imaging in May did not reveal any progressive disease.  His nivolumab was held several times that year unfortunately, his wife passed away in Jun 13, 2018 and she was his primary caregiver.  His daughters have been caring for him since then.  CT neck and chest in November 2019 did not reveal any evidence of recurrent or metastatic disease.  CT imaging in January 2020 did not reveal evidence of recurrence, so he has continued nivolumab every 4 weeks.  He was given mirtazapine for depression and weight loss the dose was increased to 15 mg.  CT scans in June 2020 remained stable with a small pericardial effusion and bilateral renal cysts. CT neck, chest and abdomen in November 2020 did not reveal any evidence of metastasis. CT head, neck and chest from May and October 2021 were negative for evidence of metastatic disease.  He was then lost to follow-up from December 2021 to April 2022, at which time he resumed nivolumab and B12 every 4 weeks.   CT head,  neck and chest in June of 2022 did not reveal any evidence of progressive disease.He was admitted at [facility] in November/December of 2022 due to flu and pneumonia.   INTERVAL HISTORY:  Kevin [name] is here for follow-up after resuming his treatment after being on hold for the 2 months.  He declined further evaluation of his lung findings and improved clinically.  He denies shortness of breath except with exertion. He does have lower extremity edema, but has been taking his Lasix and seems to be feeling better.  He resumed his therapy last month and had no difficulties.  He does request a refill of his magnesium.  Hemoglobin has decreased from 9.9 to 9.7 with an MCV of 95 and his white count has decreased from 3.8 to 3.5 with an [place] of 2500, and platelets are normal. Chemistries are unremarkable. His  appetite is good, and his weight is stable since his last visit. He denies fever, chills or other signs of infection.  He denies nausea, vomiting, bowel issues, or abdominal pain.  He denies sore throat, cough, dyspnea, or chest pain.   REVIEW OF SYSTEMS:  Review of Systems  Constitutional: Negative.  Negative for appetite change, chills, fatigue, fever and unexpected weight change.  HENT:  Negative.    Eyes: Negative.   Respiratory: Negative.  Negative for chest tightness, cough, hemoptysis, shortness of breath and wheezing.   Cardiovascular:  Negative for chest pain, leg swelling (lower extremity edema) and palpitations.  Gastrointestinal: Negative.  Negative for abdominal distention, abdominal pain, blood in stool, constipation, diarrhea, nausea and vomiting.  Endocrine: Negative.   Genitourinary: Negative.  Negative for difficulty urinating, dysuria, frequency and hematuria.   Musculoskeletal: Negative.  Negative for arthralgias, back pain, flank pain, gait problem and myalgias.  Skin: Negative.   Neurological: Negative.  Negative for dizziness, extremity weakness, gait problem, headaches,  light-headedness, numbness, seizures and speech difficulty.  Hematological: Negative.   Psychiatric/Behavioral: Negative.  Negative for depression and sleep disturbance. The patient is not nervous/anxious.   All other systems reviewed and are negative.  Sob w/exertion VITALS:  Blood pressure 116/61, pulse 68, temperature 98.1 F (36.7 C), temperature source Oral, resp. rate 18, height _0  (1.702 m), weight 161 lb 12.8 oz (73.4 kg), SpO2 96 %.  Wt Readings from Last 3 Encounters:  03/21/22 162 lb 4 oz (73.6 kg)  03/19/22 161 lb 12.8 oz (73.4 kg)  02/18/22 164 lb (74.4 kg)    Body mass index is 25.34 kg/m.  Performance status (ECOG): 1 - Symptomatic but completely ambulatory  PHYSICAL EXAM:  Physical Exam Constitutional:      General: He is not in acute distress.    Appearance: Normal appearance. He is normal weight.  HENT:     Head: Normocephalic and atraumatic.     Comments: Facial edema.  Eyes:     General: No scleral icterus.    Extraocular Movements: Extraocular movements intact.     Conjunctiva/sclera:  Conjunctivae normal.     Pupils: Pupils are equal, round, and reactive to light.  Cardiovascular:     Rate and Rhythm: Normal rate and regular rhythm.     Pulses: Normal pulses.     Heart sounds: Normal heart sounds. No murmur heard.   No friction rub. No gallop.  Pulmonary:     Effort: Pulmonary effort is normal. No respiratory distress.     Breath sounds: Rales: bibasilar.  Abdominal:     General: Bowel sounds are normal. There is no distension.     Palpations: Abdomen is soft. There is no hepatomegaly, splenomegaly or mass.     Tenderness: There is no abdominal tenderness.  Musculoskeletal:        General: Normal range of motion.     Cervical back: Normal range of motion and neck supple.     Right lower leg: 1+ Edema present.     Left lower leg: 1+ Edema present.  Lymphadenopathy:     Cervical: No cervical adenopathy.  Skin:    General: Skin is warm and dry.      Comments: [place] of the right cheek which is stable.  Neurological:     General: No focal deficit present.     Mental Status: He is alert and oriented to person, place, and time. Mental status is at baseline.  Psychiatric:        Mood and Affect: Mood normal.        Behavior: Behavior normal.        Thought Content: Thought content normal.        Judgment: Judgment normal.    LABS:      Latest Ref Rng & Units 03/19/2022   12:00 AM 02/12/2022   12:00 AM 12/13/2021   12:00 AM  CBC  WBC  3.5      3.8      4.2    Hemoglobin 13.5 - 17.5 9.7      9.9      10.4    Hematocrit 41 - 53 31      31      32    Platelets 150 - 400 K/uL 164      156      258       This result is from an external source.      Latest Ref Rng & Units 03/19/2022    4:51 PM 03/19/2022   12:00 AM 02/12/2022   12:00 AM  CMP  BUN 4 - _0 Creatinine 0.6 - 1.3  1.2      1.0       Sodium 137 - 147  142      142       Potassium 3.5 - 5.1 mEq/L  4.2      3.5       Chloride 99 - 108  105      104       CO2 13 - 22  24      32       Calcium 8.7 - 10.7 8.8       8.2       Alkaline Phos 25 - 125  71      47       AST 14 - 40  23      21       ALT 10 - 40 U/L  21  19          This result is from an external source.    STUDIES:  No results found.   HISTORY:   Allergies: No Known Allergies  Current Medications: Current Outpatient Medications  Medication Sig Dispense Refill   amiodarone (PACERONE) 200 MG tablet Take 1 tablet (200 mg total) by mouth daily. 90 tablet 3   apixaban (ELIQUIS) 5 MG TABS tablet Take 1 tablet (5 mg total) by mouth 2 (two) times daily. 180 tablet 3   atorvastatin (LIPITOR) 80 MG tablet Take 1 tablet (80 mg total) by mouth at bedtime. 90 tablet 3   furosemide (LASIX) 20 MG tablet Take 1 tablet (20 mg total) by mouth daily as needed for fluid or edema. (Patient taking differently: Take 20 mg by mouth daily.) 14 tablet 1   magnesium oxide (MAG-OX) 400 (240 Mg) MG  tablet Take 1 tablet (400 mg total) by mouth 2 (two) times daily. 60 tablet 5   metFORMIN (GLUCOPHAGE) 1000 MG tablet Take 500 mg by mouth 2 (two) times daily.     metoprolol succinate (TOPROL-XL) 25 MG 24 hr tablet Take 1/2 tablet (12.5 mg total) by mouth daily. 15 tablet 3   ondansetron (ZOFRAN-ODT) 4 MG disintegrating tablet Take 1 tablet by mouth daily as needed.     potassium chloride SA (KLOR-CON M) 20 MEQ tablet Take 1 tablet (20 mEq total) by mouth daily. 90 tablet 3   sacubitril-valsartan (ENTRESTO) 24-26 MG Take 0.5 tablets by mouth 2 (two) times daily. 90 tablet 3   tamsulosin (FLOMAX) 0.4 MG CAPS capsule Take by mouth.     No current facility-administered medications for this visit.

## 2022-03-20 ENCOUNTER — Encounter: Payer: Self-pay | Admitting: Oncology

## 2022-03-20 LAB — T4: T4, Total: 10.4 ug/dL (ref 4.5–12.0)

## 2022-03-21 ENCOUNTER — Inpatient Hospital Stay: Payer: Medicare PPO

## 2022-03-21 VITALS — BP 123/59 | HR 65 | Temp 97.8°F | Resp 18 | Ht 67.0 in | Wt 162.2 lb

## 2022-03-21 DIAGNOSIS — C4339 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: Secondary | ICD-10-CM

## 2022-03-21 DIAGNOSIS — E538 Deficiency of other specified B group vitamins: Secondary | ICD-10-CM | POA: Diagnosis not present

## 2022-03-21 DIAGNOSIS — Z79899 Other long term (current) drug therapy: Secondary | ICD-10-CM | POA: Diagnosis not present

## 2022-03-21 DIAGNOSIS — D539 Nutritional anemia, unspecified: Secondary | ICD-10-CM | POA: Diagnosis not present

## 2022-03-21 DIAGNOSIS — Z5112 Encounter for antineoplastic immunotherapy: Secondary | ICD-10-CM | POA: Diagnosis not present

## 2022-03-21 MED ORDER — HEPARIN SOD (PORK) LOCK FLUSH 100 UNIT/ML IV SOLN
500.0000 [IU] | Freq: Once | INTRAVENOUS | Status: AC | PRN
  Administered 2022-03-21: 500 [IU]

## 2022-03-21 MED ORDER — CYANOCOBALAMIN 1000 MCG/ML IJ SOLN
1000.0000 ug | Freq: Once | INTRAMUSCULAR | Status: AC
  Administered 2022-03-21: 1000 ug via INTRAMUSCULAR
  Filled 2022-03-21: qty 1

## 2022-03-21 MED ORDER — SODIUM CHLORIDE 0.9 % IV SOLN
480.0000 mg | Freq: Once | INTRAVENOUS | Status: AC
  Administered 2022-03-21: 480 mg via INTRAVENOUS
  Filled 2022-03-21: qty 48

## 2022-03-21 MED ORDER — SODIUM CHLORIDE 0.9 % IV SOLN: Freq: Once | INTRAVENOUS | Status: AC

## 2022-03-21 MED ORDER — SODIUM CHLORIDE 0.9% FLUSH
10.0000 mL | INTRAVENOUS | Status: DC | PRN
  Administered 2022-03-21: 10 mL

## 2022-03-21 NOTE — Progress Notes (Signed)
1510: PT STABLE AT TIME OF DISCHARGE  

## 2022-04-04 ENCOUNTER — Encounter: Payer: Self-pay | Admitting: Oncology

## 2022-04-16 ENCOUNTER — Inpatient Hospital Stay: Payer: Medicare PPO

## 2022-04-16 ENCOUNTER — Inpatient Hospital Stay: Payer: Medicare PPO | Attending: Oncology | Admitting: Oncology

## 2022-04-16 ENCOUNTER — Encounter: Payer: Self-pay | Admitting: Oncology

## 2022-04-16 VITALS — BP 112/57 | HR 66 | Temp 97.0°F | Resp 18 | Ht 67.0 in | Wt 167.0 lb

## 2022-04-16 DIAGNOSIS — Z79899 Other long term (current) drug therapy: Secondary | ICD-10-CM | POA: Diagnosis not present

## 2022-04-16 DIAGNOSIS — Z5112 Encounter for antineoplastic immunotherapy: Secondary | ICD-10-CM | POA: Insufficient documentation

## 2022-04-16 DIAGNOSIS — I5021 Acute systolic (congestive) heart failure: Secondary | ICD-10-CM | POA: Diagnosis not present

## 2022-04-16 DIAGNOSIS — C4339 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: Secondary | ICD-10-CM

## 2022-04-16 DIAGNOSIS — E538 Deficiency of other specified B group vitamins: Secondary | ICD-10-CM | POA: Insufficient documentation

## 2022-04-16 DIAGNOSIS — D649 Anemia, unspecified: Secondary | ICD-10-CM | POA: Diagnosis not present

## 2022-04-16 DIAGNOSIS — D539 Nutritional anemia, unspecified: Secondary | ICD-10-CM | POA: Diagnosis not present

## 2022-04-16 LAB — COMPREHENSIVE METABOLIC PANEL
Albumin: 4.3 (ref 3.5–5.0)
Calcium: 8.8 (ref 8.7–10.7)

## 2022-04-16 LAB — BASIC METABOLIC PANEL
BUN: 23 — AB (ref 4–21)
CO2: 28 — AB (ref 13–22)
Chloride: 105 (ref 99–108)
Creatinine: 1.5 — AB (ref 0.6–1.3)
Glucose: 205
Potassium: 4.4 mEq/L (ref 3.5–5.1)
Sodium: 144 (ref 137–147)

## 2022-04-16 LAB — HEPATIC FUNCTION PANEL
ALT: 24 U/L (ref 10–40)
AST: 23 (ref 14–40)
Alkaline Phosphatase: 59 (ref 25–125)
Bilirubin, Total: 0.6

## 2022-04-16 LAB — CBC AND DIFFERENTIAL
HCT: 33 — AB (ref 41–53)
Hemoglobin: 10.3 — AB (ref 13.5–17.5)
Neutrophils Absolute: 2.78
Platelets: 138 10*3/uL — AB (ref 150–400)
WBC: 3.7

## 2022-04-16 LAB — TSH: TSH: 3.634 u[IU]/mL (ref 0.350–4.500)

## 2022-04-16 LAB — CBC: RBC: 3.47 — AB (ref 3.87–5.11)

## 2022-04-16 NOTE — Progress Notes (Signed)
[place]  [address] 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 CT neck and chest in June did not reveal any evidence of progressive disease. CT imaging from February 2023 remains without evidence of metastatic disease. His disease remains under control and so we put treatment on hold earlier this year until his cardiorespiratory issues were addressed.  He is doing better and so we resumed treatment.  Macrocytic anemia Anemia, despite monthly B12 injections. He has missed some doses, and his B12 level was low at 158 when I rechecked it in April.  He is back on injections now.  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 Findings are most consistent with an infectious or inflammatory process in a pattern suggestive of atypical infection including nontuberculous mycobacterium.  He was seen by Dr. [name], who recommended bronchoscopy, but the patient did not return for follow-up as he does not wish to pursue this procedure.  His symptoms are not severe at this time.  Plan: He improved clinically and so we resumed his monthly treatments with nivolumab.  I also resumed his B12 injections monthly.  He is back on daily Lasix and is taking his medications. We will plan to see him back in 4 weeks with a CBC, comprehensive metabolic panel, magnesium, and TSH for repeat evaluation. His last scans were in February so would likely be due again for CT of head, neck and chest at 6 months. The patient understands the plans discussed today and is in agreement.  He knows to contact our office if he develops concerns prior to his next appointment.  I provided  15 minutes of face-to-face time during this this encounter and > 50% was spent counseling as documented under my assessment and plan.     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 ([place])  01/24/2015 Cancer Staging   Staging form: Melanoma of the Skin, AJCC 8th Edition - Clinical stage from 01/24/2015: Stage IIB (cT3b, cN0, cM0) - Signed by [name], MD on 08/19/2021 Histopathologic type: Malignant melanoma, NOS (except juvenile melanoma [id number]) Stage prefix: Initial diagnosis Laterality: Right Lymph-vascular invasion (LVI): LVI not present (absent)/not identified Diagnostic confirmation: Positive histology Specimen type: Excision Staged by: Managing physician Mitotic count: 10 Mitotic unit: mm2 Clark's level: Level IV Tumor-infiltrating lymphocytes: Unknown Neurotropism: Absent Presence of extranodal extension: Absent Breslow depth (mm): 40 Ulceration of the epidermis: Yes Microsatellites: No Primary tumor regression: Unknown Sentinel lymph node biopsy performed: No Matted nodes: No Prognostic indicators: Wide excision negative Stage used in treatment planning: Yes National guidelines used in treatment planning: Yes Type of national guideline used in treatment planning: NCCN   07/18/2020 -  Chemotherapy   Patient is on Treatment Plan : MELANOMA Nivolumab + B12 q28d     07/29/2020 Initial Diagnosis   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  His original lesion was diagnosed in March 2016.  He underwent excision, but refused aggressive surgery.  The lesion was at least  4 mm thick with a Clark's level 4, and a mitotic index 10/mm.  The margins were positive, but wide reexcision revealed no residual melanoma.  He had recurrence within 2 months and the lesion  had been steadily growing.  He initially had refused seeing a medical oncologist, but finally came to [place] for evaluation and treatment in October 2016.  He does have extensive comorbidities including COPD, diabetes, hypertension, coronary artery disease, history of myocardial infarction, and pacemaker with AICD.  PET revealed hypermetabolism in the right submandibular node, but no evidence of distant metastasis.  He was having significant pain and requiring regular hydrocodone doses.  He also had severe disfigurement and avoided going out in public because of the facial lesion.  He has been receiving palliative nivolumab since November 2016 and has had a good response with a decrease in the right facial tumor.  He has had associated hypomagnesemia and hypokalemia, so is on oral magnesium and potassium supplements. He has had mild chronic anemia, which has been stable. He was found to have B12 deficiency and continues B12 injections monthly.  CT head/neck in May 2017 revealed some nonspecific skin thickening in the right face in the area of his melanoma in the right submandibular lymph node had decreased in size, now measuring 6 x 12 mm, but still prominent when compared to the left submandibular node.  He had 15 cycles of nivolumab and relocated to continue to get the same treatment.  He had his 30th dose in February 2018 and then returned to the [place] area.     He was admitted to the hospital in early March 2018 for congestive heart failure associated with pleural and pericardial effusions.  He had no leukocytosis, fever or purulent sputum.  The echocardiogram revealed severe diffuse hypokinesis with mild concentric hypertrophy and markedly depressed ejection fraction of 20 to 25%, associated with moderate mitral regurgitation and moderate left atrial dilatation.  He improved with diuresis.  CT chest in March 2018 revealed stable cardiomegaly and stable small pericardial effusion, but no evidence of immune  mediated pneumonitis or metastatic disease.  The previously seen bilateral pleural effusions had resolved.  He returned to our care and nivolumab every 2 weeks was resumed in April.  CT chest was repeated in early May after 1 month back on therapy.  This was stable, without evidence of recurrent pleural effusions, immunotherapy toxicities, or evidence of metastatic disease.  He was switched to every 28-day nivolumab in August 2018.  He has had stable disease so has continued on nivolumab.  CT imaging in October 2018 revealed a stable 5 mm level 1B cervical lymph node, as well as a stable 8 mm exophytic lesion of the right face.  CT chest did not reveal any evidence of metastatic disease. Borderline cardiomegaly and a small amount of pericardial fluid/thickening was seen, but not reaccumulation of the pleural effusions.  CT head did not reveal any evidence of intracranial metastasis.  There were chronic ischemic changes and evidence of old infarcts.  CT neck and chest in February 2019 were stable.  He was [facility] to [facility] in May 2019 twice with severe hypoglycemia.  Repeat imaging in May did not reveal any progressive disease.  His nivolumab was held several times that year unfortunately, his wife passed away in 06-15-2018 and she was his primary caregiver.  His daughters have been caring for him since then.  CT neck and chest in November 2019 did not reveal any evidence of recurrent or  metastatic disease.  CT imaging in January 2020 did not reveal evidence of recurrence, so he has continued nivolumab every 4 weeks.  He was given mirtazapine for depression and weight loss the dose was increased to 15 mg.  CT scans in June 2020 remained stable with a small pericardial effusion and bilateral renal cysts. CT neck, chest and abdomen in November 2020 did not reveal any evidence of metastasis. CT head, neck and chest from May and October 2021 were negative for evidence of metastatic disease.  He was then lost to  follow-up from December 2021 to April 2022, at which time he resumed nivolumab and B12 every 4 weeks.   CT head, neck and chest in June of 2022 did not reveal any evidence of progressive disease.He was admitted at [facility] in November/December of 2022 due to flu and pneumonia.   INTERVAL HISTORY:  [place] is here for follow-up after resuming his treatment after being on hold for 2 months.  He declined further evaluation of his lung findings and improved clinically. He did have a fall out of the tub but denies serious injury.  He denies shortness of breath except with exertion. He does have lower extremity edema, but has been taking his Lasix and seems to be feeling better.  He resumed his therapy and had no difficulties.  He has an appointment to follow up with cardiology on 6/16.  Hemoglobin has increased from 9.7 to 10.3, and his white count has decreased to 3.7 with an ANC of 2700, and platelets are mildly low at 138,000. Chemistries reveal a creatinine of 1.5, up from 1.2, with a BUN of 23. His  appetite is good, and his weight is stable since his last visit. He denies fever, chills or other signs of infection.  He denies nausea, vomiting, bowel issues, or abdominal pain.  He denies sore throat, cough, dyspnea, or chest pain.   REVIEW OF SYSTEMS:  Review of Systems  Constitutional: Negative.  Negative for appetite change, chills, fatigue, fever and unexpected weight change.  HENT:  Negative.    Eyes: Negative.   Respiratory: Negative.  Negative for chest tightness, cough, hemoptysis, shortness of breath and wheezing.   Cardiovascular:  Negative for chest pain, leg swelling (lower extremity edema) and palpitations.  Gastrointestinal: Negative.  Negative for abdominal distention, abdominal pain, blood in stool, constipation, diarrhea, nausea and vomiting.  Endocrine: Negative.   Genitourinary: Negative.  Negative for difficulty urinating, dysuria, frequency and hematuria.   Musculoskeletal:  Negative.  Negative for arthralgias, back pain, flank pain, gait problem and myalgias.  Skin: Negative.   Neurological: Negative.  Negative for dizziness, extremity weakness, gait problem, headaches, light-headedness, numbness, seizures and speech difficulty.  Hematological: Negative.   Psychiatric/Behavioral: Negative.  Negative for depression and sleep disturbance. The patient is not nervous/anxious.   All other systems reviewed and are negative.   Sob w/exertion VITALS:  Blood pressure (!) 112/57, pulse 66, temperature (!) 97 F (36.1 C), temperature source Oral, resp. rate 18, height _0  (1.702 m), weight 167 lb (75.8 kg), SpO2 98 %.  Wt Readings from Last 3 Encounters:  04/28/22 171 lb 12.8 oz (77.9 kg)  04/18/22 168 lb (76.2 kg)  04/16/22 167 lb (75.8 kg)    Body mass index is 26.16 kg/m.  Performance status (ECOG): 1 - Symptomatic but completely ambulatory  PHYSICAL EXAM:  Physical Exam Constitutional:      General: He is not in acute distress.    Appearance: Normal appearance. He is  normal weight.  HENT:     Head: Normocephalic and atraumatic.     Comments: Facial edema.  Eyes:     General: No scleral icterus.    Extraocular Movements: Extraocular movements intact.     Conjunctiva/sclera: Conjunctivae normal.     Pupils: Pupils are equal, round, and reactive to light.  Cardiovascular:     Rate and Rhythm: Normal rate and regular rhythm.     Pulses: Normal pulses.     Heart sounds: Normal heart sounds. No murmur heard.    No friction rub. No gallop.  Pulmonary:     Effort: Pulmonary effort is normal. No respiratory distress.     Breath sounds: Rales: bibasilar.  Abdominal:     General: Bowel sounds are normal. There is no distension.     Palpations: Abdomen is soft. There is no hepatomegaly, splenomegaly or mass.     Tenderness: There is no abdominal tenderness.  Musculoskeletal:        General: Normal range of motion.     Cervical back: Normal range of motion  and neck supple.     Right lower leg: 1+ Edema present.     Left lower leg: 1+ Edema present.  Lymphadenopathy:     Cervical: No cervical adenopathy.  Skin:    General: Skin is warm and dry.     Comments: [place] of the right cheek which is stable.  Neurological:     General: No focal deficit present.     Mental Status: He is alert and oriented to person, place, and time. Mental status is at baseline.  Psychiatric:        Mood and Affect: Mood normal.        Behavior: Behavior normal.        Thought Content: Thought content normal.        Judgment: Judgment normal.     LABS:      Latest Ref Rng & Units 04/16/2022   12:00 AM 03/19/2022   12:00 AM 02/12/2022   12:00 AM  CBC  WBC  3.7     3.5     3.8      Hemoglobin 13.5 - 17.5 10.3     9.7     9.9      Hematocrit 41 - 53 33     31     31      Platelets 150 - 400 K/uL 138     164     156         This result is from an external source.      Latest Ref Rng & Units 04/16/2022   12:00 AM 03/19/2022    4:51 PM 03/19/2022   12:00 AM  CMP  BUN 4 - _0 Creatinine 0.6 - 1.3 1.5      1.2      Sodium 137 - 147 144      142      Potassium 3.5 - 5.1 mEq/L 4.4      4.2      Chloride 99 - 108 105      105      CO2 13 - _1 Calcium 8.7 - 10.7 8.8     8.8       Alkaline Phos 25 - 125 59      71  AST 14 - 40 23      23      ALT 10 - 40 U/L 24      21         This result is from an external source.    STUDIES:  No results found.   HISTORY:   Allergies: No Known Allergies  Current Medications: Current Outpatient Medications  Medication Sig Dispense Refill   ALBUTEROL SULFATE PO Take by mouth.     amiodarone (PACERONE) 200 MG tablet Take 1 tablet (200 mg total) by mouth daily. 90 tablet 3   apixaban (ELIQUIS) 5 MG TABS tablet Take 1 tablet (5 mg total) by mouth 2 (two) times daily. 180 tablet 3   atorvastatin (LIPITOR) 80 MG tablet Take 1 tablet (80 mg total) by mouth at bedtime. 90 tablet 3    furosemide (LASIX) 20 MG tablet Take 1 tablet (20 mg total) by mouth every other day. 15 tablet 3   magnesium oxide (MAG-OX) 400 (240 Mg) MG tablet Take 1 tablet (400 mg total) by mouth 2 (two) times daily. 60 tablet 5   metFORMIN (GLUCOPHAGE) 1000 MG tablet Take 500 mg by mouth 2 (two) times daily.     metoprolol succinate (TOPROL-XL) 25 MG 24 hr tablet Take 0.5 tablets (12.5 mg total) by mouth daily. 45 tablet 3   ondansetron (ZOFRAN-ODT) 4 MG disintegrating tablet Take 1 tablet by mouth daily as needed.     potassium chloride SA (KLOR-CON M) 20 MEQ tablet Take 1 tablet (20 mEq total) by mouth daily. 90 tablet 3   sacubitril-valsartan (ENTRESTO) 24-26 MG Take 0.5 tablets by mouth 2 (two) times daily. 90 tablet 3   tamsulosin (FLOMAX) 0.4 MG CAPS capsule Take by mouth.     No current facility-administered medications for this visit.

## 2022-04-17 LAB — T4: T4, Total: 9.9 ug/dL (ref 4.5–12.0)

## 2022-04-18 ENCOUNTER — Inpatient Hospital Stay: Payer: Medicare PPO

## 2022-04-18 VITALS — BP 132/64 | HR 78 | Temp 98.2°F | Resp 18 | Wt 168.0 lb

## 2022-04-18 DIAGNOSIS — E538 Deficiency of other specified B group vitamins: Secondary | ICD-10-CM | POA: Diagnosis not present

## 2022-04-18 DIAGNOSIS — C4339 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: Secondary | ICD-10-CM

## 2022-04-18 DIAGNOSIS — Z79899 Other long term (current) drug therapy: Secondary | ICD-10-CM | POA: Diagnosis not present

## 2022-04-18 DIAGNOSIS — Z5112 Encounter for antineoplastic immunotherapy: Secondary | ICD-10-CM | POA: Diagnosis not present

## 2022-04-18 DIAGNOSIS — D539 Nutritional anemia, unspecified: Secondary | ICD-10-CM | POA: Diagnosis not present

## 2022-04-18 MED ORDER — CYANOCOBALAMIN 1000 MCG/ML IJ SOLN
1000.0000 ug | Freq: Once | INTRAMUSCULAR | Status: AC
  Administered 2022-04-18: 1000 ug via INTRAMUSCULAR
  Filled 2022-04-18: qty 1

## 2022-04-18 MED ORDER — SODIUM CHLORIDE 0.9 % IV SOLN
480.0000 mg | Freq: Once | INTRAVENOUS | Status: AC
  Administered 2022-04-18: 480 mg via INTRAVENOUS
  Filled 2022-04-18: qty 48

## 2022-04-18 MED ORDER — SODIUM CHLORIDE 0.9 % IV SOLN: Freq: Once | INTRAVENOUS | Status: AC

## 2022-04-18 NOTE — Patient Instructions (Signed)
Nivolumab injection °What is this medication? °NIVOLUMAB (nye VOL ue mab) is a monoclonal antibody. It treats certain types of cancer. Some of the cancers treated are colon cancer, head and neck cancer, Hodgkin lymphoma, lung cancer, and melanoma. °This medicine may be used for other purposes; ask your health care provider or pharmacist if you have questions. °COMMON BRAND NAME(S): Opdivo °What should I tell my care team before I take this medication? °They need to know if you have any of these conditions: °Autoimmune diseases such as Crohn's disease, ulcerative colitis, or lupus °Have had or planning to have an allogeneic stem cell transplant (uses someone else's stem cells) °History of chest radiation °Organ transplant °Nervous system problems such as myasthenia gravis or Guillain-Barre syndrome °An unusual or allergic reaction to nivolumab, other medicines, foods, dyes, or preservatives °Pregnant or trying to get pregnant °Breast-feeding °How should I use this medication? °This medication is injected into a vein. It is given in a hospital or clinic setting. °A special MedGuide will be given to you before each treatment. Be sure to read this information carefully each time. °Talk to your care team regarding the use of this medication in children. While it may be prescribed for children as young as 12 years for selected conditions, precautions do apply. °Overdosage: If you think you have taken too much of this medicine contact a poison control center or emergency room at once. °NOTE: This medicine is only for you. Do not share this medicine with others. °What if I miss a dose? °Keep appointments for follow-up doses. It is important not to miss your dose. Call your care team if you are unable to keep an appointment. °What may interact with this medication? °Interactions have not been studied. °This list may not describe all possible interactions. Give your health care provider a list of all the medicines, herbs,  non-prescription drugs, or dietary supplements you use. Also tell them if you smoke, drink alcohol, or use illegal drugs. Some items may interact with your medicine. °What should I watch for while using this medication? °Your condition will be monitored carefully while you are receiving this medication. °You may need blood work done while you are taking this medication. °Do not become pregnant while taking this medication or for 5 months after stopping it. Women should inform their care team if they wish to become pregnant or think they might be pregnant. There is a potential for serious harm to an unborn child. Talk to your care team for more information. Do not breast-feed an infant while taking this medication or for 5 months after stopping it. °What side effects may I notice from receiving this medication? °Side effects that you should report to your care team as soon as possible: °Allergic reactions--skin rash, itching, hives, swelling of the face, lips, tongue, or throat °Bloody or black, tar-like stools °Change in vision °Chest pain °Diarrhea °Dry cough, shortness of breath or trouble breathing °Eye pain °Fast or irregular heartbeat °Fever, chills °High blood sugar (hyperglycemia)--increased thirst or amount of urine, unusual weakness or fatigue, blurry vision °High thyroid levels (hyperthyroidism)--fast or irregular heartbeat, weight loss, excessive sweating or sensitivity to heat, tremors or shaking, anxiety, nervousness, irregular menstrual cycle or spotting °Kidney injury--decrease in the amount of urine, swelling of the ankles, hands, or feet °Liver injury--right upper belly pain, loss of appetite, nausea, light-colored stool, dark yellow or brown urine, yellowing skin or eyes, unusual weakness or fatigue °Low red blood cell count--unusual weakness or fatigue, dizziness, headache, trouble breathing °  Low thyroid levels (hypothyroidism)--unusual weakness or fatigue, increased sensitivity to cold,  constipation, hair loss, dry skin, weight gain, feelings of depression °Mood and behavior changes-confusion, change in sex drive or performance, irritability °Muscle pain or cramps °Pain, tingling, or numbness in the hands or feet, muscle weakness, trouble walking, loss of balance or coordination °Red or dark brown urine °Redness, blistering, peeling, or loosening of the skin, including inside the mouth °Stomach pain °Unusual bruising or bleeding °Side effects that usually do not require medical attention (report to your care team if they continue or are bothersome): °Bone pain °Constipation °Loss of appetite °Nausea °Tiredness °Vomiting °This list may not describe all possible side effects. Call your doctor for medical advice about side effects. You may report side effects to FDA at 1-800-FDA-1088. °Where should I keep my medication? °This medication is given in a hospital or clinic and will not be stored at home. °NOTE: This sheet is a summary. It may not cover all possible information. If you have questions about this medicine, talk to your doctor, pharmacist, or health care provider. °© 2022 [employer] (2021-07-16 00:00:00) ° °

## 2022-04-20 ENCOUNTER — Other Ambulatory Visit: Payer: Self-pay | Admitting: Student

## 2022-04-21 NOTE — Progress Notes (Signed)
External labs 04/16/2022: Hgb 10.3, HCT 32.5, MCV 94, platelet 138 Sodium 144, potassium 4.4, BUN 23, creatinine 1.5, GFR 45,

## 2022-04-25 ENCOUNTER — Ambulatory Visit: Payer: Medicare PPO | Admitting: Student

## 2022-04-28 ENCOUNTER — Encounter: Payer: Self-pay | Admitting: Student

## 2022-04-28 ENCOUNTER — Ambulatory Visit: Payer: Medicare PPO | Admitting: Student

## 2022-04-28 VITALS — BP 123/55 | HR 33 | Temp 98.1°F | Resp 16 | Ht 67.0 in | Wt 171.8 lb

## 2022-04-28 DIAGNOSIS — I4819 Other persistent atrial fibrillation: Secondary | ICD-10-CM | POA: Diagnosis not present

## 2022-04-28 DIAGNOSIS — I5022 Chronic systolic (congestive) heart failure: Secondary | ICD-10-CM | POA: Diagnosis not present

## 2022-04-28 DIAGNOSIS — Z95810 Presence of automatic (implantable) cardiac defibrillator: Secondary | ICD-10-CM

## 2022-04-28 MED ORDER — METOPROLOL SUCCINATE ER 25 MG PO TB24: 12.5000 mg | ORAL_TABLET | Freq: Every day | ORAL | 3 refills | Status: DC

## 2022-04-28 MED ORDER — FUROSEMIDE 20 MG PO TABS: 20.0000 mg | ORAL_TABLET | ORAL | 3 refills | Status: DC

## 2022-04-28 NOTE — Progress Notes (Signed)
Primary Physician/Referring:  [name], PA-C  Patient ID: Kevin [name], male    DOB: Mar 21, 1941, 81 y.o.   MRN: [id number]  Chief Complaint  Patient presents with   Atrial Fibrillation   Follow-up    3 months   HPI:    Kevin [name]  is a 81 y.o. Caucasian male  with hypertension, type 2 DM, COPD, nicotine dependence, CAD s/p ?MI/PCI (2015), ischemic cardiomyopathy with previously known LVEF 25%, s/p dual chamber ICD ([employer] Jude), h/o atrial tachycardia, melanoma-currently on monthly chemotherapy.   Patient patient was admitted 10/06/2021 with atrial fibrillation with RVR and acute on chronic HFrEF, patient was started on guideline directed medical therapy and diuresed, subsequently discharged 10/11/2021.  Patient readmitted 10/17/2021 - 10/21/2021 with aspiration pneumonia, COPD exacerbation, and AKI, therefore Jardiance and digoxin were discontinued at that time.  Patient was last seen in the office 01/31/22 at which time no changes were made to medications and advised follow up with palliative care team. Patient continue to follow closely with oncology and has resumed chemotherapy treatments. He now presents for 3 month follow up. Patient reports ongoing bilateral leg edema, he has therefore been taking Lasix 20 mg daily with additional doses 1-2 times per week. Otherwise his primary concern is continued general fatigue which is unchanged compared to previous. Blood pressure is overall well controlled, however he does continue to have episodes of hypotension.  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)    Malignant melanoma of skin of cheek  (external) ([place])    Past Surgical History:  Procedure Laterality Date   APPENDECTOMY     CARDIAC CATHETERIZATION     CORONARY ANGIOPLASTY     PACEMAKER INSERTION     RIGHT/LEFT HEART CATH AND CORONARY ANGIOGRAPHY N/A 10/09/2021   Procedure: RIGHT/LEFT HEART CATH AND CORONARY ANGIOGRAPHY;  Surgeon: Nigel Mormon, MD;  Location: [facility];  Service: Cardiovascular;  Laterality: N/A;   Family History  Problem Relation Age of Onset   Breast cancer Mother    Diabetes Mellitus II Father    Ovarian cancer Sister    Cancer Brother    Cancer Brother    Cancer Maternal Uncle    Breast cancer Niece     Social History   Tobacco Use   Smoking status: Some Days    Packs/day: 0.50    Years: 60.00    Total pack years: 30.00    Types: Cigarettes   Smokeless tobacco: Never  Substance Use Topics   Alcohol use: Not Currently   Marital Status: Married   ROS  Review of Systems  Constitutional: Positive for malaise/fatigue. Negative for weight gain.  Cardiovascular:  Positive for dyspnea on exertion (uses supplemental oxygen) and leg swelling (stable). Negative for chest pain, claudication, near-syncope, orthopnea, palpitations, paroxysmal nocturnal dyspnea and syncope.  Genitourinary:  Positive for dysuria.  Neurological:  Negative for dizziness.    Objective  Blood pressure (!) 123/55, pulse (!) 33, temperature 98.1 F (36.7 C), temperature source Temporal, resp. rate 16, height '5\' 7"'$  (1.702 m), weight 171 lb 12.8 oz (77.9 kg), SpO2 97 %.  04/28/2022    2:39 PM 04/18/2022    2:02 PM 04/16/2022    4:32 PM  Vitals with BMI  Height '5\' 7"'$   '5\' 7"'$   Weight 171 lbs 13 oz 168 lbs 167 lbs  BMI 26.9 98.92 11.94  Systolic 174 081 448  Diastolic 55 64 57  Pulse 33 78 66      Physical Exam Vitals reviewed.  Constitutional:      Appearance: He is ill-appearing.     Comments: Frail  Cardiovascular:     Rate and Rhythm: Normal rate and regular rhythm.     Pulses: Intact  distal pulses. Decreased pulses.     Heart sounds: S1 normal and S2 normal. Murmur heard.     Low-pitched rumbling diastolic murmur is present at the apex.     No gallop.  Pulmonary:     Effort: Pulmonary effort is normal. No respiratory distress.     Breath sounds: No wheezing, rhonchi or rales.     Comments: No supplemental oxygen Musculoskeletal:     Right lower leg: Edema (2+ pitting to knee) present.     Left lower leg: Edema (2+ pitting to knee) present.  Neurological:     Mental Status: He is alert.     Laboratory examination:   Recent Labs    10/19/21 0132 10/20/21 0029 10/21/21 0036 12/13/21 0000 02/04/22 1152 02/12/22 0000 03/19/22 0000 03/19/22 1651 04/16/22 0000  NA 135 138 139   < > 146* 142 142  --  144  K 4.2 4.0 3.9   < > 3.2* 3.5 4.2  --  4.4  CL 98 101 97*   < > 102 104 105  --  105  CO2 30 32 35*   < > 32* 32* 24*  --  28*  GLUCOSE 190* 213* 168*  --  156*  --   --   --   --   BUN 43* 28* 23   < > '12 17 21  '$ --  23*  CREATININE 1.39* 1.35* 1.33*   < > 1.15 1.0 1.2  --  1.5*  CALCIUM 8.2* 8.3* 8.2*   < > 9.0 8.2*  --  8.8 8.8  GFRNONAA 52* 53* 54*  --   --   --   --   --   --    < > = values in this interval not displayed.   estimated creatinine clearance is 36.7 mL/min (A) (by C-G formula based on SCr of 1.5 mg/dL (A)).     Latest Ref Rng & Units 04/16/2022   12:00 AM 03/19/2022    4:51 PM 03/19/2022   12:00 AM  CMP  BUN 4 - '21 23      21      '$ Creatinine 0.6 - 1.3 1.5      1.2      Sodium 137 - 147 144      142      Potassium 3.5 - 5.1 mEq/L 4.4      4.2      Chloride 99 - 108 105      105      CO2 13 - '22 28      24      '$ Calcium 8.7 - 10.7 8.8     8.8       Alkaline Phos 25 - 125 59      71      AST 14 - 40 23      23  ALT 10 - 40 U/L 24      21         This result is from an external source.      Latest Ref Rng & Units 04/16/2022   12:00 AM 03/19/2022   12:00 AM 02/12/2022   12:00 AM  CBC  WBC  3.7     3.5     3.8      Hemoglobin 13.5  - 17.5 10.3     9.7     9.9      Hematocrit 41 - 53 33     31     31      Platelets 150 - 400 K/uL 138     164     156         This result is from an external source.    Lipid Panel No results for input(s): "CHOL", "TRIG", "[place]", "VLDL", "HDL", "CHOLHDL", "LDLDIRECT" in the last 8760 hours.  HEMOGLOBIN A1C Lab Results  Component Value Date   HGBA1C 6.7 (H) 10/07/2021   MPG 146 10/07/2021   TSH Recent Labs    02/12/22 1522 03/19/22 1548 04/16/22 1428  TSH 2.532 3.128 3.634    External labs:  10/31/2021: BUN 17, creatinine 1.09, GFR 69, sodium 142, potassium 4.7 BNP 396.8 Hemoglobin 10.2, hematocrit 31.8, MCV 94, platelet 177  Allergies  No Known Allergies   Medications Prior to Visit:   Outpatient Medications Prior to Visit  Medication Sig Dispense Refill   ALBUTEROL SULFATE PO Take by mouth.     amiodarone (PACERONE) 200 MG tablet Take 1 tablet (200 mg total) by mouth daily. 90 tablet 3   apixaban (ELIQUIS) 5 MG TABS tablet Take 1 tablet (5 mg total) by mouth 2 (two) times daily. 180 tablet 3   atorvastatin (LIPITOR) 80 MG tablet Take 1 tablet (80 mg total) by mouth at bedtime. 90 tablet 3   magnesium oxide (MAG-OX) 400 (240 Mg) MG tablet Take 1 tablet (400 mg total) by mouth 2 (two) times daily. 60 tablet 5   metFORMIN (GLUCOPHAGE) 1000 MG tablet Take 500 mg by mouth 2 (two) times daily.     ondansetron (ZOFRAN-ODT) 4 MG disintegrating tablet Take 1 tablet by mouth daily as needed.     potassium chloride SA (KLOR-CON M) 20 MEQ tablet Take 1 tablet (20 mEq total) by mouth daily. 90 tablet 3   sacubitril-valsartan (ENTRESTO) 24-26 MG Take 0.5 tablets by mouth 2 (two) times daily. 90 tablet 3   tamsulosin (FLOMAX) 0.4 MG CAPS capsule Take by mouth.     furosemide (LASIX) 20 MG tablet Take 1 tablet (20 mg total) by mouth daily as needed for fluid or edema. (Patient taking differently: Take 20 mg by mouth daily.) 14 tablet 1   metoprolol succinate (TOPROL-XL) 25 MG 24  hr tablet TAKE 1/2 TABLET BY MOUTH DAILY. 45 tablet 1   pioglitazone (ACTOS) 30 MG tablet Take 30 mg by mouth daily.     No facility-administered medications prior to visit.   Final Medications at End of Visit    Current Meds  Medication Sig   ALBUTEROL SULFATE PO Take by mouth.   amiodarone (PACERONE) 200 MG tablet Take 1 tablet (200 mg total) by mouth daily.   apixaban (ELIQUIS) 5 MG TABS tablet Take 1 tablet (5 mg total) by mouth 2 (two) times daily.   atorvastatin (LIPITOR) 80 MG tablet Take 1 tablet (80 mg total) by mouth at bedtime.   magnesium oxide (  MAG-OX) 400 (240 Mg) MG tablet Take 1 tablet (400 mg total) by mouth 2 (two) times daily.   metFORMIN (GLUCOPHAGE) 1000 MG tablet Take 500 mg by mouth 2 (two) times daily.   ondansetron (ZOFRAN-ODT) 4 MG disintegrating tablet Take 1 tablet by mouth daily as needed.   potassium chloride SA (KLOR-CON M) 20 MEQ tablet Take 1 tablet (20 mEq total) by mouth daily.   sacubitril-valsartan (ENTRESTO) 24-26 MG Take 0.5 tablets by mouth 2 (two) times daily.   tamsulosin (FLOMAX) 0.4 MG CAPS capsule Take by mouth.   [DISCONTINUED] furosemide (LASIX) 20 MG tablet Take 1 tablet (20 mg total) by mouth daily as needed for fluid or edema. (Patient taking differently: Take 20 mg by mouth daily.)   [DISCONTINUED] metoprolol succinate (TOPROL-XL) 25 MG 24 hr tablet TAKE 1/2 TABLET BY MOUTH DAILY.   Radiology:   No results found.  Cardiac Studies:  PCV ECHOCARDIOGRAM COMPLETE 01/02/2022 Left ventricle cavity is moderately dilated. Normal left ventricular wall thickness. Abnormal septal wall motion due to right ventricle pacemaker. Severe global hypokinesis. LVEF <20%. No evidence of LV thrombus on this noncontrast study. Doppler evidence of grade II (pseudonormal) diastolic dysfunction, elevated LAP. Calculated EF 29%. Left atrial cavity is mildly dilated. Structurally normal trileaflet aortic valve.  Moderate (Grade II) aortic  regurgitation. Structurally normal mitral valve.  Moderate to severe mitral regurgitation. Structurally normal tricuspid valve.  Mild to moderate tricuspid regurgitation. Estimated pulmonary artery systolic pressure 55 mmHg. Small pericardial effusion most adjacent to right atrium. There is no hemodynamic significance. Previous study on 10/07/2021 reported mild MR. No other significant change noted.  Right/left heart catheterization and coronary angiography 10/09/2021: LM: Normal LAD: [place] mid LAD stent. No significant disease Lcx: No significant disease RCA: Ostial 100% occlusion. Left-to-right collateral up to mid RCA   RA: 11 mmHg RV: 51/0 mmHg PA: 64/22 mmHg, mPAP 34 mmHg PCW: 23 mmHg   CO: 3.6 L/min CI: 2.0 L/min/m2   Impression: Mildly decompensated nonischemic cardiomyopathy LV size and function out of proportion to RCA occlusion and patent mid LAD stent Recommend GDMT for heart failure  EKG:  01/31/2022 sinus rhythm with left axis and left bundle branch block, no further analysis.  10/25/2021: Ventricular paced rhythm at a rate of 70 bpm.  No further analysis.  10/17/2021: Atrial Fibrillation w/ PVCs, 76 bpm   Assessment     ICD-10-CM   1. Chronic systolic heart failure (HCC)  I50.22     2. Persistent atrial fibrillation (HCC)  I48.19 EKG 12-Lead    3. ICD (implantable cardioverter-defibrillator) in place  Z95.810        Medications Discontinued During This Encounter  Medication Reason   pioglitazone (ACTOS) 30 MG tablet    furosemide (LASIX) 20 MG tablet Reorder   metoprolol succinate (TOPROL-XL) 25 MG 24 hr tablet      Meds ordered this encounter  Medications   furosemide (LASIX) 20 MG tablet    Sig: Take 1 tablet (20 mg total) by mouth every other day.    Dispense:  15 tablet    Refill:  3   metoprolol succinate (TOPROL-XL) 25 MG 24 hr tablet    Sig: Take 0.5 tablets (12.5 mg total) by mouth daily.    Dispense:  45 tablet    Refill:  3     Recommendations:   Kevin [name] is a 81 y.o. Caucasian male  with hypertension, type 2 DM, COPD, nicotine dependence, CAD s/p ?MI/PCI (2015), ischemic cardiomyopathy with previously known  LVEF 25%, s/p dual chamber ICD ([employer] Jude), h/o atrial tachycardia, melanoma-currently on monthly chemotherapy.   Patient patient was admitted 10/06/2021 with atrial fibrillation with RVR and acute on chronic HFrEF, patient was started on guideline directed medical therapy and diuresed, subsequently discharged 10/11/2021.  Patient readmitted 10/17/2021 - 10/21/2021 with aspiration pneumonia, COPD exacerbation, and AKI, therefore Jardiance and digoxin were discontinued at that time.  Patient was last seen in the office 01/31/22 at which time no changes were made to medications and advised follow up with palliative care team. Patient continue to follow closely with oncology and has resumed chemotherapy treatments. He now presents for 3 month follow up. Given recently elevated creatinine and history of hypotension will not uptitrate heart failure therapy at this time. Will not start spironolactone or [place].  We will continue metoprolol and Entresto.   Patient admits to very salt intake in his diet. Advised that he elevate his leg whenever possible and counseled at length regarding the DASH diet. Patient appears motivated to make dietary changes. Recommend take lasix 20 mg every other day with additional doses as needed. He is scheduled for repeat BMP in 2-3 weeks with oncology, will defer follow up on creatinine to them.   Follow up in 3 months, sooner if needed. Will set him up for in office ICD check as well.    Alethia Berthold, PA-C 04/28/2022, 3:46 PM Office: [phone]

## 2022-05-14 ENCOUNTER — Inpatient Hospital Stay: Payer: Medicare PPO

## 2022-05-14 ENCOUNTER — Encounter: Payer: Self-pay | Admitting: Oncology

## 2022-05-14 ENCOUNTER — Ambulatory Visit: Payer: Medicare PPO | Admitting: Oncology

## 2022-05-14 NOTE — Progress Notes (Deleted)
[place]  [address] 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 CT neck and chest in June did not reveal any evidence of progressive disease. CT imaging from February 2023 remains without evidence of metastatic disease. His disease remains under control and so we put treatment on hold earlier this year until his cardiorespiratory issues were addressed.  He is doing better and so we resumed treatment.  Macrocytic anemia Anemia, despite monthly B12 injections. He has missed some doses, and his B12 level was low at 158 when I rechecked it in April.  He is back on injections now.  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 Findings are most consistent with an infectious or inflammatory process in a pattern suggestive of atypical infection including nontuberculous mycobacterium.  He was seen by Dr. [name], who recommended bronchoscopy, but the patient did not return for follow-up as he does not wish to pursue this procedure.  His symptoms are not severe at this time.  Plan: He improved clinically and so we resumed his monthly treatments with nivolumab.  I also resumed his B12 injections monthly.  He is back on daily Lasix and is taking his medications. We will plan to see him back in 4 weeks with a CBC, comprehensive metabolic panel, magnesium, and TSH for repeat evaluation. His last scans were in February so would likely be due again for CT of head, neck and chest at 6 months. The patient understands the plans discussed today and is in agreement.  He knows to contact our office if he develops concerns prior to his next appointment.  I provided  15 minutes of face-to-face time during this this encounter and > 50% was spent counseling as documented under my assessment and plan.     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 ([place])  01/24/2015 Cancer Staging   Staging form: Melanoma of the Skin, AJCC 8th Edition - Clinical stage from 01/24/2015: Stage IIB (cT3b, cN0, cM0) - Signed by [name], MD on 08/19/2021 Histopathologic type: Malignant melanoma, NOS (except juvenile melanoma [id number]) Stage prefix: Initial diagnosis Laterality: Right Lymph-vascular invasion (LVI): LVI not present (absent)/not identified Diagnostic confirmation: Positive histology Specimen type: Excision Staged by: Managing physician Mitotic count: 10 Mitotic unit: mm2 Clark's level: Level IV Tumor-infiltrating lymphocytes: Unknown Neurotropism: Absent Presence of extranodal extension: Absent Breslow depth (mm): 40 Ulceration of the epidermis: Yes Microsatellites: No Primary tumor regression: Unknown Sentinel lymph node biopsy performed: No Matted nodes: No Prognostic indicators: Wide excision negative Stage used in treatment planning: Yes National guidelines used in treatment planning: Yes Type of national guideline used in treatment planning: NCCN   07/18/2020 -  Chemotherapy   Patient is on Treatment Plan : MELANOMA Nivolumab + B12 q28d     07/29/2020 Initial Diagnosis   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  His original lesion was diagnosed in March 2016.  He underwent excision, but refused aggressive surgery.  The lesion was at least  4 mm thick with a Clark's level 4, and a mitotic index 10/mm.  The margins were positive, but wide reexcision revealed no residual melanoma.  He had recurrence within 2 months and the lesion  had been steadily growing.  He initially had refused seeing a medical oncologist, but finally came to [place] for evaluation and treatment in October 2016.  He does have extensive comorbidities including COPD, diabetes, hypertension, coronary artery disease, history of myocardial infarction, and pacemaker with AICD.  PET revealed hypermetabolism in the right submandibular node, but no evidence of distant metastasis.  He was having significant pain and requiring regular hydrocodone doses.  He also had severe disfigurement and avoided going out in public because of the facial lesion.  He has been receiving palliative nivolumab since November 2016 and has had a good response with a decrease in the right facial tumor.  He has had associated hypomagnesemia and hypokalemia, so is on oral magnesium and potassium supplements. He has had mild chronic anemia, which has been stable. He was found to have B12 deficiency and continues B12 injections monthly.  CT head/neck in May 2017 revealed some nonspecific skin thickening in the right face in the area of his melanoma in the right submandibular lymph node had decreased in size, now measuring 6 x 12 mm, but still prominent when compared to the left submandibular node.  He had 15 cycles of nivolumab and relocated to continue to get the same treatment.  He had his 30th dose in February 2018 and then returned to the [place] area.     He was admitted to the hospital in early March 2018 for congestive heart failure associated with pleural and pericardial effusions.  He had no leukocytosis, fever or purulent sputum.  The echocardiogram revealed severe diffuse hypokinesis with mild concentric hypertrophy and markedly depressed ejection fraction of 20 to 25%, associated with moderate mitral regurgitation and moderate left atrial dilatation.  He improved with diuresis.  CT chest in March 2018 revealed stable cardiomegaly and stable small pericardial effusion, but no evidence of immune  mediated pneumonitis or metastatic disease.  The previously seen bilateral pleural effusions had resolved.  He returned to our care and nivolumab every 2 weeks was resumed in April.  CT chest was repeated in early May after 1 month back on therapy.  This was stable, without evidence of recurrent pleural effusions, immunotherapy toxicities, or evidence of metastatic disease.  He was switched to every 28-day nivolumab in August 2018.  He has had stable disease so has continued on nivolumab.  CT imaging in October 2018 revealed a stable 5 mm level 1B cervical lymph node, as well as a stable 8 mm exophytic lesion of the right face.  CT chest did not reveal any evidence of metastatic disease. Borderline cardiomegaly and a small amount of pericardial fluid/thickening was seen, but not reaccumulation of the pleural effusions.  CT head did not reveal any evidence of intracranial metastasis.  There were chronic ischemic changes and evidence of old infarcts.  CT neck and chest in February 2019 were stable.  He was [facility] to [facility] in May 2019 twice with severe hypoglycemia.  Repeat imaging in May did not reveal any progressive disease.  His nivolumab was held several times that year unfortunately, his wife passed away in 06-15-2018 and she was his primary caregiver.  His daughters have been caring for him since then.  CT neck and chest in November 2019 did not reveal any evidence of recurrent or  metastatic disease.  CT imaging in January 2020 did not reveal evidence of recurrence, so he has continued nivolumab every 4 weeks.  He was given mirtazapine for depression and weight loss the dose was increased to 15 mg.  CT scans in June 2020 remained stable with a small pericardial effusion and bilateral renal cysts. CT neck, chest and abdomen in November 2020 did not reveal any evidence of metastasis. CT head, neck and chest from May and October 2021 were negative for evidence of metastatic disease.  He was then lost to  follow-up from December 2021 to April 2022, at which time he resumed nivolumab and B12 every 4 weeks.   CT head, neck and chest in June of 2022 did not reveal any evidence of progressive disease.He was admitted at [facility] in November/December of 2022 due to flu and pneumonia.   INTERVAL HISTORY:  Kevin [name] is here for follow-up after resuming his treatment after being on hold for 2 months.  He declined further evaluation of his lung findings and improved clinically. He did have a fall out of the tub but denies serious injury.  He denies shortness of breath except with exertion. He does have lower extremity edema, but has been taking his Lasix and seems to be feeling better.  He resumed his therapy and had no difficulties.  He has an appointment to follow up with cardiology on 6/16.  Hemoglobin has increased from 9.7 to 10.3, and his white count has decreased to 3.7 with an ANC of 2700, and platelets are mildly low at 138,000. Chemistries reveal a creatinine of 1.5, up from 1.2, with a BUN of 23. His  appetite is good, and his weight is stable since his last visit. He denies fever, chills or other signs of infection.  He denies nausea, vomiting, bowel issues, or abdominal pain.  He denies sore throat, cough, dyspnea, or chest pain.   REVIEW OF SYSTEMS:  Review of Systems  Constitutional: Negative.  Negative for appetite change, chills, fatigue, fever and unexpected weight change.  HENT:  Negative.    Eyes: Negative.   Respiratory: Negative.  Negative for chest tightness, cough, hemoptysis, shortness of breath and wheezing.   Cardiovascular:  Negative for chest pain, leg swelling (lower extremity edema) and palpitations.  Gastrointestinal: Negative.  Negative for abdominal distention, abdominal pain, blood in stool, constipation, diarrhea, nausea and vomiting.  Endocrine: Negative.   Genitourinary: Negative.  Negative for difficulty urinating, dysuria, frequency and hematuria.   Musculoskeletal:  Negative.  Negative for arthralgias, back pain, flank pain, gait problem and myalgias.  Skin: Negative.   Neurological: Negative.  Negative for dizziness, extremity weakness, gait problem, headaches, light-headedness, numbness, seizures and speech difficulty.  Hematological: Negative.   Psychiatric/Behavioral: Negative.  Negative for depression and sleep disturbance. The patient is not nervous/anxious.   All other systems reviewed and are negative.   Sob w/exertion VITALS:  There were no vitals taken for this visit.  Wt Readings from Last 3 Encounters:  04/28/22 171 lb 12.8 oz (77.9 kg)  04/18/22 168 lb (76.2 kg)  04/16/22 167 lb (75.8 kg)    There is no height or weight on file to calculate BMI.  Performance status (ECOG): 1 - Symptomatic but completely ambulatory  PHYSICAL EXAM:  Physical Exam Constitutional:      General: He is not in acute distress.    Appearance: Normal appearance. He is normal weight.  HENT:     Head: Normocephalic and atraumatic.     Comments: Facial  edema.  Eyes:     General: No scleral icterus.    Extraocular Movements: Extraocular movements intact.     Conjunctiva/sclera: Conjunctivae normal.     Pupils: Pupils are equal, round, and reactive to light.  Cardiovascular:     Rate and Rhythm: Normal rate and regular rhythm.     Pulses: Normal pulses.     Heart sounds: Normal heart sounds. No murmur heard.    No friction rub. No gallop.  Pulmonary:     Effort: Pulmonary effort is normal. No respiratory distress.     Breath sounds: Rales: bibasilar.  Abdominal:     General: Bowel sounds are normal. There is no distension.     Palpations: Abdomen is soft. There is no hepatomegaly, splenomegaly or mass.     Tenderness: There is no abdominal tenderness.  Musculoskeletal:        General: Normal range of motion.     Cervical back: Normal range of motion and neck supple.     Right lower leg: 1+ Edema present.     Left lower leg: 1+ Edema present.   Lymphadenopathy:     Cervical: No cervical adenopathy.  Skin:    General: Skin is warm and dry.     Comments: [place] of the right cheek which is stable.  Neurological:     General: No focal deficit present.     Mental Status: He is alert and oriented to person, place, and time. Mental status is at baseline.  Psychiatric:        Mood and Affect: Mood normal.        Behavior: Behavior normal.        Thought Content: Thought content normal.        Judgment: Judgment normal.    LABS:      Latest Ref Rng & Units 04/16/2022   12:00 AM 03/19/2022   12:00 AM 02/12/2022   12:00 AM  CBC  WBC  3.7     3.5     3.8      Hemoglobin 13.5 - 17.5 10.3     9.7     9.9      Hematocrit 41 - 53 33     31     31      Platelets 150 - 400 K/uL 138     164     156         This result is from an external source.       Latest Ref Rng & Units 04/16/2022   12:00 AM 03/19/2022    4:51 PM 03/19/2022   12:00 AM  CMP  BUN 4 - _0 Creatinine 0.6 - 1.3 1.5      1.2      Sodium 137 - 147 144      142      Potassium 3.5 - 5.1 mEq/L 4.4      4.2      Chloride 99 - 108 105      105      CO2 13 - _1 Calcium 8.7 - 10.7 8.8     8.8       Alkaline Phos 25 - 125 59      71      AST 14 - 40 23      23      ALT  10 - 40 U/L 24      21         This result is from an external source.     STUDIES:  No results found.   HISTORY:   Allergies: No Known Allergies  Current Medications: Current Outpatient Medications  Medication Sig Dispense Refill  . ALBUTEROL SULFATE PO Take by mouth.    Marland Kitchen amiodarone (PACERONE) 200 MG tablet Take 1 tablet (200 mg total) by mouth daily. 90 tablet 3  . apixaban (ELIQUIS) 5 MG TABS tablet Take 1 tablet (5 mg total) by mouth 2 (two) times daily. 180 tablet 3  . atorvastatin (LIPITOR) 80 MG tablet Take 1 tablet (80 mg total) by mouth at bedtime. 90 tablet 3  . furosemide (LASIX) 20 MG tablet Take 1 tablet (20 mg total) by mouth every other day. 15  tablet 3  . magnesium oxide (MAG-OX) 400 (240 Mg) MG tablet Take 1 tablet (400 mg total) by mouth 2 (two) times daily. 60 tablet 5  . metFORMIN (GLUCOPHAGE) 1000 MG tablet Take 500 mg by mouth 2 (two) times daily.    . metoprolol succinate (TOPROL-XL) 25 MG 24 hr tablet Take 0.5 tablets (12.5 mg total) by mouth daily. 45 tablet 3  . ondansetron (ZOFRAN-ODT) 4 MG disintegrating tablet Take 1 tablet by mouth daily as needed.    . potassium chloride SA (KLOR-CON M) 20 MEQ tablet Take 1 tablet (20 mEq total) by mouth daily. 90 tablet 3  . sacubitril-valsartan (ENTRESTO) 24-26 MG Take 0.5 tablets by mouth 2 (two) times daily. 90 tablet 3  . tamsulosin (FLOMAX) 0.4 MG CAPS capsule Take by mouth.     No current facility-administered medications for this visit.

## 2022-05-16 ENCOUNTER — Other Ambulatory Visit: Payer: Medicare PPO

## 2022-05-16 ENCOUNTER — Ambulatory Visit: Payer: Medicare PPO

## 2022-05-16 ENCOUNTER — Other Ambulatory Visit: Payer: Self-pay

## 2022-05-16 DIAGNOSIS — Z515 Encounter for palliative care: Secondary | ICD-10-CM

## 2022-05-16 DIAGNOSIS — I5021 Acute systolic (congestive) heart failure: Secondary | ICD-10-CM | POA: Diagnosis not present

## 2022-05-16 NOTE — Progress Notes (Signed)
Palliative care SW outreached patient to complete telephonic visit.    Palliative care SW outreached patient to complete telephonic visit. Patients daughter, Kevin [name], provided update on medical condition and/or changes.   Daughter endorses that patient is doing well. Continues to be independent with ADL's and remains at baseline. Continues to be ambulatory.   Hospitalizations: none recently.   Appetite: patients appetite remains fair - good. No significant weight changes noted.  O2: only on O2 at HS 3LPM.   No medication needs or adjustments noted. Patient is compliant with medications.  Tolerating infusions well for malignant melanoma.   Psychosocial assessment: Completed in person at next [facility] home visit. financial, food insecurities or issues with transportation noted during call. No other psychosocial needs. No S/S of depression or anxiety noted.   Daughter open to scheduling in home PC visit. Visit scheduled for 05/22/22 '@130pm'$  with PC RN/SW. Then will schedule for [facility] NP visit.    Palliative care will continue to monitor and assist with long term care planning as needed.

## 2022-05-20 ENCOUNTER — Encounter: Payer: Self-pay | Admitting: Hematology and Oncology

## 2022-05-20 ENCOUNTER — Inpatient Hospital Stay: Payer: Medicare PPO | Attending: Oncology

## 2022-05-20 ENCOUNTER — Inpatient Hospital Stay: Payer: Medicare PPO | Admitting: Hematology and Oncology

## 2022-05-20 VITALS — BP 118/56 | HR 69 | Temp 98.3°F | Resp 18 | Ht 67.0 in | Wt 172.8 lb

## 2022-05-20 DIAGNOSIS — C4339 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: Secondary | ICD-10-CM | POA: Diagnosis not present

## 2022-05-20 DIAGNOSIS — Z5112 Encounter for antineoplastic immunotherapy: Secondary | ICD-10-CM | POA: Diagnosis not present

## 2022-05-20 DIAGNOSIS — D649 Anemia, unspecified: Secondary | ICD-10-CM

## 2022-05-20 DIAGNOSIS — D518 Other vitamin B12 deficiency anemias: Secondary | ICD-10-CM | POA: Diagnosis not present

## 2022-05-20 DIAGNOSIS — Z79899 Other long term (current) drug therapy: Secondary | ICD-10-CM | POA: Diagnosis not present

## 2022-05-20 DIAGNOSIS — D519 Vitamin B12 deficiency anemia, unspecified: Secondary | ICD-10-CM | POA: Insufficient documentation

## 2022-05-20 DIAGNOSIS — D539 Nutritional anemia, unspecified: Secondary | ICD-10-CM | POA: Diagnosis not present

## 2022-05-20 HISTORY — DX: Anemia, unspecified: D64.9

## 2022-05-20 LAB — BASIC METABOLIC PANEL
BUN: 29 — AB (ref 4–21)
CO2: 19 (ref 13–22)
Chloride: 109 — AB (ref 99–108)
Creatinine: 1.4 — AB (ref 0.6–1.3)
Glucose: 150
Potassium: 4.5 mEq/L (ref 3.5–5.1)
Sodium: 139 (ref 137–147)

## 2022-05-20 LAB — TSH: TSH: 3.387 u[IU]/mL (ref 0.350–4.500)

## 2022-05-20 LAB — HEPATIC FUNCTION PANEL
ALT: 23 U/L (ref 10–40)
AST: 26 (ref 14–40)
Alkaline Phosphatase: 49 (ref 25–125)
Bilirubin, Total: 0.6

## 2022-05-20 LAB — COMPREHENSIVE METABOLIC PANEL
Albumin: 4 (ref 3.5–5.0)
Calcium: 8.9 (ref 8.7–10.7)

## 2022-05-20 LAB — CBC
MCV: 93 (ref 80–94)
RBC: 3.27 — AB (ref 3.87–5.11)

## 2022-05-20 LAB — CBC AND DIFFERENTIAL
HCT: 30 — AB (ref 41–53)
Hemoglobin: 9.6 — AB (ref 13.5–17.5)
Neutrophils Absolute: 3.16
Platelets: 136 10*3/uL — AB (ref 150–400)
WBC: 3.9

## 2022-05-20 MED FILL — Nivolumab IV Soln 100 MG/10ML: INTRAVENOUS | Qty: 48 | Status: AC

## 2022-05-20 NOTE — Assessment & Plan Note (Signed)
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  He had a hypermetabolic right submandibular node on PET, but no evidence of distant metastasis.  His original lesion was diagnosed in March 2016.  He underwent excision, but refused aggressive therapy.  He has been receiving palliative nivolumab since November 2016.    He continues to tolerate nivolumab without difficulty.  CT imaging from February 2023 remained without evidence of metastatic disease.  His treatment was on hold from December 2022 to April 2023 due to cardiorespiratory issues.

## 2022-05-20 NOTE — Assessment & Plan Note (Signed)
His hemoglobin initially increased with resuming B12, but now has decreased again.  I recommend checking iron studies today.

## 2022-05-20 NOTE — Assessment & Plan Note (Signed)
He had missed several B12 injections, but these were resumed and he has continued them monthly.  His hemoglobin improved since resuming B12 injections.

## 2022-05-20 NOTE — Progress Notes (Addendum)
[place]  [place],  [place]  [phone]  Addendum: Iron studies were consistent with iron deficiency with a serum iron of 37 mcg/dL, TIBC of 356 mcg/dL, iron saturation 10% and ferritin 46 ng/mL.  I will arrange for him to have IV iron in the form of Feraheme as soon as possible.   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  He had a hypermetabolic right submandibular node on PET, but no evidence of distant metastasis.  His original lesion was diagnosed in March 2016.  He underwent excision, but refused aggressive therapy.  He has been receiving palliative nivolumab since November 2016.    He continues to tolerate nivolumab without difficulty.  CT imaging from February 2023 remained without evidence of metastatic disease.  His treatment was on hold from December 2022 to April 2023 due to cardiorespiratory issues.   B12 deficiency anemia He had missed several B12 injections, but these were resumed and he has continued them monthly.  His hemoglobin improved since resuming B12 injections.  Anemia His hemoglobin initially increased with resuming B12, but now has decreased again.  I recommend checking iron studies today.   The patient understands the plans discussed today and is in agreement with them.  He knows to contact our office if he develops concerns prior to his next appointment.   I provided 20 minutes of face-to-face time during this encounter and > 50% was spent counseling as documented under my assessment and plan.    [name], PA-C  [facility] AT [facility] [address] [place] [address] Dept: [phone] Dept Fax: [phone]   Orders Placed This Encounter  Procedures    CBC and differential    This external order was created through the Results Console.   CBC    This external order was created through the Results Console.   Basic metabolic panel    This external order was created through the Results Console.   Comprehensive metabolic panel    This external order was created through the Results Console.   Hepatic function panel    This external order was created through the Results Console.   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  His original lesion was diagnosed in March 2016.  He underwent excision, but refused aggressive surgery.  The lesion was at least 4 mm thick with a Clark's level 4, and a mitotic index 10/mm.  The margins were positive, but wide reexcision revealed no residual melanoma.  He had recurrence within 2 months and the lesion had been steadily growing.  He initially had refused seeing a medical oncologist, but finally came to [place] for evaluation and treatment in October 2016.  He does have extensive comorbidities including COPD, diabetes, hypertension,  coronary artery disease, history of myocardial infarction, and pacemaker with AICD.  PET revealed hypermetabolism in the right submandibular node, but no evidence of distant metastasis.  He was having significant pain and requiring regular hydrocodone doses.  He also had severe disfigurement and avoided going out in public because of the facial lesion.  He has been receiving palliative nivolumab since November 2016 and has had a good response with a decrease in the right  facial tumor.  He has had associated hypomagnesemia and hypokalemia, so is on oral magnesium and potassium supplements. He has had mild chronic anemia, which has been stable. He was found to have B12 deficiency and continues B12 injections monthly.  CT head/neck in May 2017 revealed some nonspecific skin thickening in the right face in the area of his melanoma in the right submandibular lymph node had decreased in size, now measuring 6 x 12 mm, but still prominent when compared to the left submandibular node.  He had 15 cycles of nivolumab and relocated to continue to get the same treatment.  He had his 30th dose in February 2018 and then returned to the [place] area.     He was admitted to the hospital in early March 2018 for congestive heart failure associated with pleural and pericardial effusions.  He had no leukocytosis, fever or purulent sputum.  The echocardiogram revealed severe diffuse hypokinesis with mild concentric hypertrophy and markedly depressed ejection fraction of 20 to 25%, associated with moderate mitral regurgitation and moderate left atrial dilatation.  He improved with diuresis.  CT chest in March 2018 revealed stable cardiomegaly and stable small pericardial effusion, but no evidence of immune mediated pneumonitis or metastatic disease.  The previously seen bilateral pleural effusions had resolved.  He returned to our care and nivolumab every 2 weeks was resumed in April.  CT chest was repeated in early May after 1 month back on therapy.  This was stable, without evidence of recurrent pleural effusions, immunotherapy toxicities, or evidence of metastatic disease.  He was switched to every 28-day nivolumab in August 2018.  He has had stable disease so has continued on nivolumab.  CT imaging in October 2018 revealed a stable 5 mm level 1B cervical lymph node, as well as a stable 8 mm exophytic lesion of the right face.  CT chest did not reveal any evidence of metastatic disease. Borderline  cardiomegaly and a small amount of pericardial fluid/thickening was seen, but not reaccumulation of the pleural effusions.  CT head did not reveal any evidence of intracranial metastasis.  There were chronic ischemic changes and evidence of old infarcts.   CT neck and chest in February 2019 were stable.  He was admitted to [facility] health in May 2019 twice with severe hypoglycemia.  Repeat imaging in May did not reveal any progressive disease.  His nivolumab was held several times that year unfortunately, his wife passed away in 06/11/18 and she was his primary caregiver.  His daughters have been caring for him since then.  CT neck and chest in November 2019 did not reveal any evidence of recurrent or metastatic disease.  CT imaging in January 2020 did not reveal evidence of recurrence, so he has continued nivolumab every 4 weeks.  He was given mirtazapine for depression and weight loss the dose was increased to 15 mg.  CT scans in June 2020 remained stable with a small pericardial effusion and bilateral renal cysts. CT neck, chest and abdomen in November 2020 did not reveal any evidence  of metastasis. CT head, neck and chest from May and October 2021 were negative for evidence of metastatic disease.  He was then lost to follow-up from December 2021 to April 2022, at which time he resumed nivolumab and B12 every 4 weeks.   CT head, neck and chest in June 2022 did not reveal any evidence of progressive disease.  He was admitted at [facility] in November/December 2022 due to flu and pneumonia.  Nivolumab was placed on hold in December.  CT chest, abdomen and pelvis in February did not reveal any evidence of metastatic disease.  There was new extensive tree in bud nodularity in the right lung with most significant involvement of the right lower lobe. Findings are most consistent with an infectious or inflammatory process in a pattern suggestive of atypical infection including nontuberculous mycobacterium.  He was  seen by Dr. [name], who recommended bronchoscopy, but the patient did not return for follow-up as he did not wish to pursue this procedure.  Nivolumab was resumed in April.   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 ([place])  01/24/2015 Cancer Staging   Staging form: Melanoma of the Skin, AJCC 8th Edition - Clinical stage from 01/24/2015: Stage IIB (cT3b, cN0, cM0) - Signed by [name], MD on 08/19/2021 Histopathologic type: Malignant melanoma, NOS (except juvenile melanoma [id number]) Stage prefix: Initial diagnosis Laterality: Right Lymph-vascular invasion (LVI): LVI not present (absent)/not identified Diagnostic confirmation: Positive histology Specimen type: Excision Staged by: Managing physician Mitotic count: 10 Mitotic unit: mm2 Clark's level: Level IV Tumor-infiltrating lymphocytes: Unknown Neurotropism: Absent Presence of extranodal extension: Absent Breslow depth (mm): 40 Ulceration of the epidermis: Yes Microsatellites: No Primary tumor regression: Unknown Sentinel lymph node biopsy performed: No Matted nodes: No Prognostic indicators: Wide excision negative Stage used in treatment planning: Yes National guidelines used in treatment planning: Yes Type of national guideline used in treatment planning: NCCN   07/18/2020 -  Chemotherapy   Patient is on Treatment Plan : MELANOMA Nivolumab + B12 q28d     07/29/2020 Initial Diagnosis   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 ([place])       INTERVAL HISTORY:  Jashun is here today for repeat clinical assessment prior to nivolumab.  He states he continues to tolerate treatment without significant difficulty.  He does have stable diarrhea 3-4 times a day.  I advised him he could use Imodium for this and gave him a sample to show his daughter.  He does need to contact us if the diarrhea worsens.  He reports stable cough and shortness of breath.  He reports increased lower extremity edema.  He has been  reducing his salt intake, as well as quitting smoking.  He is down to half a pack a day.  He denies fevers or chills. He denies pain. His appetite is good. His weight has increased 1 pounds over last 4 weeks .  He thinks he has a follow-up with his PCP this month.  REVIEW OF SYSTEMS:  Review of Systems  Constitutional:  Negative for appetite change, chills, fatigue, fever and unexpected weight change.  HENT:   Negative for lump/mass, mouth sores and sore throat.   Respiratory:  Negative for cough and shortness of breath.   Cardiovascular:  Negative for chest pain and leg swelling.  Gastrointestinal:  Positive for diarrhea (3-4 stools daily). Negative for abdominal pain, constipation, nausea and vomiting.  Genitourinary:  Negative for difficulty urinating, dysuria, frequency and hematuria.   Musculoskeletal:  Negative  for arthralgias, back pain and myalgias.  Skin:  Negative for itching, rash and wound.  Neurological:  Negative for dizziness, extremity weakness, headaches, light-headedness and numbness.  Hematological:  Negative for adenopathy.  Psychiatric/Behavioral:  Negative for depression and sleep disturbance. The patient is not nervous/anxious.      VITALS:  Blood pressure (!) 118/56, pulse 69, temperature 98.3 F (36.8 C), temperature source Oral, resp. rate 18, height '5\' 7"'$  (1.702 m), weight 172 lb 12.8 oz (78.4 kg), SpO2 96 %.  Wt Readings from Last 3 Encounters:  05/20/22 172 lb 12.8 oz (78.4 kg)  04/28/22 171 lb 12.8 oz (77.9 kg)  04/18/22 168 lb (76.2 kg)    Body mass index is 27.06 kg/m.  Performance status (ECOG): 1 - Symptomatic but completely ambulatory  PHYSICAL EXAM:  Physical Exam Vitals and nursing note reviewed.  Constitutional:      General: He is not in acute distress.    Appearance: Normal appearance. He is normal weight.  HENT:     Head: Normocephalic and atraumatic.     Mouth/Throat:     Mouth: Mucous membranes are moist.     Pharynx: Oropharynx is  clear. No oropharyngeal exudate or posterior oropharyngeal erythema.  Eyes:     General: No scleral icterus.    Extraocular Movements: Extraocular movements intact.     Conjunctiva/sclera: Conjunctivae normal.     Pupils: Pupils are equal, round, and reactive to light.  Cardiovascular:     Rate and Rhythm: Normal rate and regular rhythm.     Heart sounds: Normal heart sounds. No murmur heard.    No friction rub. No gallop.  Pulmonary:     Effort: Pulmonary effort is normal.     Breath sounds: Normal breath sounds. No wheezing, rhonchi or rales.  Abdominal:     General: Bowel sounds are normal. There is no distension.     Palpations: Abdomen is soft. There is no hepatomegaly, splenomegaly or mass.     Tenderness: There is no abdominal tenderness.  Musculoskeletal:        General: Normal range of motion.     Cervical back: Normal range of motion and neck supple. No tenderness.     Right lower leg: Edema (1-2+) present.     Left lower leg: Edema (1-2+) present.  Lymphadenopathy:     Cervical: No cervical adenopathy.     Upper Body:     Right upper body: No supraclavicular or axillary adenopathy.     Left upper body: No supraclavicular or axillary adenopathy.     Lower Body: No right inguinal adenopathy. No left inguinal adenopathy.  Skin:    General: Skin is warm and dry.     Coloration: Skin is not jaundiced.     Findings: No rash.     Comments: Persistent blackhead of right cheek, which is stable  Neurological:     Mental Status: He is alert and oriented to person, place, and time.     Cranial Nerves: No cranial nerve deficit.  Psychiatric:        Mood and Affect: Mood normal.        Behavior: Behavior normal.        Thought Content: Thought content normal.     LABS:      Latest Ref Rng & Units 05/20/2022   12:00 AM 04/16/2022   12:00 AM 03/19/2022   12:00 AM  CBC  WBC  3.9     3.7     3.5  Hemoglobin 13.5 - 17.5 9.6     10.3     9.7      Hematocrit 41 - 53 30      33     31      Platelets 150 - 400 K/uL 136     138     164         This result is from an external source.      Latest Ref Rng & Units 05/20/2022   12:00 AM 04/16/2022   12:00 AM 03/19/2022    4:51 PM  CMP  BUN 4 - '21 29     23       '$ Creatinine 0.6 - 1.3 1.4     1.5       Sodium 137 - 147 139     144       Potassium 3.5 - 5.1 mEq/L 4.5     4.4       Chloride 99 - 108 109     105       CO2 13 - '22 19     28       '$ Calcium 8.7 - 10.7 8.9     8.8     8.8      Alkaline Phos 25 - 125 49     59       AST 14 - 40 26     23       ALT 10 - 40 U/L 23     24          This result is from an external source.     No results found for: "CEA1", "CEA" / No results found for: "CEA1", "CEA" No results found for: "PSA1" No results found for: "JJO841" No results found for: "CAN125"  No results found for: "TOTALPROTELP", "ALBUMINELP", "A1GS", "A2GS", "BETS", "BETA2SER", "GAMS", "MSPIKE", "SPEI" No results found for: "TIBC", "FERRITIN", "IRONPCTSAT" No results found for: "LDH"  STUDIES:  No results found.    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) ([place])     Past Surgical History:  Procedure Laterality Date   APPENDECTOMY     CARDIAC CATHETERIZATION     CORONARY ANGIOPLASTY     PACEMAKER INSERTION     RIGHT/LEFT HEART CATH AND CORONARY ANGIOGRAPHY N/A 10/09/2021   Procedure: RIGHT/LEFT HEART CATH AND CORONARY ANGIOGRAPHY;  Surgeon: Nigel Mormon, MD;  Location: [facility];  Service: Cardiovascular;  Laterality: N/A;    Family History  Problem Relation Age  of Onset   Breast cancer Mother    Diabetes Mellitus II Father    Ovarian cancer Sister    Cancer Brother    Cancer Brother    Cancer Maternal Uncle    Breast cancer Niece     Social History:  reports that he has been smoking cigarettes. He has a 30.00 pack-year smoking history. He has never used smokeless tobacco. He reports that he does not currently use alcohol. He reports that he does not  currently use drugs.The patient is alone today.  Allergies: No Known Allergies  Current Medications: Current Outpatient Medications  Medication Sig Dispense Refill   albuterol (VENTOLIN HFA) 108 (90 Base) MCG/ACT inhaler Inhale into the lungs.     ALBUTEROL SULFATE PO Take by mouth.     amiodarone (PACERONE) 200 MG tablet Take 1 tablet (200 mg total) by mouth daily. 90 tablet 3   apixaban (ELIQUIS) 5 MG TABS tablet Take 1 tablet (5 mg total) by mouth 2 (two) times daily. 180 tablet 3   atorvastatin (LIPITOR) 80 MG tablet Take 1 tablet (80 mg total) by mouth at bedtime. 90 tablet 3   furosemide (LASIX) 20 MG tablet Take 1 tablet (20 mg total) by mouth every other day. 15 tablet 3   magnesium oxide (MAG-OX) 400 (240 Mg) MG tablet Take 1 tablet (400 mg total) by mouth 2 (two) times daily. 60 tablet 5   metFORMIN (GLUCOPHAGE) 1000 MG tablet Take 500 mg by mouth 2 (two) times daily.     metoprolol succinate (TOPROL-XL) 25 MG 24 hr tablet Take 0.5 tablets (12.5 mg total) by mouth daily. 45 tablet 3   ondansetron (ZOFRAN-ODT) 4 MG disintegrating tablet Take 1 tablet by mouth daily as needed.     potassium chloride SA (KLOR-CON M) 20 MEQ tablet Take 1 tablet (20 mEq total) by mouth daily. 90 tablet 3   sacubitril-valsartan (ENTRESTO) 24-26 MG Take 0.5 tablets by mouth 2 (two) times daily. 90 tablet 3   tamsulosin (FLOMAX) 0.4 MG CAPS capsule Take by mouth.     No current facility-administered medications for this visit.

## 2022-05-21 ENCOUNTER — Inpatient Hospital Stay: Payer: Medicare PPO

## 2022-05-21 ENCOUNTER — Encounter: Payer: Self-pay | Admitting: Oncology

## 2022-05-21 ENCOUNTER — Encounter: Payer: Self-pay | Admitting: Hematology and Oncology

## 2022-05-21 ENCOUNTER — Other Ambulatory Visit: Payer: Self-pay | Admitting: Hematology and Oncology

## 2022-05-21 VITALS — BP 122/57 | HR 72 | Temp 98.6°F | Resp 18 | Ht 67.0 in | Wt 171.1 lb

## 2022-05-21 DIAGNOSIS — D519 Vitamin B12 deficiency anemia, unspecified: Secondary | ICD-10-CM | POA: Diagnosis not present

## 2022-05-21 DIAGNOSIS — D509 Iron deficiency anemia, unspecified: Secondary | ICD-10-CM | POA: Insufficient documentation

## 2022-05-21 DIAGNOSIS — Z79899 Other long term (current) drug therapy: Secondary | ICD-10-CM | POA: Diagnosis not present

## 2022-05-21 DIAGNOSIS — C4339 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: Secondary | ICD-10-CM | POA: Diagnosis not present

## 2022-05-21 DIAGNOSIS — Z5112 Encounter for antineoplastic immunotherapy: Secondary | ICD-10-CM | POA: Diagnosis not present

## 2022-05-21 HISTORY — DX: Iron deficiency anemia, unspecified: D50.9

## 2022-05-21 LAB — IRON AND TIBC
Iron: 37 ug/dL — ABNORMAL LOW (ref 45–182)
Saturation Ratios: 10 % — ABNORMAL LOW (ref 17.9–39.5)
TIBC: 356 ug/dL (ref 250–450)
UIBC: 319 ug/dL

## 2022-05-21 LAB — FERRITIN: Ferritin: 46 ng/mL (ref 24–336)

## 2022-05-21 LAB — T4: T4, Total: 9.1 ug/dL (ref 4.5–12.0)

## 2022-05-21 MED ORDER — SODIUM CHLORIDE 0.9% FLUSH
10.0000 mL | INTRAVENOUS | Status: DC | PRN
  Administered 2022-05-21: 10 mL

## 2022-05-21 MED ORDER — SODIUM CHLORIDE 0.9 % IV SOLN: Freq: Once | INTRAVENOUS | Status: AC

## 2022-05-21 MED ORDER — SODIUM CHLORIDE 0.9 % IV SOLN
480.0000 mg | Freq: Once | INTRAVENOUS | Status: AC
  Administered 2022-05-21: 480 mg via INTRAVENOUS
  Filled 2022-05-21: qty 48

## 2022-05-21 MED ORDER — HEPARIN SOD (PORK) LOCK FLUSH 100 UNIT/ML IV SOLN
500.0000 [IU] | Freq: Once | INTRAVENOUS | Status: AC | PRN
  Administered 2022-05-21: 500 [IU]

## 2022-05-21 MED ORDER — CYANOCOBALAMIN 1000 MCG/ML IJ SOLN
1000.0000 ug | Freq: Once | INTRAMUSCULAR | Status: AC
  Administered 2022-05-21: 1000 ug via INTRAMUSCULAR
  Filled 2022-05-21: qty 1

## 2022-05-21 NOTE — Patient Instructions (Signed)
Nivolumab injection What is this medication? NIVOLUMAB (nye VOL ue mab) is a monoclonal antibody. It treats certain types of cancer. Some of the cancers treated are colon cancer, head and neck cancer, Hodgkin lymphoma, lung cancer, and melanoma. This medicine may be used for other purposes; ask your health care provider or pharmacist if you have questions. COMMON BRAND NAME(S): Opdivo What should I tell my care team before I take this medication? They need to know if you have any of these conditions: Autoimmune diseases such as Crohn's disease, ulcerative colitis, or lupus Have had or planning to have an allogeneic stem cell transplant (uses someone else's stem cells) History of chest radiation Organ transplant Nervous system problems such as myasthenia gravis or Guillain-Barre syndrome An unusual or allergic reaction to nivolumab, other medicines, foods, dyes, or preservatives Pregnant or trying to get pregnant Breast-feeding How should I use this medication? This medication is injected into a vein. It is given in a hospital or clinic setting. A special MedGuide will be given to you before each treatment. Be sure to read this information carefully each time. Talk to your care team regarding the use of this medication in children. While it may be prescribed for children as young as 12 years for selected conditions, precautions do apply. Overdosage: If you think you have taken too much of this medicine contact a poison control center or emergency room at once. NOTE: This medicine is only for you. Do not share this medicine with others. What if I miss a dose? Keep appointments for follow-up doses. It is important not to miss your dose. Call your care team if you are unable to keep an appointment. What may interact with this medication? Interactions have not been studied. This list may not describe all possible interactions. Give your health care provider a list of all the medicines, herbs,  non-prescription drugs, or dietary supplements you use. Also tell them if you smoke, drink alcohol, or use illegal drugs. Some items may interact with your medicine. What should I watch for while using this medication? Your condition will be monitored carefully while you are receiving this medication. You may need blood work done while you are taking this medication. Do not become pregnant while taking this medication or for 5 months after stopping it. Women should inform their care team if they wish to become pregnant or think they might be pregnant. There is a potential for serious harm to an unborn child. Talk to your care team for more information. Do not breast-feed an infant while taking this medication or for 5 months after stopping it. What side effects may I notice from receiving this medication? Side effects that you should report to your care team as soon as possible: Allergic reactions--skin rash, itching, hives, swelling of the face, lips, tongue, or throat Bloody or black, tar-like stools Change in vision Chest pain Diarrhea Dry cough, shortness of breath or trouble breathing Eye pain Fast or irregular heartbeat Fever, chills High blood sugar (hyperglycemia)--increased thirst or amount of urine, unusual weakness or fatigue, blurry vision High thyroid levels (hyperthyroidism)--fast or irregular heartbeat, weight loss, excessive sweating or sensitivity to heat, tremors or shaking, anxiety, nervousness, irregular menstrual cycle or spotting Kidney injury--decrease in the amount of urine, swelling of the ankles, hands, or feet Liver injury--right upper belly pain, loss of appetite, nausea, light-colored stool, dark yellow or brown urine, yellowing skin or eyes, unusual weakness or fatigue Low red blood cell count--unusual weakness or fatigue, dizziness, headache, trouble breathing   Low thyroid levels (hypothyroidism)--unusual weakness or fatigue, increased sensitivity to cold,  constipation, hair loss, dry skin, weight gain, feelings of depression Mood and behavior changes-confusion, change in sex drive or performance, irritability Muscle pain or cramps Pain, tingling, or numbness in the hands or feet, muscle weakness, trouble walking, loss of balance or coordination Red or dark brown urine Redness, blistering, peeling, or loosening of the skin, including inside the mouth Stomach pain Unusual bruising or bleeding Side effects that usually do not require medical attention (report to your care team if they continue or are bothersome): Bone pain Constipation Loss of appetite Nausea Tiredness Vomiting This list may not describe all possible side effects. Call your doctor for medical advice about side effects. You may report side effects to FDA at 1-800-FDA-1088. Where should I keep my medication? This medication is given in a hospital or clinic and will not be stored at home. NOTE: This sheet is a summary. It may not cover all possible information. If you have questions about this medicine, talk to your doctor, pharmacist, or health care provider.  2023 Elsevier/Gold Standard (2021-09-27 00:00:00)  

## 2022-05-22 ENCOUNTER — Telehealth: Payer: Self-pay | Admitting: Oncology

## 2022-05-22 ENCOUNTER — Other Ambulatory Visit: Payer: Medicare PPO

## 2022-05-22 DIAGNOSIS — Z515 Encounter for palliative care: Secondary | ICD-10-CM

## 2022-05-22 NOTE — Progress Notes (Signed)
COMMUNITY PALLIATIVE CARE SW NOTE  PATIENT NAME: Kevin [name] DOB: 1941/10/14 MRN: [id number]  PRIMARY CARE PROVIDER: [name], PA-C  RESPONSIBLE PARTY:  Acct ID - Guarantor Home Phone Work Phone Relationship Acct Type  [id number] Kevin Phenes262 [phone]  Self P/F     [address], [place], [place] [address]     PLAN OF CARE and INTERVENTIONS:                 GOALS OF CARE/ ADVANCE CARE PLANNING: Goals include to maximize quality of life and symptom management. Our advance care planning conversation included a discussion about:    The value and importance of advance care planning  Review and updating or creation of an advance directive document.  Code Status: Patient is a FULL CODE.   2.      Palliative care encounter: Palliative medicine team will continue to support patient, patient's family, and medical team. Visit consisted of counseling and education dealing with the complex and emotionally intense issues of symptom management and palliative care in the setting of serious and potentially life-threatening illness.  SW completed in home visit, met with patient.  Functional changes/updates: Patient has not any functional changes or decline since previous PC visit. Patient received sitting in living room. Patient share that he hasn't felt too good this week due to receiving chemo, as he usually feels bad a day or 2 after chemo.  Malignant melanoma: patient is receiving chemo once a month.  Falls: none reported.  ADLS: patient is independent with ADL's. does not use any type of AD. Has elevated toilet seat.  Pain: none reported  Hypertension: swelling in bilateral lower feet. Patient encouraged to elevate feet. Patient has compression stockings but does not use them. Patient is on Lasix 56m QOD. Patients checks BP and weighs himself daily. BP readings recently 122/57. Wt 167lbs. Vitals go directly to doctors office for monitoring.  Breathing: Started O2 at HS only,  2.5LPM this week. Patient continues to smoke a pack and a half of cigarettes in a 2 day time span. Patient share he is trying to slow down on smoking and will be purchasing cessation patches.using breathing treatment Q HS.  Home & Environment assessment: No concerns. Home is one level. Patient feels safe in his home environment. Patient is able to maneuver around is home without issue.   Protein calorie malnutrition/weight loss/Appetite: Patient's appetite is fair. Reports no weight loss concerns.   Psychosocial assessment: completed.   In-Home support: patients daughter Kevin Chenlives with him and manages pill box. However, patient does not require in home assistance.   Transportation: no needs. Daughters provide transportation.  Food: No needs. Patients daughters provide meals for patient. Patient does not cook.  MOW's discussed, patient not interested at this time.  Long term goal: patient desires to remain in his home.  Ongoing support/resources will continued to be offered if needed.   SW discussed goals, reviewed care plan, provided emotional support, used active and reflective listening in the form of reciprocity emotional response. Patient wishes to stay in the home for as long as possible. Questions and concerns were addressed. The patient/family was encouraged to call with any additional questions and/or concerns. PC Provided general support and encouragement, no other unmet needs identified. Will continue to follow.  3.         PATIENT/CAREGIVER EDUCATION/ COPING:   Appearance: well groomed, appropriate given situation  Mental Status: alert and oriented  Eye Contact: good Thought Process: rational  Thought Content: rational  Speech: Normal rate, low volume, tone  Mood: Normal and calm Affect: Congruent to endorsed mood, full ranging Insight: good Judgement: good Interaction Style: Cooperative  Patient A&O and able to make needs known. Patient actively engaged in conversation.  Patient denied feelings of anxiety or depression. PHQ-9 is 0. However, does state that he gets sad every now and then when he thinks about his deceased wife  (spouse has been deceased for 3 years). Patient is not interested in grief counseling. Patient enjoys sitting on his porch and taking walks.  Family is supportive and provides physical and emotional support to patient.   4.         PERSONAL EMERGENCY PLAN:  Patient will call 9-1-1 for emergencies .   5.         COMMUNITY RESOURCES COORDINATION/ HEALTH CARE NAVIGATION:  Patient and daughter, Kevin [name], manages his care.  6.         FINANCIAL CONCERNS/NEEDS: None.   Primary Health Insurance: [facility] Medicare Secondary Health Insurance: None  Prescription Coverage: Yes, no history of difficulty obtaining or affording prescriptions reported     SOCIAL HX:  Social History   Tobacco Use   Smoking status: Some Days    Packs/day: 0.50    Years: 60.00    Total pack years: 30.00    Types: Cigarettes   Smokeless tobacco: Never  Substance Use Topics   Alcohol use: Not Currently    CODE STATUS: full code ADVANCED DIRECTIVES: N MOST FORM COMPLETE:  N HOSPICE EDUCATION PROVIDED: N  PPS: Patient is INDP with all ADL's at this time. Patient is A&O  with good insight and judgement.   Time spent: 45 min      [place], [place]

## 2022-05-22 NOTE — Telephone Encounter (Signed)
Contacted pt's daughter to schedule IV Iron infusions. Unable to reach via phone, voicemail was left.

## 2022-05-26 ENCOUNTER — Telehealth: Payer: Self-pay | Admitting: Oncology

## 2022-05-26 NOTE — Telephone Encounter (Signed)
Contacted pt to schedule an appt. Unable to reach via phone, voicemail was left.    Entered by Rosanne Sack A on 05/21/2022 at  3:57 PM Priority: Routine <No visit type provided>  Department: [facility]  Provider:   Scheduling Notes:  Please schedule Feraheme weekly x 2. His daughter can bring him Demetrius Charity or Friday next week, then Tue or Wed the following week. Please call her with appts. Thanks!

## 2022-05-27 ENCOUNTER — Other Ambulatory Visit: Payer: Self-pay | Admitting: Pharmacist

## 2022-05-29 MED FILL — Ferumoxytol Inj 510 MG/17ML (30 MG/ML) (Elemental Fe): INTRAVENOUS | Qty: 17 | Status: AC

## 2022-05-30 ENCOUNTER — Inpatient Hospital Stay: Payer: Medicare PPO

## 2022-05-30 VITALS — BP 123/68 | HR 89 | Temp 98.2°F | Resp 18 | Wt 172.0 lb

## 2022-05-30 DIAGNOSIS — D509 Iron deficiency anemia, unspecified: Secondary | ICD-10-CM

## 2022-05-30 DIAGNOSIS — Z5112 Encounter for antineoplastic immunotherapy: Secondary | ICD-10-CM | POA: Diagnosis not present

## 2022-05-30 DIAGNOSIS — D519 Vitamin B12 deficiency anemia, unspecified: Secondary | ICD-10-CM | POA: Diagnosis not present

## 2022-05-30 DIAGNOSIS — C4339 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: Secondary | ICD-10-CM | POA: Diagnosis not present

## 2022-05-30 DIAGNOSIS — Z79899 Other long term (current) drug therapy: Secondary | ICD-10-CM | POA: Diagnosis not present

## 2022-05-30 MED ORDER — SODIUM CHLORIDE 0.9 % IV SOLN: Freq: Once | INTRAVENOUS | Status: AC

## 2022-05-30 MED ORDER — SODIUM CHLORIDE 0.9 % IV SOLN
510.0000 mg | Freq: Once | INTRAVENOUS | Status: AC
  Administered 2022-05-30: 510 mg via INTRAVENOUS
  Filled 2022-05-30: qty 510

## 2022-05-30 NOTE — Patient Instructions (Signed)

## 2022-06-02 ENCOUNTER — Other Ambulatory Visit: Payer: Self-pay | Admitting: Hematology and Oncology

## 2022-06-02 ENCOUNTER — Other Ambulatory Visit: Payer: Self-pay

## 2022-06-02 DIAGNOSIS — R808 Other proteinuria: Secondary | ICD-10-CM

## 2022-06-04 ENCOUNTER — Inpatient Hospital Stay: Payer: Medicare PPO

## 2022-06-04 VITALS — BP 117/68 | HR 77 | Temp 97.8°F | Resp 20 | Ht 67.0 in | Wt 173.0 lb

## 2022-06-04 DIAGNOSIS — Z5112 Encounter for antineoplastic immunotherapy: Secondary | ICD-10-CM | POA: Diagnosis not present

## 2022-06-04 DIAGNOSIS — Z79899 Other long term (current) drug therapy: Secondary | ICD-10-CM | POA: Diagnosis not present

## 2022-06-04 DIAGNOSIS — C4339 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: Secondary | ICD-10-CM | POA: Diagnosis not present

## 2022-06-04 DIAGNOSIS — D509 Iron deficiency anemia, unspecified: Secondary | ICD-10-CM

## 2022-06-04 DIAGNOSIS — D519 Vitamin B12 deficiency anemia, unspecified: Secondary | ICD-10-CM | POA: Diagnosis not present

## 2022-06-04 MED ORDER — SODIUM CHLORIDE 0.9 % IV SOLN: Freq: Once | INTRAVENOUS | Status: AC

## 2022-06-04 MED ORDER — SODIUM CHLORIDE 0.9 % IV SOLN
510.0000 mg | Freq: Once | INTRAVENOUS | Status: AC
  Administered 2022-06-04: 510 mg via INTRAVENOUS
  Filled 2022-06-04: qty 510

## 2022-06-04 NOTE — Patient Instructions (Signed)

## 2022-06-05 ENCOUNTER — Other Ambulatory Visit: Payer: Self-pay | Admitting: Oncology

## 2022-06-05 ENCOUNTER — Telehealth: Payer: Self-pay | Admitting: Oncology

## 2022-06-05 DIAGNOSIS — C4339 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: Secondary | ICD-10-CM

## 2022-06-05 NOTE — Telephone Encounter (Signed)
Per 06/05/22 Spoke with patients daughter(Shelley)and scheduled ct scans on 06/12/22 arrive at 1230pm.Appt at 1pm

## 2022-06-06 ENCOUNTER — Telehealth: Payer: Self-pay | Admitting: Cardiology

## 2022-06-06 DIAGNOSIS — I5022 Chronic systolic (congestive) heart failure: Secondary | ICD-10-CM

## 2022-06-06 MED ORDER — FUROSEMIDE 40 MG PO TABS: 40.0000 mg | ORAL_TABLET | ORAL | 1 refills | Status: DC

## 2022-06-06 MED ORDER — POTASSIUM CHLORIDE CRYS ER 20 MEQ PO TBCR: 20.0000 meq | EXTENDED_RELEASE_TABLET | Freq: Every day | ORAL | 3 refills | Status: DC

## 2022-06-06 NOTE — Telephone Encounter (Signed)
Patient enrolled in RPM and called about leg edema.  I will increase the dose of furosemide to 40 mg daily, will obtain BMP.  I will schedule him to be seen in our office over the next 1 to 2 weeks unless he is having worsening dyspnea or he is not improving he needs to call us to be seen on an emergent basis in our office.    ICD-10-CM   1. Chronic systolic heart failure (HCC)  I50.22 furosemide (LASIX) 40 MG tablet    Brain natriuretic peptide     Meds ordered this encounter  Medications   furosemide (LASIX) 40 MG tablet    Sig: Take 1 tablet (40 mg total) by mouth every other day.    Dispense:  90 tablet    Refill:  1   potassium chloride SA (KLOR-CON M) 20 MEQ tablet    Sig: Take 1 tablet (20 mEq total) by mouth daily.    Dispense:  90 tablet    Refill:  3    Orders Placed This Encounter  Procedures   Brain natriuretic peptide

## 2022-06-12 ENCOUNTER — Other Ambulatory Visit: Payer: Self-pay | Admitting: Cardiology

## 2022-06-12 ENCOUNTER — Other Ambulatory Visit: Payer: Self-pay

## 2022-06-12 DIAGNOSIS — J9811 Atelectasis: Secondary | ICD-10-CM | POA: Diagnosis not present

## 2022-06-12 DIAGNOSIS — I3139 Other pericardial effusion (noninflammatory): Secondary | ICD-10-CM | POA: Diagnosis not present

## 2022-06-12 DIAGNOSIS — I517 Cardiomegaly: Secondary | ICD-10-CM | POA: Diagnosis not present

## 2022-06-12 DIAGNOSIS — J90 Pleural effusion, not elsewhere classified: Secondary | ICD-10-CM | POA: Diagnosis not present

## 2022-06-12 DIAGNOSIS — C4339 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: Secondary | ICD-10-CM | POA: Diagnosis not present

## 2022-06-12 DIAGNOSIS — I2510 Atherosclerotic heart disease of native coronary artery without angina pectoris: Secondary | ICD-10-CM | POA: Diagnosis not present

## 2022-06-12 DIAGNOSIS — J432 Centrilobular emphysema: Secondary | ICD-10-CM | POA: Diagnosis not present

## 2022-06-12 DIAGNOSIS — I5022 Chronic systolic (congestive) heart failure: Secondary | ICD-10-CM

## 2022-06-12 DIAGNOSIS — K8020 Calculus of gallbladder without cholecystitis without obstruction: Secondary | ICD-10-CM | POA: Diagnosis not present

## 2022-06-12 DIAGNOSIS — I700 Atherosclerosis of aorta: Secondary | ICD-10-CM | POA: Diagnosis not present

## 2022-06-12 DIAGNOSIS — R188 Other ascites: Secondary | ICD-10-CM | POA: Diagnosis not present

## 2022-06-12 DIAGNOSIS — E8770 Fluid overload, unspecified: Secondary | ICD-10-CM | POA: Diagnosis not present

## 2022-06-12 MED ORDER — METOPROLOL SUCCINATE ER 25 MG PO TB24: 12.5000 mg | ORAL_TABLET | Freq: Every day | ORAL | 3 refills | Status: DC

## 2022-06-12 NOTE — Telephone Encounter (Signed)
Patient would like to have metoprolol filled at [facility] mail order instead of [facility]

## 2022-06-13 ENCOUNTER — Encounter: Payer: Self-pay | Admitting: Oncology

## 2022-06-13 LAB — BRAIN NATRIURETIC PEPTIDE: BNP: 983 pg/mL — ABNORMAL HIGH (ref 0.0–100.0)

## 2022-06-15 NOTE — Progress Notes (Signed)
[facility]  [address] 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  He had a hypermetabolic right submandibular node on PET, but no evidence of distant metastasis.  His original lesion was diagnosed in March 2016.  He underwent excision, but refused aggressive therapy.  He has been receiving palliative nivolumab since November 2016, with several interruptions. His treatment was on hold from December 2022 to April 2023 due to cardiorespiratory issues. He continues to tolerate nivolumab without difficulty.  CT imaging from August 2023 remained without evidence of metastatic disease.  However, it did show increasing bilateral pleural effusions and stable pericardial effusion.   B12 deficiency anemia He had missed several B12 injections, but these were resumed and he has continued them monthly.  His hemoglobin improved since resuming B12 injections.   Anemia His hemoglobin initially increased with resuming B12, but now has decreased again.  Iron studies from last month revealed iron deficiency so he received IV iron..   Increasing bilateral pleural effusions He also has a stable pericardial effusion. This appears to be fluid overload and he is on Lasix 40 mg daily. I will get copies of his labs to his cardiologist to see if they wish to change anything. His daughter will give him an extra 20 mg twice weekly. It is possible that this could be related to his treatment but he has known CHF and this has fluctuated over the years.  He is not particularly symptomatic at this time.   I am concerned about his fluid overload, yet his creatinine is up to 1.6 with a BUN of 30, so we are navigating a fine line.  I explained this to the daughter. I think  some of his cough may be caused by the fluid but his BNP is not bad. I recommended he continue the Lasix 40 mg daily as instructed by the cardiologist, and his daughter will give him an extra 20 mg twice/week. We will proceed with his immunotherapy and see him back in 4 weeks with CBC and CMP. I reviewed the scan results and all of the information to the patient and his daughter, and answered their questions. They understand and agree with this plan of care.   I provided 20 minutes of face-to-face time during this encounter and > 50% was spent counseling as documented under my assessment and plan.    [name], MD  [facility] [address] [place] [address] Dept: [phone] Dept Fax: [phone]   Orders Placed This Encounter  Procedures  . Basic metabolic panel    This external order was created through the Results Console.  . Comprehensive metabolic panel    This external order was created through the Results Console.  . Hepatic function panel    This external order was created through the Results Console.  . Magnesium    This order was created through External Result Entry  . CBC and differential    This external order was created through the Results Console.  . CBC    This external order was created through the Results Console.      CHIEF COMPLAINT:  CC: Recurrent malignant melanoma of the right face  Current Treatment: Maintenance  nivolumab  HISTORY OF PRESENT ILLNESS:  Kevin [name] is an 81 year old with 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  His original lesion was diagnosed in March 2016.  He underwent excision, but refused aggressive surgery.  The lesion was at least 4 mm thick with a Clark's level 4, and a mitotic index 10/mm.  The margins were positive, but wide reexcision revealed no residual melanoma.  He had recurrence within 2 months and the lesion had been steadily  growing.  He initially had refused seeing a medical oncologist, but finally came to [place] for evaluation and treatment in October 2016.  He does have extensive comorbidities including COPD, diabetes, hypertension, coronary artery disease, history of myocardial infarction, and pacemaker with AICD.  PET revealed hypermetabolism in the right submandibular node, but no evidence of distant metastasis.  He was having significant pain and requiring regular hydrocodone doses.  He also had severe disfigurement and avoided going out in public because of the facial lesion.  He has been receiving palliative nivolumab since November 2016 and has had a good response with a decrease in the right facial tumor.  He has had associated hypomagnesemia and hypokalemia, so is on oral magnesium and potassium supplements. He has had mild chronic anemia, which has been stable. He was found to have B12 deficiency and continues B12 injections monthly.  CT head/neck in May 2017 revealed some nonspecific skin thickening in the right face in the area of his melanoma in the right submandibular lymph node had decreased in size, now measuring 6 x 12 mm, but still prominent when compared to the left submandibular node.  He had 15 cycles of nivolumab and relocated to continue to get the same treatment.  He had his 30th dose in February 2018 and then returned to the [place] area.     He was admitted to the hospital in early March 2018 for congestive heart failure associated with pleural and pericardial effusions.  He had no leukocytosis, fever or purulent sputum.  The echocardiogram revealed severe diffuse hypokinesis with mild concentric hypertrophy and markedly depressed ejection fraction of 20 to 25%, associated with moderate mitral regurgitation and moderate left atrial dilatation.  He improved with diuresis.  CT chest in March 2018 revealed stable cardiomegaly and stable small pericardial effusion, but no evidence of immune mediated pneumonitis  or metastatic disease.  The previously seen bilateral pleural effusions had resolved.  He returned to our care and nivolumab every 2 weeks was resumed in April.  CT chest was repeated in early May after 1 month back on therapy.  This was stable, without evidence of recurrent pleural effusions, immunotherapy toxicities, or evidence of metastatic disease.  He was switched to every 28-day nivolumab in August 2018.  He has had stable disease so has continued on nivolumab.  CT imaging in October 2018 revealed a stable 5 mm level 1B cervical lymph node, as well as a stable 8 mm exophytic lesion of the right face.  CT chest did not reveal any evidence of metastatic disease. Borderline cardiomegaly and a small amount of pericardial fluid/thickening was seen, but not reaccumulation of the pleural effusions.  CT head did not reveal any evidence of intracranial metastasis.  There were chronic ischemic changes and evidence of old infarcts.   Unfortunately, his wife passed away in 06/05/18 and she was his primary caregiver His daughters have been caring for him since then.  CT scans have remained stable, so he has continued nivolumab every 4 weeks.  He  was given mirtazapine for depression and weight loss the dose was increased to 15 mg.  He was then lost to follow-up from December 2021 to April 2022, at which time he resumed nivolumab and B12 every 4 weeks.   CT head, neck and chest in June 2022 did not reveal any evidence of progressive disease.  He was admitted at [facility] in November/December 2022 due to flu and pneumonia.  Nivolumab was placed on hold in December.  CT chest, abdomen and pelvis in February did not reveal any evidence of metastatic disease.  There was new extensive tree in bud nodularity in the right lung with most significant involvement of the right lower lobe. Findings are most consistent with an infectious or inflammatory process in a pattern suggestive of atypical infection including nontuberculous  mycobacterium.  He was seen by Dr. [name], who recommended bronchoscopy, but the patient did not return for follow-up as he did not wish to pursue this procedure.  Nivolumab was resumed in April.   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 ([place])  01/24/2015 Cancer Staging   Staging form: Melanoma of the Skin, AJCC 8th Edition - Clinical stage from 01/24/2015: Stage IIB (cT3b, cN0, cM0) - Signed by [name], MD on 08/19/2021 Histopathologic type: Malignant melanoma, NOS (except juvenile melanoma [id number]) Stage prefix: Initial diagnosis Laterality: Right Lymph-vascular invasion (LVI): LVI not present (absent)/not identified Diagnostic confirmation: Positive histology Specimen type: Excision Staged by: Managing physician Mitotic count: 10 Mitotic unit: mm2 Clark's level: Level IV Tumor-infiltrating lymphocytes: Unknown Neurotropism: Absent Presence of extranodal extension: Absent Breslow depth (mm): 40 Ulceration of the epidermis: Yes Microsatellites: No Primary tumor regression: Unknown Sentinel lymph node biopsy performed: No Matted nodes: No Prognostic indicators: Wide excision negative Stage used in treatment planning: Yes National guidelines used in treatment planning: Yes Type of national guideline used in treatment planning: NCCN   07/18/2020 -  Chemotherapy   Patient is on Treatment Plan : MELANOMA Nivolumab + B12 q28d     07/29/2020 Initial Diagnosis   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 ([place])       INTERVAL HISTORY:  Dell is here today for repeat clinical assessment prior to nivolumab.  He states he continues to tolerate treatment without significant difficulty.  He does have stable diarrhea 3-4 times a day.  I advised him he could use Imodium for this and explained this to his daughter.  She is with him today. He does need to contact us if the diarrhea worsens.  He reports cough at night, and shortness of breath.  He reports increased  lower extremity edema.  He has been reducing his salt intake, as well as quitting smoking.His creatinine is up from 1.4 to 1.6 with a BUN of 30. His hemoglobin is up from 9.8 to 10.3 but his platelets are down from 136,000 to 104,000.  His CT scan does not show metastatic disease but he does have increased bilateral pleural effusions, right greater than left, with bibasilar atelectasis. He has cardiomegaly and small pericardial effusion. He has a small volume of ascites and cholelithiasis.  He denies fevers or chills. He denies pain. His appetite is good. His weight has increased 6 pounds over last 4 weeks .  He thinks he has a follow-up with his PCP this month, and cardiologist in September.  REVIEW OF SYSTEMS:  Review of Systems  Constitutional:  Negative for appetite change, chills, fatigue, fever and unexpected weight change.  HENT:   Negative  for lump/mass, mouth sores and sore throat.   Respiratory:  Negative for cough and shortness of breath.   Cardiovascular:  Positive for leg swelling. Negative for chest pain.  Gastrointestinal:  Negative for abdominal pain, constipation, diarrhea (3-4 stools daily), nausea and vomiting.  Genitourinary:  Negative for difficulty urinating, dysuria, frequency and hematuria.   Musculoskeletal:  Negative for arthralgias, back pain and myalgias.  Skin:  Negative for itching, rash and wound.  Neurological:  Negative for dizziness, extremity weakness, headaches, light-headedness and numbness.  Hematological:  Negative for adenopathy.  Psychiatric/Behavioral:  Negative for depression and sleep disturbance. The patient is not nervous/anxious.      VITALS:  Blood pressure (!) 114/54, pulse 71, temperature 98.2 F (36.8 C), temperature source Oral, resp. rate 20, height '5\' 7"'$  (1.702 m), weight 179 lb 11.2 oz (81.5 kg), SpO2 98 %.  Wt Readings from Last 3 Encounters:  06/18/22 177 lb 7 oz (80.5 kg)  06/16/22 179 lb 11.2 oz (81.5 kg)  06/04/22 173 lb (78.5 kg)     Body mass index is 28.14 kg/m.  Performance status (ECOG): 1 - Symptomatic but completely ambulatory  PHYSICAL EXAM:  Physical Exam Vitals and nursing note reviewed.  Constitutional:      General: He is not in acute distress.    Appearance: Normal appearance. He is normal weight.  HENT:     Head: Normocephalic and atraumatic.     Mouth/Throat:     Mouth: Mucous membranes are moist.     Pharynx: Oropharynx is clear. No oropharyngeal exudate or posterior oropharyngeal erythema.  Eyes:     General: No scleral icterus.    Extraocular Movements: Extraocular movements intact.     Conjunctiva/sclera: Conjunctivae normal.     Pupils: Pupils are equal, round, and reactive to light.  Cardiovascular:     Rate and Rhythm: Normal rate and regular rhythm.     Heart sounds: Normal heart sounds. No murmur heard.    No friction rub. No gallop.  Pulmonary:     Effort: Pulmonary effort is normal.     Breath sounds: Normal breath sounds. No wheezing, rhonchi or rales.  Abdominal:     General: Bowel sounds are normal. There is no distension.     Palpations: Abdomen is soft. There is no hepatomegaly, splenomegaly or mass.     Tenderness: There is no abdominal tenderness.  Musculoskeletal:        General: Normal range of motion.     Cervical back: Normal range of motion and neck supple. No tenderness.     Right lower leg: Edema (1-2+) present.     Left lower leg: Edema (1-2+) present.  Lymphadenopathy:     Cervical: No cervical adenopathy.     Upper Body:     Right upper body: No supraclavicular or axillary adenopathy.     Left upper body: No supraclavicular or axillary adenopathy.     Lower Body: No right inguinal adenopathy. No left inguinal adenopathy.  Skin:    General: Skin is warm and dry.     Coloration: Skin is not jaundiced.     Findings: No rash.     Comments: Persistent blackhead of right cheek, which is stable  Neurological:     Mental Status: He is alert and oriented to  person, place, and time.     Cranial Nerves: No cranial nerve deficit.  Psychiatric:        Mood and Affect: Mood normal.  Behavior: Behavior normal.        Thought Content: Thought content normal.    LABS:      Latest Ref Rng & Units 06/16/2022   12:00 AM 05/20/2022   12:00 AM 04/16/2022   12:00 AM  CBC  WBC  4.9     3.9     3.7      Hemoglobin 13.5 - 17.5 10.2     9.6     10.3      Hematocrit 41 - 53 31     30     33      Platelets 150 - 400 K/uL 104     136     138         This result is from an external source.      Latest Ref Rng & Units 06/16/2022   12:00 AM 05/20/2022   12:00 AM 04/16/2022   12:00 AM  CMP  BUN 4 - '21 30     29     23      '$ Creatinine 0.6 - 1.3 1.6     1.4     1.5      Sodium 137 - 147 142     139     144      Potassium 3.5 - 5.1 mEq/L 4.0     4.5     4.4      Chloride 99 - 108 108     109     105      CO2 13 - '22 28     19     28      '$ Calcium 8.7 - 10.7 8.7     8.9     8.8      Alkaline Phos 25 - 125 82     49     59      AST 14 - 40 '25     26     23      '$ ALT 10 - 40 U/L 35     23     24         This result is from an external source.     No results found for: "CEA1", "CEA" / No results found for: "CEA1", "CEA" No results found for: "PSA1" No results found for: "DXI338" No results found for: "CAN125"  No results found for: "TOTALPROTELP", "ALBUMINELP", "A1GS", "A2GS", "BETS", "BETA2SER", "GAMS", "MSPIKE", "SPEI" Lab Results  Component Value Date   TIBC 356 05/21/2022   FERRITIN 46 05/21/2022   IRONPCTSAT 10 (L) 05/21/2022   No results found for: "LDH"  STUDIES:  No results found.    HISTORY:   Past Medical History:  Diagnosis Date  . Anemia 05/20/2022  . Cancer ([place])   . CHF (congestive heart failure) ([place])   . COPD (chronic obstructive pulmonary disease) ([place])   . Diabetes mellitus without complication ([place])   . History of myocardial infarction   . Hyperlipidemia   . Hypertension   . Iron deficiency anemia 05/21/2022  .  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 APPENDECTOMY    . CARDIAC CATHETERIZATION    . CORONARY ANGIOPLASTY    . PACEMAKER INSERTION    . RIGHT/LEFT HEART CATH AND CORONARY ANGIOGRAPHY  N/A 10/09/2021   Procedure: RIGHT/LEFT HEART CATH AND CORONARY ANGIOGRAPHY;  Surgeon: Nigel Mormon, MD;  Location: [facility];  Service: Cardiovascular;  Laterality: N/A;    Family History  Problem Relation Age of Onset  . Breast cancer Mother   . Diabetes Mellitus II Father   . Ovarian cancer Sister   . Cancer Brother   . Cancer Brother   . Cancer Maternal Uncle   . Breast cancer Niece     Social History:  reports that he has been smoking cigarettes. He has a 30.00 pack-year smoking history. He has never used smokeless tobacco. He reports that he does not currently use alcohol. He reports that he does not currently use drugs.The patient is alone today.  Allergies: No Known Allergies  Current Medications: Current Outpatient Medications  Medication Sig Dispense Refill  . albuterol (VENTOLIN HFA) 108 (90 Base) MCG/ACT inhaler Inhale into the lungs.    . ALBUTEROL SULFATE PO Take by mouth.    Marland Kitchen amiodarone (PACERONE) 200 MG tablet Take 1 tablet (200 mg total) by mouth daily. 90 tablet 3  . apixaban (ELIQUIS) 5 MG TABS tablet Take 1 tablet (5 mg total) by mouth 2 (two) times daily. 180 tablet 3  . atorvastatin (LIPITOR) 80 MG tablet Take 1 tablet (80 mg total) by mouth at bedtime. 90 tablet 3  . Fe Fum-FA-B Cmp-C-Zn-Mg-Mn-Cu (HEMOCYTE-PLUS) 106-1 MG TABS Take 1 tablet by mouth daily. 30 tablet 5  . furosemide (LASIX) 40 MG tablet Take 1 tablet (40 mg total) by mouth every other day. 90 tablet 1  . magnesium oxide (MAG-OX) 400 (240 Mg) MG tablet Take 1 tablet (400 mg total) by mouth 2 (two) times daily.  60 tablet 5  . metFORMIN (GLUCOPHAGE) 1000 MG tablet Take 500 mg by mouth 2 (two) times daily.    . metoprolol succinate (TOPROL-XL) 25 MG 24 hr tablet Take 0.5 tablets (12.5 mg total) by mouth daily. 45 tablet 3  . ondansetron (ZOFRAN-ODT) 4 MG disintegrating tablet Take 1 tablet by mouth daily as needed.    . potassium chloride SA (KLOR-CON M) 20 MEQ tablet Take 1 tablet (20 mEq total) by mouth daily. 90 tablet 3  . sacubitril-valsartan (ENTRESTO) 24-26 MG Take 0.5 tablets by mouth 2 (two) times daily. 90 tablet 3  . tamsulosin (FLOMAX) 0.4 MG CAPS capsule Take by mouth. (Patient not taking: Reported on 06/16/2022)     No current facility-administered medications for this visit.

## 2022-06-16 ENCOUNTER — Other Ambulatory Visit: Payer: Self-pay

## 2022-06-16 ENCOUNTER — Other Ambulatory Visit: Payer: Self-pay | Admitting: Oncology

## 2022-06-16 ENCOUNTER — Inpatient Hospital Stay: Payer: Medicare PPO | Attending: Oncology | Admitting: Oncology

## 2022-06-16 ENCOUNTER — Encounter: Payer: Self-pay | Admitting: Oncology

## 2022-06-16 ENCOUNTER — Inpatient Hospital Stay: Payer: Medicare PPO

## 2022-06-16 VITALS — BP 114/54 | HR 71 | Temp 98.2°F | Resp 20 | Ht 67.0 in | Wt 179.7 lb

## 2022-06-16 DIAGNOSIS — D519 Vitamin B12 deficiency anemia, unspecified: Secondary | ICD-10-CM | POA: Insufficient documentation

## 2022-06-16 DIAGNOSIS — D539 Nutritional anemia, unspecified: Secondary | ICD-10-CM | POA: Diagnosis not present

## 2022-06-16 DIAGNOSIS — D649 Anemia, unspecified: Secondary | ICD-10-CM | POA: Diagnosis not present

## 2022-06-16 DIAGNOSIS — D518 Other vitamin B12 deficiency anemias: Secondary | ICD-10-CM

## 2022-06-16 DIAGNOSIS — I5021 Acute systolic (congestive) heart failure: Secondary | ICD-10-CM | POA: Diagnosis not present

## 2022-06-16 DIAGNOSIS — C4339 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: Secondary | ICD-10-CM | POA: Diagnosis not present

## 2022-06-16 DIAGNOSIS — Z79899 Other long term (current) drug therapy: Secondary | ICD-10-CM | POA: Insufficient documentation

## 2022-06-16 DIAGNOSIS — Z5112 Encounter for antineoplastic immunotherapy: Secondary | ICD-10-CM | POA: Diagnosis not present

## 2022-06-16 DIAGNOSIS — D509 Iron deficiency anemia, unspecified: Secondary | ICD-10-CM | POA: Diagnosis not present

## 2022-06-16 LAB — TSH: TSH: 3.881 u[IU]/mL (ref 0.350–4.500)

## 2022-06-16 LAB — CBC: RBC: 3.33 — AB (ref 3.87–5.11)

## 2022-06-16 LAB — CBC AND DIFFERENTIAL
HCT: 31 — AB (ref 41–53)
Hemoglobin: 10.2 — AB (ref 13.5–17.5)
Neutrophils Absolute: 4.12
Platelets: 104 10*3/uL — AB (ref 150–400)
WBC: 4.9

## 2022-06-16 LAB — BASIC METABOLIC PANEL
BUN: 30 — AB (ref 4–21)
CO2: 28 — AB (ref 13–22)
Chloride: 108 (ref 99–108)
Creatinine: 1.6 — AB (ref 0.6–1.3)
Glucose: 139
Potassium: 4 mEq/L (ref 3.5–5.1)
Sodium: 142 (ref 137–147)

## 2022-06-16 LAB — VITAMIN B12: Vitamin B-12: 304 pg/mL (ref 180–914)

## 2022-06-16 LAB — COMPREHENSIVE METABOLIC PANEL
Albumin: 4 (ref 3.5–5.0)
Calcium: 8.7 (ref 8.7–10.7)

## 2022-06-16 LAB — HEPATIC FUNCTION PANEL
ALT: 35 U/L (ref 10–40)
AST: 25 (ref 14–40)
Alkaline Phosphatase: 82 (ref 25–125)
Bilirubin, Total: 0.7

## 2022-06-16 MED ORDER — HEMOCYTE-PLUS 106-1 MG PO TABS: 1.0000 | ORAL_TABLET | Freq: Every day | ORAL | 5 refills | Status: DC

## 2022-06-17 ENCOUNTER — Encounter: Payer: Self-pay | Admitting: Oncology

## 2022-06-17 ENCOUNTER — Other Ambulatory Visit (HOSPITAL_COMMUNITY): Payer: Self-pay

## 2022-06-17 ENCOUNTER — Other Ambulatory Visit: Payer: Self-pay | Admitting: Oncology

## 2022-06-17 MED FILL — Nivolumab IV Soln 100 MG/10ML: INTRAVENOUS | Qty: 48 | Status: AC

## 2022-06-18 ENCOUNTER — Inpatient Hospital Stay: Payer: Medicare PPO

## 2022-06-18 VITALS — BP 120/45 | HR 72 | Temp 98.2°F | Resp 18 | Wt 177.4 lb

## 2022-06-18 DIAGNOSIS — Z79899 Other long term (current) drug therapy: Secondary | ICD-10-CM | POA: Diagnosis not present

## 2022-06-18 DIAGNOSIS — C4339 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: Secondary | ICD-10-CM

## 2022-06-18 DIAGNOSIS — Z5112 Encounter for antineoplastic immunotherapy: Secondary | ICD-10-CM | POA: Diagnosis not present

## 2022-06-18 DIAGNOSIS — D519 Vitamin B12 deficiency anemia, unspecified: Secondary | ICD-10-CM | POA: Diagnosis not present

## 2022-06-18 MED ORDER — CYANOCOBALAMIN 1000 MCG/ML IJ SOLN
1000.0000 ug | Freq: Once | INTRAMUSCULAR | Status: AC
  Administered 2022-06-18: 1000 ug via INTRAMUSCULAR

## 2022-06-18 MED ORDER — SODIUM CHLORIDE 0.9 % IV SOLN
480.0000 mg | Freq: Once | INTRAVENOUS | Status: AC
  Administered 2022-06-18: 480 mg via INTRAVENOUS
  Filled 2022-06-18: qty 48

## 2022-06-18 MED ORDER — HEPARIN SOD (PORK) LOCK FLUSH 100 UNIT/ML IV SOLN: 500.0000 [IU] | Freq: Once | INTRAVENOUS | Status: DC | PRN

## 2022-06-18 MED ORDER — SODIUM CHLORIDE 0.9% FLUSH: 10.0000 mL | INTRAVENOUS | Status: DC | PRN

## 2022-06-18 MED ORDER — SODIUM CHLORIDE 0.9 % IV SOLN: Freq: Once | INTRAVENOUS | Status: AC

## 2022-06-18 NOTE — Patient Instructions (Signed)
Nivolumab injection °What is this medication? °NIVOLUMAB (nye VOL ue mab) is a monoclonal antibody. It treats certain types of cancer. Some of the cancers treated are colon cancer, head and neck cancer, Hodgkin lymphoma, lung cancer, and melanoma. °This medicine may be used for other purposes; ask your health care provider or pharmacist if you have questions. °COMMON BRAND NAME(S): Opdivo °What should I tell my care team before I take this medication? °They need to know if you have any of these conditions: °Autoimmune diseases such as Crohn's disease, ulcerative colitis, or lupus °Have had or planning to have an allogeneic stem cell transplant (uses someone else's stem cells) °History of chest radiation °Organ transplant °Nervous system problems such as myasthenia gravis or Guillain-Barre syndrome °An unusual or allergic reaction to nivolumab, other medicines, foods, dyes, or preservatives °Pregnant or trying to get pregnant °Breast-feeding °How should I use this medication? °This medication is injected into a vein. It is given in a hospital or clinic setting. °A special MedGuide will be given to you before each treatment. Be sure to read this information carefully each time. °Talk to your care team regarding the use of this medication in children. While it may be prescribed for children as young as 12 years for selected conditions, precautions do apply. °Overdosage: If you think you have taken too much of this medicine contact a poison control center or emergency room at once. °NOTE: This medicine is only for you. Do not share this medicine with others. °What if I miss a dose? °Keep appointments for follow-up doses. It is important not to miss your dose. Call your care team if you are unable to keep an appointment. °What may interact with this medication? °Interactions have not been studied. °This list may not describe all possible interactions. Give your health care provider a list of all the medicines, herbs,  non-prescription drugs, or dietary supplements you use. Also tell them if you smoke, drink alcohol, or use illegal drugs. Some items may interact with your medicine. °What should I watch for while using this medication? °Your condition will be monitored carefully while you are receiving this medication. °You may need blood work done while you are taking this medication. °Do not become pregnant while taking this medication or for 5 months after stopping it. Women should inform their care team if they wish to become pregnant or think they might be pregnant. There is a potential for serious harm to an unborn child. Talk to your care team for more information. Do not breast-feed an infant while taking this medication or for 5 months after stopping it. °What side effects may I notice from receiving this medication? °Side effects that you should report to your care team as soon as possible: °Allergic reactions--skin rash, itching, hives, swelling of the face, lips, tongue, or throat °Bloody or black, tar-like stools °Change in vision °Chest pain °Diarrhea °Dry cough, shortness of breath or trouble breathing °Eye pain °Fast or irregular heartbeat °Fever, chills °High blood sugar (hyperglycemia)--increased thirst or amount of urine, unusual weakness or fatigue, blurry vision °High thyroid levels (hyperthyroidism)--fast or irregular heartbeat, weight loss, excessive sweating or sensitivity to heat, tremors or shaking, anxiety, nervousness, irregular menstrual cycle or spotting °Kidney injury--decrease in the amount of urine, swelling of the ankles, hands, or feet °Liver injury--right upper belly pain, loss of appetite, nausea, light-colored stool, dark yellow or brown urine, yellowing skin or eyes, unusual weakness or fatigue °Low red blood cell count--unusual weakness or fatigue, dizziness, headache, trouble breathing °  Low thyroid levels (hypothyroidism)--unusual weakness or fatigue, increased sensitivity to cold,  constipation, hair loss, dry skin, weight gain, feelings of depression °Mood and behavior changes-confusion, change in sex drive or performance, irritability °Muscle pain or cramps °Pain, tingling, or numbness in the hands or feet, muscle weakness, trouble walking, loss of balance or coordination °Red or dark brown urine °Redness, blistering, peeling, or loosening of the skin, including inside the mouth °Stomach pain °Unusual bruising or bleeding °Side effects that usually do not require medical attention (report to your care team if they continue or are bothersome): °Bone pain °Constipation °Loss of appetite °Nausea °Tiredness °Vomiting °This list may not describe all possible side effects. Call your doctor for medical advice about side effects. You may report side effects to FDA at 1-800-FDA-1088. °Where should I keep my medication? °This medication is given in a hospital or clinic and will not be stored at home. °NOTE: This sheet is a summary. It may not cover all possible information. If you have questions about this medicine, talk to your doctor, pharmacist, or health care provider. °© 2022 [employer] (2021-07-16 00:00:00) ° °

## 2022-06-23 ENCOUNTER — Encounter: Payer: Self-pay | Admitting: Oncology

## 2022-06-23 LAB — MAGNESIUM: Magnesium: 1.8

## 2022-06-26 DIAGNOSIS — E785 Hyperlipidemia, unspecified: Secondary | ICD-10-CM | POA: Diagnosis not present

## 2022-06-26 DIAGNOSIS — E1169 Type 2 diabetes mellitus with other specified complication: Secondary | ICD-10-CM | POA: Diagnosis not present

## 2022-06-26 DIAGNOSIS — I5022 Chronic systolic (congestive) heart failure: Secondary | ICD-10-CM | POA: Diagnosis not present

## 2022-06-26 DIAGNOSIS — I10 Essential (primary) hypertension: Secondary | ICD-10-CM | POA: Diagnosis not present

## 2022-06-26 DIAGNOSIS — I471 Supraventricular tachycardia: Secondary | ICD-10-CM | POA: Diagnosis not present

## 2022-06-26 DIAGNOSIS — J449 Chronic obstructive pulmonary disease, unspecified: Secondary | ICD-10-CM | POA: Diagnosis not present

## 2022-06-26 DIAGNOSIS — E7800 Pure hypercholesterolemia, unspecified: Secondary | ICD-10-CM | POA: Diagnosis not present

## 2022-06-26 DIAGNOSIS — C779 Secondary and unspecified malignant neoplasm of lymph node, unspecified: Secondary | ICD-10-CM | POA: Diagnosis not present

## 2022-06-26 DIAGNOSIS — Z79899 Other long term (current) drug therapy: Secondary | ICD-10-CM | POA: Diagnosis not present

## 2022-06-26 DIAGNOSIS — I4891 Unspecified atrial fibrillation: Secondary | ICD-10-CM | POA: Diagnosis not present

## 2022-07-07 ENCOUNTER — Encounter: Payer: Self-pay | Admitting: Oncology

## 2022-07-11 ENCOUNTER — Other Ambulatory Visit: Payer: Self-pay

## 2022-07-12 ENCOUNTER — Other Ambulatory Visit: Payer: Self-pay | Admitting: Pharmacist

## 2022-07-12 DIAGNOSIS — C4339 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: Secondary | ICD-10-CM

## 2022-07-15 ENCOUNTER — Other Ambulatory Visit: Payer: Self-pay | Admitting: Pharmacist

## 2022-07-15 ENCOUNTER — Ambulatory Visit: Payer: Medicare PPO | Admitting: Hematology and Oncology

## 2022-07-15 ENCOUNTER — Inpatient Hospital Stay: Payer: Medicare PPO

## 2022-07-15 DIAGNOSIS — C4339 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: Secondary | ICD-10-CM

## 2022-07-15 MED FILL — Nivolumab IV Soln 100 MG/10ML: INTRAVENOUS | Qty: 48 | Status: AC

## 2022-07-16 ENCOUNTER — Ambulatory Visit: Payer: Medicare PPO

## 2022-07-16 DIAGNOSIS — I5021 Acute systolic (congestive) heart failure: Secondary | ICD-10-CM | POA: Diagnosis not present

## 2022-07-18 ENCOUNTER — Other Ambulatory Visit: Payer: Self-pay

## 2022-07-21 ENCOUNTER — Encounter: Payer: Self-pay | Admitting: Oncology

## 2022-07-22 ENCOUNTER — Inpatient Hospital Stay: Payer: Medicare PPO

## 2022-07-22 ENCOUNTER — Ambulatory Visit: Payer: Medicare PPO | Admitting: Hematology and Oncology

## 2022-07-23 ENCOUNTER — Encounter: Payer: Self-pay | Admitting: Oncology

## 2022-07-23 ENCOUNTER — Ambulatory Visit: Payer: Medicare PPO

## 2022-07-24 ENCOUNTER — Other Ambulatory Visit: Payer: Medicare PPO | Admitting: Student

## 2022-07-29 ENCOUNTER — Inpatient Hospital Stay: Payer: Medicare PPO

## 2022-07-29 ENCOUNTER — Other Ambulatory Visit: Payer: Self-pay

## 2022-07-29 ENCOUNTER — Inpatient Hospital Stay: Payer: Medicare PPO | Attending: Oncology | Admitting: Hematology and Oncology

## 2022-07-29 ENCOUNTER — Ambulatory Visit: Payer: Medicare PPO | Admitting: Student

## 2022-07-29 ENCOUNTER — Encounter: Payer: Self-pay | Admitting: Hematology and Oncology

## 2022-07-29 ENCOUNTER — Ambulatory Visit: Payer: Medicare PPO | Admitting: Cardiology

## 2022-07-29 DIAGNOSIS — D696 Thrombocytopenia, unspecified: Secondary | ICD-10-CM | POA: Diagnosis not present

## 2022-07-29 DIAGNOSIS — D519 Vitamin B12 deficiency anemia, unspecified: Secondary | ICD-10-CM | POA: Diagnosis not present

## 2022-07-29 DIAGNOSIS — C4339 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: Secondary | ICD-10-CM

## 2022-07-29 DIAGNOSIS — D649 Anemia, unspecified: Secondary | ICD-10-CM | POA: Diagnosis not present

## 2022-07-29 DIAGNOSIS — Z5112 Encounter for antineoplastic immunotherapy: Secondary | ICD-10-CM | POA: Diagnosis not present

## 2022-07-29 DIAGNOSIS — D509 Iron deficiency anemia, unspecified: Secondary | ICD-10-CM | POA: Insufficient documentation

## 2022-07-29 HISTORY — DX: Thrombocytopenia, unspecified: D69.6

## 2022-07-29 LAB — HEPATIC FUNCTION PANEL
ALT: 46 U/L — AB (ref 10–40)
AST: 46 — AB (ref 14–40)
Alkaline Phosphatase: 93 (ref 25–125)
Bilirubin, Total: 0.6

## 2022-07-29 LAB — BASIC METABOLIC PANEL
BUN: 31 — AB (ref 4–21)
CO2: 24 — AB (ref 13–22)
Chloride: 107 (ref 99–108)
Creatinine: 1.4 — AB (ref 0.6–1.3)
Glucose: 140
Potassium: 3.9 mEq/L (ref 3.5–5.1)
Sodium: 140 (ref 137–147)

## 2022-07-29 LAB — VITAMIN B12: Vitamin B-12: 379 pg/mL (ref 180–914)

## 2022-07-29 LAB — CBC AND DIFFERENTIAL
HCT: 32 — AB (ref 41–53)
Hemoglobin: 10.3 — AB (ref 13.5–17.5)
Neutrophils Absolute: 3.71
Platelets: 113 10*3/uL — AB (ref 150–400)
WBC: 4.7

## 2022-07-29 LAB — COMPREHENSIVE METABOLIC PANEL
Albumin: 4.3 (ref 3.5–5.0)
Calcium: 9.1 (ref 8.7–10.7)

## 2022-07-29 LAB — CBC
MCV: 96 — AB (ref 80–94)
RBC: 3.26 — AB (ref 3.87–5.11)

## 2022-07-29 LAB — FOLATE: Folate: 45.5 ng/mL (ref >5.9)

## 2022-07-29 NOTE — Assessment & Plan Note (Addendum)
He has had chronic anemia, but in July was found to have iron deficiency, so was treated with IV iron.  His hemoglobin has improved somewhat since receiving the iron and is back to baseline from February.

## 2022-07-29 NOTE — Assessment & Plan Note (Addendum)
He had missed several B12 injections, but these were resumed and he has continued them monthly.  His hemoglobin had improved with the B12 injections, but then he was found to have be iron deficient.  His hemoglobin is basically back to baseline at this time.

## 2022-07-29 NOTE — Progress Notes (Signed)
[facility]  [address] 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  He had a hypermetabolic right submandibular node on PET, but no evidence of distant metastasis.  His original lesion was diagnosed in March 2016.  He underwent excision, but refused aggressive therapy.  He has been receiving palliative nivolumab since November 2016, with several interruptions. His treatment was on hold from December 2022 to April 2023 due to cardiorespiratory issues. He continues to tolerate nivolumab without difficulty.  CT imaging in August 2023 remains without evidence of metastatic disease.  However, there were increasing bilateral pleural effusions and small volume abdominal pelvic ascites, with stable pericardial effusion, felt to be most likely due to fluid overload.  He is on Entresto.  He does not always stay on schedule with his treatment, but appears stable today.  He sees his cardiologist on Thursday.  He will proceed with nivolumab on Friday.  We will plan to see him back in 4 weeks for repeat clinical assessment prior to his next nivolumab.    B12 deficiency anemia He had missed several B12 injections, but these were resumed and he has continued them monthly.  His hemoglobin had improved with the B12 injections, but then he was found to have be iron deficient.  His hemoglobin is basically back to baseline at this time.  Iron deficiency anemia He has had chronic anemia, but in July was found to have iron deficiency, so was treated with IV iron.  His hemoglobin has improved somewhat since receiving the iron and is back to baseline from February.  Thrombocytopenia ([place]) He is new mild thrombocytopenia of uncertain etiology.  This may be  related to his treatment.  We will continue to monitor this.   Note as above, we will plan to see him back in 4 weeks with a CBC and comprehensive metabolic panel prior to his next nivolumab.  The patient understands the plans discussed today and is in agreement with them.  He knows to contact our office if he develops concerns prior to his next appointment.     Kevin Pickles, PA-C  [facility] [address] [place] [address] Dept: [phone] Dept Fax: [phone]   Orders Placed This Encounter  Procedures   CBC and differential    This external order was created through the Results Console.   CBC    This external order was created through the Results Console.   CBC    This order was created through External Result Entry   Vitamin B12    Standing Status:   Future    Number of Occurrences:   1    Standing Expiration Date:   07/30/2023   Folate    Standing Status:   Future    Number of Occurrences:   1    Standing Expiration Date:   4/33/2951   Basic metabolic panel    This external order was created through the Results Console.   Comprehensive metabolic panel    This external order was created through the Results Console.   Hepatic function panel    This external order was created through the Results Console.      CHIEF COMPLAINT:  CC: Recurrent  melanoma  Current Treatment: Palliative nivolumab every 4 weeks  HISTORY OF PRESENT ILLNESS:  Kevin [name] is an 81 year old with 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  His original lesion was diagnosed in March 2016.  He underwent excision, but refused aggressive surgery.  The lesion was at least 4 mm thick with a Clark's level 4, and a mitotic index 10/mm.  The margins were positive, but wide reexcision revealed no residual melanoma.  He had recurrence within 2 months and the lesion had been steadily growing.  He initially had refused seeing a  medical oncologist, but finally came to [place] for evaluation and treatment in October 2016.  He does have extensive comorbidities including COPD, diabetes, hypertension, coronary artery disease, history of myocardial infarction, and pacemaker with AICD.  PET revealed hypermetabolism in the right submandibular node, but no evidence of distant metastasis.  He was having significant pain and requiring regular hydrocodone doses.  He also had severe disfigurement and avoided going out in public because of the facial lesion.  He has been receiving palliative nivolumab since November 2016 and has had a good response with a decrease in the right facial tumor.  He has had associated hypomagnesemia and hypokalemia, so is on oral magnesium and potassium supplements. He has had mild chronic anemia, which has been stable. He was found to have B12 deficiency and continues B12 injections monthly.  CT head/neck in May 2017 revealed some nonspecific skin thickening in the right face in the area of his melanoma in the right submandibular lymph node had decreased in size, now measuring 6 x 12 mm, but still prominent when compared to the left submandibular node.  He had 15 cycles of nivolumab and relocated to continue to get the same treatment.  He had his 30th dose in February 2018 and then returned to the [place] area.     He was admitted to the hospital in early March 2018 for congestive heart failure associated with pleural and pericardial effusions.  He had no leukocytosis, fever or purulent sputum.  The echocardiogram revealed severe diffuse hypokinesis with mild concentric hypertrophy and markedly depressed ejection fraction of 20 to 25%, associated with moderate mitral regurgitation and moderate left atrial dilatation.  He improved with diuresis.  CT chest in March 2018 revealed stable cardiomegaly and stable small pericardial effusion, but no evidence of immune mediated pneumonitis or metastatic disease.  The previously seen  bilateral pleural effusions had resolved.  He returned to our care and nivolumab every 2 weeks was resumed in April.  CT chest was repeated in early May after 1 month back on therapy.  This was stable, without evidence of recurrent pleural effusions, immunotherapy toxicities, or evidence of metastatic disease.  He was switched to every 28-day nivolumab in August 2018.  He has had stable disease so has continued on nivolumab.  CT imaging in October 2018 revealed a stable 5 mm level 1B cervical lymph node, as well as a stable 8 mm exophytic lesion of the right face.  CT chest did not reveal any evidence of metastatic disease. Borderline cardiomegaly and a small amount of pericardial fluid/thickening was seen, but not reaccumulation of the pleural effusions.  CT head did not reveal any evidence of intracranial metastasis.  There were chronic ischemic changes and evidence of old infarcts.   Unfortunately, his wife passed away in 2018-06-07 and she was his primary caregiver His daughters have been caring for him since then.  CT scans have remained stable, so he  has continued nivolumab every 4 weeks.  He was given mirtazapine for depression and weight loss the dose was increased to 15 mg.  He was then lost to follow-up from December 2021 to April 2022, at which time he resumed nivolumab and B12 every 4 weeks.   CT head, neck and chest in June 2022 did not reveal any evidence of progressive disease.  He was admitted at [facility] in November/December 2022 due to flu and pneumonia.  Nivolumab was placed on hold in December.  CT chest, abdomen and pelvis in February did not reveal any evidence of metastatic disease.  There was new extensive tree in bud nodularity in the right lung with most significant involvement of the right lower lobe. Findings were most consistent with an infectious or inflammatory process in a pattern suggestive of atypical infection including nontuberculous mycobacterium.  He was seen by Dr. [name],  who recommended bronchoscopy, but the patient did not return for follow-up as he did not wish to pursue this procedure.  Nivolumab was resumed again in April.  He was last CT scans in August did not reveal obvious metastatic disease.  There was an increase in his bilateral pleural effusions, stable pericardial effusion and mild ascites, felt to most likely be due to fluid overload.  He is on Entresto and follows with cardiology.   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 ([place])  01/24/2015 Cancer Staging   Staging form: Melanoma of the Skin, AJCC 8th Edition - Clinical stage from 01/24/2015: Stage IIB (cT3b, cN0, cM0) - Signed by [name], MD on 08/19/2021 Histopathologic type: Malignant melanoma, NOS (except juvenile melanoma [id number]) Stage prefix: Initial diagnosis Laterality: Right Lymph-vascular invasion (LVI): LVI not present (absent)/not identified Diagnostic confirmation: Positive histology Specimen type: Excision Staged by: Managing physician Mitotic count: 10 Mitotic unit: mm2 Clark's level: Level IV Tumor-infiltrating lymphocytes: Unknown Neurotropism: Absent Presence of extranodal extension: Absent Breslow depth (mm): 40 Ulceration of the epidermis: Yes Microsatellites: No Primary tumor regression: Unknown Sentinel lymph node biopsy performed: No Matted nodes: No Prognostic indicators: Wide excision negative Stage used in treatment planning: Yes National guidelines used in treatment planning: Yes Type of national guideline used in treatment planning: NCCN   07/18/2020 - 06/18/2022 Chemotherapy   Patient is on Treatment Plan : MELANOMA Nivolumab + B12 q28d     07/18/2020 -  Chemotherapy   Patient is on Treatment Plan : MELANOMA Nivolumab (480) q28d     07/29/2020 Initial Diagnosis   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 ([place])       INTERVAL HISTORY:  Kevin [name] is here today for repeat clinical assessment prior to nivolumab.  He denies any  new symptoms.  He reports stable shortness of breath.  He denies cough or chest pain.  He states he does uses oxygen at night as needed.  He reports this persistent lower extremity edema, which improves overnight.  He has intermittent diarrhea not requiring medication.  He denies itching or skin rash.  He denies abnormal bleeding.  He denies melena or hematochezia.  He denies fevers or chills. He denies pain. His appetite is good. His weight has decreased 6 pounds over last 6 weeks .  He ambulates with a cane.  He states he is up during the day and walks outside as much as he can.  REVIEW OF SYSTEMS:  Review of Systems  Constitutional:  Negative for appetite change, chills, fatigue, fever and unexpected weight change.  HENT:   Negative  for lump/mass, mouth sores and sore throat.   Respiratory:  Positive for shortness of breath (Stable). Negative for cough.   Cardiovascular:  Negative for chest pain and leg swelling.  Gastrointestinal:  Positive for diarrhea (Intermittent, not severe). Negative for abdominal pain, constipation, nausea and vomiting.  Genitourinary:  Negative for difficulty urinating, dysuria, frequency and hematuria.   Musculoskeletal:  Negative for arthralgias, back pain and myalgias.  Skin:  Negative for itching, rash and wound.  Neurological:  Negative for dizziness, extremity weakness, headaches, light-headedness and numbness.  Hematological:  Negative for adenopathy.  Psychiatric/Behavioral:  Negative for depression and sleep disturbance. The patient is not nervous/anxious.      VITALS:  Blood pressure (!) 150/71, pulse 66, temperature 98.1 F (36.7 C), temperature source Oral, resp. rate 20, height '5\' 7"'$  (1.702 m), weight 171 lb 1.6 oz (77.6 kg), SpO2 96 %.  Wt Readings from Last 3 Encounters:  07/29/22 171 lb 1.6 oz (77.6 kg)  06/18/22 177 lb 7 oz (80.5 kg)  06/16/22 179 lb 11.2 oz (81.5 kg)    Body mass index is 26.8 kg/m.  Performance status (ECOG): 1 - Symptomatic  but completely ambulatory  PHYSICAL EXAM:  Physical Exam Vitals and nursing note reviewed.  Constitutional:      General: He is not in acute distress.    Appearance: Normal appearance. He is normal weight.  HENT:     Head: Normocephalic and atraumatic.     Mouth/Throat:     Mouth: Mucous membranes are moist.     Pharynx: Oropharynx is clear. No oropharyngeal exudate or posterior oropharyngeal erythema.  Eyes:     General: No scleral icterus.    Extraocular Movements: Extraocular movements intact.     Conjunctiva/sclera: Conjunctivae normal.     Pupils: Pupils are equal, round, and reactive to light.  Cardiovascular:     Rate and Rhythm: Normal rate and regular rhythm.     Heart sounds: Normal heart sounds. No murmur heard.    No friction rub. No gallop.  Pulmonary:     Effort: Pulmonary effort is normal.     Breath sounds: Examination of the right-lower field reveals decreased breath sounds and rales. Examination of the left-lower field reveals decreased breath sounds and rales. Decreased breath sounds and rales present. No wheezing or rhonchi.  Abdominal:     General: Bowel sounds are normal. There is no distension.     Palpations: Abdomen is soft. There is no fluid wave, hepatomegaly, splenomegaly or mass.     Tenderness: There is no abdominal tenderness.     Comments: Mild edema of the lower abdomen  Musculoskeletal:        General: Normal range of motion.     Cervical back: Normal range of motion and neck supple. No tenderness.     Right lower leg: Edema (2+ to the knees) present.     Left lower leg: Edema (2+ to the knees) present.  Lymphadenopathy:     Cervical: No cervical adenopathy.  Skin:    General: Skin is warm and dry.     Coloration: Skin is not jaundiced.     Findings: No rash.  Neurological:     Mental Status: He is alert and oriented to person, place, and time.     Cranial Nerves: No cranial nerve deficit.  Psychiatric:        Mood and Affect: Mood  normal.        Behavior: Behavior normal.  Thought Content: Thought content normal.     LABS:      Latest Ref Rng & Units 07/29/2022   12:00 AM 06/16/2022   12:00 AM 05/20/2022   12:00 AM  CBC  WBC  4.7     4.9     3.9      Hemoglobin 13.5 - 17.5 10.3     10.2     9.6      Hematocrit 41 - 53 32     31     30      Platelets 150 - 400 K/uL 113     104     136         This result is from an external source.      Latest Ref Rng & Units 07/29/2022   12:00 AM 06/16/2022   12:00 AM 05/20/2022   12:00 AM  CMP  BUN 4 - '21 31     30     29      '$ Creatinine 0.6 - 1.3 1.4     1.6     1.4      Sodium 137 - 147 140     142     139      Potassium 3.5 - 5.1 mEq/L 3.9     4.0     4.5      Chloride 99 - 108 107     108     109      CO2 13 - '22 24     28     19      '$ Calcium 8.7 - 10.7 9.1     8.7     8.9      Alkaline Phos 25 - 125 93     82     49      AST 14 - 40 46     25     26      ALT 10 - 40 U/L 46     35     23         This result is from an external source.     No results found for: "CEA1", "CEA" / No results found for: "CEA1", "CEA" No results found for: "PSA1" No results found for: "ZOX096" No results found for: "CAN125"  No results found for: "TOTALPROTELP", "ALBUMINELP", "A1GS", "A2GS", "BETS", "BETA2SER", "GAMS", "MSPIKE", "SPEI" Lab Results  Component Value Date   TIBC 356 05/21/2022   FERRITIN 46 05/21/2022   IRONPCTSAT 10 (L) 05/21/2022   No results found for: "LDH"  STUDIES:  No results found.    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) ([place])    Malignant  melanoma of skin of cheek (external) (HCC)    Thrombocytopenia ([place]) 07/29/2022    Past Surgical History:  Procedure Laterality Date   APPENDECTOMY     CARDIAC CATHETERIZATION     CORONARY ANGIOPLASTY     PACEMAKER INSERTION     RIGHT/LEFT HEART CATH AND CORONARY ANGIOGRAPHY N/A 10/09/2021  Procedure: RIGHT/LEFT HEART CATH AND CORONARY ANGIOGRAPHY;  Surgeon: Nigel Mormon, MD;  Location: [facility];  Service: Cardiovascular;  Laterality: N/A;    Family History  Problem Relation Age of Onset   Breast cancer Mother    Diabetes Mellitus II Father    Ovarian cancer Sister    Cancer Brother    Cancer Brother    Cancer Maternal Uncle    Breast cancer Niece     Social History:  reports that he has been smoking cigarettes. He has a 30.00 pack-year smoking history. He has never used smokeless tobacco. He reports that he does not currently use alcohol. He reports that he does not currently use drugs.The patient is alone today.  Allergies: No Known Allergies  Current Medications: Current Outpatient Medications  Medication Sig Dispense Refill   tamsulosin (FLOMAX) 0.4 MG CAPS capsule Take by mouth.     albuterol (VENTOLIN HFA) 108 (90 Base) MCG/ACT inhaler Inhale into the lungs.     ALBUTEROL SULFATE PO Take by mouth.     amiodarone (PACERONE) 200 MG tablet Take 1 tablet (200 mg total) by mouth daily. 90 tablet 3   apixaban (ELIQUIS) 5 MG TABS tablet Take 1 tablet (5 mg total) by mouth 2 (two) times daily. 180 tablet 3   atorvastatin (LIPITOR) 80 MG tablet Take 1 tablet (80 mg total) by mouth at bedtime. 90 tablet 3   Fe Fum-FA-B Cmp-C-Zn-Mg-Mn-Cu (HEMOCYTE PLUS) 106-1 MG CAPS Take 1 capsule by mouth daily.     furosemide (LASIX) 40 MG tablet Take 1 tablet (40 mg total) by mouth every other day. 90 tablet 1   magnesium oxide (MAG-OX) 400 (240 Mg) MG tablet Take 1 tablet (400 mg total) by mouth 2 (two) times daily. 60 tablet 5   metFORMIN (GLUCOPHAGE) 1000 MG tablet Take  500 mg by mouth 2 (two) times daily.     metoprolol succinate (TOPROL-XL) 25 MG 24 hr tablet Take 0.5 tablets (12.5 mg total) by mouth daily. 45 tablet 3   ondansetron (ZOFRAN-ODT) 4 MG disintegrating tablet Take 1 tablet by mouth daily as needed.     potassium chloride SA (KLOR-CON M) 20 MEQ tablet Take 1 tablet (20 mEq total) by mouth daily. 90 tablet 3   sacubitril-valsartan (ENTRESTO) 24-26 MG Take 0.5 tablets by mouth 2 (two) times daily. 90 tablet 3   SYMBICORT 80-4.5 MCG/ACT inhaler Inhale into the lungs.     No current facility-administered medications for this visit.

## 2022-07-29 NOTE — Assessment & Plan Note (Signed)
He is new mild thrombocytopenia of uncertain etiology.  This may be related to his treatment.  We will continue to monitor this.

## 2022-07-29 NOTE — Assessment & Plan Note (Addendum)
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 He had a hypermetabolic right submandibular node on PET, but no evidence of distant metastasis. His original lesion was diagnosed in March 2016. He underwent excision, but refused aggressive therapy. He has been receiving palliative nivolumab since November 2016, with several interruptions.His treatment was on hold from December 2022 to April 2023 due to cardiorespiratory issues.Hecontinues to tolerate nivolumabwithout difficulty. CT imaging in August 2023 remainswithout evidence of metastatic disease. However, there were increasing bilateral pleural effusions and small volume abdominal pelvic ascites, with stable pericardial effusion, felt to be most likely due to fluid overload.  He is on Entresto.  He does not always stay on schedule with his treatment, but appears stable today.  He sees his cardiologist on Thursday.  He will proceed with nivolumab on Friday.  We will plan to see him back in 4 weeks for repeat clinical assessment prior to his next nivolumab.

## 2022-07-31 ENCOUNTER — Ambulatory Visit: Payer: Medicare PPO | Admitting: Cardiology

## 2022-07-31 MED FILL — Nivolumab IV Soln 100 MG/10ML: INTRAVENOUS | Qty: 48 | Status: AC

## 2022-08-01 ENCOUNTER — Inpatient Hospital Stay: Payer: Medicare PPO

## 2022-08-01 VITALS — BP 124/54 | HR 101 | Temp 98.1°F | Resp 18 | Ht 67.0 in | Wt 174.0 lb

## 2022-08-01 DIAGNOSIS — C4339 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: Secondary | ICD-10-CM | POA: Diagnosis not present

## 2022-08-01 DIAGNOSIS — Z5112 Encounter for antineoplastic immunotherapy: Secondary | ICD-10-CM | POA: Diagnosis not present

## 2022-08-01 DIAGNOSIS — D509 Iron deficiency anemia, unspecified: Secondary | ICD-10-CM | POA: Diagnosis not present

## 2022-08-01 DIAGNOSIS — D519 Vitamin B12 deficiency anemia, unspecified: Secondary | ICD-10-CM | POA: Diagnosis not present

## 2022-08-01 DIAGNOSIS — D696 Thrombocytopenia, unspecified: Secondary | ICD-10-CM | POA: Diagnosis not present

## 2022-08-01 MED ORDER — SODIUM CHLORIDE 0.9 % IV SOLN: Freq: Once | INTRAVENOUS | Status: AC

## 2022-08-01 MED ORDER — SODIUM CHLORIDE 0.9 % IV SOLN
480.0000 mg | Freq: Once | INTRAVENOUS | Status: AC
  Administered 2022-08-01: 480 mg via INTRAVENOUS
  Filled 2022-08-01 (×2): qty 48

## 2022-08-01 MED ORDER — SODIUM CHLORIDE 0.9% FLUSH
10.0000 mL | INTRAVENOUS | Status: DC | PRN
  Administered 2022-08-01: 10 mL

## 2022-08-01 MED ORDER — HEPARIN SOD (PORK) LOCK FLUSH 100 UNIT/ML IV SOLN
500.0000 [IU] | Freq: Once | INTRAVENOUS | Status: AC | PRN
  Administered 2022-08-01: 500 [IU]

## 2022-08-01 MED ORDER — CYANOCOBALAMIN 1000 MCG/ML IJ SOLN
1000.0000 ug | Freq: Once | INTRAMUSCULAR | Status: AC
  Administered 2022-08-01: 1000 ug via INTRAMUSCULAR
  Filled 2022-08-01: qty 1

## 2022-08-01 NOTE — Patient Instructions (Signed)
Kevin [name]  Discharge Instructions: Thank you for choosing [facility] to provide your oncology and hematology care.  If you have a lab appointment with the [facility], please go directly to the [facility] and check in at the registration area.   Wear comfortable clothing and clothing appropriate for easy access to any Portacath or PICC line.   We strive to give you quality time with your provider. You may need to reschedule your appointment if you arrive late (15 or more minutes).  Arriving late affects you and other patients whose appointments are after yours.  Also, if you miss three or more appointments without notifying the office, you may be dismissed from the clinic at the provider's discretion.      For prescription refill requests, have your pharmacy contact our office and allow 72 hours for refills to be completed.    Today you received the following chemotherapy and/or immunotherapy agents NIVOLUMABNivolumab Injection What is this medication? NIVOLUMAB (nye VOL ue mab) treats some types of cancer. It works by helping your immune system slow or stop the spread of cancer cells. It is a monoclonal antibody. This medicine may be used for other purposes; ask your health care provider or pharmacist if you have questions. COMMON BRAND NAME(S): Opdivo What should I tell my care team before I take this medication? They need to know if you have any of these conditions: Allogeneic stem cell transplant (uses someone else's stem cells) Autoimmune diseases, such as Crohn disease, ulcerative colitis, lupus History of chest radiation Nervous system problems, such as Guillain-Barre syndrome or myasthenia gravis Organ transplant An unusual or allergic reaction to nivolumab, other medications, foods, dyes, or preservatives Pregnant or trying to get pregnant Breast-feeding How should I use this medication? This medication is infused into a vein. It is  given in a hospital or clinic setting. A special MedGuide will be given to you before each treatment. Be sure to read this information carefully each time. Talk to your care team about the use of this medication in children. While it may be prescribed for children as young as 12 years for selected conditions, precautions do apply. Overdosage: If you think you have taken too much of this medicine contact a poison control center or emergency room at once. NOTE: This medicine is only for you. Do not share this medicine with others. What if I miss a dose? Keep appointments for follow-up doses. It is important not to miss your dose. Call your care team if you are unable to keep an appointment. What may interact with this medication? Interactions have not been studied. This list may not describe all possible interactions. Give your health care provider a list of all the medicines, herbs, non-prescription drugs, or dietary supplements you use. Also tell them if you smoke, drink alcohol, or use illegal drugs. Some items may interact with your medicine. What should I watch for while using this medication? Your condition will be monitored carefully while you are receiving this medication. You may need blood work while taking this medication. This medication may cause serious skin reactions. They can happen weeks to months after starting the medication. Contact your care team right away if you notice fevers or flu-like symptoms with a rash. The rash may be red or purple and then turn into blisters or peeling of the skin. You may also notice a red rash with swelling of the face, lips, or lymph nodes in your neck or  under your arms. Tell your care team right away if you have any change in your eyesight. Talk to your care team if you are pregnant or think you might be pregnant. A negative pregnancy test is required before starting this medication. A reliable form of contraception is recommended while taking this  medication and for 5 months after the last dose. Talk to your care team about effective forms of contraception. Do not breast-feed while taking this medication and for 5 months after the last dose. What side effects may I notice from receiving this medication? Side effects that you should report to your care team as soon as possible: Allergic reactions--skin rash, itching, hives, swelling of the face, lips, tongue, or throat Dry cough, shortness of breath or trouble breathing Eye pain, redness, irritation, or discharge with blurry or decreased vision Heart muscle inflammation--unusual weakness or fatigue, shortness of breath, chest pain, fast or irregular heartbeat, dizziness, swelling of the ankles, feet, or hands Hormone gland problems--headache, sensitivity to light, unusual weakness or fatigue, dizziness, fast or irregular heartbeat, increased sensitivity to cold or heat, excessive sweating, constipation, hair loss, increased thirst or amount of urine, tremors or shaking, irritability Infusion reactions--chest pain, shortness of breath or trouble breathing, feeling faint or lightheaded Kidney injury (glomerulonephritis)--decrease in the amount of urine, red or dark brown urine, foamy or bubbly urine, swelling of the ankles, hands, or feet Liver injury--right upper belly pain, loss of appetite, nausea, light-colored stool, dark yellow or brown urine, yellowing skin or eyes, unusual weakness or fatigue Pain, tingling, or numbness in the hands or feet, muscle weakness, change in vision, confusion or trouble speaking, loss of balance or coordination, trouble walking, seizures Rash, fever, and swollen lymph nodes Redness, blistering, peeling, or loosening of the skin, including inside the mouth Sudden or severe stomach pain, bloody diarrhea, fever, nausea, vomiting Side effects that usually do not require medical attention (report these to your care team if they continue or are bothersome): Bone,  joint, or muscle pain Diarrhea Fatigue Loss of appetite Nausea Skin rash This list may not describe all possible side effects. Call your doctor for medical advice about side effects. You may report side effects to FDA at 1-800-FDA-1088. Where should I keep my medication? This medication is given in a hospital or clinic. It will not be stored at home. NOTE: This sheet is a summary. It may not cover all possible information. If you have questions about this medicine, talk to your doctor, pharmacist, or health care provider.  2023 Elsevier/Gold Standard (2021-09-27 00:00:00) Antibiotic Medicine, Adult  Antibiotic medicines treat infections caused by a type of germ called bacteria. These medicines work by killing the bacteria that make you sick. You should take antibiotic medicines safely and only when needed. When do I need to take antibiotics? You may need antibiotics for: A urinary tract infection (UTI). Strep throat. A sinus infection caused by bacteria. Meningitis. This affects the spinal cord and brain. A bad lung infection. You may start your medicines while your doctor waits for your results on some tests. When your results come back, your doctor may change or stop your medicine based on your test results. When are antibiotics not needed? You do not need these medicines for most common illnesses, such as: A cold. The flu. A sore throat. Mucus being an odd color. Bronchitis. Sometimes, antibiotics are not needed for an infection caused by bacteria. Do not ask for these medicines, or take them, when they are not needed. How long  should I take my antibiotic? You need to take all your medicine. Take your antibiotic medicine as told by your doctor. Do not stop taking the antibiotic even if you start to feel better. If you stop taking it too soon: You may feel sick again. Your infection may get harder to treat. Antibiotics need different amounts of time to work. Some treatments last  just a few days. Some last about a week to 10 days. Sometimes, you may need to take antibiotics for a few weeks to fully treat your infection. What if I miss a dose? Try not to miss a dose. If you miss a dose, call your doctor or pharmacist. Sometimes, it is okay to take the missed dose as soon as you can. Do not take an extra dose. What are the risks of taking antibiotics? Antibiotics can cause: Allergic attacks. A feeling like you may vomit (nausea). Yeast infections. Liver problems. These medicines can cause an infection called C. diff. This causes watery poop (diarrhea). This happens when antibiotics kill good germs in your gut. This lets C. diff grow. Tell your doctor right away if: You get watery poop while taking your antibiotic. You get watery poop after you stop your antibiotic. C. diff can happen weeks after you stop your medicine. You also have a risk of getting an infection in the future that antibiotics cannot treat (antibiotic-resistant infection). These infections can get very bad. Sometimes, they can be life-threatening. Do antibiotics affect birth control? Birth control pills may not work. If you take birth control pills: Keep taking them as normal. Use a second form of birth control, such as a condom. Do this for as long as told by your doctor. What else should I know about taking antibiotics? You need to take these medicines exactly as told. Make sure to do these things: Take the right amount of medicine at the same time each day. Ask your doctor: How long to wait between doses. If you should take your medicine with food. If you should stay away from some foods, drinks, or medicines. What side effects you should watch for. Use only the medicines that your doctor said to use. Do not use medicines that were given to someone else. Drink a large glass of water when you take your medicine. Drink enough fluid to keep your pee (urine) pale yellow. Ask your pharmacist for a tool  to measure your medicine. This may be a syringe, cup, or spoon. Throw out any extra medicine. Follow these instructions at home: Take over-the-counter and prescription medicines as told by your doctor. Return to your normal activities as told by your doctor. Ask your doctor what activities are safe for you. Keep all follow-up visits as told by your doctor. This is important. Contact a doctor if: You feel worse. You have one of these after you start your medicine: New joint pain. New muscle aches. You have side effects from your medicine, such as: Stomach pain. Watery poop. Feeling like you may vomit. White patches in your mouth or throat. Get help right away if: You have a very bad allergic attack. If this happens, stop taking your medicine right away. You may get: Hives. These are raised, itchy, red bumps on your skin. Skin rash. Trouble breathing. Breathing that has whistling sounds. Swelling on your body. A dizzy feeling. Vomiting. You have symptoms of liver problems. You may have: Dark pee, or pee that is the color of blood. Yellow skin. Easy bruising. Easy bleeding. You have very  bad watery poop. You have cramps in your belly. You have a very bad headache. These symptoms may be an emergency. Do not wait to see if the symptoms will go away. Get medical help right away. Call your local emergency services (911 in the [place] Do not drive yourself to the hospital. Summary Antibiotics are used to treat infections caused by bacteria. Take these medicines safely and only when needed. Your doctor may change or stop your medicine based on your test results. Take all your medicine even when you feel better. This information is not intended to replace advice given to you by your health care provider. Make sure you discuss any questions you have with your health care provider. Document Revised: 08/09/2019 Document Reviewed: 08/16/2019 Elsevier Patient Education  [employer].        To help prevent nausea and vomiting after your treatment, we encourage you to take your nausea medication as directed.  BELOW ARE SYMPTOMS THAT SHOULD BE REPORTED IMMEDIATELY: *FEVER GREATER THAN 100.4 F (38 C) OR HIGHER *CHILLS OR SWEATING *NAUSEA AND VOMITING THAT IS NOT CONTROLLED WITH YOUR NAUSEA MEDICATION *UNUSUAL SHORTNESS OF BREATH *UNUSUAL BRUISING OR BLEEDING *URINARY PROBLEMS (pain or burning when urinating, or frequent urination) *BOWEL PROBLEMS (unusual diarrhea, constipation, pain near the anus) TENDERNESS IN MOUTH AND THROAT WITH OR WITHOUT PRESENCE OF ULCERS (sore throat, sores in mouth, or a toothache) UNUSUAL RASH, SWELLING OR PAIN  UNUSUAL VAGINAL DISCHARGE OR ITCHING   Items with * indicate a potential emergency and should be followed up as soon as possible or go to the Emergency Department if any problems should occur.  Please show the CHEMOTHERAPY ALERT CARD or IMMUNOTHERAPY ALERT CARD at check-in to the Emergency Department and triage nurse.  Should you have questions after your visit or need to cancel or reschedule your appointment, please contact [facility]  Dept: [phone]  and follow the prompts.  Office hours are 8:00 a.m. to 4:30 p.m. Monday - Friday. Please note that voicemails left after 4:00 p.m. may not be returned until the following business day.  We are closed weekends and major holidays. You have access to a nurse at all times for urgent questions. Please call the main number to the clinic Dept: [phone] and follow the prompts.  For any non-urgent questions, you may also contact your provider using MyChart. We now offer e-Visits for anyone 22 and older to request care online for non-urgent symptoms. For details visit [email or web address].   Also download the MyChart app! Go to the app store, search "MyChart", open the app, select [facility], and log in with your MyChart username and password.  Masks are optional  in the cancer centers. If you would like for your care team to wear a mask while they are taking care of you, please let them know. You may have one support person who is at least 80 years old accompany you for your appointments.

## 2022-08-14 ENCOUNTER — Encounter: Payer: Self-pay | Admitting: Cardiology

## 2022-08-14 ENCOUNTER — Ambulatory Visit: Payer: Medicare PPO | Admitting: Cardiology

## 2022-08-14 VITALS — BP 111/48 | HR 50 | Temp 98.3°F | Resp 16 | Ht 67.0 in | Wt 170.0 lb

## 2022-08-14 DIAGNOSIS — I5022 Chronic systolic (congestive) heart failure: Secondary | ICD-10-CM

## 2022-08-14 DIAGNOSIS — R0609 Other forms of dyspnea: Secondary | ICD-10-CM

## 2022-08-14 DIAGNOSIS — J449 Chronic obstructive pulmonary disease, unspecified: Secondary | ICD-10-CM

## 2022-08-14 DIAGNOSIS — Z66 Do not resuscitate: Secondary | ICD-10-CM | POA: Diagnosis not present

## 2022-08-14 DIAGNOSIS — I480 Paroxysmal atrial fibrillation: Secondary | ICD-10-CM | POA: Diagnosis not present

## 2022-08-14 DIAGNOSIS — I10 Essential (primary) hypertension: Secondary | ICD-10-CM

## 2022-08-14 NOTE — Progress Notes (Signed)
Primary Physician/Referring:  [name], PA-C  Patient ID: Kevin [name], male    DOB: [birth year], 81 y.o.   MRN: [id number]  No chief complaint on file.  HPI:    Kevin [name]  is a 81 y.o. Caucasian male  with hypertension, type 2 DM, COPD with ongoing tobacco use and severe nicotine dependence, CAD s/p ?MI/PCI (2015), ischemic cardiomyopathy with previously known LVEF 25%, s/p dual chamber ICD (St Jude), h/o ventricular tachycardia SP dual-chamber ICD implantation with Abbott device, stage IIIa chronic kidney disease, melanoma-currently on monthly chemotherapy.   Patient had been extremely ill since #2022 when he presented with A-fib with RVR, respiratory failure and COPD exacerbation.  However although developed acute renal failure and multiple medical complex issues, he has fortunately recovered well.  He has now resumed chemotherapy for malignant melanoma and appears to be doing well.  Patient is accompanied by his daughter.  He has no specific complaints today.  States that his leg edema is stable.  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)    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)    Thrombocytopenia ([place]) 07/29/2022   Past Surgical History:  Procedure Laterality Date   APPENDECTOMY     CARDIAC CATHETERIZATION     CORONARY ANGIOPLASTY     PACEMAKER INSERTION     RIGHT/LEFT HEART CATH AND CORONARY ANGIOGRAPHY N/A 10/09/2021   Procedure: RIGHT/LEFT HEART CATH AND CORONARY ANGIOGRAPHY;  Surgeon: Nigel Mormon, MD;  Location: [facility];  Service:  Cardiovascular;  Laterality: N/A;   Family History  Problem Relation Age of Onset   Breast cancer Mother    Diabetes Mellitus II Father    Ovarian cancer Sister    Cancer Brother    Cancer Brother    Cancer Maternal Uncle    Breast cancer Niece     Social History   Tobacco Use   Smoking status: Some Days    Packs/day: 0.50    Years: 60.00    Total pack years: 30.00    Types: Cigarettes   Smokeless tobacco: Never  Substance Use Topics   Alcohol use: Not Currently   Marital Status: Married   ROS  Review of Systems  Cardiovascular:  Positive for dyspnea on exertion and leg swelling. Negative for chest pain.    Objective  Blood pressure (!) 111/48, pulse (!) 50, temperature 98.3 F (36.8 C), temperature source Temporal, resp. rate 16, height '5\' 7"'  (1.702 m), weight 170 lb (77.1 kg), head circumference 16" (40.6 cm), SpO2 97 %.     08/14/2022    3:10 PM 08/01/2022    2:05 PM 08/01/2022    1:05 PM  Vitals with BMI  Height '5\' 7"'   '5\' 7"'   Weight 170 lbs  174 lbs  BMI 50.09  38.18  Systolic 299 371 696  Diastolic 48 54 50  Pulse 50 101 101      Physical Exam Vitals reviewed.  Constitutional:      Comments: Frail  Neck:     Vascular: No carotid  bruit or JVD.  Cardiovascular:     Rate and Rhythm: Normal rate and regular rhythm.     Pulses: Decreased pulses.          Dorsalis pedis pulses are 0 on the right side and 0 on the left side.       Posterior tibial pulses are 0 on the right side and 0 on the left side.     Heart sounds: S1 normal and S2 normal. Murmur heard.     Midsystolic murmur is present with a grade of 2/6 radiating to the apex.     No gallop.  Pulmonary:     Effort: Pulmonary effort is normal. No respiratory distress.     Breath sounds: No wheezing, rhonchi or rales.     Comments: No supplemental oxygen Abdominal:     General: Bowel sounds are normal.     Palpations: Abdomen is soft.  Musculoskeletal:     Right lower leg: Edema (2+ pitting to  knee) present.     Left lower leg: Edema (2+ pitting to knee) present.     Laboratory examination:   Recent Labs    10/19/21 0132 10/20/21 0029 10/21/21 0036 12/13/21 0000 02/04/22 1152 02/12/22 0000 05/20/22 0000 06/16/22 0000 07/29/22 0000  NA 135 138 139   < > 146*   < > 139 142 140  K 4.2 4.0 3.9   < > 3.2*   < > 4.5 4.0 3.9  CL 98 101 97*   < > 102   < > 109* 108 107  CO2 30 32 35*   < > 32*   < > 19 28* 24*  GLUCOSE 190* 213* 168*  --  156*  --   --   --   --   BUN 43* 28* 23   < > 12   < > 29* 30* 31*  CREATININE 1.39* 1.35* 1.33*   < > 1.15   < > 1.4* 1.6* 1.4*  CALCIUM 8.2* 8.3* 8.2*   < > 9.0   < > 8.9 8.7 9.1  GFRNONAA 52* 53* 54*  --   --   --   --   --   --    < > = values in this interval not displayed.   estimated creatinine clearance is 39.3 mL/min (A) (by C-G formula based on SCr of 1.4 mg/dL (A)).     Latest Ref Rng & Units 07/29/2022   12:00 AM 06/16/2022   12:00 AM 05/20/2022   12:00 AM  CMP  BUN 4 - '21 31     30     29      ' Creatinine 0.6 - 1.3 1.4     1.6     1.4      Sodium 137 - 147 140     142     139      Potassium 3.5 - 5.1 mEq/L 3.9     4.0     4.5      Chloride 99 - 108 107     108     109      CO2 13 - '22 24     28     19      ' Calcium 8.7 - 10.7 9.1     8.7     8.9      Alkaline Phos 25 - 125 93     82     49      AST 14 -  40 46     25     26      ALT 10 - 40 U/L 46     35     23         This result is from an external source.      Latest Ref Rng & Units 07/29/2022   12:00 AM 06/16/2022   12:00 AM 05/20/2022   12:00 AM  Hepatic Function  Albumin 3.5 - 5.0 4.3     4.0     4.0      AST 14 - 40 46     25     26      ALT 10 - 40 U/L 46     35     23      Alk Phosphatase 25 - 125 93     82     49         This result is from an external source.       Latest Ref Rng & Units 07/29/2022   12:00 AM 06/16/2022   12:00 AM 05/20/2022   12:00 AM  CBC  WBC  4.7     4.9     3.9      Hemoglobin 13.5 - 17.5 10.3     10.2     9.6      Hematocrit  41 - 53 32     31     30      Platelets 150 - 400 K/uL 113     104     136         This result is from an external source.    Lipid Panel No results for input(s): "CHOL", "TRIG", "[place]", "VLDL", "HDL", "CHOLHDL", "LDLDIRECT" in the last 8760 hours.  HEMOGLOBIN A1C Lab Results  Component Value Date   HGBA1C 6.7 (H) 10/07/2021   MPG 146 10/07/2021   TSH Recent Labs    04/16/22 1428 05/20/22 1351 06/16/22 1420  TSH 3.634 3.387 3.881    External labs:  10/31/2021: BUN 17, creatinine 1.09, GFR 69, sodium 142, potassium 4.7 BNP 396.8 Hemoglobin 10.2, hematocrit 31.8, MCV 94, platelet 177  Allergies  No Known Allergies   Medication list   Current Outpatient Medications:    albuterol (VENTOLIN HFA) 108 (90 Base) MCG/ACT inhaler, Inhale into the lungs., Disp: , Rfl:    ALBUTEROL SULFATE PO, Take by mouth., Disp: , Rfl:    amiodarone (PACERONE) 200 MG tablet, Take 1 tablet (200 mg total) by mouth daily., Disp: 90 tablet, Rfl: 3   apixaban (ELIQUIS) 5 MG TABS tablet, Take 1 tablet (5 mg total) by mouth 2 (two) times daily., Disp: 180 tablet, Rfl: 3   atorvastatin (LIPITOR) 80 MG tablet, Take 1 tablet (80 mg total) by mouth at bedtime., Disp: 90 tablet, Rfl: 3   Fe Fum-FA-B Cmp-C-Zn-Mg-Mn-Cu (HEMOCYTE PLUS) 106-1 MG CAPS, Take 1 capsule by mouth daily., Disp: , Rfl:    furosemide (LASIX) 40 MG tablet, Take 1 tablet (40 mg total) by mouth every other day., Disp: 90 tablet, Rfl: 1   magnesium oxide (MAG-OX) 400 (240 Mg) MG tablet, Take 1 tablet (400 mg total) by mouth 2 (two) times daily., Disp: 60 tablet, Rfl: 5   metFORMIN (GLUCOPHAGE) 1000 MG tablet, Take 500 mg by mouth 2 (two) times daily., Disp: , Rfl:    metoprolol succinate (TOPROL-XL) 25 MG 24 hr tablet, Take 0.5 tablets (12.5 mg total) by mouth daily., Disp: 45  tablet, Rfl: 3   ondansetron (ZOFRAN-ODT) 4 MG disintegrating tablet, Take 1 tablet by mouth daily as needed., Disp: , Rfl:    potassium chloride SA (KLOR-CON M)  20 MEQ tablet, Take 1 tablet (20 mEq total) by mouth daily., Disp: 90 tablet, Rfl: 3   sacubitril-valsartan (ENTRESTO) 24-26 MG, Take 0.5 tablets by mouth 2 (two) times daily., Disp: 90 tablet, Rfl: 3   SYMBICORT 80-4.5 MCG/ACT inhaler, Inhale into the lungs., Disp: , Rfl:    tamsulosin (FLOMAX) 0.4 MG CAPS capsule, Take by mouth. (Patient not taking: Reported on 08/14/2022), Disp: , Rfl:    Radiology:   No results found.  Cardiac Studies:  PCV ECHOCARDIOGRAM COMPLETE 01/02/2022 Left ventricle cavity is moderately dilated. Normal left ventricular wall thickness. Abnormal septal wall motion due to right ventricle pacemaker. Severe global hypokinesis. LVEF <20%. No evidence of LV thrombus on this noncontrast study. Doppler evidence of grade II (pseudonormal) diastolic dysfunction, elevated LAP. Calculated EF 29%. Left atrial cavity is mildly dilated. Structurally normal trileaflet aortic valve.  Moderate (Grade II) aortic regurgitation. Structurally normal mitral valve.  Moderate to severe mitral regurgitation. Structurally normal tricuspid valve.  Mild to moderate tricuspid regurgitation. Estimated pulmonary artery systolic pressure 55 mmHg. Small pericardial effusion most adjacent to right atrium. There is no hemodynamic significance. Previous study on 10/07/2021 reported mild MR. No other significant change noted.  Right/left heart catheterization and coronary angiography 10/09/2021: LM: Normal LAD: [place] mid LAD stent. No significant disease Lcx: No significant disease RCA: Ostial 100% occlusion. Left-to-right collateral up to mid RCA   RA: 11 mmHg RV: 51/0 mmHg PA: 64/22 mmHg, mPAP 34 mmHg PCW: 23 mmHg   CO: 3.6 L/min CI: 2.0 L/min/m2   Impression: Mildly decompensated nonischemic cardiomyopathy LV size and function out of proportion to RCA occlusion and patent mid LAD stent Recommend GDMT for heart failure   ICD: Saint Jude/Abbott dual-chamber defibrillator  ICD being  followed by [employer].  We will request release.  A letter is sent to Dr. Mahala Menghini today.   EKG:   EKG 08/14/2022: Atrially paced rhythm at rate of 61 bpm, demand a pacing with first-degree AV block.  LBBB.  No further analysis.01/31/2022 sinus rhythm with left axis and left bundle branch block, no further analysis.  Assessment     ICD-10-CM   1. Chronic systolic heart failure (HCC)  I50.22     2. Paroxysmal atrial fibrillation (HCC)  I48.0 EKG 12-Lead    3. DOE (dyspnea on exertion)  R06.09     4. Primary hypertension  I10     5. DNR (do not resuscitate)  Z66        There are no discontinued medications.    No orders of the defined types were placed in this encounter.   Recommendations:   Kevin [name] is a 81 y.o. Caucasian male  with hypertension, type 2 DM, COPD with ongoing tobacco use and severe nicotine dependence, CAD s/p ?MI/PCI (2015), ischemic cardiomyopathy with previously known LVEF 25%, s/p dual chamber ICD (St Jude), h/o ventricular tachycardia SP dual-chamber ICD implantation with Abbott device, stage IIIa chronic kidney disease, melanoma-currently on monthly chemotherapy.   Patient had been extremely ill since #2022 when he presented with A-fib with RVR, respiratory failure and COPD exacerbation.  However although developed acute renal failure and multiple medical complex issues, he has fortunately recovered well.  He has now resumed chemotherapy for malignant melanoma and appears to be doing well.  He still continues to smoke.  He denies any worsening dyspnea, chronic leg edema persist.  I suspect his leg edema is mostly related to dependent edema and venous insufficiency.  Advised him to continue to restrict his salt intake and try to keep his foot elevated.  Fortunately there is no acute decompensated heart failure, serum creatinine has remained stable, blood counts are stable as well.  Continue low-dose metoprolol along with low-dose Entresto  for now, no room for titrating his medications.  Patient's daughter Kevin [name] is present.  I discussed with the patient regarding end-of-life issues and goals of therapy.  Patient does not want any life-sustaining measures or heroic measures to keep his life going, wants to be DNR.  MOST form was filled today.  I spent additional 15 minutes in filling the form in addition to 35 minutes of discussion regarding his acute heart failure, chronic medical conditions and review of his medications and outside labs.  This was a 40-minute office visit encounter with a 30-minute spent discussions regarding end-of-life issues.  I would like to see him back in 3 months for follow-up.      Adrian Prows, PA-C 08/14/2022, 10:17 PM Office: [phone]

## 2022-08-14 NOTE — Addendum Note (Signed)
Addended by: Kela Millin on: 08/14/2022 10:33 PM   Modules accepted: Orders

## 2022-08-15 DIAGNOSIS — I5021 Acute systolic (congestive) heart failure: Secondary | ICD-10-CM | POA: Diagnosis not present

## 2022-08-22 ENCOUNTER — Other Ambulatory Visit: Payer: Self-pay | Admitting: Oncology

## 2022-08-22 DIAGNOSIS — C4339 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: Secondary | ICD-10-CM

## 2022-08-25 ENCOUNTER — Encounter: Payer: Self-pay | Admitting: Oncology

## 2022-08-27 ENCOUNTER — Inpatient Hospital Stay: Payer: Medicare PPO

## 2022-08-27 ENCOUNTER — Inpatient Hospital Stay: Payer: Medicare PPO | Attending: Oncology | Admitting: Oncology

## 2022-08-27 ENCOUNTER — Encounter: Payer: Self-pay | Admitting: Oncology

## 2022-08-27 DIAGNOSIS — Z23 Encounter for immunization: Secondary | ICD-10-CM | POA: Insufficient documentation

## 2022-08-27 DIAGNOSIS — D509 Iron deficiency anemia, unspecified: Secondary | ICD-10-CM | POA: Diagnosis not present

## 2022-08-27 DIAGNOSIS — D696 Thrombocytopenia, unspecified: Secondary | ICD-10-CM | POA: Insufficient documentation

## 2022-08-27 DIAGNOSIS — D649 Anemia, unspecified: Secondary | ICD-10-CM | POA: Diagnosis not present

## 2022-08-27 DIAGNOSIS — Z79899 Other long term (current) drug therapy: Secondary | ICD-10-CM | POA: Diagnosis not present

## 2022-08-27 DIAGNOSIS — Z5112 Encounter for antineoplastic immunotherapy: Secondary | ICD-10-CM | POA: Insufficient documentation

## 2022-08-27 DIAGNOSIS — D519 Vitamin B12 deficiency anemia, unspecified: Secondary | ICD-10-CM | POA: Insufficient documentation

## 2022-08-27 DIAGNOSIS — C4339 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: Secondary | ICD-10-CM | POA: Diagnosis not present

## 2022-08-27 LAB — COMPREHENSIVE METABOLIC PANEL
Albumin: 4.1 (ref 3.5–5.0)
Calcium: 9 (ref 8.7–10.7)

## 2022-08-27 LAB — CBC AND DIFFERENTIAL
HCT: 31 — AB (ref 41–53)
Hemoglobin: 10.1 — AB (ref 13.5–17.5)
Neutrophils Absolute: 3.74
Platelets: 130 10*3/uL — AB (ref 150–400)
WBC: 4.8

## 2022-08-27 LAB — BASIC METABOLIC PANEL
BUN: 24 — AB (ref 4–21)
CO2: 30 — AB (ref 13–22)
Chloride: 107 (ref 99–108)
Creatinine: 1.6 — AB (ref 0.6–1.3)
Glucose: 185
Potassium: 4.3 mEq/L (ref 3.5–5.1)
Sodium: 140 (ref 137–147)

## 2022-08-27 LAB — HEPATIC FUNCTION PANEL
ALT: 33 U/L (ref 10–40)
AST: 28 (ref 14–40)
Alkaline Phosphatase: 114 (ref 25–125)
Bilirubin, Total: 0.7

## 2022-08-27 LAB — CBC: RBC: 3.17 — AB (ref 3.87–5.11)

## 2022-08-27 LAB — TSH: TSH: 4.084 u[IU]/mL (ref 0.350–4.500)

## 2022-08-27 MED FILL — Nivolumab IV Soln 100 MG/10ML: INTRAVENOUS | Qty: 48 | Status: AC

## 2022-08-28 LAB — T4: T4, Total: 9.3 ug/dL (ref 4.5–12.0)

## 2022-08-29 ENCOUNTER — Inpatient Hospital Stay: Payer: Medicare PPO

## 2022-08-29 VITALS — BP 140/76 | HR 93 | Temp 97.5°F | Resp 20 | Wt 168.0 lb

## 2022-08-29 DIAGNOSIS — Z23 Encounter for immunization: Secondary | ICD-10-CM | POA: Diagnosis not present

## 2022-08-29 DIAGNOSIS — D509 Iron deficiency anemia, unspecified: Secondary | ICD-10-CM | POA: Diagnosis not present

## 2022-08-29 DIAGNOSIS — D519 Vitamin B12 deficiency anemia, unspecified: Secondary | ICD-10-CM | POA: Diagnosis not present

## 2022-08-29 DIAGNOSIS — C4339 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: Secondary | ICD-10-CM | POA: Diagnosis not present

## 2022-08-29 DIAGNOSIS — Z5112 Encounter for antineoplastic immunotherapy: Secondary | ICD-10-CM | POA: Diagnosis not present

## 2022-08-29 DIAGNOSIS — Z79899 Other long term (current) drug therapy: Secondary | ICD-10-CM | POA: Diagnosis not present

## 2022-08-29 DIAGNOSIS — D696 Thrombocytopenia, unspecified: Secondary | ICD-10-CM | POA: Diagnosis not present

## 2022-08-29 MED ORDER — HEPARIN SOD (PORK) LOCK FLUSH 100 UNIT/ML IV SOLN
500.0000 [IU] | Freq: Once | INTRAVENOUS | Status: AC | PRN
  Administered 2022-08-29: 500 [IU]

## 2022-08-29 MED ORDER — SODIUM CHLORIDE 0.9 % IV SOLN: Freq: Once | INTRAVENOUS | Status: AC

## 2022-08-29 MED ORDER — SODIUM CHLORIDE 0.9% FLUSH
10.0000 mL | INTRAVENOUS | Status: DC | PRN
  Administered 2022-08-29: 10 mL

## 2022-08-29 MED ORDER — CYANOCOBALAMIN 1000 MCG/ML IJ SOLN
1000.0000 ug | Freq: Once | INTRAMUSCULAR | Status: AC
  Administered 2022-08-29: 1000 ug via INTRAMUSCULAR
  Filled 2022-08-29: qty 1

## 2022-08-29 MED ORDER — SODIUM CHLORIDE 0.9 % IV SOLN
480.0000 mg | Freq: Once | INTRAVENOUS | Status: AC
  Administered 2022-08-29: 480 mg via INTRAVENOUS
  Filled 2022-08-29: qty 48

## 2022-08-29 MED ORDER — INFLUENZA VAC SPLIT QUAD 0.5 ML IM SUSY
0.5000 mL | PREFILLED_SYRINGE | Freq: Once | INTRAMUSCULAR | Status: AC
  Administered 2022-08-29: 0.5 mL via INTRAMUSCULAR
  Filled 2022-08-29: qty 0.5

## 2022-08-29 NOTE — Patient Instructions (Signed)
Nivolumab Injection What is this medication? NIVOLUMAB (nye VOL ue mab) treats some types of cancer. It works by helping your immune system slow or stop the spread of cancer cells. It is a monoclonal antibody. This medicine may be used for other purposes; ask your health care provider or pharmacist if you have questions. COMMON BRAND NAME(S): Opdivo What should I tell my care team before I take this medication? They need to know if you have any of these conditions: Allogeneic stem cell transplant (uses someone else's stem cells) Autoimmune diseases, such as Crohn disease, ulcerative colitis, lupus History of chest radiation Nervous system problems, such as Guillain-Barre syndrome or myasthenia gravis Organ transplant An unusual or allergic reaction to nivolumab, other medications, foods, dyes, or preservatives Pregnant or trying to get pregnant Breast-feeding How should I use this medication? This medication is infused into a vein. It is given in a hospital or clinic setting. A special MedGuide will be given to you before each treatment. Be sure to read this information carefully each time. Talk to your care team about the use of this medication in children. While it may be prescribed for children as young as 12 years for selected conditions, precautions do apply. Overdosage: If you think you have taken too much of this medicine contact a poison control center or emergency room at once. NOTE: This medicine is only for you. Do not share this medicine with others. What if I miss a dose? Keep appointments for follow-up doses. It is important not to miss your dose. Call your care team if you are unable to keep an appointment. What may interact with this medication? Interactions have not been studied. This list may not describe all possible interactions. Give your health care provider a list of all the medicines, herbs, non-prescription drugs, or dietary supplements you use. Also tell them if you  smoke, drink alcohol, or use illegal drugs. Some items may interact with your medicine. What should I watch for while using this medication? Your condition will be monitored carefully while you are receiving this medication. You may need blood work while taking this medication. This medication may cause serious skin reactions. They can happen weeks to months after starting the medication. Contact your care team right away if you notice fevers or flu-like symptoms with a rash. The rash may be red or purple and then turn into blisters or peeling of the skin. You may also notice a red rash with swelling of the face, lips, or lymph nodes in your neck or under your arms. Tell your care team right away if you have any change in your eyesight. Talk to your care team if you are pregnant or think you might be pregnant. A negative pregnancy test is required before starting this medication. A reliable form of contraception is recommended while taking this medication and for 5 months after the last dose. Talk to your care team about effective forms of contraception. Do not breast-feed while taking this medication and for 5 months after the last dose. What side effects may I notice from receiving this medication? Side effects that you should report to your care team as soon as possible: Allergic reactions--skin rash, itching, hives, swelling of the face, lips, tongue, or throat Dry cough, shortness of breath or trouble breathing Eye pain, redness, irritation, or discharge with blurry or decreased vision Heart muscle inflammation--unusual weakness or fatigue, shortness of breath, chest pain, fast or irregular heartbeat, dizziness, swelling of the ankles, feet, or hands Hormone  gland problems--headache, sensitivity to light, unusual weakness or fatigue, dizziness, fast or irregular heartbeat, increased sensitivity to cold or heat, excessive sweating, constipation, hair loss, increased thirst or amount of urine,  tremors or shaking, irritability Infusion reactions--chest pain, shortness of breath or trouble breathing, feeling faint or lightheaded Kidney injury (glomerulonephritis)--decrease in the amount of urine, red or dark brown urine, foamy or bubbly urine, swelling of the ankles, hands, or feet Liver injury--right upper belly pain, loss of appetite, nausea, light-colored stool, dark yellow or brown urine, yellowing skin or eyes, unusual weakness or fatigue Pain, tingling, or numbness in the hands or feet, muscle weakness, change in vision, confusion or trouble speaking, loss of balance or coordination, trouble walking, seizures Rash, fever, and swollen lymph nodes Redness, blistering, peeling, or loosening of the skin, including inside the mouth Sudden or severe stomach pain, bloody diarrhea, fever, nausea, vomiting Side effects that usually do not require medical attention (report these to your care team if they continue or are bothersome): Bone, joint, or muscle pain Diarrhea Fatigue Loss of appetite Nausea Skin rash This list may not describe all possible side effects. Call your doctor for medical advice about side effects. You may report side effects to FDA at 1-800-FDA-1088. Where should I keep my medication? This medication is given in a hospital or clinic. It will not be stored at home. NOTE: This sheet is a summary. It may not cover all possible information. If you have questions about this medicine, talk to your doctor, pharmacist, or health care provider.  2023 Elsevier/Gold Standard (2021-09-27 00:00:00) Influenza Virus Vaccine injection What is this medication? INFLUENZA VIRUS VACCINE (in floo EN zuh VAHY ruhs vak SEEN) helps to reduce the risk of getting influenza also known as the flu. The vaccine only helps protect you against some strains of the flu. This medicine may be used for other purposes; ask your health care provider or pharmacist if you have questions. COMMON BRAND  NAME(S): Afluria, Afluria Quadrivalent, Agriflu, Alfuria, FLUAD, FLUAD Quadrivalent, Fluarix, Fluarix Quadrivalent, Flublok, Flublok Quadrivalent, FLUCELVAX, FLUCELVAX Quadrivalent, Flulaval, Flulaval Quadrivalent, Fluvirin, Fluzone, Fluzone High-Dose, Fluzone Intradermal, Fluzone Quadrivalent What should I tell my care team before I take this medication? They need to know if you have any of these conditions: bleeding disorder like hemophilia fever or infection Guillain-Barre syndrome or other neurological problems immune system problems infection with the human immunodeficiency virus (HIV) or AIDS low blood platelet counts multiple sclerosis an unusual or allergic reaction to influenza virus vaccine, latex, other medicines, foods, dyes, or preservatives. Different brands of vaccines contain different allergens. Some may contain latex or eggs. Talk to your doctor about your allergies to make sure that you get the right vaccine. pregnant or trying to get pregnant breast-feeding How should I use this medication? This vaccine is for injection into a muscle or under the skin. It is given by a health care professional. A copy of Vaccine Information Statements will be given before each vaccination. Read this sheet carefully each time. The sheet may change frequently. Talk to your healthcare provider to see which vaccines are right for you. Some vaccines should not be used in all age groups. Overdosage: If you think you have taken too much of this medicine contact a poison control center or emergency room at once. NOTE: This medicine is only for you. Do not share this medicine with others. What if I miss a dose? This does not apply. What may interact with this medication? chemotherapy or radiation therapy medicines that  lower your immune system like etanercept, anakinra, infliximab, and adalimumab medicines that treat or prevent blood clots like warfarin phenytoin steroid medicines like prednisone  or cortisone theophylline vaccines This list may not describe all possible interactions. Give your health care provider a list of all the medicines, herbs, non-prescription drugs, or dietary supplements you use. Also tell them if you smoke, drink alcohol, or use illegal drugs. Some items may interact with your medicine. What should I watch for while using this medication? Report any side effects that do not go away within 3 days to your doctor or health care professional. Call your health care provider if any unusual symptoms occur within 6 weeks of receiving this vaccine. You may still catch the flu, but the illness is not usually as bad. You cannot get the flu from the vaccine. The vaccine will not protect against colds or other illnesses that may cause fever. The vaccine is needed every year. What side effects may I notice from receiving this medication? Side effects that you should report to your doctor or health care professional as soon as possible: allergic reactions like skin rash, itching or hives, swelling of the face, lips, or tongue Side effects that usually do not require medical attention (report to your doctor or health care professional if they continue or are bothersome): fever headache muscle aches and pains pain, tenderness, redness, or swelling at the injection site tiredness This list may not describe all possible side effects. Call your doctor for medical advice about side effects. You may report side effects to FDA at 1-800-FDA-1088. Where should I keep my medication? The vaccine will be given by a health care professional in a clinic, pharmacy, doctor's office, or other health care setting. You will not be given vaccine doses to store at home. NOTE: This sheet is a summary. It may not cover all possible information. If you have questions about this medicine, talk to your doctor, pharmacist, or health care provider.  2023 Elsevier/Gold Standard (2021-05-31 00:00:00)

## 2022-09-09 DIAGNOSIS — I10 Essential (primary) hypertension: Secondary | ICD-10-CM | POA: Diagnosis not present

## 2022-09-09 DIAGNOSIS — I5022 Chronic systolic (congestive) heart failure: Secondary | ICD-10-CM | POA: Diagnosis not present

## 2022-09-09 DIAGNOSIS — E1129 Type 2 diabetes mellitus with other diabetic kidney complication: Secondary | ICD-10-CM | POA: Diagnosis not present

## 2022-09-11 ENCOUNTER — Other Ambulatory Visit: Payer: Self-pay | Admitting: Oncology

## 2022-09-15 DIAGNOSIS — I5021 Acute systolic (congestive) heart failure: Secondary | ICD-10-CM | POA: Diagnosis not present

## 2022-09-22 ENCOUNTER — Encounter: Payer: Self-pay | Admitting: Oncology

## 2022-09-22 NOTE — Progress Notes (Signed)
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  He had a hypermetabolic right submandibular node on PET, but no evidence of distant metastasis.  His original lesion was diagnosed in March 2016.  He underwent excision, but refused aggressive therapy.  He has been receiving palliative nivolumab since November 2016, with several interruptions. His treatment was on hold from December 2022 to April 2023 due to cardiorespiratory issues. He continues to tolerate nivolumab without difficulty.  CT imaging in August 2023 remains without evidence of metastatic disease.  However, there were increasing bilateral pleural effusions and small volume abdominal pelvic ascites, with stable pericardial effusion, felt to be most likely due to fluid overload.  He is on Entresto.   B12 deficiency anemia He had missed several B12 injections, but these were resumed and he has continued them monthly.  His hemoglobin had improved with the B12 injections, but then he was found to have be iron deficient.  His B12 level last month was good.   Iron deficiency anemia He has had chronic anemia, but in July was found to have iron deficiency, so was treated with IV iron.  His hemoglobin improved somewhat since receiving the iron but now is back to 10.1.   Thrombocytopenia ([place]) He is new mild thrombocytopenia of uncertain etiology.  This may be related to his treatment.  This has nearly resolved now.    He will continue monthly infusions of nivolumab.  We will plan to see him back in 4 weeks with a CBC and comprehensive metabolic panel prior to his next treatment.  The patient understands the plans discussed today and is in agreement with them.  He  knows to contact our office if he develops concerns prior to his next appointment.     Kevin Kaplan, MD  [facility] [address] [place] [address] Dept: [phone] Dept Fax: [phone]   No orders of the defined types were placed in this encounter.     CHIEF COMPLAINT:  CC: Recurrent melanoma  Current Treatment: Palliative nivolumab every 4 weeks  HISTORY OF PRESENT ILLNESS:  Kevin [name] is an 81 year old with 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  His original lesion was diagnosed in March 2016.  He underwent excision, but refused aggressive surgery.  The lesion was at least 4 mm thick with a Clark's level 4, and a mitotic index 10/mm.  The margins were positive, but wide reexcision revealed no residual melanoma.  He had recurrence within 2 months and the lesion had been steadily growing.  He initially had refused seeing a medical oncologist, but finally came to [place] for evaluation and treatment in October 2016.  He does have extensive comorbidities including COPD, diabetes, hypertension, coronary artery disease, history of myocardial infarction, and pacemaker with AICD.  PET revealed hypermetabolism in the right submandibular node, but no evidence of distant metastasis.  He was having significant pain and requiring regular hydrocodone doses.  He also had severe disfigurement and avoided going out in public because of the facial lesion.  He has been receiving palliative nivolumab since November 2016 and has had a good response with a decrease in the right facial tumor.  He has  had associated hypomagnesemia and hypokalemia, so is on oral magnesium and potassium supplements. He has had mild chronic anemia, which has been stable. He was found to have B12 deficiency and continues B12 injections monthly.  CT head/neck in May 2017 revealed some nonspecific skin thickening in the right face in the area of his  melanoma in the right submandibular lymph node had decreased in size, now measuring 6 x 12 mm, but still prominent when compared to the left submandibular node.  He had 15 cycles of nivolumab and relocated to continue to get the same treatment.  He had his 30th dose in February 2018 and then returned to the [place] area.     He was admitted to the hospital in early March 2018 for congestive heart failure associated with pleural and pericardial effusions.  He had no leukocytosis, fever or purulent sputum.  The echocardiogram revealed severe diffuse hypokinesis with mild concentric hypertrophy and markedly depressed ejection fraction of 20 to 25%, associated with moderate mitral regurgitation and moderate left atrial dilatation.  He improved with diuresis.  CT chest in March 2018 revealed stable cardiomegaly and stable small pericardial effusion, but no evidence of immune mediated pneumonitis or metastatic disease.  The previously seen bilateral pleural effusions had resolved.  He returned to our care and nivolumab every 2 weeks was resumed in April.  CT chest was repeated in early May after 1 month back on therapy.  This was stable, without evidence of recurrent pleural effusions, immunotherapy toxicities, or evidence of metastatic disease.  He was switched to every 28-day nivolumab in August 2018.  He has had stable disease so has continued on nivolumab.  CT imaging in October 2018 revealed a stable 5 mm level 1B cervical lymph node, as well as a stable 8 mm exophytic lesion of the right face.  CT chest did not reveal any evidence of metastatic disease. Borderline cardiomegaly and a small amount of pericardial fluid/thickening was seen, but not reaccumulation of the pleural effusions.  CT head did not reveal any evidence of intracranial metastasis.  There were chronic ischemic changes and evidence of old infarcts.   Unfortunately, his wife passed away in 06-14-2018 and she was his primary caregiver His  daughters have been caring for him since then.  CT scans have remained stable, so he has continued nivolumab every 4 weeks.  He was given mirtazapine for depression and weight loss the dose was increased to 15 mg.  He was then lost to follow-up from December 2021 to April 2022, at which time he resumed nivolumab and B12 every 4 weeks.   CT head, neck and chest in June 2022 did not reveal any evidence of progressive disease.  He was admitted at [facility] in November/December 2022 due to flu and pneumonia.  Nivolumab was placed on hold in December.  CT chest, abdomen and pelvis in February did not reveal any evidence of metastatic disease.  There was new extensive tree in bud nodularity in the right lung with most significant involvement of the right lower lobe. Findings were most consistent with an infectious or inflammatory process in a pattern suggestive of atypical infection including nontuberculous mycobacterium.  He was seen by Dr. [name], who recommended bronchoscopy, but the patient did not return for follow-up as he did not wish to pursue this procedure.  Nivolumab was resumed again in April.  He was last CT scans in August did not reveal obvious metastatic disease.  There was an increase in his bilateral  pleural effusions, stable pericardial effusion and mild ascites, felt to most likely be due to fluid overload.  He is on Entresto and follows with cardiology.   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 ([place])  01/24/2015 Cancer Staging   Staging form: Melanoma of the Skin, AJCC 8th Edition - Clinical stage from 01/24/2015: Stage IIB (cT3b, cN0, cM0) - Signed by Kevin Kaplan, MD on 08/19/2021 Histopathologic type: Malignant melanoma, NOS (except juvenile melanoma [id number]) Stage prefix: Initial diagnosis Laterality: Right Lymph-vascular invasion (LVI): LVI not present (absent)/not identified Diagnostic confirmation: Positive histology Specimen type: Excision Staged by:  Managing physician Mitotic count: 10 Mitotic unit: mm2 Clark's level: Level IV Tumor-infiltrating lymphocytes: Unknown Neurotropism: Absent Presence of extranodal extension: Absent Breslow depth (mm): 40 Ulceration of the epidermis: Yes Microsatellites: No Primary tumor regression: Unknown Sentinel lymph node biopsy performed: No Matted nodes: No Prognostic indicators: Wide excision negative Stage used in treatment planning: Yes National guidelines used in treatment planning: Yes Type of national guideline used in treatment planning: NCCN   07/18/2020 - 06/18/2022 Chemotherapy   Patient is on Treatment Plan : MELANOMA Nivolumab + B12 q28d     07/18/2020 -  Chemotherapy   Patient is on Treatment Plan : MELANOMA Nivolumab (480) q28d     07/29/2020 Initial Diagnosis   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 ([place])       INTERVAL HISTORY:  Ballard is here today for repeat clinical assessment prior to nivolumab.  He denies any new symptoms.  He reports stable shortness of breath.  He denies cough or chest pain.  He states he does uses oxygen at night as needed.  He reports this persistent lower extremity edema, which improves overnight.  The cardiologist has been working on diuresis and his BUN is 24 with a creatinine of 1.6.  The rest of his CMP is unremarkable other than a nonfasting blood sugar of 185.  His hemoglobin has dropped from 10.3 to 10.1 but his platelet count came up to 130,000 from 113,000.  He has intermittent diarrhea not requiring medication.  He denies itching or skin rash.  He denies abnormal bleeding.  He denies melena or hematochezia.  He denies fevers or chills. He denies pain. His appetite is good. His weight has decreased 2 pounds over last 6 weeks .  He ambulates with a cane.  He states he is up during the day and walks outside as much as he can.  REVIEW OF SYSTEMS:  Review of Systems  Constitutional: Negative.  Negative for appetite change, chills, fever and unexpected  weight change.  HENT:  Negative.  Negative for lump/mass, mouth sores and sore throat.   Eyes: Negative.   Respiratory:  Positive for shortness of breath (Stable). Negative for cough.   Cardiovascular:  Negative for chest pain and leg swelling.  Gastrointestinal: Negative.  Negative for abdominal pain, constipation, diarrhea (Intermittent, not severe), nausea and vomiting.  Endocrine: Negative.   Genitourinary: Negative.  Negative for difficulty urinating, dysuria, frequency and hematuria.   Musculoskeletal: Negative.  Negative for arthralgias, back pain and myalgias.  Skin: Negative.  Negative for itching, rash and wound.  Neurological: Negative.  Negative for dizziness, extremity weakness, headaches, light-headedness and numbness.  Hematological:  Negative for adenopathy. Bruises/bleeds easily.  Psychiatric/Behavioral:  Positive for confusion. Negative for depression and sleep disturbance. The patient is not nervous/anxious.      VITALS:  Blood pressure 138/63, pulse 89, temperature (!) 97.5 F (36.4 C), temperature source Oral,  resp. rate 18, height '5\' 7"'$  (1.702 m), weight 168 lb 9.6 oz (76.5 kg), SpO2 97 %.  Wt Readings from Last 3 Encounters:  08/29/22 168 lb (76.2 kg)  08/27/22 168 lb 9.6 oz (76.5 kg)  08/14/22 170 lb (77.1 kg)    Body mass index is 26.41 kg/m.  Performance status (ECOG): 1 - Symptomatic but completely ambulatory  PHYSICAL EXAM:  Physical Exam Vitals and nursing note reviewed.  Constitutional:      General: He is not in acute distress.    Appearance: Normal appearance. He is normal weight.  HENT:     Head: Normocephalic and atraumatic.     Mouth/Throat:     Mouth: Mucous membranes are moist.     Pharynx: Oropharynx is clear. No oropharyngeal exudate or posterior oropharyngeal erythema.  Eyes:     General: No scleral icterus.    Extraocular Movements: Extraocular movements intact.     Conjunctiva/sclera: Conjunctivae normal.     Pupils: Pupils are  equal, round, and reactive to light.  Cardiovascular:     Rate and Rhythm: Normal rate and regular rhythm.     Heart sounds: Normal heart sounds. No murmur heard.    No friction rub. No gallop.  Pulmonary:     Effort: Pulmonary effort is normal.     Breath sounds: Examination of the right-lower field reveals decreased breath sounds and rales. Examination of the left-lower field reveals decreased breath sounds and rales. Decreased breath sounds and rales present. No wheezing or rhonchi.  Abdominal:     General: Bowel sounds are normal. There is no distension.     Palpations: Abdomen is soft. There is no fluid wave, hepatomegaly, splenomegaly or mass.     Tenderness: There is no abdominal tenderness.     Comments: Mild edema of the lower abdomen  Musculoskeletal:        General: Normal range of motion.     Cervical back: Normal range of motion and neck supple. No tenderness.     Right lower leg: Edema (2+ to the knees) present.     Left lower leg: Edema (2+ to the knees) present.  Lymphadenopathy:     Cervical: No cervical adenopathy.  Skin:    General: Skin is warm and dry.     Coloration: Skin is not jaundiced.     Findings: No rash.  Neurological:     Mental Status: He is alert and oriented to person, place, and time.     Cranial Nerves: No cranial nerve deficit.  Psychiatric:        Mood and Affect: Mood normal.        Behavior: Behavior normal.        Thought Content: Thought content normal.    LABS:      Latest Ref Rng & Units 08/27/2022   12:00 AM 07/29/2022   12:00 AM 06/16/2022   12:00 AM  CBC  WBC  4.8     4.7     4.9      Hemoglobin 13.5 - 17.5 10.1     10.3     10.2      Hematocrit 41 - 53 31     32     31      Platelets 150 - 400 K/uL 130     113     104         This result is from an external source.       Latest Ref Rng &  Units 08/27/2022   12:00 AM 07/29/2022   12:00 AM 06/16/2022   12:00 AM  CMP  BUN 4 - '21 24     31     30      '$ Creatinine 0.6 - 1.3  1.6     1.4     1.6      Sodium 137 - 147 140     140     142      Potassium 3.5 - 5.1 mEq/L 4.3     3.9     4.0      Chloride 99 - 108 107     107     108      CO2 13 - '22 30     24     28      '$ Calcium 8.7 - 10.7 9.0     9.1     8.7      Alkaline Phos 25 - 125 114     93     82      AST 14 - 40 28     46     25      ALT 10 - 40 U/L 33     46     35         This result is from an external source.      No results found for: "CEA1", "CEA" / No results found for: "CEA1", "CEA" No results found for: "PSA1" No results found for: "JME268" No results found for: "CAN125"  No results found for: "TOTALPROTELP", "ALBUMINELP", "A1GS", "A2GS", "BETS", "BETA2SER", "GAMS", "MSPIKE", "SPEI" Lab Results  Component Value Date   TIBC 356 05/21/2022   FERRITIN 46 05/21/2022   IRONPCTSAT 10 (L) 05/21/2022   No results found for: "LDH"  STUDIES:  No results found.    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)    Thrombocytopenia ([place]) 07/29/2022    Past Surgical History:  Procedure Laterality Date   APPENDECTOMY     CARDIAC CATHETERIZATION     CORONARY ANGIOPLASTY     PACEMAKER INSERTION     RIGHT/LEFT HEART CATH AND CORONARY ANGIOGRAPHY N/A 10/09/2021   Procedure: RIGHT/LEFT HEART CATH AND CORONARY ANGIOGRAPHY;  Surgeon: Nigel Mormon, MD;  Location: [facility];  Service: Cardiovascular;  Laterality: N/A;    Family History  Problem Relation Age of Onset   Breast cancer Mother    Diabetes Mellitus II Father     Ovarian cancer Sister    Cancer Brother    Cancer Brother    Cancer Maternal Uncle    Breast cancer Niece     Social History:  reports that he has been smoking cigarettes. He has a 30.00 pack-year smoking history. He has never used smokeless tobacco. He reports that he does not currently use alcohol. He reports that he does not currently use drugs.The patient is alone today.  Allergies: No Known Allergies  Current Medications: Current  Outpatient Medications  Medication Sig Dispense Refill   albuterol (PROVENTIL) (2.5 MG/3ML) 0.083% nebulizer solution Take by nebulization.     albuterol (VENTOLIN HFA) 108 (90 Base) MCG/ACT inhaler Inhale into the lungs.     ALBUTEROL SULFATE PO Take by mouth.     amiodarone (PACERONE) 200 MG tablet Take 1 tablet (200 mg total) by mouth daily. 90 tablet 3   apixaban (ELIQUIS) 5 MG TABS tablet Take 1 tablet (5 mg total) by mouth 2 (two) times daily. 180 tablet 3   atorvastatin (LIPITOR) 80 MG tablet Take 1 tablet (80 mg total) by mouth at bedtime. 90 tablet 3   Fe Fum-FA-B Cmp-C-Zn-Mg-Mn-Cu (HEMOCYTE PLUS) 106-1 MG CAPS Take 1 capsule by mouth daily.     furosemide (LASIX) 40 MG tablet Take 1 tablet (40 mg total) by mouth every other day. 90 tablet 1   magnesium oxide (MAG-OX) 400 (240 Mg) MG tablet TAKE 1 TABLET BY MOUTH TWICE A DAY 180 tablet 1   metFORMIN (GLUCOPHAGE) 1000 MG tablet Take 500 mg by mouth 2 (two) times daily.     metoprolol succinate (TOPROL-XL) 25 MG 24 hr tablet Take 0.5 tablets (12.5 mg total) by mouth daily. 45 tablet 3   ondansetron (ZOFRAN-ODT) 4 MG disintegrating tablet Take 1 tablet by mouth daily as needed.     potassium chloride SA (KLOR-CON M) 20 MEQ tablet Take 1 tablet (20 mEq total) by mouth daily. 90 tablet 3   sacubitril-valsartan (ENTRESTO) 24-26 MG Take 0.5 tablets by mouth 2 (two) times daily. 90 tablet 3   SYMBICORT 80-4.5 MCG/ACT inhaler Inhale into the lungs.     tamsulosin (FLOMAX) 0.4 MG CAPS capsule Take by mouth.  (Patient not taking: Reported on 08/14/2022)     No current facility-administered medications for this visit.

## 2022-09-23 ENCOUNTER — Inpatient Hospital Stay: Payer: Medicare PPO | Attending: Oncology | Admitting: Hematology and Oncology

## 2022-09-23 ENCOUNTER — Inpatient Hospital Stay: Payer: Medicare PPO

## 2022-09-23 ENCOUNTER — Encounter: Payer: Self-pay | Admitting: Hematology and Oncology

## 2022-09-23 VITALS — BP 131/71 | HR 68 | Temp 97.6°F | Resp 18 | Ht 67.0 in | Wt 168.4 lb

## 2022-09-23 DIAGNOSIS — Z5112 Encounter for antineoplastic immunotherapy: Secondary | ICD-10-CM | POA: Diagnosis present

## 2022-09-23 DIAGNOSIS — C4339 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: Secondary | ICD-10-CM

## 2022-09-23 DIAGNOSIS — D519 Vitamin B12 deficiency anemia, unspecified: Secondary | ICD-10-CM

## 2022-09-23 DIAGNOSIS — Z79899 Other long term (current) drug therapy: Secondary | ICD-10-CM | POA: Insufficient documentation

## 2022-09-23 DIAGNOSIS — D509 Iron deficiency anemia, unspecified: Secondary | ICD-10-CM | POA: Diagnosis not present

## 2022-09-23 LAB — CBC WITH DIFFERENTIAL (CANCER CENTER ONLY)
Abs Immature Granulocytes: 0.01 10*3/uL (ref 0.00–0.07)
Basophils Absolute: 0.1 10*3/uL (ref 0.0–0.1)
Basophils Relative: 1 %
Eosinophils Absolute: 0.1 10*3/uL (ref 0.0–0.5)
Eosinophils Relative: 2 %
HCT: 33.1 % — ABNORMAL LOW (ref 39.0–52.0)
Hemoglobin: 10.2 g/dL — ABNORMAL LOW (ref 13.0–17.0)
Immature Granulocytes: 0 %
Lymphocytes Relative: 9 %
Lymphs Abs: 0.4 10*3/uL — ABNORMAL LOW (ref 0.7–4.0)
MCH: 31.7 pg (ref 26.0–34.0)
MCHC: 30.8 g/dL (ref 30.0–36.0)
MCV: 102.8 fL — ABNORMAL HIGH (ref 80.0–100.0)
Monocytes Absolute: 0.4 10*3/uL (ref 0.1–1.0)
Monocytes Relative: 9 %
Neutro Abs: 3.9 10*3/uL (ref 1.7–7.7)
Neutrophils Relative %: 79 %
Platelet Count: 138 10*3/uL — ABNORMAL LOW (ref 150–400)
RBC: 3.22 MIL/uL — ABNORMAL LOW (ref 4.22–5.81)
RDW: 16.5 % — ABNORMAL HIGH (ref 11.5–15.5)
WBC Count: 4.9 10*3/uL (ref 4.0–10.5)
nRBC: 0 % (ref 0.0–0.2)

## 2022-09-23 LAB — CMP (CANCER CENTER ONLY)
ALT: 19 U/L (ref 0–44)
AST: 18 U/L (ref 15–41)
Albumin: 4 g/dL (ref 3.5–5.0)
Alkaline Phosphatase: 85 U/L (ref 38–126)
Anion gap: 7 (ref 5–15)
BUN: 37 mg/dL — ABNORMAL HIGH (ref 8–23)
CO2: 27 mmol/L (ref 22–32)
Calcium: 9.2 mg/dL (ref 8.9–10.3)
Chloride: 110 mmol/L (ref 98–111)
Creatinine: 1.47 mg/dL — ABNORMAL HIGH (ref 0.61–1.24)
GFR, Estimated: 48 mL/min — ABNORMAL LOW (ref >60)
Glucose, Bld: 113 mg/dL — ABNORMAL HIGH (ref 70–99)
Potassium: 4.1 mmol/L (ref 3.5–5.1)
Sodium: 144 mmol/L (ref 135–145)
Total Bilirubin: 0.7 mg/dL (ref 0.3–1.2)
Total Protein: 6.9 g/dL (ref 6.5–8.1)

## 2022-09-23 LAB — TSH: TSH: 4.432 u[IU]/mL (ref 0.350–4.500)

## 2022-09-23 NOTE — Progress Notes (Signed)
[name]  [address] 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  He had a hypermetabolic right submandibular node on PET, but no evidence of distant metastasis.  His original lesion was diagnosed in March 2016.  He underwent excision, but refused aggressive therapy.  He has been receiving palliative nivolumab since November 2016, with several interruptions. His treatment was on hold from December 2022 to April 2023 due to cardiorespiratory issues. He continues to tolerate nivolumab without difficulty.  CT imaging in August 2023 remains without evidence of metastatic disease.  However, there were increasing bilateral pleural effusions and small volume abdominal pelvic ascites, with stable pericardial effusion, felt to be most likely due to fluid overload.  He is on Entresto.  He continues to tolerate nivolumab without significant difficulty.  He has been having increased diarrhea for about a week, but is afraid to take an antidiarrheal due to previous episode of obstipation.  If his diarrhea worsens, he will contact us.  We will proceed with that this week.  We will plan to see him back in 4 weeks for repeat clinical assessment prior to his next nivolumab.    B12 deficiency anemia He continues B12 injections monthly.  Iron deficiency anemia He last received IV iron in July.  He has persistent anemia, so will repeat iron studies today.  Most likely, this represents anemia of chronic disease.   The patient understands the plans discussed today and is in agreement with them.  He knows to contact our office if he develops concerns prior to his next appointment.     [name], PA-C  [facility] [address] [place] [address] Dept: [phone] Dept Fax: [phone]   No orders of the defined types were placed in this encounter.     CHIEF COMPLAINT:  CC: Recurrent melanoma  Current Treatment: Maintenance nivolumab every 4 weeks  HISTORY OF PRESENT ILLNESS:  Kevin [name] is an 81 year old with 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  His original lesion was diagnosed in March 2016.  He underwent excision, but refused aggressive surgery.  The lesion was at least 4 mm thick with a Clark's level 4, and a mitotic index 10/mm.  The margins were positive, but wide reexcision revealed no residual melanoma.  He had recurrence within 2 months and the lesion was steadily growing.  He initially refused seeing a medical oncologist, but finally came to [place] for evaluation and treatment in October 2016.  PET revealed hypermetabolism in the right submandibular node, but no evidence of distant metastasis. He has been receiving palliative nivolumab since November 2016 and has had a good response with a decrease in the right facial tumor.  He has had associated hypomagnesemia and hypokalemia, so has continued on oral magnesium and potassium supplements.    CT head/neck in May 2017 revealed some nonspecific skin thickening in the right face in the area of his melanoma in the right submandibular lymph node had decreased in size, now measuring 6 x 12 mm, but still prominent when compared to the left submandibular node.  He had 15 cycles of nivolumab and relocated to continue to get the same treatment.  He had his 30th dose  in February 2018 and then returned to the [place] area.     He returned to our care and nivolumab every 2 weeks was resumed in April 2018.  He had hospitalized in March 2018 with congestive heart failure.  Echocardiogram revealed severe diffuse hypokinesis and mild concentric hypertrophy with a ejection fraction of 20 to 25%.  CT chest was  repeated in early May after 1 month back on therapy.  This was stable, without evidence of recurrent pleural effusions, immunotherapy toxicities, or evidence of metastatic disease.  He was switched to every 28-day nivolumab in August 2018. CT imaging in October 2018 revealed a stable 5 mm level 1B cervical lymph node, as well as a stable 8 mm exophytic lesion of the right face.   CT scans have remained stable, so he has continued nivolumab every 4 weeks.  He was given mirtazapine for depression and weight loss the dose was increased to 15 mg.  He was then lost to follow-up from December 2021 to April 2022, at which time he resumed nivolumab and B12 every 4 weeks.   CT head, neck and chest in June 2022 did not reveal any evidence of progressive disease.  He was admitted in November 2022, so nivolumab was placed on hold.  CT imaging in February 2023 did not reveal any evidence of metastatic disease.  There were changes in the lung concerning for atypical infection and Dr. Andria Frames recommended bronchoscopy, but the patient did not go for that.  CT scans in August did not reveal obvious metastatic disease.  There was an increase in his bilateral pleural effusions, stable pericardial effusion and mild ascites, felt to most likely be due to fluid overload.  He is on Entresto and follows with cardiology.   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 ([place])  01/24/2015 Cancer Staging   Staging form: Melanoma of the Skin, AJCC 8th Edition - Clinical stage from 01/24/2015: Stage IIB (cT3b, cN0, cM0) - Signed by [name], MD on 08/19/2021 Histopathologic type: Malignant melanoma, NOS (except juvenile melanoma [id number]) Stage prefix: Initial diagnosis Laterality: Right Lymph-vascular invasion (LVI): LVI not present (absent)/not identified Diagnostic confirmation: Positive histology Specimen type: Excision Staged by: Managing physician Mitotic count: 10 Mitotic unit: mm2 Clark's level:  Level IV Tumor-infiltrating lymphocytes: Unknown Neurotropism: Absent Presence of extranodal extension: Absent Breslow depth (mm): 40 Ulceration of the epidermis: Yes Microsatellites: No Primary tumor regression: Unknown Sentinel lymph node biopsy performed: No Matted nodes: No Prognostic indicators: Wide excision negative Stage used in treatment planning: Yes National guidelines used in treatment planning: Yes Type of national guideline used in treatment planning: NCCN   07/18/2020 - 06/18/2022 Chemotherapy   Patient is on Treatment Plan : MELANOMA Nivolumab + B12 q28d     07/18/2020 -  Chemotherapy   Patient is on Treatment Plan : MELANOMA Nivolumab (480) q28d     07/29/2020 Initial Diagnosis   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 ([place])       INTERVAL HISTORY:  Lamberto is here today for repeat clinical assessment prior to nivolumab.  He states he has been having diarrhea 3-4 times a day for about a week.  He does not like to use medications as he has previously been obstipated.  He denies abdominal pain. He denies fevers or chills.  His appetite is good. His weight has been stable.  He still has lower extremity edema and states his cardiologist has him weighing himself daily.  He continues to use a  cane to ambulate.  REVIEW OF SYSTEMS:  Review of Systems  Constitutional:  Negative for appetite change, chills, fatigue, fever and unexpected weight change.  HENT:   Negative for lump/mass, mouth sores and sore throat.   Respiratory:  Negative for cough and shortness of breath.   Cardiovascular:  Negative for chest pain and leg swelling.  Gastrointestinal:  Positive for diarrhea. Negative for abdominal pain, constipation, nausea and vomiting.  Genitourinary:  Negative for difficulty urinating, dysuria, frequency and hematuria.   Musculoskeletal:  Positive for gait problem. Negative for arthralgias, back pain and myalgias.  Skin:  Negative for itching, rash and wound.  Neurological:   Positive for gait problem. Negative for dizziness, extremity weakness, headaches, light-headedness and numbness.  Hematological:  Negative for adenopathy.  Psychiatric/Behavioral:  Negative for depression and sleep disturbance. The patient is not nervous/anxious.      VITALS:  Blood pressure 131/71, pulse 68, temperature 97.6 F (36.4 C), temperature source Oral, resp. rate 18, height '5\' 7"'$  (1.702 m), weight 168 lb 6.4 oz (76.4 kg), SpO2 98 %.  Wt Readings from Last 3 Encounters:  09/23/22 168 lb 6.4 oz (76.4 kg)  08/29/22 168 lb (76.2 kg)  08/27/22 168 lb 9.6 oz (76.5 kg)    Body mass index is 26.38 kg/m.  Performance status (ECOG): 1 - Symptomatic but completely ambulatory  PHYSICAL EXAM:  Physical Exam Vitals and nursing note reviewed.  Constitutional:      General: He is not in acute distress.    Appearance: Normal appearance. He is normal weight.  HENT:     Head: Normocephalic and atraumatic.     Mouth/Throat:     Mouth: Mucous membranes are moist.     Pharynx: Oropharynx is clear. No oropharyngeal exudate or posterior oropharyngeal erythema.  Eyes:     General: No scleral icterus.    Extraocular Movements: Extraocular movements intact.     Conjunctiva/sclera: Conjunctivae normal.     Pupils: Pupils are equal, round, and reactive to light.  Cardiovascular:     Rate and Rhythm: Normal rate and regular rhythm.     Heart sounds: Normal heart sounds. No murmur heard.    No friction rub. No gallop.  Pulmonary:     Effort: Pulmonary effort is normal.     Breath sounds: Normal breath sounds. No wheezing, rhonchi or rales.  Abdominal:     General: Bowel sounds are normal. There is no distension.     Palpations: Abdomen is soft. There is no hepatomegaly, splenomegaly or mass.     Tenderness: There is no abdominal tenderness.  Musculoskeletal:        General: Normal range of motion.     Cervical back: Normal range of motion and neck supple. No tenderness.     Right lower  leg: No edema.     Left lower leg: No edema.  Lymphadenopathy:     Cervical: No cervical adenopathy.     Upper Body:     Right upper body: No supraclavicular or axillary adenopathy.     Left upper body: No supraclavicular or axillary adenopathy.     Lower Body: No right inguinal adenopathy. No left inguinal adenopathy.  Skin:    General: Skin is warm and dry.     Coloration: Skin is not jaundiced.     Findings: No rash.  Neurological:     Mental Status: He is alert and oriented to person, place, and time.     Cranial Nerves: No cranial nerve deficit.  Psychiatric:        Mood and Affect: Mood normal.        Behavior: Behavior normal.        Thought Content: Thought content normal.     LABS:      Latest Ref Rng & Units 09/23/2022    2:15 PM 08/27/2022   12:00 AM 07/29/2022   12:00 AM  CBC  WBC 4.0 - 10.5 K/uL 4.9  4.8     4.7      Hemoglobin 13.0 - 17.0 g/dL 10.2  10.1     10.3      Hematocrit 39.0 - 52.0 % 33.1  31     32      Platelets 150 - 400 K/uL 138  130     113         This result is from an external source.      Latest Ref Rng & Units 09/23/2022    2:15 PM 08/27/2022   12:00 AM 07/29/2022   12:00 AM  CMP  Glucose 70 - 99 mg/dL 113     BUN 8 - 23 mg/dL 37  24     31      Creatinine 0.61 - 1.24 mg/dL 1.47  1.6     1.4      Sodium 135 - 145 mmol/L 144  140     140      Potassium 3.5 - 5.1 mmol/L 4.1  4.3     3.9      Chloride 98 - 111 mmol/L 110  107     107      CO2 22 - 32 mmol/L '27  30     24      '$ Calcium 8.9 - 10.3 mg/dL 9.2  9.0     9.1      Total Protein 6.5 - 8.1 g/dL 6.9     Total Bilirubin 0.3 - 1.2 mg/dL 0.7     Alkaline Phos 38 - 126 U/L 85  114     93      AST 15 - 41 U/L 18  28     46      ALT 0 - 44 U/L 19  33     46         This result is from an external source.     No results found for: "CEA1", "CEA" / No results found for: "CEA1", "CEA" No results found for: "PSA1" No results found for: "IRW431" No results found for: "CAN125"  No  results found for: "TOTALPROTELP", "ALBUMINELP", "A1GS", "A2GS", "BETS", "BETA2SER", "GAMS", "MSPIKE", "SPEI" Lab Results  Component Value Date   TIBC 356 05/21/2022   FERRITIN 46 05/21/2022   IRONPCTSAT 10 (L) 05/21/2022   No results found for: "LDH"  STUDIES:  No results found.    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) ([place])    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) ([place])    Thrombocytopenia ([place]) 07/29/2022    Past Surgical History:  Procedure Laterality Date  APPENDECTOMY     CARDIAC CATHETERIZATION     CORONARY ANGIOPLASTY     PACEMAKER INSERTION     RIGHT/LEFT HEART CATH AND CORONARY ANGIOGRAPHY N/A 10/09/2021   Procedure: RIGHT/LEFT HEART CATH AND CORONARY ANGIOGRAPHY;  Surgeon: Nigel Mormon, MD;  Location: [facility];  Service: Cardiovascular;  Laterality: N/A;    Family History  Problem Relation Age of Onset   Breast cancer Mother    Diabetes Mellitus II Father    Ovarian cancer Sister    Cancer Brother    Cancer Brother    Cancer Maternal Uncle    Breast cancer Niece     Social History:  reports that he has been smoking cigarettes. He has a 30.00 pack-year smoking history. He has never used smokeless tobacco. He reports that he does not currently use alcohol. He reports that he does not currently use drugs.The patient is alone today.  Allergies: No Known Allergies  Current Medications: Current Outpatient Medications  Medication Sig Dispense Refill   albuterol (PROVENTIL) (2.5 MG/3ML) 0.083% nebulizer solution Take by nebulization.      albuterol (VENTOLIN HFA) 108 (90 Base) MCG/ACT inhaler Inhale into the lungs.     ALBUTEROL SULFATE PO Take by mouth.     amiodarone (PACERONE) 200 MG tablet Take 1 tablet (200 mg total) by mouth daily. 90 tablet 3   apixaban (ELIQUIS) 5 MG TABS tablet Take 1 tablet (5 mg total) by mouth 2 (two) times daily. 180 tablet 3   atorvastatin (LIPITOR) 80 MG tablet Take 1 tablet (80 mg total) by mouth at bedtime. 90 tablet 3   Fe Fum-FA-B Cmp-C-Zn-Mg-Mn-Cu (HEMOCYTE PLUS) 106-1 MG CAPS Take 1 capsule by mouth daily.     furosemide (LASIX) 40 MG tablet Take 1 tablet (40 mg total) by mouth every other day. 90 tablet 1   magnesium oxide (MAG-OX) 400 (240 Mg) MG tablet TAKE 1 TABLET BY MOUTH TWICE A DAY 180 tablet 1   metFORMIN (GLUCOPHAGE) 1000 MG tablet Take 500 mg by mouth 2 (two) times daily.     metoprolol succinate (TOPROL-XL) 25 MG 24 hr tablet Take 0.5 tablets (12.5 mg total) by mouth daily. 45 tablet 3   ondansetron (ZOFRAN-ODT) 4 MG disintegrating tablet Take 1 tablet by mouth daily as needed.     potassium chloride SA (KLOR-CON M) 20 MEQ tablet Take 1 tablet (20 mEq total) by mouth daily. 90 tablet 3   sacubitril-valsartan (ENTRESTO) 24-26 MG Take 0.5 tablets by mouth 2 (two) times daily. 90 tablet 3   SYMBICORT 80-4.5 MCG/ACT inhaler Inhale into the lungs.     tamsulosin (FLOMAX) 0.4 MG CAPS capsule Take by mouth. (Patient not taking: Reported on 08/14/2022)     No current facility-administered medications for this visit.

## 2022-09-23 NOTE — Assessment & Plan Note (Addendum)
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  He had a hypermetabolic right submandibular node on PET, but no evidence of distant metastasis.  His original lesion was diagnosed in March 2016.  He underwent excision, but refused aggressive therapy.  He has been receiving palliative nivolumab since November 2016, with several interruptions. His treatment was on hold from December 2022 to April 2023 due to cardiorespiratory issues. He continues to tolerate nivolumab without difficulty.  CT imaging in August 2023 remains without evidence of metastatic disease.  However, there were increasing bilateral pleural effusions and small volume abdominal pelvic ascites, with stable pericardial effusion, felt to be most likely due to fluid overload.  He is on Entresto.  He continues to tolerate nivolumab without significant difficulty.  He has been having increased diarrhea for about a week, but is afraid to take an antidiarrheal due to previous episode of obstipation.  If his diarrhea worsens, he will contact us.  We will proceed with that this week.  We will plan to see him back in 4 weeks for repeat clinical assessment prior to his next nivolumab.

## 2022-09-23 NOTE — Assessment & Plan Note (Signed)
He continues B12 injections monthly. 

## 2022-09-23 NOTE — Progress Notes (Signed)
Spoke with patient about the [employer], he will bring in his financial information at next appointment.

## 2022-09-23 NOTE — Progress Notes (Deleted)
Expand All Collapse All  [place]  [address] 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  He had a hypermetabolic right submandibular node on PET, but no evidence of distant metastasis.  His original lesion was diagnosed in March 2016.  He underwent excision, but refused aggressive therapy.  He has been receiving palliative nivolumab since November 2016, with several interruptions. His treatment was on hold from December 2022 to April 2023 due to cardiorespiratory issues. He continues to tolerate nivolumab without difficulty.  CT imaging in August 2023 remains without evidence of metastatic disease.  However, there were increasing bilateral pleural effusions and small volume abdominal pelvic ascites, with stable pericardial effusion, felt to be most likely due to fluid overload.  He is on Entresto.   B12 deficiency anemia He had missed several B12 injections, but these were resumed and he has continued them monthly.  His hemoglobin had improved with the B12 injections, but then he was found to have be iron deficient.  His B12 level last month was good.   Iron deficiency anemia He has had chronic anemia, but in July was found to have iron deficiency, so was treated with IV iron.  His hemoglobin improved somewhat since receiving the iron but now is back to 10.1.   Thrombocytopenia ([place]) He is new mild thrombocytopenia of uncertain etiology.  This may be related to his treatment.  This has nearly resolved now.     He will continue monthly infusions of nivolumab.  We will plan to see him back in 4 weeks with a CBC and comprehensive metabolic panel prior to his next treatment.  The patient understands the plans discussed today and  is in agreement with them.  He knows to contact our office if he develops concerns prior to his next appointment.         [name], MD  [facility] [address] [place] [address] Dept: [phone] Dept Fax: [phone]    No orders of the defined types were placed in this encounter.       CHIEF COMPLAINT:  CC: Recurrent melanoma   Current Treatment: Palliative nivolumab every 4 weeks   HISTORY OF PRESENT ILLNESS:  Kevin [name] is an 81 year old with 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  His original lesion was diagnosed in March 2016.  He underwent excision, but refused aggressive surgery.  The lesion was at least 4 mm thick with a Clark's level 4, and a mitotic index 10/mm.  The margins were positive, but wide reexcision revealed no residual melanoma.  He had recurrence within 2 months and the lesion had been steadily growing.  He initially had refused seeing a medical oncologist, but finally came to [place] for evaluation and treatment in October 2016.  He does have extensive comorbidities including COPD, diabetes, hypertension, coronary artery disease, history of myocardial infarction, and pacemaker with AICD.  PET revealed hypermetabolism in the right submandibular node, but no evidence of distant metastasis.  He was having significant pain and requiring regular hydrocodone doses.  He also had severe disfigurement and avoided going out in public because of the facial lesion.  He has been receiving palliative nivolumab  since November 2016 and has had a good response with a decrease in the right facial tumor.  He has had associated hypomagnesemia and hypokalemia, so is on oral magnesium and potassium supplements. He has had mild chronic anemia, which has been stable. He was found to have B12 deficiency and continues B12 injections monthly.  CT head/neck in May 2017 revealed some nonspecific skin  thickening in the right face in the area of his melanoma in the right submandibular lymph node had decreased in size, now measuring 6 x 12 mm, but still prominent when compared to the left submandibular node.  He had 15 cycles of nivolumab and relocated to continue to get the same treatment.  He had his 30th dose in February 2018 and then returned to the [place] area.     He was admitted to the hospital in early March 2018 for congestive heart failure associated with pleural and pericardial effusions.  He had no leukocytosis, fever or purulent sputum.  The echocardiogram revealed severe diffuse hypokinesis with mild concentric hypertrophy and markedly depressed ejection fraction of 20 to 25%, associated with moderate mitral regurgitation and moderate left atrial dilatation.  He improved with diuresis.  CT chest in March 2018 revealed stable cardiomegaly and stable small pericardial effusion, but no evidence of immune mediated pneumonitis or metastatic disease.  The previously seen bilateral pleural effusions had resolved.  He returned to our care and nivolumab every 2 weeks was resumed in April.  CT chest was repeated in early May after 1 month back on therapy.  This was stable, without evidence of recurrent pleural effusions, immunotherapy toxicities, or evidence of metastatic disease.  He was switched to every 28-day nivolumab in August 2018.  He has had stable disease so has continued on nivolumab.  CT imaging in October 2018 revealed a stable 5 mm level 1B cervical lymph node, as well as a stable 8 mm exophytic lesion of the right face.  CT chest did not reveal any evidence of metastatic disease. Borderline cardiomegaly and a small amount of pericardial fluid/thickening was seen, but not reaccumulation of the pleural effusions.  CT head did not reveal any evidence of intracranial metastasis.  There were chronic ischemic changes and evidence of old infarcts.   Unfortunately, his wife passed away in 06-02-2018 and she was his primary caregiver His daughters have been caring for him since then.  CT scans have remained stable, so he has continued nivolumab every 4 weeks.  He was given mirtazapine for depression and weight loss the dose was increased to 15 mg.  He was then lost to follow-up from December 2021 to April 2022, at which time he resumed nivolumab and B12 every 4 weeks.   CT head, neck and chest in June 2022 did not reveal any evidence of progressive disease.  He was admitted at [facility] in November/December 2022 due to flu and pneumonia.  Nivolumab was placed on hold in December.  CT chest, abdomen and pelvis in February did not reveal any evidence of metastatic disease.  There was new extensive tree in bud nodularity in the right lung with most significant involvement of the right lower lobe. Findings were most consistent with an infectious or inflammatory process in a pattern suggestive of atypical infection including nontuberculous mycobacterium.  He was seen by Dr. [name], who recommended bronchoscopy, but the patient did not return for follow-up as he did not wish to pursue this procedure.  Nivolumab was resumed again in April.  He  was last CT scans in August did not reveal obvious metastatic disease.  There was an increase in his bilateral pleural effusions, stable pericardial effusion and mild ascites, felt to most likely be due to fluid overload.  He is on Entresto and follows with cardiology.     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 ([place])  01/24/2015 Cancer Staging   Staging form: Melanoma of the Skin, AJCC 8th Edition - Clinical stage from 01/24/2015: Stage IIB (cT3b, cN0, cM0) - Signed by [name], MD on 08/19/2021 Histopathologic type: Malignant melanoma, NOS (except juvenile melanoma [id number]) Stage prefix: Initial diagnosis Laterality: Right Lymph-vascular invasion (LVI): LVI not present (absent)/not identified Diagnostic confirmation: Positive  histology Specimen type: Excision Staged by: Managing physician Mitotic count: 10 Mitotic unit: mm2 Clark's level: Level IV Tumor-infiltrating lymphocytes: Unknown Neurotropism: Absent Presence of extranodal extension: Absent Breslow depth (mm): 40 Ulceration of the epidermis: Yes Microsatellites: No Primary tumor regression: Unknown Sentinel lymph node biopsy performed: No Matted nodes: No Prognostic indicators: Wide excision negative Stage used in treatment planning: Yes National guidelines used in treatment planning: Yes Type of national guideline used in treatment planning: NCCN   07/18/2020 - 06/18/2022 Chemotherapy   Patient is on Treatment Plan : MELANOMA Nivolumab + B12 q28d     07/18/2020 -  Chemotherapy   Patient is on Treatment Plan : MELANOMA Nivolumab (480) q28d     07/29/2020 Initial Diagnosis   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 ([place])       INTERVAL HISTORY:  Kevin [name] is here today for repeat clinical assessment. He denies fevers or chills. He denies pain. His appetite is good. His weight {Weight change:10426}.  REVIEW OF SYSTEMS:  Review of Systems  Constitutional:  Negative for appetite change, chills, fatigue, fever and unexpected weight change.  HENT:   Negative for lump/mass, mouth sores and sore throat.   Respiratory:  Negative for cough and shortness of breath.   Cardiovascular:  Negative for chest pain and leg swelling.  Gastrointestinal:  Negative for abdominal pain, constipation, diarrhea, nausea and vomiting.  Genitourinary:  Negative for difficulty urinating, dysuria, frequency and hematuria.   Musculoskeletal:  Negative for arthralgias, back pain and myalgias.  Skin:  Negative for itching, rash and wound.  Neurological:  Negative for dizziness, extremity weakness, headaches, light-headedness and numbness.  Hematological:  Negative for adenopathy.  Psychiatric/Behavioral:  Negative for depression and sleep disturbance. The patient is not  nervous/anxious.      VITALS:  There were no vitals taken for this visit.  Wt Readings from Last 3 Encounters:  08/29/22 168 lb (76.2 kg)  08/27/22 168 lb 9.6 oz (76.5 kg)  08/14/22 170 lb (77.1 kg)    There is no height or weight on file to calculate BMI.  Performance status (ECOG): {CHL ONC Q3448304  PHYSICAL EXAM:  Physical Exam Vitals and nursing note reviewed.  Constitutional:      General: He is not in acute distress.    Appearance: Normal appearance. He is normal weight.  HENT:     Head: Normocephalic and atraumatic.     Mouth/Throat:     Mouth: Mucous membranes are moist.     Pharynx: Oropharynx is clear. No oropharyngeal exudate or posterior oropharyngeal erythema.  Eyes:     General: No scleral icterus.    Extraocular Movements: Extraocular movements intact.     Conjunctiva/sclera: Conjunctivae normal.     Pupils: Pupils are equal, round, and reactive to light.  Cardiovascular:  Rate and Rhythm: Normal rate and regular rhythm.     Heart sounds: Normal heart sounds. No murmur heard.    No friction rub. No gallop.  Pulmonary:     Effort: Pulmonary effort is normal.     Breath sounds: Normal breath sounds. No wheezing, rhonchi or rales.  Abdominal:     General: Bowel sounds are normal. There is no distension.     Palpations: Abdomen is soft. There is no hepatomegaly, splenomegaly or mass.     Tenderness: There is no abdominal tenderness.  Musculoskeletal:        General: Normal range of motion.     Cervical back: Normal range of motion and neck supple. No tenderness.     Right lower leg: No edema.     Left lower leg: No edema.  Lymphadenopathy:     Cervical: No cervical adenopathy.     Upper Body:     Right upper body: No supraclavicular or axillary adenopathy.     Left upper body: No supraclavicular or axillary adenopathy.     Lower Body: No right inguinal adenopathy. No left inguinal adenopathy.  Skin:    General: Skin is warm and dry.      Coloration: Skin is not jaundiced.     Findings: No rash.  Neurological:     Mental Status: He is alert and oriented to person, place, and time.     Cranial Nerves: No cranial nerve deficit.  Psychiatric:        Mood and Affect: Mood normal.        Behavior: Behavior normal.        Thought Content: Thought content normal.     LABS:      Latest Ref Rng & Units 08/27/2022   12:00 AM 07/29/2022   12:00 AM 06/16/2022   12:00 AM  CBC  WBC  4.8     4.7     4.9      Hemoglobin 13.5 - 17.5 10.1     10.3     10.2      Hematocrit 41 - 53 31     32     31      Platelets 150 - 400 K/uL 130     113     104         This result is from an external source.      Latest Ref Rng & Units 08/27/2022   12:00 AM 07/29/2022   12:00 AM 06/16/2022   12:00 AM  CMP  BUN 4 - '21 24     31     30      '$ Creatinine 0.6 - 1.3 1.6     1.4     1.6      Sodium 137 - 147 140     140     142      Potassium 3.5 - 5.1 mEq/L 4.3     3.9     4.0      Chloride 99 - 108 107     107     108      CO2 13 - '22 30     24     28      '$ Calcium 8.7 - 10.7 9.0     9.1     8.7      Alkaline Phos 25 - 125 114     93     82      AST 14 -  40 28     46     25      ALT 10 - 40 U/L 33     46     35         This result is from an external source.     No results found for: "CEA1", "CEA" / No results found for: "CEA1", "CEA" No results found for: "PSA1" No results found for: "PPJ093" No results found for: "CAN125"  No results found for: "TOTALPROTELP", "ALBUMINELP", "A1GS", "A2GS", "BETS", "BETA2SER", "GAMS", "MSPIKE", "SPEI" Lab Results  Component Value Date   TIBC 356 05/21/2022   FERRITIN 46 05/21/2022   IRONPCTSAT 10 (L) 05/21/2022   No results found for: "LDH"  STUDIES:  No results found.    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)    Thrombocytopenia ([place]) 07/29/2022    Past Surgical History:  Procedure Laterality Date   APPENDECTOMY     CARDIAC CATHETERIZATION     CORONARY ANGIOPLASTY     PACEMAKER INSERTION     RIGHT/LEFT HEART CATH AND CORONARY ANGIOGRAPHY N/A 10/09/2021   Procedure: RIGHT/LEFT HEART CATH AND CORONARY ANGIOGRAPHY;  Surgeon: Nigel Mormon, MD;  Location: [facility];  Service: Cardiovascular;  Laterality: N/A;    Family History  Problem Relation Age of Onset   Breast cancer Mother    Diabetes Mellitus II Father    Ovarian cancer Sister    Cancer Brother    Cancer Brother    Cancer Maternal Uncle    Breast cancer Niece     Social History:  reports that he has been smoking cigarettes. He has a 30.00 pack-year smoking history. He has never used smokeless tobacco. He reports that he does not currently use alcohol. He reports that he does not currently use drugs.The patient is {Blank single:19197::"alone","accompanied by"} *** today.  Allergies: No Known Allergies  Current Medications: Current Outpatient Medications  Medication Sig Dispense Refill   albuterol (PROVENTIL) (2.5 MG/3ML) 0.083% nebulizer solution Take by nebulization.     albuterol (VENTOLIN HFA) 108 (90 Base) MCG/ACT inhaler Inhale into the lungs.     ALBUTEROL SULFATE PO Take by mouth.     amiodarone (PACERONE) 200 MG tablet Take 1 tablet (200 mg total) by mouth daily. 90 tablet 3   apixaban (ELIQUIS) 5 MG TABS tablet Take 1 tablet (5 mg total) by mouth 2 (two) times daily. 180 tablet 3   atorvastatin (LIPITOR) 80 MG tablet Take 1 tablet (80 mg total) by mouth at bedtime. 90 tablet 3   Fe Fum-FA-B Cmp-C-Zn-Mg-Mn-Cu (HEMOCYTE PLUS)  106-1 MG CAPS Take 1 capsule by mouth daily.     furosemide (LASIX) 40 MG tablet Take 1 tablet (40 mg total) by mouth every other day. 90 tablet 1   magnesium oxide (MAG-OX) 400 (240 Mg) MG tablet TAKE 1 TABLET BY MOUTH TWICE A DAY 180 tablet 1   metFORMIN (GLUCOPHAGE) 1000 MG tablet Take 500 mg by mouth 2 (  two) times daily.     metoprolol succinate (TOPROL-XL) 25 MG 24 hr tablet Take 0.5 tablets (12.5 mg total) by mouth daily. 45 tablet 3   ondansetron (ZOFRAN-ODT) 4 MG disintegrating tablet Take 1 tablet by mouth daily as needed.     potassium chloride SA (KLOR-CON M) 20 MEQ tablet Take 1 tablet (20 mEq total) by mouth daily. 90 tablet 3   sacubitril-valsartan (ENTRESTO) 24-26 MG Take 0.5 tablets by mouth 2 (two) times daily. 90 tablet 3   SYMBICORT 80-4.5 MCG/ACT inhaler Inhale into the lungs.     tamsulosin (FLOMAX) 0.4 MG CAPS capsule Take by mouth. (Patient not taking: Reported on 08/14/2022)     No current facility-administered medications for this visit.

## 2022-09-24 ENCOUNTER — Telehealth: Payer: Self-pay

## 2022-09-24 ENCOUNTER — Encounter: Payer: Self-pay | Admitting: Hematology and Oncology

## 2022-09-24 ENCOUNTER — Encounter: Payer: Self-pay | Admitting: Oncology

## 2022-09-24 ENCOUNTER — Other Ambulatory Visit: Payer: Self-pay | Admitting: Hematology and Oncology

## 2022-09-24 DIAGNOSIS — C4339 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: Secondary | ICD-10-CM | POA: Diagnosis not present

## 2022-09-24 DIAGNOSIS — D509 Iron deficiency anemia, unspecified: Secondary | ICD-10-CM | POA: Diagnosis not present

## 2022-09-24 DIAGNOSIS — D519 Vitamin B12 deficiency anemia, unspecified: Secondary | ICD-10-CM | POA: Diagnosis not present

## 2022-09-24 DIAGNOSIS — Z5112 Encounter for antineoplastic immunotherapy: Secondary | ICD-10-CM | POA: Diagnosis not present

## 2022-09-24 DIAGNOSIS — Z79899 Other long term (current) drug therapy: Secondary | ICD-10-CM | POA: Diagnosis not present

## 2022-09-24 LAB — FOLATE: Folate: 13.5 ng/mL (ref >5.9)

## 2022-09-24 LAB — FERRITIN: Ferritin: 108 ng/mL (ref 24–336)

## 2022-09-24 LAB — IRON AND TIBC
Iron: 52 ug/dL (ref 45–182)
Saturation Ratios: 16 % — ABNORMAL LOW (ref 17.9–39.5)
TIBC: 321 ug/dL (ref 250–450)
UIBC: 269 ug/dL

## 2022-09-24 LAB — LACTATE DEHYDROGENASE: LDH: 108 U/L (ref 98–192)

## 2022-09-24 LAB — T4: T4, Total: 8.8 ug/dL (ref 4.5–12.0)

## 2022-09-24 MED FILL — Nivolumab IV Soln 100 MG/10ML: INTRAVENOUS | Qty: 48 | Status: AC

## 2022-09-24 NOTE — Telephone Encounter (Signed)
Daughter made aware labs normal okay for treatment.

## 2022-09-24 NOTE — Telephone Encounter (Signed)
Daughter aware and voiced understanding.

## 2022-09-24 NOTE — Telephone Encounter (Signed)
-----   Message from [name], PA-C sent at 09/24/2022  8:31 AM EST ----- Please let his daughter know his labs are good for treatment this week. Thanks

## 2022-09-24 NOTE — Assessment & Plan Note (Addendum)
He last received IV iron in July.  He has persistent anemia, so will repeat iron studies today.  Most likely, this represents anemia of chronic disease.

## 2022-09-24 NOTE — Telephone Encounter (Signed)
Daughter aware.

## 2022-09-25 ENCOUNTER — Inpatient Hospital Stay: Payer: Medicare PPO

## 2022-09-25 VITALS — BP 125/89 | HR 60 | Temp 97.9°F | Resp 18 | Ht 67.0 in | Wt 168.0 lb

## 2022-09-25 DIAGNOSIS — Z5112 Encounter for antineoplastic immunotherapy: Secondary | ICD-10-CM | POA: Diagnosis not present

## 2022-09-25 DIAGNOSIS — C4339 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: Secondary | ICD-10-CM

## 2022-09-25 DIAGNOSIS — D509 Iron deficiency anemia, unspecified: Secondary | ICD-10-CM | POA: Diagnosis not present

## 2022-09-25 DIAGNOSIS — Z79899 Other long term (current) drug therapy: Secondary | ICD-10-CM | POA: Diagnosis not present

## 2022-09-25 DIAGNOSIS — D519 Vitamin B12 deficiency anemia, unspecified: Secondary | ICD-10-CM | POA: Diagnosis not present

## 2022-09-25 MED ORDER — HEPARIN SOD (PORK) LOCK FLUSH 100 UNIT/ML IV SOLN
500.0000 [IU] | Freq: Once | INTRAVENOUS | Status: AC | PRN
  Administered 2022-09-25: 500 [IU]

## 2022-09-25 MED ORDER — CYANOCOBALAMIN 1000 MCG/ML IJ SOLN
1000.0000 ug | Freq: Once | INTRAMUSCULAR | Status: AC
  Administered 2022-09-25: 1000 ug via INTRAMUSCULAR
  Filled 2022-09-25: qty 1

## 2022-09-25 MED ORDER — SODIUM CHLORIDE 0.9% FLUSH
10.0000 mL | INTRAVENOUS | Status: DC | PRN
  Administered 2022-09-25: 10 mL

## 2022-09-25 MED ORDER — SODIUM CHLORIDE 0.9 % IV SOLN: Freq: Once | INTRAVENOUS | Status: AC

## 2022-09-25 MED ORDER — SODIUM CHLORIDE 0.9 % IV SOLN
480.0000 mg | Freq: Once | INTRAVENOUS | Status: AC
  Administered 2022-09-25: 480 mg via INTRAVENOUS
  Filled 2022-09-25: qty 48

## 2022-09-25 NOTE — Patient Instructions (Signed)
Nivolumab Injection What is this medication? NIVOLUMAB (nye VOL ue mab) treats some types of cancer. It works by helping your immune system slow or stop the spread of cancer cells. It is a monoclonal antibody. This medicine may be used for other purposes; ask your health care provider or pharmacist if you have questions. COMMON BRAND NAME(S): Opdivo What should I tell my care team before I take this medication? They need to know if you have any of these conditions: Allogeneic stem cell transplant (uses someone else's stem cells) Autoimmune diseases, such as Crohn disease, ulcerative colitis, lupus History of chest radiation Nervous system problems, such as Guillain-Barre syndrome or myasthenia gravis Organ transplant An unusual or allergic reaction to nivolumab, other medications, foods, dyes, or preservatives Pregnant or trying to get pregnant Breast-feeding How should I use this medication? This medication is infused into a vein. It is given in a hospital or clinic setting. A special MedGuide will be given to you before each treatment. Be sure to read this information carefully each time. Talk to your care team about the use of this medication in children. While it may be prescribed for children as young as 12 years for selected conditions, precautions do apply. Overdosage: If you think you have taken too much of this medicine contact a poison control center or emergency room at once. NOTE: This medicine is only for you. Do not share this medicine with others. What if I miss a dose? Keep appointments for follow-up doses. It is important not to miss your dose. Call your care team if you are unable to keep an appointment. What may interact with this medication? Interactions have not been studied. This list may not describe all possible interactions. Give your health care provider a list of all the medicines, herbs, non-prescription drugs, or dietary supplements you use. Also tell them if you  smoke, drink alcohol, or use illegal drugs. Some items may interact with your medicine. What should I watch for while using this medication? Your condition will be monitored carefully while you are receiving this medication. You may need blood work while taking this medication. This medication may cause serious skin reactions. They can happen weeks to months after starting the medication. Contact your care team right away if you notice fevers or flu-like symptoms with a rash. The rash may be red or purple and then turn into blisters or peeling of the skin. You may also notice a red rash with swelling of the face, lips, or lymph nodes in your neck or under your arms. Tell your care team right away if you have any change in your eyesight. Talk to your care team if you are pregnant or think you might be pregnant. A negative pregnancy test is required before starting this medication. A reliable form of contraception is recommended while taking this medication and for 5 months after the last dose. Talk to your care team about effective forms of contraception. Do not breast-feed while taking this medication and for 5 months after the last dose. What side effects may I notice from receiving this medication? Side effects that you should report to your care team as soon as possible: Allergic reactions--skin rash, itching, hives, swelling of the face, lips, tongue, or throat Dry cough, shortness of breath or trouble breathing Eye pain, redness, irritation, or discharge with blurry or decreased vision Heart muscle inflammation--unusual weakness or fatigue, shortness of breath, chest pain, fast or irregular heartbeat, dizziness, swelling of the ankles, feet, or hands Hormone   gland problems--headache, sensitivity to light, unusual weakness or fatigue, dizziness, fast or irregular heartbeat, increased sensitivity to cold or heat, excessive sweating, constipation, hair loss, increased thirst or amount of urine,  tremors or shaking, irritability Infusion reactions--chest pain, shortness of breath or trouble breathing, feeling faint or lightheaded Kidney injury (glomerulonephritis)--decrease in the amount of urine, red or dark brown urine, foamy or bubbly urine, swelling of the ankles, hands, or feet Liver injury--right upper belly pain, loss of appetite, nausea, light-colored stool, dark yellow or brown urine, yellowing skin or eyes, unusual weakness or fatigue Pain, tingling, or numbness in the hands or feet, muscle weakness, change in vision, confusion or trouble speaking, loss of balance or coordination, trouble walking, seizures Rash, fever, and swollen lymph nodes Redness, blistering, peeling, or loosening of the skin, including inside the mouth Sudden or severe stomach pain, bloody diarrhea, fever, nausea, vomiting Side effects that usually do not require medical attention (report these to your care team if they continue or are bothersome): Bone, joint, or muscle pain Diarrhea Fatigue Loss of appetite Nausea Skin rash This list may not describe all possible side effects. Call your doctor for medical advice about side effects. You may report side effects to FDA at 1-800-FDA-1088. Where should I keep my medication? This medication is given in a hospital or clinic. It will not be stored at home. NOTE: This sheet is a summary. It may not cover all possible information. If you have questions about this medicine, talk to your doctor, pharmacist, or health care provider.  2023 Elsevier/Gold Standard (2022-02-24 00:00:00)  

## 2022-09-29 NOTE — Progress Notes (Signed)
Cancelled due to illness per family.

## 2022-10-17 ENCOUNTER — Other Ambulatory Visit: Payer: Medicare PPO

## 2022-10-17 DIAGNOSIS — Z515 Encounter for palliative care: Secondary | ICD-10-CM

## 2022-10-17 NOTE — Progress Notes (Signed)
COMMUNITY PALLIATIVE CARE SW NOTE  PATIENT NAME: Kevin [name] DOB: 1941-07-07 MRN: [id number]  PRIMARY CARE PROVIDER: [name], PA-C  RESPONSIBLE PARTY:  Acct ID - Guarantor Home Phone Work Phone Relationship Acct Type  [id number] MICAHEL, [phone]  Self P/F     [place], [place], [place] [address]     PLAN OF CARE and INTERVENTIONS:              PC SW outreached patients daughter, Kevin [name], to review new PC criteria and territory coverage. Sw advised that PC will no longer be able to provide in person visits, due to patient now being outside of in person coverage territory.   SW gave the option to transition to a more local Palliative care provider that  may be able to provide in person visits. Daughter shared that currently patient continues with cancer treatment and  is doing well and they are okay with virtual check ins.   PC will continue to follow and check in with patient/daughter in 3-4 weeks.   SOCIAL HX:  Social History   Tobacco Use   Smoking status: Some Days    Packs/day: 0.50    Years: 60.00    Total pack years: 30.00    Types: Cigarettes   Smokeless tobacco: Never  Substance Use Topics   Alcohol use: Not Currently    CODE STATUS: DNR ADVANCED DIRECTIVES: Y MOST FORM COMPLETE:  Y HOSPICE EDUCATION PROVIDED: N  Time spent: 20 min      [place], [place]

## 2022-10-22 ENCOUNTER — Telehealth: Payer: Self-pay | Admitting: Oncology

## 2022-10-22 ENCOUNTER — Inpatient Hospital Stay: Payer: Medicare PPO | Admitting: Hematology and Oncology

## 2022-10-22 ENCOUNTER — Inpatient Hospital Stay: Payer: Medicare PPO

## 2022-10-22 NOTE — Progress Notes (Deleted)
[facility]  [address] 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  He had a hypermetabolic right submandibular node on PET, but no evidence of distant metastasis.  His original lesion was diagnosed in March 2016.  He underwent excision, but refused aggressive therapy.  He has been receiving palliative nivolumab since November 2016, with several interruptions. His treatment was on hold from December 2022 to April 2023 due to cardiorespiratory issues. He continues to tolerate nivolumab without difficulty.  CT imaging in August 2023 remains without evidence of metastatic disease.  However, there were increasing bilateral pleural effusions and small volume abdominal pelvic ascites, with stable pericardial effusion, felt to be most likely due to fluid overload.  He is on Entresto.  He continues to tolerate nivolumab without significant difficulty.  He has been having increased diarrhea for about a week, but is afraid to take an antidiarrheal due to previous episode of obstipation.  If his diarrhea worsens, he will contact us.  We will proceed with that this week.  We will plan to see him back in 4 weeks for repeat clinical assessment prior to his next nivolumab.    B12 deficiency anemia He continues B12 injections monthly.  Iron deficiency anemia He last received IV iron in July.  He has had persistent anemia.  Iron studies in November were normal except for saturation of 16% with 17.9% being normal.  We will continue to monitor this.   The patient understands the plans discussed today and is in agreement with them.  He knows to contact our office if he develops concerns prior to his next appointment.   I provided *** minutes of  face-to-face time during this encounter and > 50% was spent counseling as documented under my assessment and plan.    [name], PA-C  [facility] AT [facility] [address] [place] [address] Dept: [phone] Dept Fax: [phone]   No orders of the defined types were placed in this encounter.     CHIEF COMPLAINT:  CC: ***  Current Treatment:  ***  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 ([place])  01/24/2015 Cancer Staging   Staging form: Melanoma of the Skin, AJCC 8th Edition - Clinical stage from 01/24/2015: Stage IIB (cT3b, cN0, cM0) - Signed by [name], MD on 08/19/2021 Histopathologic type: Malignant melanoma, NOS (except juvenile melanoma [id number]) Stage prefix: Initial diagnosis Laterality: Right Lymph-vascular invasion (LVI): LVI not present (absent)/not identified Diagnostic confirmation: Positive histology Specimen type: Excision Staged by: Managing physician Mitotic count: 10 Mitotic unit: mm2 Clark's level: Level IV Tumor-infiltrating lymphocytes: Unknown Neurotropism: Absent Presence of extranodal extension: Absent Breslow depth (mm): 40 Ulceration of the epidermis: Yes Microsatellites: No Primary tumor regression: Unknown Sentinel lymph node biopsy performed: No Matted nodes: No Prognostic indicators: Wide excision negative Stage used in treatment planning: Yes National guidelines used in treatment planning: Yes Type of national guideline used in treatment planning: NCCN   07/18/2020 - 06/18/2022 Chemotherapy   Patient is on Treatment Plan : MELANOMA Nivolumab + B12 q28d     07/18/2020 -  Chemotherapy   Patient is on Treatment Plan : MELANOMA Nivolumab (480) q28d  07/29/2020 Initial Diagnosis   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 [facility])       INTERVAL HISTORY:  Kevin [name] is here today for repeat clinical assessment. He  denies fevers or chills. He denies pain. His appetite is good. His weight {Weight change:10426}.  REVIEW OF SYSTEMS:  Review of Systems - Oncology   VITALS:  There were no vitals taken for this visit.  Wt Readings from Last 3 Encounters:  09/25/22 168 lb 0.6 oz (76.2 kg)  09/23/22 168 lb 6.4 oz (76.4 kg)  08/29/22 168 lb (76.2 kg)    There is no height or weight on file to calculate BMI.  Performance status (ECOG): {CHL ONC [id number]  PHYSICAL EXAM:  Physical Exam  LABS:      Latest Ref Rng & Units 09/23/2022    2:15 PM 08/27/2022   12:00 AM 07/29/2022   12:00 AM  CBC  WBC 4.0 - 10.5 K/uL 4.9  4.8     4.7      Hemoglobin 13.0 - 17.0 g/dL 10.2  10.1     10.3      Hematocrit 39.0 - 52.0 % 33.1  31     32      Platelets 150 - 400 K/uL 138  130     113         This result is from an external source.      Latest Ref Rng & Units 09/23/2022    2:15 PM 08/27/2022   12:00 AM 07/29/2022   12:00 AM  CMP  Glucose 70 - 99 mg/dL 113     BUN 8 - 23 mg/dL 37  24     31      Creatinine 0.61 - 1.24 mg/dL 1.47  1.6     1.4      Sodium 135 - 145 mmol/L 144  140     140      Potassium 3.5 - 5.1 mmol/L 4.1  4.3     3.9      Chloride 98 - 111 mmol/L 110  107     107      CO2 22 - 32 mmol/L '27  30     24      '$ Calcium 8.9 - 10.3 mg/dL 9.2  9.0     9.1      Total Protein 6.5 - 8.1 g/dL 6.9     Total Bilirubin 0.3 - 1.2 mg/dL 0.7     Alkaline Phos 38 - 126 U/L 85  114     93      AST 15 - 41 U/L 18  28     46      ALT 0 - 44 U/L 19  33     46         This result is from an external source.     No results found for: "CEA1", "CEA" / No results found for: "CEA1", "CEA" No results found for: "PSA1" No results found for: "ZOX096" No results found for: "CAN125"  No results found for: "TOTALPROTELP", "ALBUMINELP", "A1GS", "A2GS", "BETS", "BETA2SER", "GAMS", "MSPIKE", "SPEI" Lab Results  Component Value Date   TIBC 321 09/24/2022   TIBC 356 05/21/2022   FERRITIN 108 09/24/2022    FERRITIN 46 05/21/2022   IRONPCTSAT 16 (L) 09/24/2022   IRONPCTSAT 10 (L) 05/21/2022   Lab Results  Component Value Date   LDH 108 09/24/2022    STUDIES:  No results found.    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)    Thrombocytopenia ([place]) 07/29/2022    Past Surgical History:  Procedure Laterality Date   APPENDECTOMY     CARDIAC CATHETERIZATION     CORONARY ANGIOPLASTY     PACEMAKER INSERTION     RIGHT/LEFT HEART CATH AND CORONARY ANGIOGRAPHY N/A 10/09/2021   Procedure: RIGHT/LEFT HEART CATH AND CORONARY ANGIOGRAPHY;  Surgeon: Nigel Mormon, MD;  Location: [facility];  Service: Cardiovascular;  Laterality: N/A;    Family History  Problem Relation Age of Onset   Breast cancer Mother    Diabetes Mellitus II Father    Ovarian cancer Sister    Cancer Brother    Cancer Brother    Cancer Maternal Uncle    Breast cancer Niece     Social History:  reports that he has been smoking cigarettes. He has a 30.00 pack-year smoking history. He has never used smokeless tobacco. He reports that he does not currently use alcohol. He reports that he does not currently use drugs.The patient is {Blank single:19197::"alone","accompanied by"} *** today.  Allergies: No Known Allergies  Current Medications: Current Outpatient Medications  Medication Sig Dispense Refill   albuterol (PROVENTIL) (2.5 MG/3ML) 0.083% nebulizer solution Take by nebulization.     albuterol (VENTOLIN HFA) 108 (90 Base) MCG/ACT inhaler Inhale  into the lungs.     ALBUTEROL SULFATE PO Take by mouth.     amiodarone (PACERONE) 200 MG tablet Take 1 tablet (200 mg total) by mouth daily. 90 tablet 3   apixaban (ELIQUIS) 5 MG TABS tablet Take 1 tablet (5 mg total) by mouth 2 (two) times daily. 180 tablet 3   atorvastatin (LIPITOR) 80 MG tablet Take 1 tablet (80 mg total) by mouth at bedtime. 90 tablet 3   Fe Fum-FA-B Cmp-C-Zn-Mg-Mn-Cu (HEMOCYTE PLUS) 106-1 MG CAPS Take 1 capsule by mouth daily.     furosemide (LASIX) 40 MG tablet Take 1 tablet (40 mg total) by mouth every other day. 90 tablet 1   magnesium oxide (MAG-OX) 400 (240 Mg) MG tablet TAKE 1 TABLET BY MOUTH TWICE A DAY 180 tablet 1   metFORMIN (GLUCOPHAGE) 1000 MG tablet Take 500 mg by mouth 2 (two) times daily.     metoprolol succinate (TOPROL-XL) 25 MG 24 hr tablet Take 0.5 tablets (12.5 mg total) by mouth daily. 45 tablet 3   ondansetron (ZOFRAN-ODT) 4 MG disintegrating tablet Take 1 tablet by mouth daily as needed.     potassium chloride SA (KLOR-CON M) 20 MEQ tablet Take 1 tablet (20 mEq total) by mouth daily. 90 tablet 3   SYMBICORT 80-4.5 MCG/ACT inhaler Inhale into the lungs.     tamsulosin (FLOMAX) 0.4 MG CAPS capsule Take by mouth. (Patient not taking: Reported on 08/14/2022)     No current facility-administered medications for this visit.

## 2022-10-22 NOTE — Assessment & Plan Note (Deleted)
He continues B12 injections monthly.

## 2022-10-22 NOTE — Telephone Encounter (Signed)
10/22/22 patients daughter left msg to cancel all appts.She will call back to reschedule

## 2022-10-22 NOTE — Assessment & Plan Note (Deleted)
He last received IV iron in July.  He has had persistent anemia.  Iron studies in November were normal except for saturation of 16% with 17.9% being normal.  We will continue to monitor this.

## 2022-10-22 NOTE — Assessment & Plan Note (Deleted)
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  He had a hypermetabolic right submandibular node on PET, but no evidence of distant metastasis.  His original lesion was diagnosed in March 2016.  He underwent excision, but refused aggressive therapy.  He has been receiving palliative nivolumab since November 2016, with several interruptions. His treatment was on hold from December 2022 to April 2023 due to cardiorespiratory issues. He continues to tolerate nivolumab without difficulty.  CT imaging in August 2023 remains without evidence of metastatic disease.  However, there were increasing bilateral pleural effusions and small volume abdominal pelvic ascites, with stable pericardial effusion, felt to be most likely due to fluid overload.  He is on Entresto.  He continues to tolerate nivolumab without significant difficulty.  He has been having increased diarrhea for about a week, but is afraid to take an antidiarrheal due to previous episode of obstipation.  If his diarrhea worsens, he will contact us.  We will proceed with that this week.  We will plan to see him back in 4 weeks for repeat clinical assessment prior to his next nivolumab.

## 2022-10-23 ENCOUNTER — Encounter: Payer: Self-pay | Admitting: Oncology

## 2022-10-24 ENCOUNTER — Inpatient Hospital Stay: Payer: Medicare PPO

## 2022-11-04 ENCOUNTER — Other Ambulatory Visit: Payer: Medicare PPO

## 2022-11-04 ENCOUNTER — Ambulatory Visit: Payer: Medicare PPO | Admitting: Hematology and Oncology

## 2022-11-07 ENCOUNTER — Inpatient Hospital Stay: Payer: Medicare PPO

## 2022-11-14 ENCOUNTER — Telehealth: Payer: Self-pay

## 2022-11-14 ENCOUNTER — Ambulatory Visit: Payer: Medicare PPO | Admitting: Cardiology

## 2022-11-14 ENCOUNTER — Encounter: Payer: Self-pay | Admitting: Oncology

## 2022-11-14 NOTE — Telephone Encounter (Signed)
Patient's home BP is well controlled (soft even at times) on current antihypertensive regimen.  Average Systolic BP Level 562.13 mmHg Lowest Systolic BP Level 086 mmHg Highest Systolic BP Level 578 mmHg  Average Diastolic BP Level 46.96 mmHg Lowest Diastolic BP Level 56 mmHg Highest Diastolic BP Level 73 mmHg  Average Pulse Level 59.62 BPM Lowest Pulse Level 50 BPM Highest Pulse Level 80 BPM  11/13/2022 Thursday at 06:23 PM 121 / 68      11/12/2022 Wednesday at 03:52 PM 112 / 73      11/10/2022 Monday at 08:28 PM 109 / 63      11/09/2022 'Sunday at 10:14 PM 118 / 68      11/07/2022 Friday at 12:00 PM 115 / 63      11/05/2022 Wednesday at 10:42 PM 129 / 70      11/03/2022 Monday at 12:41 PM 110 / 66      11/02/2022 Sunday at 09:41 PM 108 / 61      10/30/2022 Thursday at 10:14 PM 135 / 60      10/29/2022 Wednesday at 01:12 PM 111 / 62      12'$ /19/2023 Tuesday at 05:30 PM 120 / 69

## 2022-11-15 DIAGNOSIS — I5021 Acute systolic (congestive) heart failure: Secondary | ICD-10-CM | POA: Diagnosis not present

## 2022-11-16 NOTE — Telephone Encounter (Signed)
RPM data reviewed. BP excellent control, continue same for nwo

## 2022-11-26 ENCOUNTER — Inpatient Hospital Stay: Payer: Medicare PPO

## 2022-11-26 ENCOUNTER — Other Ambulatory Visit: Payer: Self-pay | Admitting: Oncology

## 2022-11-26 ENCOUNTER — Inpatient Hospital Stay: Payer: Medicare PPO | Admitting: Oncology

## 2022-11-26 DIAGNOSIS — C4339 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: Secondary | ICD-10-CM

## 2022-11-26 NOTE — Progress Notes (Incomplete)
[facility]  [address] 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  He had a hypermetabolic right submandibular node on PET, but no evidence of distant metastasis.  His original lesion was diagnosed in March 2016.  He underwent excision, but refused aggressive therapy.  He has been receiving palliative nivolumab since November 2016, with several interruptions. His treatment was on hold from December 2022 to April 2023 due to cardiorespiratory issues. He continues to tolerate nivolumab without difficulty.  CT imaging in August 2023 remains without evidence of metastatic disease.  However, there were increasing bilateral pleural effusions and small volume abdominal pelvic ascites, with stable pericardial effusion, felt to be most likely due to fluid overload.  He is on Entresto.   B12 deficiency anemia He had missed several B12 injections, but these were resumed and he has continued them monthly.  His hemoglobin had improved with the B12 injections, but then he was found to have be iron deficient.  His B12 level last month was good.   Iron deficiency anemia He has had chronic anemia, but in July was found to have iron deficiency, so was treated with IV iron.  His hemoglobin improved somewhat since receiving the iron but now is back to 10.1.   Thrombocytopenia ([place]) He is new mild thrombocytopenia of uncertain etiology.  This may be related to his treatment.  This has nearly resolved now.   Plan He will continue monthly infusions of nivolumab.  We will plan to see him back in 4 weeks with a CBC and comprehensive metabolic panel prior to his next treatment.  The patient understands the plans discussed today and is in agreement with them.  He  knows to contact our office if he develops concerns prior to his next appointment.  I provided 20 minutes of face-to-face time during this this encounter and > 50% was spent counseling as documented under my assessment and plan.    [facility] [address] [place] [address] Dept: [phone] Dept Fax: [phone]   No orders of the defined types were placed in this encounter.     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  His original lesion was diagnosed in March 2016.  He underwent excision, but refused aggressive surgery.  The lesion was at least 4 mm thick with a Clark's level 4, and a mitotic index 10/mm.  The margins were positive, but wide reexcision revealed no residual melanoma.  He had recurrence within 2 months and the lesion had been steadily growing.  He initially had refused seeing a medical oncologist, but finally came to [place] for evaluation and treatment in October 2016.  He does have extensive comorbidities including COPD, diabetes, hypertension, coronary artery disease, history of myocardial infarction, and pacemaker with AICD.  PET revealed hypermetabolism in the right submandibular node, but no evidence of distant metastasis.  He was having significant pain and requiring regular hydrocodone doses.  He also had severe disfigurement and avoided going out in public because of the facial lesion.  He has been  receiving palliative nivolumab since November 2016 and has had a good response with a decrease in the right facial tumor.  He has had associated hypomagnesemia and hypokalemia, so is on oral magnesium and potassium supplements. He has had mild chronic anemia, which has been stable. He was found to have B12 deficiency  and continues B12 injections monthly.  CT head/neck in May 2017 revealed some nonspecific skin thickening in the right face in the area of his melanoma in the right submandibular lymph node had decreased in size, now measuring 6 x 12 mm, but still prominent when compared to the left submandibular node.  He had 15 cycles of nivolumab and relocated to continue to get the same treatment.  He had his 30th dose in February 2018 and then returned to the [place] area.     He was admitted to the hospital in early March 2018 for congestive heart failure associated with pleural and pericardial effusions.  He had no leukocytosis, fever or purulent sputum.  The echocardiogram revealed severe diffuse hypokinesis with mild concentric hypertrophy and markedly depressed ejection fraction of 20 to 25%, associated with moderate mitral regurgitation and moderate left atrial dilatation.  He improved with diuresis.  CT chest in March 2018 revealed stable cardiomegaly and stable small pericardial effusion, but no evidence of immune mediated pneumonitis or metastatic disease.  The previously seen bilateral pleural effusions had resolved.  He returned to our care and nivolumab every 2 weeks was resumed in April.  CT chest was repeated in early May after 1 month back on therapy.  This was stable, without evidence of recurrent pleural effusions, immunotherapy toxicities, or evidence of metastatic disease.  He was switched to every 28-day nivolumab in August 2018.  He has had stable disease so has continued on nivolumab.  CT imaging in October 2018 revealed a stable 5 mm level 1B cervical lymph node, as well as a stable 8 mm exophytic lesion of the right face.  CT chest did not reveal any evidence of metastatic disease. Borderline cardiomegaly and a small amount of pericardial fluid/thickening was seen, but not reaccumulation of the pleural effusions.  CT head did not reveal any evidence of intracranial metastasis.  There were chronic  ischemic changes and evidence of old infarcts.   Unfortunately, his wife passed away in 18-Jun-2018 and she was his primary caregiver His daughters have been caring for him since then.  CT scans have remained stable, so he has continued nivolumab every 4 weeks.  He was given mirtazapine for depression and weight loss the dose was increased to 15 mg.  He was then lost to follow-up from December 2021 to April 2022, at which time he resumed nivolumab and B12 every 4 weeks.   CT head, neck and chest in June 2022 did not reveal any evidence of progressive disease.  He was admitted at [facility] in November/December 2022 due to flu and pneumonia.  Nivolumab was placed on hold in December.  CT chest, abdomen and pelvis in February did not reveal any evidence of metastatic disease.  There was new extensive tree in bud nodularity in the right lung with most significant involvement of the right lower lobe. Findings were most consistent with an infectious or inflammatory process in a pattern suggestive of atypical infection including nontuberculous mycobacterium.  He was seen by Dr. [name], who recommended bronchoscopy, but the patient did not return for follow-up as he did not wish to pursue this procedure.  Nivolumab was resumed again in  April.  He was last CT scans in August did not reveal obvious metastatic disease.  There was an increase in his bilateral pleural effusions, stable pericardial effusion and mild ascites, felt to most likely be due to fluid overload.  He is on Entresto and follows with cardiology.   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 ([place])  01/24/2015 Cancer Staging   Staging form: Melanoma of the Skin, AJCC 8th Edition - Clinical stage from 01/24/2015: Stage IIB (cT3b, cN0, cM0) - Signed by [name], MD on 08/19/2021 Histopathologic type: Malignant melanoma, NOS (except juvenile melanoma [id number]) Stage prefix: Initial diagnosis Laterality: Right Lymph-vascular  invasion (LVI): LVI not present (absent)/not identified Diagnostic confirmation: Positive histology Specimen type: Excision Staged by: Managing physician Mitotic count: 10 Mitotic unit: mm2 Clark's level: Level IV Tumor-infiltrating lymphocytes: Unknown Neurotropism: Absent Presence of extranodal extension: Absent Breslow depth (mm): 40 Ulceration of the epidermis: Yes Microsatellites: No Primary tumor regression: Unknown Sentinel lymph node biopsy performed: No Matted nodes: No Prognostic indicators: Wide excision negative Stage used in treatment planning: Yes National guidelines used in treatment planning: Yes Type of national guideline used in treatment planning: NCCN   07/18/2020 - 06/18/2022 Chemotherapy   Patient is on Treatment Plan : MELANOMA Nivolumab + B12 q28d     07/18/2020 -  Chemotherapy   Patient is on Treatment Plan : MELANOMA Nivolumab (480) q28d     07/29/2020 Initial Diagnosis   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 ([place])       INTERVAL HISTORY:  [place] is here today for repeat clinical assessment prior to nivolumab.      He denies any new symptoms.  He reports stable shortness of breath.  He denies cough or chest pain.  He states he does uses oxygen at night as needed.    He reports this persistent lower extremity edema, which improves overnight.    The cardiologist has been working on diuresis and his BUN is 24 with a creatinine of 1.6.    The rest of his CMP is unremarkable other than a nonfasting blood sugar of 185.    His hemoglobin has dropped from 10.3 to 10.1 but his platelet count came up to 130,000 from 113,000.  He has intermittent diarrhea not requiring medication.    He denies itching or skin rash.  He denies abnormal bleeding.  He denies melena or hematochezia.    He denies fevers or chills. He denies pain. His appetite is good. His weight has decreased 2 pounds over last 6 weeks .  He ambulates with a cane.  He states he is up during the  day and walks outside as much as he can.  REVIEW OF SYSTEMS:  Review of Systems  Constitutional: Negative.  Negative for appetite change, chills, diaphoresis, fatigue, fever and unexpected weight change.  HENT:  Negative.  Negative for hearing loss, lump/mass, mouth sores, nosebleeds, sore throat, tinnitus, trouble swallowing and voice change.   Eyes: Negative.  Negative for eye problems and icterus.  Respiratory:  Positive for shortness of breath (Stable). Negative for chest tightness, cough, hemoptysis and wheezing.   Cardiovascular: Negative.  Negative for chest pain, leg swelling and palpitations.  Gastrointestinal: Negative.  Negative for abdominal distention, abdominal pain, blood in stool, constipation, diarrhea, nausea, rectal pain and vomiting.  Endocrine: Negative.   Genitourinary: Negative.  Negative for bladder incontinence, difficulty urinating, dyspareunia, dysuria, frequency, hematuria, nocturia, pelvic pain and penile discharge.   Musculoskeletal: Negative.  Negative for  arthralgias, back pain, flank pain, gait problem, myalgias, neck pain and neck stiffness.  Skin: Negative.  Negative for itching, rash and wound.  Neurological: Negative.  Negative for dizziness, extremity weakness, gait problem, headaches, light-headedness, numbness, seizures and speech difficulty.  Hematological:  Negative for adenopathy. Bruises/bleeds easily.  Psychiatric/Behavioral:  Positive for confusion. Negative for decreased concentration, depression, sleep disturbance and suicidal ideas. The patient is not nervous/anxious.      VITALS:  There were no vitals taken for this visit.  Wt Readings from Last 3 Encounters:  09/25/22 168 lb 0.6 oz (76.2 kg)  09/23/22 168 lb 6.4 oz (76.4 kg)  08/29/22 168 lb (76.2 kg)    There is no height or weight on file to calculate BMI.  Performance status (ECOG): 1 - Symptomatic but completely ambulatory  PHYSICAL EXAM:  Physical Exam Vitals and nursing note  reviewed.  Constitutional:      General: He is not in acute distress.    Appearance: Normal appearance. He is normal weight.  HENT:     Head: Normocephalic and atraumatic.     Mouth/Throat:     Mouth: Mucous membranes are moist.     Pharynx: Oropharynx is clear. No oropharyngeal exudate or posterior oropharyngeal erythema.  Eyes:     General: No scleral icterus.    Extraocular Movements: Extraocular movements intact.     Conjunctiva/sclera: Conjunctivae normal.     Pupils: Pupils are equal, round, and reactive to light.  Cardiovascular:     Rate and Rhythm: Normal rate and regular rhythm.     Heart sounds: Normal heart sounds. No murmur heard.    No friction rub. No gallop.  Pulmonary:     Effort: Pulmonary effort is normal.     Breath sounds: Examination of the right-lower field reveals decreased breath sounds and rales. Examination of the left-lower field reveals decreased breath sounds and rales. Decreased breath sounds and rales present. No wheezing or rhonchi.  Abdominal:     General: Bowel sounds are normal. There is no distension.     Palpations: Abdomen is soft. There is no fluid wave, hepatomegaly, splenomegaly or mass.     Tenderness: There is no abdominal tenderness.     Comments: Mild edema of the lower abdomen  Musculoskeletal:        General: Normal range of motion.     Cervical back: Normal range of motion and neck supple. No tenderness.     Right lower leg: Edema (2+ to the knees) present.     Left lower leg: Edema (2+ to the knees) present.  Lymphadenopathy:     Cervical: No cervical adenopathy.  Skin:    General: Skin is warm and dry.     Coloration: Skin is not jaundiced.     Findings: No rash.  Neurological:     Mental Status: He is alert and oriented to person, place, and time.     Cranial Nerves: No cranial nerve deficit.  Psychiatric:        Mood and Affect: Mood normal.        Behavior: Behavior normal.        Thought Content: Thought content normal.      LABS:      Latest Ref Rng & Units 09/23/2022    2:15 PM 08/27/2022   12:00 AM 07/29/2022   12:00 AM  CBC  WBC 4.0 - 10.5 K/uL 4.9  4.8     4.7      Hemoglobin 13.0 - 17.0  g/dL 10.2  10.1     10.3      Hematocrit 39.0 - 52.0 % 33.1  31     32      Platelets 150 - 400 K/uL 138  130     113         This result is from an external source.       Latest Ref Rng & Units 09/23/2022    2:15 PM 08/27/2022   12:00 AM 07/29/2022   12:00 AM  CMP  Glucose 70 - 99 mg/dL 113     BUN 8 - 23 mg/dL 37  24     31      Creatinine 0.61 - 1.24 mg/dL 1.47  1.6     1.4      Sodium 135 - 145 mmol/L 144  140     140      Potassium 3.5 - 5.1 mmol/L 4.1  4.3     3.9      Chloride 98 - 111 mmol/L 110  107     107      CO2 22 - 32 mmol/L '27  30     24      '$ Calcium 8.9 - 10.3 mg/dL 9.2  9.0     9.1      Total Protein 6.5 - 8.1 g/dL 6.9     Total Bilirubin 0.3 - 1.2 mg/dL 0.7     Alkaline Phos 38 - 126 U/L 85  114     93      AST 15 - 41 U/L 18  28     46      ALT 0 - 44 U/L 19  33     46         This result is from an external source.    No results found for: "CEA1", "CEA" / No results found for: "CEA1", "CEA" No results found for: "PSA1" No results found for: "CZY606" No results found for: "CAN125"  No results found for: "TOTALPROTELP", "ALBUMINELP", "A1GS", "A2GS", "BETS", "BETA2SER", "GAMS", "MSPIKE", "SPEI" Lab Results  Component Value Date   TIBC 321 09/24/2022   TIBC 356 05/21/2022   FERRITIN 108 09/24/2022   FERRITIN 46 05/21/2022   IRONPCTSAT 16 (L) 09/24/2022   IRONPCTSAT 10 (L) 05/21/2022   Lab Results  Component Value Date   LDH 108 09/24/2022    Ref Range & Units 2 mo ago 4 mo ago  Folate >5.9 ng/mL 13.5 45.5 CM      Ref Range & Units 2 mo ago 3 mo ago  TSH 0.350 - 4.500 uIU/mL 4.432 4.084 CM    Ref Range & Units 2 mo ago 3 mo ago  T4, Total 4.5 - 12.0 ug/dL 8.8 9.3 CM    STUDIES:      HISTORY:   Past Medical History:  Diagnosis Date   Anemia 05/20/2022    Cancer (HCC)    CHF (congestive heart failure) (HCC)    COPD (chronic obstructive pulmonary disease) ([place])    Diabetes mellitus without complication ([place])    History of myocardial infarction    Hyperlipidemia    Hypertension    Iron deficiency anemia 05/21/2022   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) ([place])    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) ([place])    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) ([place])    Thrombocytopenia ([place]) 07/29/2022    Past Surgical History:  Procedure Laterality Date  APPENDECTOMY     CARDIAC CATHETERIZATION     CORONARY ANGIOPLASTY     PACEMAKER INSERTION     RIGHT/LEFT HEART CATH AND CORONARY ANGIOGRAPHY N/A 10/09/2021   Procedure: RIGHT/LEFT HEART CATH AND CORONARY ANGIOGRAPHY;  Surgeon: Nigel Mormon, MD;  Location: [facility];  Service: Cardiovascular;  Laterality: N/A;    Family History  Problem Relation Age of Onset   Breast cancer Mother    Diabetes Mellitus II Father    Ovarian cancer Sister    Cancer Brother    Cancer Brother    Cancer Maternal Uncle    Breast cancer Niece     Social History:  reports that he has been smoking cigarettes. He has a 30.00 pack-year smoking history. He has never used smokeless tobacco. He reports that he does not currently use alcohol. He reports that he does not currently use drugs.The patient is alone today.  Allergies: No Known Allergies  Current Medications: Current Outpatient Medications  Medication Sig Dispense Refill   albuterol (PROVENTIL) (2.5 MG/3ML) 0.083% nebulizer solution Take by nebulization.     albuterol (VENTOLIN HFA) 108 (90 Base) MCG/ACT inhaler Inhale into the lungs.     ALBUTEROL SULFATE PO Take by mouth.     amiodarone (PACERONE) 200 MG tablet Take 1 tablet (200 mg total) by mouth daily. 90 tablet 3   apixaban (ELIQUIS) 5 MG TABS tablet Take 1 tablet (5 mg total) by mouth 2 (two) times daily. 180 tablet 3   atorvastatin (LIPITOR) 80 MG tablet  Take 1 tablet (80 mg total) by mouth at bedtime. 90 tablet 3   Fe Fum-FA-B Cmp-C-Zn-Mg-Mn-Cu (HEMOCYTE PLUS) 106-1 MG CAPS Take 1 capsule by mouth daily.     furosemide (LASIX) 40 MG tablet Take 1 tablet (40 mg total) by mouth every other day. 90 tablet 1   magnesium oxide (MAG-OX) 400 (240 Mg) MG tablet TAKE 1 TABLET BY MOUTH TWICE A DAY 180 tablet 1   metFORMIN (GLUCOPHAGE) 1000 MG tablet Take 500 mg by mouth 2 (two) times daily.     metoprolol succinate (TOPROL-XL) 25 MG 24 hr tablet Take 0.5 tablets (12.5 mg total) by mouth daily. 45 tablet 3   ondansetron (ZOFRAN-ODT) 4 MG disintegrating tablet Take 1 tablet by mouth daily as needed.     potassium chloride SA (KLOR-CON M) 20 MEQ tablet Take 1 tablet (20 mEq total) by mouth daily. 90 tablet 3   SYMBICORT 80-4.5 MCG/ACT inhaler Inhale into the lungs.     tamsulosin (FLOMAX) 0.4 MG CAPS capsule Take by mouth. (Patient not taking: Reported on 08/14/2022)     No current facility-administered medications for this visit.      I,Jasmine M Lassiter,acting as a scribe for [name], MD.,have documented all relevant documentation on the behalf of [name], MD,as directed by  [name], MD while in the presence of [name], MD.

## 2022-11-27 ENCOUNTER — Encounter: Payer: Self-pay | Admitting: Oncology

## 2022-11-28 ENCOUNTER — Inpatient Hospital Stay: Payer: Medicare PPO

## 2022-12-01 ENCOUNTER — Inpatient Hospital Stay: Payer: Medicare PPO | Attending: Oncology

## 2022-12-01 ENCOUNTER — Inpatient Hospital Stay (INDEPENDENT_AMBULATORY_CARE_PROVIDER_SITE_OTHER): Payer: Medicare PPO | Admitting: Hematology and Oncology

## 2022-12-01 ENCOUNTER — Encounter: Payer: Self-pay | Admitting: Hematology and Oncology

## 2022-12-01 ENCOUNTER — Other Ambulatory Visit: Payer: Self-pay

## 2022-12-01 VITALS — BP 112/51 | HR 100 | Temp 97.7°F | Resp 18 | Ht 67.0 in | Wt 168.0 lb

## 2022-12-01 DIAGNOSIS — I110 Hypertensive heart disease with heart failure: Secondary | ICD-10-CM | POA: Insufficient documentation

## 2022-12-01 DIAGNOSIS — D509 Iron deficiency anemia, unspecified: Secondary | ICD-10-CM | POA: Insufficient documentation

## 2022-12-01 DIAGNOSIS — E119 Type 2 diabetes mellitus without complications: Secondary | ICD-10-CM | POA: Insufficient documentation

## 2022-12-01 DIAGNOSIS — D519 Vitamin B12 deficiency anemia, unspecified: Secondary | ICD-10-CM | POA: Diagnosis not present

## 2022-12-01 DIAGNOSIS — I509 Heart failure, unspecified: Secondary | ICD-10-CM | POA: Diagnosis not present

## 2022-12-01 DIAGNOSIS — J9611 Chronic respiratory failure with hypoxia: Secondary | ICD-10-CM | POA: Diagnosis not present

## 2022-12-01 DIAGNOSIS — C4339 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: Secondary | ICD-10-CM | POA: Diagnosis not present

## 2022-12-01 DIAGNOSIS — R188 Other ascites: Secondary | ICD-10-CM | POA: Diagnosis not present

## 2022-12-01 DIAGNOSIS — J90 Pleural effusion, not elsewhere classified: Secondary | ICD-10-CM | POA: Insufficient documentation

## 2022-12-01 DIAGNOSIS — I3139 Other pericardial effusion (noninflammatory): Secondary | ICD-10-CM | POA: Insufficient documentation

## 2022-12-01 DIAGNOSIS — I5040 Unspecified combined systolic (congestive) and diastolic (congestive) heart failure: Secondary | ICD-10-CM | POA: Diagnosis not present

## 2022-12-01 DIAGNOSIS — J449 Chronic obstructive pulmonary disease, unspecified: Secondary | ICD-10-CM | POA: Diagnosis not present

## 2022-12-01 DIAGNOSIS — Z79899 Other long term (current) drug therapy: Secondary | ICD-10-CM | POA: Insufficient documentation

## 2022-12-01 LAB — CBC WITH DIFFERENTIAL (CANCER CENTER ONLY)
Abs Immature Granulocytes: 0.02 10*3/uL (ref 0.00–0.07)
Basophils Absolute: 0.1 10*3/uL (ref 0.0–0.1)
Basophils Relative: 1 %
Eosinophils Absolute: 0 10*3/uL (ref 0.0–0.5)
Eosinophils Relative: 1 %
HCT: 34.7 % — ABNORMAL LOW (ref 39.0–52.0)
Hemoglobin: 10.5 g/dL — ABNORMAL LOW (ref 13.0–17.0)
Immature Granulocytes: 1 %
Lymphocytes Relative: 10 %
Lymphs Abs: 0.4 10*3/uL — ABNORMAL LOW (ref 0.7–4.0)
MCH: 31 pg (ref 26.0–34.0)
MCHC: 30.3 g/dL (ref 30.0–36.0)
MCV: 102.4 fL — ABNORMAL HIGH (ref 80.0–100.0)
Monocytes Absolute: 0.4 10*3/uL (ref 0.1–1.0)
Monocytes Relative: 9 %
Neutro Abs: 3.3 10*3/uL (ref 1.7–7.7)
Neutrophils Relative %: 78 %
Platelet Count: 120 10*3/uL — ABNORMAL LOW (ref 150–400)
RBC: 3.39 MIL/uL — ABNORMAL LOW (ref 4.22–5.81)
RDW: 15.9 % — ABNORMAL HIGH (ref 11.5–15.5)
WBC Count: 4.2 10*3/uL (ref 4.0–10.5)
nRBC: 0 % (ref 0.0–0.2)

## 2022-12-01 LAB — CMP (CANCER CENTER ONLY)
ALT: 16 U/L (ref 0–44)
AST: 14 U/L — ABNORMAL LOW (ref 15–41)
Albumin: 3.9 g/dL (ref 3.5–5.0)
Alkaline Phosphatase: 70 U/L (ref 38–126)
Anion gap: 7 (ref 5–15)
BUN: 38 mg/dL — ABNORMAL HIGH (ref 8–23)
CO2: 25 mmol/L (ref 22–32)
Calcium: 8.9 mg/dL (ref 8.9–10.3)
Chloride: 110 mmol/L (ref 98–111)
Creatinine: 1.69 mg/dL — ABNORMAL HIGH (ref 0.61–1.24)
GFR, Estimated: 41 mL/min — ABNORMAL LOW (ref >60)
Glucose, Bld: 142 mg/dL — ABNORMAL HIGH (ref 70–99)
Potassium: 4.7 mmol/L (ref 3.5–5.1)
Sodium: 142 mmol/L (ref 135–145)
Total Bilirubin: 0.6 mg/dL (ref 0.3–1.2)
Total Protein: 6.7 g/dL (ref 6.5–8.1)

## 2022-12-01 LAB — CBC AND DIFFERENTIAL

## 2022-12-01 LAB — CBC

## 2022-12-01 LAB — TSH: TSH: 4.72 u[IU]/mL — ABNORMAL HIGH (ref 0.350–4.500)

## 2022-12-01 LAB — MAGNESIUM: Magnesium: 1.7 mg/dL (ref 1.7–2.4)

## 2022-12-01 NOTE — Assessment & Plan Note (Signed)
He last received IV iron in July.  Iron studies in November were normal except for borderline low iron saturation.  Most likely, this represents anemia of chronic disease.

## 2022-12-01 NOTE — Assessment & Plan Note (Signed)
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  He had a hypermetabolic right submandibular node on PET, but no evidence of distant metastasis.  His original lesion was diagnosed in March 2016.  He underwent excision, but refused aggressive therapy.  He has been receiving palliative nivolumab since November 2016, with several interruptions. His treatment was on hold from December 2022 to April 2023 due to cardiorespiratory issues. He continues to tolerate nivolumab without difficulty.  CT imaging in August 2023 remained without evidence of metastatic disease.  However, there were increasing bilateral pleural effusions and small volume abdominal pelvic ascites, with stable pericardial effusion, felt to be most likely due to fluid overload.  He is on Entresto.    His last dose of nivolumab was in November.  He has not been in since then, as his daughter was unable to bring him.  We will proceed with another cycle of nivolumab this week.  We will plan to see him back in 4 weeks for repeat clinical assessment prior to his next nivolumab.

## 2022-12-01 NOTE — Assessment & Plan Note (Signed)
His last B12 injection was in November.

## 2022-12-01 NOTE — Progress Notes (Addendum)
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  He had a hypermetabolic right submandibular node on PET, but no evidence of distant metastasis.  His original lesion was diagnosed in March 2016.  He underwent excision, but refused aggressive therapy.  He has been receiving palliative nivolumab since November 2016, with several interruptions. His treatment was on hold from December 2022 to April 2023 due to cardiorespiratory issues. He continues to tolerate nivolumab without difficulty.  CT imaging in August 2023 remained without evidence of metastatic disease.  However, there were increasing bilateral pleural effusions and small volume abdominal pelvic ascites, with stable pericardial effusion, felt to be most likely due to fluid overload.  He is on Entresto.    His last dose of nivolumab was in November.  He has not been in since then, as his daughter was unable to bring him.  We will proceed with another cycle of nivolumab this week.  We will plan to see him back in 4 weeks for repeat clinical assessment prior to his next nivolumab.    B12 deficiency anemia His last B12 injection was in November.  Iron deficiency anemia He last received IV iron in July.  Iron studies in November were normal except for borderline low iron saturation.  Most likely, this represents anemia of chronic disease.   The patient understands the plans discussed today and is in agreement with them.  He knows to contact our office if he develops concerns prior to his next appointment.   I provided 15 minutes of face-to-face time during this encounter and > 50% was spent counseling as documented under my assessment and plan.    [name],  PA-C  [facility] AT [facility] [address] [place] [address] Dept: [phone] Dept Fax: [phone]   Orders Placed This Encounter  Procedures   CBC and differential    This external order was created through the Results Console.   CBC    This external order was created through the Results Console.   Magnesium    Standing Status:   Future    Number of Occurrences:   1    Standing Expiration Date:   12/02/2023      CHIEF COMPLAINT:  CC: Metastatic melanoma  Current Treatment: Nivolumab every 4 weeks   HISTORY OF PRESENT ILLNESS:  Kevin [name] is an 82 year old with 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  His original lesion was diagnosed in March 2016.  He underwent excision, but refused aggressive surgery.  The lesion was at least 4 mm thick with a Clark's level 4, and a mitotic index 10/mm.  The margins were positive, but wide reexcision revealed no residual melanoma.  He had recurrence within 2 months and the lesion was steadily growing.  He initially refused seeing a medical oncologist, but finally came to [place] for evaluation and treatment in October 2016.  PET revealed hypermetabolism in the right submandibular node, but no evidence of distant metastasis. He has been receiving palliative nivolumab since November 2016 and has had a good response with a decrease in the right facial tumor.  He has had associated hypomagnesemia and hypokalemia, so has continued on oral magnesium and potassium  supplements.     CT head/neck in May 2017 revealed some nonspecific skin thickening in the right face in the area of his melanoma in the right submandibular lymph node had decreased in size, now measuring 6 x 12 mm, but still prominent when compared to the left submandibular node.  He had 15 cycles of nivolumab and relocated to continue to get the same treatment.  He had his 30th dose in February 2018 and then returned to the  [place] area.      He returned to our care and nivolumab every 2 weeks was resumed in April 2018.  He had hospitalized in March 2018 with congestive heart failure.  Echocardiogram revealed severe diffuse hypokinesis and mild concentric hypertrophy with a ejection fraction of 20 to 25%.  CT chest was repeated in early May after 1 month back on therapy.  This was stable, without evidence of recurrent pleural effusions, immunotherapy toxicities, or evidence of metastatic disease.  He was switched to every 28-day nivolumab in August 2018. CT imaging in October 2018 revealed a stable 5 mm level 1B cervical lymph node, as well as a stable 8 mm exophytic lesion of the right face.    CT scans have remained stable, so he has continued nivolumab every 4 weeks.  He was given mirtazapine for depression and weight loss the dose was increased to 15 mg.  He was then lost to follow-up from December 2021 to April 2022, at which time he resumed nivolumab and B12 every 4 weeks.   CT head, neck and chest in June 2022 did not reveal any evidence of progressive disease.  He was admitted in November 2022, so nivolumab was placed on hold.  CT imaging in February 2023 did not reveal any evidence of metastatic disease.  There were changes in the lung concerning for atypical infection and Dr. [name] recommended bronchoscopy, but the patient did not go for that.  CT scans in August did not reveal obvious metastatic disease.  There was an increase in his bilateral pleural effusions, stable pericardial effusion and mild ascites, felt to most likely be due to fluid overload.  He is on Entresto and follows with cardiology.   His last dose of nivolumab was in November.  He has not been in since then, as his daughter was unable to bring him.    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 ([place])  01/24/2015 Cancer Staging   Staging form: Melanoma of the Skin, AJCC 8th Edition - Clinical stage from 01/24/2015: Stage IIB  (cT3b, cN0, cM0) - Signed by [name], MD on 08/19/2021 Histopathologic type: Malignant melanoma, NOS (except juvenile melanoma [id number]) Stage prefix: Initial diagnosis Laterality: Right Lymph-vascular invasion (LVI): LVI not present (absent)/not identified Diagnostic confirmation: Positive histology Specimen type: Excision Staged by: Managing physician Mitotic count: 10 Mitotic unit: mm2 Clark's level: Level IV Tumor-infiltrating lymphocytes: Unknown Neurotropism: Absent Presence of extranodal extension: Absent Breslow depth (mm): 40 Ulceration of the epidermis: Yes Microsatellites: No Primary tumor regression: Unknown Sentinel lymph node biopsy performed: No Matted nodes: No Prognostic indicators: Wide excision negative Stage used in treatment planning: Yes National guidelines used in treatment planning: Yes Type of national guideline used in treatment planning: NCCN   07/18/2020 - 06/18/2022 Chemotherapy   Patient is on Treatment Plan : MELANOMA Nivolumab + B12 q28d     07/18/2020 -  Chemotherapy   Patient is on Treatment Plan : MELANOMA Nivolumab (480) q28d     07/29/2020 Initial Diagnosis  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 [facility])       INTERVAL HISTORY:  Kevin [name] is here today for repeat clinical assessment prior to resuming nivolumab.  His last treatment was in November, as his daughter was unable to bring him for appointments.  He continues to report intermittent diarrhea, but not severe.  He denies cough or worsening shortness of breath.  He is on oxygen 3 L per nasal cannula at home.  He denies itching or rashes.  He denies fevers or chills. He denies pain. His appetite is good. His weight has been stable.  REVIEW OF SYSTEMS:  Review of Systems  Constitutional:  Negative for appetite change, chills, fatigue, fever and unexpected weight change.  HENT:   Negative for lump/mass, mouth sores and sore throat.   Respiratory:  Positive for shortness of breath.  Negative for cough.   Cardiovascular:  Negative for chest pain and leg swelling.  Gastrointestinal:  Positive for diarrhea. Negative for abdominal pain, constipation, nausea and vomiting.  Genitourinary:  Negative for difficulty urinating, dysuria, frequency and hematuria.   Musculoskeletal:  Negative for arthralgias, back pain and myalgias.  Skin:  Negative for itching, rash and wound.  Neurological:  Negative for dizziness, extremity weakness, headaches, light-headedness and numbness.  Hematological:  Negative for adenopathy.  Psychiatric/Behavioral:  Negative for depression and sleep disturbance. The patient is not nervous/anxious.      VITALS:  Blood pressure (!) 112/51, pulse 100, temperature 97.7 F (36.5 C), temperature source Oral, resp. rate 18, height '5\' 7"'$  (1.702 m), weight 168 lb (76.2 kg), SpO2 100 %.  Wt Readings from Last 3 Encounters:  12/01/22 168 lb (76.2 kg)  09/25/22 168 lb 0.6 oz (76.2 kg)  09/23/22 168 lb 6.4 oz (76.4 kg)    Body mass index is 26.31 kg/m.  Performance status (ECOG): 1 - Symptomatic but completely ambulatory  PHYSICAL EXAM:  Physical Exam Vitals and nursing note reviewed.  Constitutional:      General: He is not in acute distress.    Appearance: Normal appearance. He is normal weight.  HENT:     Head: Normocephalic and atraumatic.     Mouth/Throat:     Mouth: Mucous membranes are moist.     Pharynx: Oropharynx is clear. No oropharyngeal exudate or posterior oropharyngeal erythema.  Eyes:     General: No scleral icterus.    Extraocular Movements: Extraocular movements intact.     Conjunctiva/sclera: Conjunctivae normal.     Pupils: Pupils are equal, round, and reactive to light.  Cardiovascular:     Rate and Rhythm: Normal rate. Rhythm irregular.     Heart sounds: Normal heart sounds. No murmur heard.    No friction rub. No gallop.  Pulmonary:     Effort: Pulmonary effort is normal.     Breath sounds: Normal breath sounds. No  wheezing, rhonchi or rales.  Abdominal:     General: Bowel sounds are normal. There is no distension.     Palpations: Abdomen is soft. There is no mass.     Tenderness: There is abdominal tenderness (Around the umbilicus and area scar tissue from previous this incision).  Musculoskeletal:        General: Normal range of motion.     Cervical back: Normal range of motion and neck supple. No tenderness.     Right lower leg: Edema (1-2+) present.     Left lower leg: Edema (1-2+) present.  Lymphadenopathy:     Cervical: No cervical adenopathy.  Skin:  General: Skin is warm and dry.     Coloration: Skin is not jaundiced.     Findings: No rash.  Neurological:     Mental Status: He is alert and oriented to person, place, and time.     Cranial Nerves: No cranial nerve deficit.  Psychiatric:        Mood and Affect: Mood normal.        Behavior: Behavior normal.        Thought Content: Thought content normal.    LABS:      Latest Ref Rng & Units 12/01/2022    1:40 PM 12/01/2022   12:00 AM 09/23/2022    2:15 PM  CBC  WBC 4.0 - 10.5 K/uL 4.2   4.9   Hemoglobin 13.0 - 17.0 g/dL 10.5  --  C    10.2   Hematocrit 39.0 - 52.0 % 34.7  --  C    33.1   Platelets 150 - 400 K/uL 120   138     C Corrected result   This result is from an external source.      Latest Ref Rng & Units 12/01/2022    2:08 PM 09/23/2022    2:15 PM 08/27/2022   12:00 AM  CMP  Glucose 70 - 99 mg/dL 142  113    BUN 8 - 23 mg/dL 38  37  24      Creatinine 0.61 - 1.24 mg/dL 1.69  1.47  1.6      Sodium 135 - 145 mmol/L 142  144  140      Potassium 3.5 - 5.1 mmol/L 4.7  4.1  4.3      Chloride 98 - 111 mmol/L 110  110  107      CO2 22 - 32 mmol/L '25  27  30      '$ Calcium 8.9 - 10.3 mg/dL 8.9  9.2  9.0      Total Protein 6.5 - 8.1 g/dL 6.7  6.9    Total Bilirubin 0.3 - 1.2 mg/dL 0.6  0.7    Alkaline Phos 38 - 126 U/L 70  85  114      AST 15 - 41 U/L '14  18  28      '$ ALT 0 - 44 U/L 16  19  33         This result is  from an external source.     No results found for: "CEA1", "CEA" / No results found for: "CEA1", "CEA" No results found for: "PSA1" No results found for: "[id number]" No results found for: "CAN125"  No results found for: "TOTALPROTELP", "ALBUMINELP", "A1GS", "A2GS", "BETS", "BETA2SER", "GAMS", "MSPIKE", "SPEI" Lab Results  Component Value Date   TIBC 321 09/24/2022   TIBC 356 05/21/2022   FERRITIN 108 09/24/2022   FERRITIN 46 05/21/2022   IRONPCTSAT 16 (L) 09/24/2022   IRONPCTSAT 10 (L) 05/21/2022   Lab Results  Component Value Date   LDH 108 09/24/2022    STUDIES:  No results found.    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)    Thrombocytopenia ([place]) 07/29/2022    Past Surgical History:  Procedure Laterality Date   APPENDECTOMY     CARDIAC CATHETERIZATION     CORONARY ANGIOPLASTY     PACEMAKER INSERTION     RIGHT/LEFT HEART CATH AND CORONARY ANGIOGRAPHY N/A 10/09/2021   Procedure: RIGHT/LEFT HEART CATH AND CORONARY ANGIOGRAPHY;  Surgeon: Nigel Mormon, MD;  Location: [facility];  Service: Cardiovascular;  Laterality: N/A;    Family History  Problem Relation Age of Onset   Breast cancer Mother    Diabetes Mellitus II Father    Ovarian cancer Sister    Cancer Brother    Cancer Brother    Cancer Maternal Uncle    Breast cancer Niece     Social History:  reports that he has been smoking cigarettes. He has a 30.00 pack-year smoking history. He has never used smokeless tobacco. He reports that he  does not currently use alcohol. He reports that he does not currently use drugs.The patient is alone today.  Allergies: No Known Allergies  Current Medications: Current Outpatient Medications  Medication Sig Dispense Refill   metFORMIN (GLUCOPHAGE) 500 MG tablet Take 500 mg by mouth 2 (two) times daily.     albuterol (PROVENTIL) (2.5 MG/3ML) 0.083% nebulizer solution Take by nebulization.     albuterol (VENTOLIN HFA) 108 (90 Base) MCG/ACT inhaler Inhale into the lungs.     ALBUTEROL SULFATE PO Take by mouth.     amiodarone (PACERONE) 200 MG tablet Take 1 tablet (200 mg total) by mouth daily. 90 tablet 3   apixaban (ELIQUIS) 5 MG TABS tablet Take 1 tablet (5 mg total) by mouth 2 (two) times daily. 180 tablet 3   atorvastatin (LIPITOR) 80 MG tablet Take 1 tablet (80 mg total) by mouth at bedtime. 90 tablet 3   Fe Fum-FA-B Cmp-C-Zn-Mg-Mn-Cu (HEMOCYTE PLUS) 106-1 MG CAPS Take 1 capsule by mouth daily.     furosemide (LASIX) 40 MG tablet Take 1 tablet (40 mg total) by mouth every other day. 90 tablet 1   magnesium oxide (MAG-OX) 400 (240 Mg) MG tablet TAKE 1 TABLET BY MOUTH TWICE A DAY 180 tablet 1   metoprolol succinate (TOPROL-XL) 25 MG 24 hr tablet Take 0.5 tablets (12.5 mg total) by mouth daily. 45 tablet 3   ondansetron (ZOFRAN-ODT) 4 MG disintegrating tablet Take 1 tablet by mouth daily as needed.     potassium chloride SA (KLOR-CON M) 20 MEQ tablet Take 1 tablet (20 mEq total) by mouth daily. 90 tablet 3   SYMBICORT 80-4.5 MCG/ACT inhaler Inhale into the lungs.     tamsulosin (FLOMAX) 0.4 MG CAPS capsule Take by mouth. (Patient not taking: Reported on 08/14/2022)     TRUE METRIX BLOOD GLUCOSE TEST test strip SMARTSIG:Via Meter (Patient not taking: Reported on 12/01/2022)     TRUEplus Lancets 33G MISC  (Patient not taking: Reported on 12/01/2022)     No current facility-administered medications for this visit.

## 2022-12-02 ENCOUNTER — Encounter: Payer: Self-pay | Admitting: Oncology

## 2022-12-02 ENCOUNTER — Encounter: Payer: Self-pay | Admitting: Hematology and Oncology

## 2022-12-03 LAB — T4: T4, Total: 9 ug/dL (ref 4.5–12.0)

## 2022-12-03 MED FILL — Nivolumab IV Soln 100 MG/10ML: INTRAVENOUS | Qty: 48 | Status: AC

## 2022-12-03 NOTE — Progress Notes (Signed)
Ok to proceed with Nivolumab treatment on 12/04/22 with Scr 1.69 from 12/01/22.  Raul Del Slayden, [place], BCPS, BCOP 12/03/2022 10:37 AM

## 2022-12-04 ENCOUNTER — Inpatient Hospital Stay: Payer: Medicare PPO

## 2022-12-05 ENCOUNTER — Telehealth: Payer: Self-pay | Admitting: Oncology

## 2022-12-05 NOTE — Telephone Encounter (Signed)
12/04/22 Unable to reach patient to reschedule appt

## 2022-12-08 ENCOUNTER — Telehealth: Payer: Self-pay | Admitting: Oncology

## 2022-12-08 NOTE — Telephone Encounter (Signed)
12/08/22 LVM to rescheduled infusion appt

## 2022-12-09 ENCOUNTER — Other Ambulatory Visit: Payer: Medicare PPO

## 2022-12-09 DIAGNOSIS — Z515 Encounter for palliative care: Secondary | ICD-10-CM

## 2022-12-09 NOTE — Progress Notes (Signed)
PATIENT NAME: Kevin [name] DOB: 1940/11/25 MRN: [id number]  PRIMARY CARE PROVIDER: [name], PA-C  RESPONSIBLE PARTY:  Acct ID - Guarantor Home Phone Work Phone Relationship Acct Type  [id number] [name], [name]* [phone]  Self P/F     [place], [place], [place] [address]    I connected with daughter Kevin [name] for Kevin [name] on 12/09/22 by phone and verified that I am speaking with the correct person using two identifiers.   I discussed the limitations of evaluation and management by telemedicine. The patient expressed understanding and agreed to proceed.   Connected with daughter Kevin [name] by phone to complete a telephonic visit.  Daughter advised patient has been doing well.  Appetite remains good.  No issues with pain.  Daughter advised she has no concerns at this time. They have an infusion appt on Thursday with oncology.  We discussed PC services being telephonic as patient is out of the service area.  Daughter advised it was okay to continue with telephonic visits.  PC will follow up with patient next month.  Lorenza Burton, RN

## 2022-12-10 ENCOUNTER — Other Ambulatory Visit: Payer: Self-pay | Admitting: Pharmacist

## 2022-12-10 DIAGNOSIS — C4339 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: Secondary | ICD-10-CM

## 2022-12-10 MED FILL — Nivolumab IV Soln 240 MG/24ML: INTRAVENOUS | Qty: 48 | Status: AC

## 2022-12-11 ENCOUNTER — Inpatient Hospital Stay: Payer: Medicare PPO | Attending: Oncology

## 2022-12-11 VITALS — BP 109/58 | HR 90 | Temp 98.1°F | Resp 20 | Ht 67.0 in | Wt 160.1 lb

## 2022-12-11 DIAGNOSIS — D509 Iron deficiency anemia, unspecified: Secondary | ICD-10-CM | POA: Diagnosis not present

## 2022-12-11 DIAGNOSIS — D519 Vitamin B12 deficiency anemia, unspecified: Secondary | ICD-10-CM | POA: Insufficient documentation

## 2022-12-11 DIAGNOSIS — Z5112 Encounter for antineoplastic immunotherapy: Secondary | ICD-10-CM | POA: Insufficient documentation

## 2022-12-11 DIAGNOSIS — C4339 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: Secondary | ICD-10-CM | POA: Insufficient documentation

## 2022-12-11 MED ORDER — CYANOCOBALAMIN 1000 MCG/ML IJ SOLN
1000.0000 ug | Freq: Once | INTRAMUSCULAR | Status: AC
  Administered 2022-12-11: 1000 ug via INTRAMUSCULAR
  Filled 2022-12-11: qty 1

## 2022-12-11 MED ORDER — SODIUM CHLORIDE 0.9% FLUSH
10.0000 mL | INTRAVENOUS | Status: DC | PRN
  Administered 2022-12-11: 10 mL

## 2022-12-11 MED ORDER — SODIUM CHLORIDE 0.9 % IV SOLN
480.0000 mg | Freq: Once | INTRAVENOUS | Status: AC
  Administered 2022-12-11: 480 mg via INTRAVENOUS
  Filled 2022-12-11 (×2): qty 48

## 2022-12-11 MED ORDER — HEPARIN SOD (PORK) LOCK FLUSH 100 UNIT/ML IV SOLN
500.0000 [IU] | Freq: Once | INTRAVENOUS | Status: AC | PRN
  Administered 2022-12-11: 500 [IU]

## 2022-12-11 MED ORDER — SODIUM CHLORIDE 0.9 % IV SOLN: Freq: Once | INTRAVENOUS | Status: AC

## 2022-12-11 NOTE — Patient Instructions (Addendum)
Vitamin B12 Injection What is this medication? Vitamin B12 (VAHY tuh min B12) prevents and treats low vitamin B12 levels in your body. It is used in people who do not get enough vitamin B12 from their diet or when their digestive tract does not absorb enough. Vitamin B12 plays an important role in maintaining the health of your nervous system and red blood cells. This medicine may be used for other purposes; ask your health care provider or pharmacist if you have questions. COMMON BRAND NAME(S): B-12 Compliance Kit, B-12 Injection Kit, Cyomin, Dodex, LA-12, Nutri-Twelve, Physicians EZ Use B-12, Primabalt What should I tell my care team before I take this medication? They need to know if you have any of these conditions: Kidney disease Leber's disease Megaloblastic anemia An unusual or allergic reaction to cyanocobalamin, cobalt, other medications, foods, dyes, or preservatives Pregnant or trying to get pregnant Breast-feeding How should I use this medication? This medication is injected into a muscle or deeply under the skin. It is usually given in a clinic or care team's office. However, your care team may teach you how to inject yourself. Follow all instructions. Talk to your care team about the use of this medication in children. Special care may be needed. Overdosage: If you think you have taken too much of this medicine contact a poison control center or emergency room at once. NOTE: This medicine is only for you. Do not share this medicine with others. What if I miss a dose? If you are given your dose at a clinic or care team's office, call to reschedule your appointment. If you give your own injections, and you miss a dose, take it as soon as you can. If it is almost time for your next dose, take only that dose. Do not take double or extra doses. What may interact with this medication? Alcohol Colchicine This list may not describe all possible interactions. Give your health care  provider a list of all the medicines, herbs, non-prescription drugs, or dietary supplements you use. Also tell them if you smoke, drink alcohol, or use illegal drugs. Some items may interact with your medicine. What should I watch for while using this medication? Visit your care team regularly. You may need blood work done while you are taking this medication. You may need to follow a special diet. Talk to your care team. Limit your alcohol intake and avoid smoking to get the best benefit. What side effects may I notice from receiving this medication? Side effects that you should report to your care team as soon as possible: Allergic reactions--skin rash, itching, hives, swelling of the face, lips, tongue, or throat Swelling of the ankles, hands, or feet Trouble breathing Side effects that usually do not require medical attention (report to your care team if they continue or are bothersome): Diarrhea This list may not describe all possible side effects. Call your doctor for medical advice about side effects. You may report side effects to FDA at 1-800-FDA-1088. Where should I keep my medication? Keep out of the reach of children. Store at room temperature between 15 and 30 degrees C (59 and 85 degrees F). Protect from light. Throw away any unused medication after the expiration date. NOTE: This sheet is a summary. It may not cover all possible information. If you have questions about this medicine, talk to your doctor, pharmacist, or health care provider.  2023 Elsevier/Gold Standard (2007-12-18 00:00:00) Nivolumab Injection What is this medication? NIVOLUMAB (nye VOL ue mab) treats some types  of cancer. It works by helping your immune system slow or stop the spread of cancer cells. It is a monoclonal antibody. This medicine may be used for other purposes; ask your health care provider or pharmacist if you have questions. COMMON BRAND NAME(S): Opdivo What should I tell my care team before I  take this medication? They need to know if you have any of these conditions: Allogeneic stem cell transplant (uses someone else's stem cells) Autoimmune diseases, such as Crohn disease, ulcerative colitis, lupus History of chest radiation Nervous system problems, such as Guillain-Barre syndrome or myasthenia gravis Organ transplant An unusual or allergic reaction to nivolumab, other medications, foods, dyes, or preservatives Pregnant or trying to get pregnant Breast-feeding How should I use this medication? This medication is infused into a vein. It is given in a hospital or clinic setting. A special MedGuide will be given to you before each treatment. Be sure to read this information carefully each time. Talk to your care team about the use of this medication in children. While it may be prescribed for children as young as 12 years for selected conditions, precautions do apply. Overdosage: If you think you have taken too much of this medicine contact a poison control center or emergency room at once. NOTE: This medicine is only for you. Do not share this medicine with others. What if I miss a dose? Keep appointments for follow-up doses. It is important not to miss your dose. Call your care team if you are unable to keep an appointment. What may interact with this medication? Interactions have not been studied. This list may not describe all possible interactions. Give your health care provider a list of all the medicines, herbs, non-prescription drugs, or dietary supplements you use. Also tell them if you smoke, drink alcohol, or use illegal drugs. Some items may interact with your medicine. What should I watch for while using this medication? Your condition will be monitored carefully while you are receiving this medication. You may need blood work while taking this medication. This medication may cause serious skin reactions. They can happen weeks to months after starting the medication.  Contact your care team right away if you notice fevers or flu-like symptoms with a rash. The rash may be red or purple and then turn into blisters or peeling of the skin. You may also notice a red rash with swelling of the face, lips, or lymph nodes in your neck or under your arms. Tell your care team right away if you have any change in your eyesight. Talk to your care team if you are pregnant or think you might be pregnant. A negative pregnancy test is required before starting this medication. A reliable form of contraception is recommended while taking this medication and for 5 months after the last dose. Talk to your care team about effective forms of contraception. Do not breast-feed while taking this medication and for 5 months after the last dose. What side effects may I notice from receiving this medication? Side effects that you should report to your care team as soon as possible: Allergic reactions--skin rash, itching, hives, swelling of the face, lips, tongue, or throat Dry cough, shortness of breath or trouble breathing Eye pain, redness, irritation, or discharge with blurry or decreased vision Heart muscle inflammation--unusual weakness or fatigue, shortness of breath, chest pain, fast or irregular heartbeat, dizziness, swelling of the ankles, feet, or hands Hormone gland problems--headache, sensitivity to light, unusual weakness or fatigue, dizziness, fast or irregular  heartbeat, increased sensitivity to cold or heat, excessive sweating, constipation, hair loss, increased thirst or amount of urine, tremors or shaking, irritability Infusion reactions--chest pain, shortness of breath or trouble breathing, feeling faint or lightheaded Kidney injury (glomerulonephritis)--decrease in the amount of urine, red or dark brown urine, foamy or bubbly urine, swelling of the ankles, hands, or feet Liver injury--right upper belly pain, loss of appetite, nausea, light-colored stool, dark yellow or brown  urine, yellowing skin or eyes, unusual weakness or fatigue Pain, tingling, or numbness in the hands or feet, muscle weakness, change in vision, confusion or trouble speaking, loss of balance or coordination, trouble walking, seizures Rash, fever, and swollen lymph nodes Redness, blistering, peeling, or loosening of the skin, including inside the mouth Sudden or severe stomach pain, bloody diarrhea, fever, nausea, vomiting Side effects that usually do not require medical attention (report these to your care team if they continue or are bothersome): Bone, joint, or muscle pain Diarrhea Fatigue Loss of appetite Nausea Skin rash This list may not describe all possible side effects. Call your doctor for medical advice about side effects. You may report side effects to FDA at 1-800-FDA-1088. Where should I keep my medication? This medication is given in a hospital or clinic. It will not be stored at home. NOTE: This sheet is a summary. It may not cover all possible information. If you have questions about this medicine, talk to your doctor, pharmacist, or health care provider.  2023 Elsevier/Gold Standard (2022-02-24 00:00:00)

## 2022-12-16 DIAGNOSIS — I5021 Acute systolic (congestive) heart failure: Secondary | ICD-10-CM | POA: Diagnosis not present

## 2022-12-19 ENCOUNTER — Other Ambulatory Visit: Payer: Self-pay | Admitting: Hematology and Oncology

## 2022-12-19 ENCOUNTER — Other Ambulatory Visit: Payer: Self-pay | Admitting: Oncology

## 2022-12-19 DIAGNOSIS — C4339 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: Secondary | ICD-10-CM

## 2022-12-22 ENCOUNTER — Other Ambulatory Visit: Payer: Medicare PPO

## 2022-12-22 ENCOUNTER — Ambulatory Visit: Payer: Medicare PPO | Admitting: Oncology

## 2022-12-23 ENCOUNTER — Telehealth: Payer: Self-pay | Admitting: Oncology

## 2022-12-23 NOTE — Telephone Encounter (Signed)
12/23/22 Several msgs left to confirm ct scans on 12/26/22.Arrive at [facility]

## 2022-12-25 ENCOUNTER — Ambulatory Visit: Payer: Medicare PPO

## 2022-12-25 DIAGNOSIS — J9811 Atelectasis: Secondary | ICD-10-CM | POA: Diagnosis not present

## 2022-12-25 DIAGNOSIS — N281 Cyst of kidney, acquired: Secondary | ICD-10-CM | POA: Diagnosis not present

## 2022-12-25 DIAGNOSIS — J90 Pleural effusion, not elsewhere classified: Secondary | ICD-10-CM | POA: Diagnosis not present

## 2022-12-25 DIAGNOSIS — K8020 Calculus of gallbladder without cholecystitis without obstruction: Secondary | ICD-10-CM | POA: Diagnosis not present

## 2022-12-25 DIAGNOSIS — J929 Pleural plaque without asbestos: Secondary | ICD-10-CM | POA: Diagnosis not present

## 2022-12-25 DIAGNOSIS — J811 Chronic pulmonary edema: Secondary | ICD-10-CM | POA: Diagnosis not present

## 2022-12-25 DIAGNOSIS — R188 Other ascites: Secondary | ICD-10-CM | POA: Diagnosis not present

## 2022-12-25 DIAGNOSIS — R220 Localized swelling, mass and lump, head: Secondary | ICD-10-CM | POA: Diagnosis not present

## 2022-12-25 DIAGNOSIS — C4339 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: Secondary | ICD-10-CM | POA: Diagnosis not present

## 2022-12-25 DIAGNOSIS — K839 Disease of biliary tract, unspecified: Secondary | ICD-10-CM | POA: Diagnosis not present

## 2022-12-25 LAB — HEPATIC FUNCTION PANEL
ALT: 17 U/L (ref 10–40)
AST: 20 (ref 14–40)
Alkaline Phosphatase: 76 (ref 25–125)

## 2022-12-25 LAB — CBC AND DIFFERENTIAL
HCT: 32 — AB (ref 41–53)
Hemoglobin: 10.5 — AB (ref 13.5–17.5)
Neutrophils Absolute: 3.5
Platelets: 127 10*3/uL — AB (ref 150–400)
WBC: 4.6

## 2022-12-25 LAB — COMPREHENSIVE METABOLIC PANEL
Albumin: 4.1 (ref 3.5–5.0)
Calcium: 9.2 (ref 8.7–10.7)

## 2022-12-25 LAB — BASIC METABOLIC PANEL
BUN: 31 — AB (ref 4–21)
CO2: 24 — AB (ref 13–22)
Chloride: 109 — AB (ref 99–108)
Creatinine: 1.5 — AB (ref 0.6–1.3)
Glucose: 97
Potassium: 4.7 mEq/L (ref 3.5–5.1)
Sodium: 141 (ref 137–147)

## 2022-12-25 LAB — CBC: RBC: 3.35 — AB (ref 3.87–5.11)

## 2022-12-31 ENCOUNTER — Encounter: Payer: Self-pay | Admitting: Oncology

## 2023-01-01 DIAGNOSIS — J449 Chronic obstructive pulmonary disease, unspecified: Secondary | ICD-10-CM | POA: Diagnosis not present

## 2023-01-01 DIAGNOSIS — I5040 Unspecified combined systolic (congestive) and diastolic (congestive) heart failure: Secondary | ICD-10-CM | POA: Diagnosis not present

## 2023-01-01 DIAGNOSIS — J9611 Chronic respiratory failure with hypoxia: Secondary | ICD-10-CM | POA: Diagnosis not present

## 2023-01-02 NOTE — Progress Notes (Incomplete)
[facility]  [address] 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  He had a hypermetabolic right submandibular node on PET, but no evidence of distant metastasis.  His original lesion was diagnosed in March 2016.  He underwent excision, but refused aggressive therapy.  He has been receiving palliative nivolumab since November 2016, with several interruptions. His treatment was on hold from December 2022 to April 2023 due to cardiorespiratory issues. He continues to tolerate nivolumab without difficulty.  CT imaging in August 2023 remains without evidence of metastatic disease.  However, there were increasing bilateral pleural effusions and small volume abdominal pelvic ascites, with stable pericardial effusion, felt to be most likely due to fluid overload.  He is on Entresto.   B12 deficiency anemia He had missed several B12 injections, but these were resumed and he has continued them monthly.  His hemoglobin had improved with the B12 injections, but then he was found to have be iron deficient.  His B12 level last month was good.   Iron deficiency anemia He has had chronic anemia, but in July was found to have iron deficiency, so was treated with IV iron.  His hemoglobin improved somewhat since receiving the iron but now is back to 10.1.   Thrombocytopenia ([place]) He is new mild thrombocytopenia of uncertain etiology.  This may be related to his treatment.  This has nearly resolved now.    He will continue monthly infusions of nivolumab.  We will plan to see him back in 4 weeks with a CBC and comprehensive metabolic panel prior to his next treatment.  The patient understands the plans discussed today and is in agreement with them.  He  knows to contact our office if he develops concerns prior to his next appointment.     [facility] [address] [place] [address] Dept: [phone] Dept Fax: [phone]   No orders of the defined types were placed in this encounter.     CHIEF COMPLAINT:  CC: Recurrent melanoma  Current Treatment: Palliative nivolumab every 4 weeks  HISTORY OF PRESENT ILLNESS:  Cortavius Pent is an 82 year old with 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  His original lesion was diagnosed in March 2016.  He underwent excision, but refused aggressive surgery.  The lesion was at least 4 mm thick with a Clark's level 4, and a mitotic index 10/mm.  The margins were positive, but wide reexcision revealed no residual melanoma.  He had recurrence within 2 months and the lesion had been steadily growing.  He initially had refused seeing a medical oncologist, but finally came to [place] for evaluation and treatment in October 2016.  He does have extensive comorbidities including COPD, diabetes, hypertension, coronary artery disease, history of myocardial infarction, and pacemaker with AICD.  PET revealed hypermetabolism in the right submandibular node, but no evidence of distant metastasis.  He was having significant pain and requiring regular hydrocodone doses.  He also had severe disfigurement and avoided going out in public because of the facial lesion.  He has been receiving palliative nivolumab since November 2016 and has had a good response with a decrease in the right facial tumor.  He has  had associated hypomagnesemia and hypokalemia, so is on oral magnesium and potassium supplements. He has had mild chronic anemia, which has been stable. He was found to have B12 deficiency and continues B12 injections monthly.  CT head/neck in May 2017 revealed some nonspecific skin thickening in the right face in the area of his  melanoma in the right submandibular lymph node had decreased in size, now measuring 6 x 12 mm, but still prominent when compared to the left submandibular node.  He had 15 cycles of nivolumab and relocated to continue to get the same treatment.  He had his 30th dose in February 2018 and then returned to the [place] area.     He was admitted to the hospital in early March 2018 for congestive heart failure associated with pleural and pericardial effusions.  He had no leukocytosis, fever or purulent sputum.  The echocardiogram revealed severe diffuse hypokinesis with mild concentric hypertrophy and markedly depressed ejection fraction of 20 to 25%, associated with moderate mitral regurgitation and moderate left atrial dilatation.  He improved with diuresis.  CT chest in March 2018 revealed stable cardiomegaly and stable small pericardial effusion, but no evidence of immune mediated pneumonitis or metastatic disease.  The previously seen bilateral pleural effusions had resolved.  He returned to our care and nivolumab every 2 weeks was resumed in April.  CT chest was repeated in early May after 1 month back on therapy.  This was stable, without evidence of recurrent pleural effusions, immunotherapy toxicities, or evidence of metastatic disease.  He was switched to every 28-day nivolumab in August 2018.  He has had stable disease so has continued on nivolumab.  CT imaging in October 2018 revealed a stable 5 mm level 1B cervical lymph node, as well as a stable 8 mm exophytic lesion of the right face.  CT chest did not reveal any evidence of metastatic disease. Borderline cardiomegaly and a small amount of pericardial fluid/thickening was seen, but not reaccumulation of the pleural effusions.  CT head did not reveal any evidence of intracranial metastasis.  There were chronic ischemic changes and evidence of old infarcts.   Unfortunately, his wife passed away in 06-14-2018 and she was his primary caregiver His  daughters have been caring for him since then.  CT scans have remained stable, so he has continued nivolumab every 4 weeks.  He was given mirtazapine for depression and weight loss the dose was increased to 15 mg.  He was then lost to follow-up from December 2021 to April 2022, at which time he resumed nivolumab and B12 every 4 weeks.   CT head, neck and chest in June 2022 did not reveal any evidence of progressive disease.  He was admitted at [facility] in November/December 2022 due to flu and pneumonia.  Nivolumab was placed on hold in December.  CT chest, abdomen and pelvis in February did not reveal any evidence of metastatic disease.  There was new extensive tree in bud nodularity in the right lung with most significant involvement of the right lower lobe. Findings were most consistent with an infectious or inflammatory process in a pattern suggestive of atypical infection including nontuberculous mycobacterium.  He was seen by Dr. [name], who recommended bronchoscopy, but the patient did not return for follow-up as he did not wish to pursue this procedure.  Nivolumab was resumed again in April.  He was last CT scans in August did not reveal obvious metastatic disease.  There was an increase in his bilateral  pleural effusions, stable pericardial effusion and mild ascites, felt to most likely be due to fluid overload.  He is on Entresto and follows with cardiology.   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 ([place])  01/24/2015 Cancer Staging   Staging form: Melanoma of the Skin, AJCC 8th Edition - Clinical stage from 01/24/2015: Stage IIB (cT3b, cN0, cM0) - Signed by [name], MD on 08/19/2021 Histopathologic type: Malignant melanoma, NOS (except juvenile melanoma [id number]) Stage prefix: Initial diagnosis Laterality: Right Lymph-vascular invasion (LVI): LVI not present (absent)/not identified Diagnostic confirmation: Positive histology Specimen type: Excision Staged by:  Managing physician Mitotic count: 10 Mitotic unit: mm2 Clark's level: Level IV Tumor-infiltrating lymphocytes: Unknown Neurotropism: Absent Presence of extranodal extension: Absent Breslow depth (mm): 40 Ulceration of the epidermis: Yes Microsatellites: No Primary tumor regression: Unknown Sentinel lymph node biopsy performed: No Matted nodes: No Prognostic indicators: Wide excision negative Stage used in treatment planning: Yes National guidelines used in treatment planning: Yes Type of national guideline used in treatment planning: NCCN   07/18/2020 - 06/18/2022 Chemotherapy   Patient is on Treatment Plan : MELANOMA Nivolumab + B12 q28d     07/18/2020 -  Chemotherapy   Patient is on Treatment Plan : MELANOMA Nivolumab (480) q28d     07/29/2020 Initial Diagnosis   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 ([place])       INTERVAL HISTORY:  Alcide is here today for repeat clinical assessment for recurrent melanoma.      He states he does uses oxygen at night as needed.  He reports this persistent lower extremity edema, which improves overnight.    The cardiologist has been working on diuresis and his BUN is 24 with a creatinine of 1.6.  The rest of his CMP is unremarkable other than a nonfasting blood sugar of 185.  His hemoglobin has dropped from 10.3 to 10.1 but his platelet count came up to 130,000 from 113,000.    He has intermittent diarrhea not requiring medication.    He denies itching or skin rash.  He denies abnormal bleeding.  He denies melena or hematochezia.  He denies fevers or chills. He denies pain. His appetite is good. His weight has decreased 2 pounds over last 6 weeks .  He ambulates with a cane.  He states he is up during the day and walks outside as much as he can.  REVIEW OF SYSTEMS:  Review of Systems  Constitutional: Negative.  Negative for appetite change, chills, diaphoresis, fatigue, fever and unexpected weight change.  HENT:  Negative.  Negative for  hearing loss, lump/mass, mouth sores, nosebleeds, sore throat, tinnitus, trouble swallowing and voice change.   Eyes: Negative.  Negative for eye problems and icterus.  Respiratory:  Positive for shortness of breath (Stable). Negative for chest tightness, cough, hemoptysis and wheezing.   Cardiovascular:  Negative for chest pain, leg swelling and palpitations.  Gastrointestinal: Negative.  Negative for abdominal distention, abdominal pain, blood in stool, constipation, diarrhea (Intermittent, not severe), nausea, rectal pain and vomiting.  Endocrine: Negative.   Genitourinary: Negative.  Negative for bladder incontinence, difficulty urinating, dyspareunia, dysuria, frequency, hematuria, nocturia, pelvic pain and penile discharge.   Musculoskeletal: Negative.  Negative for arthralgias, back pain, flank pain, gait problem, myalgias, neck pain and neck stiffness.  Skin: Negative.  Negative for itching, rash and wound.  Neurological: Negative.  Negative for dizziness, extremity weakness, gait problem, headaches, light-headedness, numbness, seizures and speech difficulty.  Hematological:  Negative for adenopathy.  Bruises/bleeds easily.  Psychiatric/Behavioral:  Positive for confusion. Negative for decreased concentration, depression, sleep disturbance and suicidal ideas. The patient is not nervous/anxious.      VITALS:  There were no vitals taken for this visit.  Wt Readings from Last 3 Encounters:  12/11/22 160 lb 1.3 oz (72.6 kg)  12/01/22 168 lb (76.2 kg)  09/25/22 168 lb 0.6 oz (76.2 kg)    There is no height or weight on file to calculate BMI.  Performance status (ECOG): 1 - Symptomatic but completely ambulatory  PHYSICAL EXAM:  Physical Exam Vitals and nursing note reviewed.  Constitutional:      General: He is not in acute distress.    Appearance: Normal appearance. He is normal weight. He is not ill-appearing, toxic-appearing or diaphoretic.  HENT:     Head: Normocephalic and  atraumatic.     Right Ear: Tympanic membrane, ear canal and external ear normal. There is no impacted cerumen.     Left Ear: Tympanic membrane, ear canal and external ear normal. There is no impacted cerumen.     Nose: Nose normal. No congestion or rhinorrhea.     Mouth/Throat:     Mouth: Mucous membranes are moist.     Pharynx: Oropharynx is clear. No oropharyngeal exudate or posterior oropharyngeal erythema.  Eyes:     General: No scleral icterus.       Right eye: No discharge.        Left eye: No discharge.     Extraocular Movements: Extraocular movements intact.     Conjunctiva/sclera: Conjunctivae normal.     Pupils: Pupils are equal, round, and reactive to light.  Neck:     Vascular: No carotid bruit.  Cardiovascular:     Rate and Rhythm: Normal rate and regular rhythm.     Pulses: Normal pulses.     Heart sounds: Normal heart sounds. No murmur heard.    No friction rub. No gallop.  Pulmonary:     Effort: Pulmonary effort is normal. No respiratory distress.     Breath sounds: No stridor. Examination of the right-lower field reveals decreased breath sounds and rales. Examination of the left-lower field reveals decreased breath sounds and rales. Decreased breath sounds and rales present. No wheezing or rhonchi.  Chest:     Chest wall: No tenderness.  Abdominal:     General: Bowel sounds are normal. There is no distension.     Palpations: Abdomen is soft. There is no fluid wave, hepatomegaly, splenomegaly or mass.     Tenderness: There is no abdominal tenderness. There is no right CVA tenderness, left CVA tenderness, guarding or rebound.     Hernia: No hernia is present.     Comments: Mild edema of the lower abdomen  Musculoskeletal:        General: No swelling, tenderness, deformity or signs of injury. Normal range of motion.     Cervical back: Normal range of motion and neck supple. No rigidity or tenderness.     Right lower leg: Edema (2+ to the knees) present.     Left  lower leg: Edema (2+ to the knees) present.  Lymphadenopathy:     Cervical: No cervical adenopathy.  Skin:    General: Skin is warm and dry.     Coloration: Skin is not jaundiced or pale.     Findings: No bruising, erythema, lesion or rash.  Neurological:     General: No focal deficit present.     Mental Status: He is alert  and oriented to person, place, and time. Mental status is at baseline.     Cranial Nerves: No cranial nerve deficit.     Sensory: No sensory deficit.     Motor: No weakness.     Coordination: Coordination normal.     Gait: Gait normal.     Deep Tendon Reflexes: Reflexes normal.  Psychiatric:        Mood and Affect: Mood normal.        Behavior: Behavior normal.        Thought Content: Thought content normal.        Judgment: Judgment normal.    LABS:      Latest Ref Rng & Units 12/25/2022   12:00 AM 12/01/2022    1:40 PM 12/01/2022   12:00 AM  CBC  WBC  4.6     4.2    Hemoglobin 13.5 - 17.5 10.5     10.5  --  C     Hematocrit 41 - 53 32     34.7  --  C     Platelets 150 - 400 K/uL 127     120      C Corrected result   This result is from an external source.       Latest Ref Rng & Units 12/25/2022   12:00 AM 12/01/2022    2:08 PM 09/23/2022    2:15 PM  CMP  Glucose 70 - 99 mg/dL  142  113   BUN 4 - 21 31     38  37   Creatinine 0.6 - 1.3 1.5     1.69  1.47   Sodium 137 - 147 141     142  144   Potassium 3.5 - 5.1 mEq/L 4.7     4.7  4.1   Chloride 99 - 108 109     110  110   CO2 13 - '22 24     25  27   '$ Calcium 8.7 - 10.7 9.2     8.9  9.2   Total Protein 6.5 - 8.1 g/dL  6.7  6.9   Total Bilirubin 0.3 - 1.2 mg/dL  0.6  0.7   Alkaline Phos 25 - 125 76     70  85   AST 14 - 40 '20     14  18   '$ ALT 10 - 40 U/L '17     16  19      '$ This result is from an external source.    No results found for: "CEA1", "CEA" / No results found for: "CEA1", "CEA" No results found for: "PSA1" No results found for: "[id number]" No results found for: "CAN125"  No  results found for: "TOTALPROTELP", "ALBUMINELP", "A1GS", "A2GS", "BETS", "BETA2SER", "GAMS", "MSPIKE", "SPEI" Lab Results  Component Value Date   TIBC 321 09/24/2022   TIBC 356 05/21/2022   FERRITIN 108 09/24/2022   FERRITIN 46 05/21/2022   IRONPCTSAT 16 (L) 09/24/2022   IRONPCTSAT 10 (L) 05/21/2022   Lab Results  Component Value Date   LDH 108 09/24/2022   Component Ref Range & Units 1 mo ago (12/01/22) 6 mo ago (06/16/22)  Magnesium 1.7 - 2.4 mg/dL 1.7 1.80 R     Component Ref Range & Units 1 mo ago (12/01/22) 3 mo ago 4 mo ago 7 mo ago  T4, Total 4.5 - 12.0 ug/dL 9.0 8.8 CM 9.3 CM 9.1 CM     Component Ref Range & Units 1  mo ago (12/01/22) 3 mo ago 4 mo ago 6 mo ago  TSH 0.350 - 4.500 uIU/mL 4.720 High  4.432 CM 4.084 CM 3.881 CM      STUDIES:  No results found.        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)    Thrombocytopenia ([place]) 07/29/2022    Past Surgical History:  Procedure Laterality Date   APPENDECTOMY     CARDIAC CATHETERIZATION     CORONARY ANGIOPLASTY     PACEMAKER INSERTION     RIGHT/LEFT HEART CATH AND CORONARY ANGIOGRAPHY N/A 10/09/2021   Procedure: RIGHT/LEFT HEART CATH AND CORONARY ANGIOGRAPHY;  Surgeon: Nigel Mormon, MD;  Location: [facility];  Service: Cardiovascular;  Laterality: N/A;    Family History  Problem Relation Age of Onset   Breast cancer Mother    Diabetes Mellitus II Father    Ovarian cancer Sister    Cancer Brother    Cancer Brother    Cancer  Maternal Uncle    Breast cancer Niece     Social History:  reports that he has been smoking cigarettes. He has a 30.00 pack-year smoking history. He has never used smokeless tobacco. He reports that he does not currently use alcohol. He reports that he does not currently use drugs.The patient is alone today.  Allergies: No Known Allergies  Current Medications: Current Outpatient Medications  Medication Sig Dispense Refill   albuterol (PROVENTIL) (2.5 MG/3ML) 0.083% nebulizer solution Take by nebulization.     albuterol (VENTOLIN HFA) 108 (90 Base) MCG/ACT inhaler Inhale into the lungs.     ALBUTEROL SULFATE PO Take by mouth.     amiodarone (PACERONE) 200 MG tablet Take 1 tablet (200 mg total) by mouth daily. 90 tablet 3   apixaban (ELIQUIS) 5 MG TABS tablet Take 1 tablet (5 mg total) by mouth 2 (two) times daily. 180 tablet 3   atorvastatin (LIPITOR) 80 MG tablet Take 1 tablet (80 mg total) by mouth at bedtime. 90 tablet 3   Fe Fum-FA-B Cmp-C-Zn-Mg-Mn-Cu (HEMOCYTE PLUS) 106-1 MG CAPS Take 1 capsule by mouth daily.     furosemide (LASIX) 40 MG tablet Take 1 tablet (40 mg total) by mouth every other day. 90 tablet 1   magnesium oxide (MAG-OX) 400 (240 Mg) MG tablet TAKE 1 TABLET BY MOUTH TWICE A DAY 180 tablet 1   metFORMIN (GLUCOPHAGE) 500 MG tablet Take 500 mg by mouth 2 (two) times daily.     metoprolol succinate (TOPROL-XL) 25 MG 24 hr tablet Take 0.5 tablets (12.5 mg total) by mouth daily. 45 tablet 3   ondansetron (ZOFRAN-ODT) 4 MG disintegrating tablet Take 1 tablet by mouth daily as needed.     potassium chloride SA (KLOR-CON M) 20 MEQ tablet Take 1 tablet (20 mEq total) by mouth daily. 90 tablet 3   SYMBICORT 80-4.5 MCG/ACT inhaler Inhale into the lungs.     tamsulosin (FLOMAX) 0.4 MG CAPS capsule Take by mouth. (Patient not  taking: Reported on 08/14/2022)     TRUE METRIX BLOOD GLUCOSE TEST test strip SMARTSIG:Via Meter (Patient not taking: Reported on 12/01/2022)     TRUEplus Lancets  33G MISC  (Patient not taking: Reported on 12/01/2022)     No current facility-administered medications for this visit.     I,Jasmine M Lassiter,acting as a scribe for [name], MD.,have documented all relevant documentation on the behalf of [name], MD,as directed by  [name], MD while in the presence of [name], MD.

## 2023-01-04 ENCOUNTER — Encounter: Payer: Self-pay | Admitting: Oncology

## 2023-01-05 ENCOUNTER — Ambulatory Visit: Payer: Medicare PPO | Admitting: Oncology

## 2023-01-05 ENCOUNTER — Inpatient Hospital Stay: Payer: Medicare PPO

## 2023-01-06 ENCOUNTER — Other Ambulatory Visit: Payer: Self-pay

## 2023-01-06 ENCOUNTER — Telehealth: Payer: Self-pay

## 2023-01-06 MED ORDER — APIXABAN 5 MG PO TABS: 5.0000 mg | ORAL_TABLET | Freq: Two times a day (BID) | ORAL | 3 refills | Status: DC

## 2023-01-06 MED ORDER — ATORVASTATIN CALCIUM 80 MG PO TABS: 80.0000 mg | ORAL_TABLET | Freq: Every day | ORAL | 3 refills | Status: DC

## 2023-01-06 NOTE — Telephone Encounter (Signed)
Patients daughter is calling for a refill on amiodarone. She is also wondering if he should still be taking the Entresto? It is not on his medication list, do you want me to send this in for him?

## 2023-01-07 ENCOUNTER — Telehealth: Payer: Self-pay | Admitting: Oncology

## 2023-01-07 ENCOUNTER — Encounter: Payer: Self-pay | Admitting: Oncology

## 2023-01-07 ENCOUNTER — Other Ambulatory Visit: Payer: Self-pay

## 2023-01-07 MED ORDER — AMIODARONE HCL 200 MG PO TABS: 100.0000 mg | ORAL_TABLET | Freq: Every day | ORAL | 5 refills | Status: DC

## 2023-01-07 NOTE — Telephone Encounter (Signed)
01/07/23 Spoke with daughter and rescheduled all appts

## 2023-01-07 NOTE — Telephone Encounter (Signed)
Yes, Amiodarone 200 mg 1/2 tab daily. No need for entresto is he is not taking. He also needs OV unless he is now in comfort care or palliative care and DNR

## 2023-01-07 NOTE — Telephone Encounter (Signed)
Sent in the amiodarone, pt is now on palliative care.

## 2023-01-08 ENCOUNTER — Ambulatory Visit: Payer: Medicare PPO

## 2023-01-12 ENCOUNTER — Telehealth: Payer: Self-pay | Admitting: Oncology

## 2023-01-12 NOTE — Progress Notes (Signed)
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  He had a hypermetabolic right submandibular node on PET, but no evidence of distant metastasis.  His original lesion was diagnosed in March 2016.  He underwent excision, but refused aggressive therapy.  He has been receiving palliative nivolumab since November 2016, with several interruptions. His treatment was on hold from December 2022 to April 2023 due to cardiorespiratory issues. He continues to tolerate nivolumab without difficulty.  CT imaging in August 2023 remains without evidence of metastatic disease.  However, there were increasing bilateral pleural effusions and small volume abdominal pelvic ascites, with stable pericardial effusion, felt to be most likely due to fluid overload.  He is on Entresto. His current CT scans show frank pulmonary edema but no evidence of metastatic or recurrent melanoma. He will need to see his cardiologist as soon as possible.    B12 deficiency anemia He had missed several B12 injections, but these were resumed and he has continued them monthly.  His hemoglobin had improved with the B12 injections, but then he was found to have be iron deficient.  His B12 level last month was good.   Iron deficiency anemia He has had chronic anemia, but in July was found to have iron deficiency, so was treated with IV iron.  His hemoglobin improved somewhat since receiving the iron but today's results are pending.   Thrombocytopenia ([place]) He is new mild thrombocytopenia of uncertain etiology.  This may be related to his treatment.  This has nearly resolved now.   Plan I advised him that he will need to see cardiologist as soon as possible to evaluate  him and maybe change his medication. He is scheduled for his Nivolumab day 1 cycle 22 on 01/16/2023. His labs today are pending. I will see him back in a month with CBC and CMP.  The patient understands the plans discussed today and is in agreement with them.  He knows to contact our office if he develops concerns prior to his next appointment.  I provided 15 minutes of face-to-face time during this this encounter and > 50% was spent counseling as documented under my assessment and plan.    [name], MD  [place] [address] [place] [address] Dept: [phone] Dept Fax: [phone]   No orders of the defined types were placed in this encounter.   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  His original lesion was diagnosed in March 2016.  He underwent excision, but refused aggressive surgery.  The lesion was at least 4 mm thick with a Clark's level 4, and a mitotic index 10/mm.  The margins were positive, but wide reexcision revealed no residual melanoma.  He had recurrence within 2 months and the lesion had been steadily growing.  He initially had refused seeing a medical oncologist, but finally came to [place] for evaluation and treatment in October 2016.  He does have extensive comorbidities including COPD, diabetes, hypertension, coronary artery disease, history of myocardial infarction, and pacemaker with AICD.  PET revealed hypermetabolism in the right submandibular node, but no evidence of distant metastasis.  He was having significant pain and requiring regular hydrocodone doses.  He also had severe disfigurement and avoided going out in public because of the facial lesion.  He has been receiving palliative nivolumab since November 2016 and  has had a good response with a decrease in the right facial tumor.  He has had associated hypomagnesemia and hypokalemia, so is on oral magnesium and potassium supplements. He has had mild chronic anemia, which has been stable. He was found to have B12 deficiency and continues B12 injections monthly.  CT head/neck in May 2017 revealed some nonspecific skin thickening in the right face in the area of his melanoma in the right submandibular lymph node had decreased in size, now measuring 6 x 12 mm, but still prominent when compared to the left submandibular node.  He had 15 cycles of nivolumab and relocated to continue to get the same treatment.  He had his 30th dose in February 2018 and then returned to the [place] area.     He was admitted to the hospital in early March 2018 for congestive heart failure associated with pleural and pericardial effusions.  He had no leukocytosis, fever or purulent sputum.  The echocardiogram revealed severe diffuse hypokinesis with mild concentric hypertrophy and markedly depressed ejection fraction of 20 to 25%, associated with moderate mitral regurgitation and moderate left atrial dilatation.  He improved with diuresis.  CT chest in March 2018 revealed stable cardiomegaly and stable small pericardial effusion, but no evidence of immune mediated pneumonitis or metastatic disease.  The previously seen bilateral pleural effusions had resolved.  He returned to our care and nivolumab every 2 weeks was resumed in April.  CT chest was repeated in early May after 1 month back on therapy.  This was stable, without evidence of recurrent pleural effusions, immunotherapy toxicities, or evidence of metastatic disease.  He was switched to every 28-day nivolumab in August 2018.  He has had stable disease so has continued on nivolumab.  CT imaging in October 2018 revealed a stable 5 mm level 1B cervical lymph node, as well as a stable 8 mm exophytic lesion of the right face.  CT chest did not  reveal any evidence of metastatic disease. Borderline cardiomegaly and a small amount of pericardial fluid/thickening was seen, but not reaccumulation of the pleural effusions.  CT head did not reveal any evidence of intracranial metastasis.  There were chronic ischemic changes and evidence of old infarcts.   Unfortunately, his wife passed away in 05-28-2018 and she was his primary caregiver His daughters have been caring for him since then.  CT scans have remained stable, so he has continued nivolumab every 4 weeks.  He was given mirtazapine for depression and weight loss the dose was increased to 15 mg.  He was then lost to follow-up from December 2021 to April 2022, at which time he resumed nivolumab and B12 every 4 weeks.   CT head, neck and chest in June 2022 did not reveal any evidence of progressive disease.  He was admitted at [facility] in November/December 2022 due to flu and pneumonia.  Nivolumab was placed on hold in December.  CT chest, abdomen and pelvis in February did not reveal any evidence of metastatic disease.  There was new extensive tree in bud nodularity in the right lung with most significant involvement of the right lower lobe. Findings were most consistent with an infectious  or inflammatory process in a pattern suggestive of atypical infection including nontuberculous mycobacterium.  He was seen by Dr. [name], who recommended bronchoscopy, but the patient did not return for follow-up as he did not wish to pursue this procedure.  Nivolumab was resumed again in April.  He was last CT scans in August did not reveal obvious metastatic disease.  There was an increase in his bilateral pleural effusions, stable pericardial effusion and mild ascites, felt to most likely be due to fluid overload.  He is on Entresto and follows with cardiology.   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  01/24/2015 Cancer Staging   Staging form: Melanoma of the Skin, AJCC 8th Edition -  Clinical stage from 01/24/2015: Stage IIB (cT3b, cN0, cM0) - Signed by [name], MD on 08/19/2021 Histopathologic type: Malignant melanoma, NOS (except juvenile melanoma [id number]) Stage prefix: Initial diagnosis Laterality: Right Lymph-vascular invasion (LVI): LVI not present (absent)/not identified Diagnostic confirmation: Positive histology Specimen type: Excision Staged by: Managing physician Mitotic count: 10 Mitotic unit: mm2 Clark's level: Level IV Tumor-infiltrating lymphocytes: Unknown Neurotropism: Absent Presence of extranodal extension: Absent Breslow depth (mm): 40 Ulceration of the epidermis: Yes Microsatellites: No Primary tumor regression: Unknown Sentinel lymph node biopsy performed: No Matted nodes: No Prognostic indicators: Wide excision negative Stage used in treatment planning: Yes National guidelines used in treatment planning: Yes Type of national guideline used in treatment planning: NCCN   07/18/2020 - 06/18/2022 Chemotherapy   Patient is on Treatment Plan : MELANOMA Nivolumab + B12 q28d     07/18/2020 -  Chemotherapy   Patient is on Treatment Plan : MELANOMA Nivolumab (480) q28d     07/29/2020 Initial Diagnosis   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 ([place])       INTERVAL HISTORY:  Kevin [name] is here today for repeat clinical assessment for reecurrent melanoma. Patient states that he feels ok and had 1 fall last week as he is walking with a cane today. Patient also informed me that he experiences diarrhea with any fluids or food. His CT scans look good and show thickening of the skin of the right cheek with no sign of spread. However, I do see pulmonary edema. He does experience some shortness of breath with exertion and orthopnea. Patient does use his nebulizer every morning. I advised him that he will need to see cardiologist as soon as possible to evaluate him and maybe change his medication. He is scheduled for his Nivolumab day 1 cycle 22 on  01/16/2023. His labs today are pending. I will see him back in 4 weeks with CBC and CMP and have his Nivolumab a week later. He denies signs of infection such as sore throat, sinus drainage, cough, or urinary symptoms.  He denies fevers or recurrent chills. He denies pain. He denies nausea, vomiting, chest pain, dyspnea or cough. His appetite is good and his weight has decreased 7 pounds over last 1.5 months .  REVIEW OF SYSTEMS:  Review of Systems  Constitutional: Negative.  Negative for appetite change, chills, diaphoresis, fatigue, fever and unexpected weight change.  HENT:  Negative.  Negative for hearing loss, lump/mass, mouth sores, nosebleeds, sore throat, tinnitus, trouble swallowing and voice change.   Eyes: Negative.  Negative for eye problems and icterus.  Respiratory:  Positive for shortness of breath (with exertion and orthopnea.). Negative for chest tightness, cough, hemoptysis and wheezing.   Cardiovascular:  Positive for leg swelling. Negative for chest pain and palpitations.  Gastrointestinal:  Positive for diarrhea. Negative for abdominal distention, abdominal pain, blood in stool, constipation, nausea, rectal pain and vomiting.  Endocrine: Negative.   Genitourinary: Negative.  Negative for bladder incontinence, difficulty urinating, dyspareunia, dysuria, frequency, hematuria, nocturia, pelvic pain and penile discharge.   Musculoskeletal: Negative.  Negative for arthralgias, back pain, flank pain, gait problem, myalgias, neck pain and neck stiffness.  Skin: Negative.  Negative for itching, rash and wound.  Neurological: Negative.  Negative for dizziness, extremity weakness, gait problem, headaches, light-headedness, numbness, seizures and speech difficulty.  Hematological:  Negative for adenopathy. Bruises/bleeds easily.  Psychiatric/Behavioral:  Positive for confusion. Negative for decreased concentration, depression, sleep disturbance and suicidal ideas. The patient is not  nervous/anxious.      VITALS:  Blood pressure 117/67, pulse 64, temperature 98 F (36.7 C), temperature source Oral, resp. rate 18, height 5\' 7"  (1.702 m), weight 161 lb 14.4 oz (73.4 kg), SpO2 98 %.  Wt Readings from Last 3 Encounters:  01/22/23 156 lb (70.8 kg)  01/16/23 161 lb (73 kg)  01/13/23 161 lb 14.4 oz (73.4 kg)    Body mass index is 25.36 kg/m.  Performance status (ECOG): 1 - Symptomatic but completely ambulatory  PHYSICAL EXAM:  Physical Exam Vitals and nursing note reviewed.  Constitutional:      General: He is not in acute distress.    Appearance: Normal appearance. He is normal weight. He is not ill-appearing, toxic-appearing or diaphoretic.  HENT:     Head: Normocephalic and atraumatic.     Right Ear: Tympanic membrane, ear canal and external ear normal. There is no impacted cerumen.     Left Ear: Tympanic membrane, ear canal and external ear normal. There is no impacted cerumen.     Nose: Nose normal. No congestion or rhinorrhea.     Mouth/Throat:     Mouth: Mucous membranes are moist.     Pharynx: Oropharynx is clear. No oropharyngeal exudate or posterior oropharyngeal erythema.  Eyes:     General: No scleral icterus.       Right eye: No discharge.        Left eye: No discharge.     Extraocular Movements: Extraocular movements intact.     Conjunctiva/sclera: Conjunctivae normal.     Pupils: Pupils are equal, round, and reactive to light.  Neck:     Vascular: No carotid bruit.  Cardiovascular:     Rate and Rhythm: Normal rate and regular rhythm.     Heart sounds: Normal heart sounds. No murmur heard.    No friction rub. No gallop.  Pulmonary:     Effort: Pulmonary effort is normal. No respiratory distress.     Breath sounds: No stridor. Examination of the right-lower field reveals rales. Examination of the left-lower field reveals rales. Rales present. No decreased breath sounds, wheezing or rhonchi.     Comments: bibasilar rales Chest:     Chest wall:  No tenderness.  Abdominal:     General: Bowel sounds are normal. There is no distension.     Palpations: Abdomen is soft. There is no fluid wave, hepatomegaly, splenomegaly or mass.     Tenderness: There is no abdominal tenderness. There is no right CVA tenderness, left CVA tenderness, guarding or rebound.     Hernia: No hernia is present.     Comments: Mild ascitis of the lower abdomen  Musculoskeletal:        General: No swelling, tenderness, deformity or signs of injury. Normal range of motion.  Cervical back: Normal range of motion and neck supple. No rigidity or tenderness.     Right lower leg: 2+ Edema present.     Left lower leg: 2+ Edema present.  Lymphadenopathy:     Cervical: No cervical adenopathy.  Skin:    General: Skin is warm and dry.     Coloration: Skin is not jaundiced or pale.     Findings: No bruising, erythema, lesion or rash.  Neurological:     General: No focal deficit present.     Mental Status: He is alert and oriented to person, place, and time. Mental status is at baseline.     Cranial Nerves: No cranial nerve deficit.     Sensory: No sensory deficit.     Motor: No weakness.     Coordination: Coordination normal.     Gait: Gait normal.     Deep Tendon Reflexes: Reflexes normal.  Psychiatric:        Mood and Affect: Mood normal.        Behavior: Behavior normal.        Thought Content: Thought content normal.        Judgment: Judgment normal.     LABS:      Latest Ref Rng & Units 01/13/2023   10:24 AM 12/25/2022   12:00 AM 12/01/2022    1:40 PM  CBC  WBC 4.0 - 10.5 K/uL 4.0  4.6     4.2   Hemoglobin 13.0 - 17.0 g/dL 10.1  10.5     10.5   Hematocrit 39.0 - 52.0 % 32.6  32     34.7   Platelets 150 - 400 K/uL 117  127     120      This result is from an external source.      Latest Ref Rng & Units 01/13/2023   10:24 AM 12/25/2022   12:00 AM 12/01/2022    2:08 PM  CMP  Glucose 70 - 99 mg/dL 151   142   BUN 8 - 23 mg/dL 30  31     38    Creatinine 0.61 - 1.24 mg/dL 1.48  1.5     1.69   Sodium 135 - 145 mmol/L 141  141     142   Potassium 3.5 - 5.1 mmol/L 3.9  4.7     4.7   Chloride 98 - 111 mmol/L 108  109     110   CO2 22 - 32 mmol/L 24  24     25    Calcium 8.9 - 10.3 mg/dL 9.0  9.2     8.9   Total Protein 6.5 - 8.1 g/dL 6.9   6.7   Total Bilirubin 0.3 - 1.2 mg/dL 0.5   0.6   Alkaline Phos 38 - 126 U/L 100  76     70   AST 15 - 41 U/L 18  20     14    ALT 0 - 44 U/L 19  17     16       This result is from an external source.   Component Ref Range & Units 1 mo ago (12/01/22) 7 mo ago (06/16/22) 1 yr ago (10/09/21)  Magnesium 1.7 - 2.4 mg/dL 1.7 1.80 R 2.6 High  CM   Component Ref Range & Units 1 mo ago (12/01/22) 3 mo ago (09/23/22) 4 mo ago (08/27/22) 7 mo ago (05/20/22)  T4, Total 4.5 - 12.0 ug/dL 9.0 8.8 CM 9.3 CM 9.1  CM     Component Ref Range & Units 1 mo ago (12/01/22) 3 mo ago (09/23/22) 4 mo ago (08/27/22) 7 mo ago (06/16/22) 7 mo ago (05/20/22)  TSH 0.350 - 4.500 uIU/mL 4.720 High  4.432 CM 4.084 CM 3.881 CM    No results found for: "CEA1", "CEA" / No results found for: "CEA1", "CEA" No results found for: "PSA1" No results found for: "[id number]" No results found for: "CAN125"  No results found for: "TOTALPROTELP", "ALBUMINELP", "A1GS", "A2GS", "BETS", "BETA2SER", "GAMS", "MSPIKE", "SPEI" Lab Results  Component Value Date   TIBC 321 09/24/2022   TIBC 356 05/21/2022   FERRITIN 108 09/24/2022   FERRITIN 46 05/21/2022   IRONPCTSAT 16 (L) 09/24/2022   IRONPCTSAT 10 (L) 05/21/2022   Lab Results  Component Value Date   LDH 108 09/24/2022    STUDIES:  No results found.        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)    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)    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)    Thrombocytopenia ([place]) 07/29/2022    Past Surgical History:  Procedure Laterality Date   APPENDECTOMY     CARDIAC CATHETERIZATION     CORONARY ANGIOPLASTY     PACEMAKER INSERTION     RIGHT/LEFT HEART CATH AND CORONARY ANGIOGRAPHY N/A 10/09/2021   Procedure: RIGHT/LEFT HEART CATH AND CORONARY ANGIOGRAPHY;  Surgeon: Nigel Mormon, MD;  Location: [facility];  Service: Cardiovascular;  Laterality: N/A;    Family History  Problem Relation Age of Onset   Breast cancer Mother    Diabetes Mellitus II Father    Ovarian cancer Sister    Cancer Brother    Cancer Brother    Cancer Maternal Uncle    Breast cancer Niece     Social History:  reports that he has been smoking cigarettes. He has a 30.00 pack-year smoking history. He has never used smokeless tobacco. He reports that he does not currently use alcohol. He reports that he does not currently use drugs.The patient is alone today.  Allergies: No Known Allergies  Current Medications: Current Outpatient Medications  Medication Sig Dispense Refill   albuterol (PROVENTIL) (2.5 MG/3ML) 0.083% nebulizer solution Take by nebulization.     albuterol (VENTOLIN HFA) 108 (90 Base) MCG/ACT inhaler Inhale into the lungs.     ALBUTEROL SULFATE PO Take by mouth.     amiodarone (PACERONE) 200 MG tablet Take 0.5 tablets (100 mg total) by mouth daily. 30 tablet 5   apixaban (ELIQUIS) 5 MG TABS tablet Take 1 tablet (5 mg total) by mouth 2 (two) times daily. 180 tablet 3   atorvastatin (LIPITOR) 80 MG tablet Take 1 tablet (80 mg total)  by mouth at bedtime. 90 tablet 3   Fe Fum-FA-B Cmp-C-Zn-Mg-Mn-Cu (HEMOCYTE PLUS) 106-1 MG CAPS Take 1 capsule by mouth daily.     furosemide (LASIX) 40 MG tablet  Take 1 tablet (40 mg total) by mouth every other day. 90 tablet 1   magnesium oxide (MAG-OX) 400 (240 Mg) MG tablet TAKE 1 TABLET BY MOUTH TWICE A DAY 180 tablet 1   metFORMIN (GLUCOPHAGE) 500 MG tablet Take 500 mg by mouth 2 (two) times daily.     metoprolol succinate (TOPROL-XL) 25 MG 24 hr tablet Take 0.5 tablets (12.5 mg total) by mouth daily. 45 tablet 3   ondansetron (ZOFRAN-ODT) 4 MG disintegrating tablet Take 1 tablet by mouth daily as needed.     spironolactone (ALDACTONE) 25 MG tablet Take 1 tablet (25 mg total) by mouth every morning. 30 tablet 2   SYMBICORT 80-4.5 MCG/ACT inhaler Inhale into the lungs.     tamsulosin (FLOMAX) 0.4 MG CAPS capsule Take by mouth.     TRUE METRIX BLOOD GLUCOSE TEST test strip      TRUEplus Lancets 33G MISC      No current facility-administered medications for this visit.

## 2023-01-12 NOTE — Telephone Encounter (Signed)
01/12/23 spoke with daughter and rescheduled appt with Dr Hinton Rao

## 2023-01-13 ENCOUNTER — Encounter: Payer: Self-pay | Admitting: Oncology

## 2023-01-13 ENCOUNTER — Inpatient Hospital Stay: Payer: Medicare PPO | Attending: Oncology

## 2023-01-13 ENCOUNTER — Inpatient Hospital Stay (INDEPENDENT_AMBULATORY_CARE_PROVIDER_SITE_OTHER): Payer: Medicare PPO | Admitting: Oncology

## 2023-01-13 ENCOUNTER — Other Ambulatory Visit: Payer: Medicare PPO

## 2023-01-13 ENCOUNTER — Telehealth: Payer: Self-pay

## 2023-01-13 ENCOUNTER — Ambulatory Visit: Payer: Medicare PPO | Admitting: Hematology and Oncology

## 2023-01-13 VITALS — BP 117/67 | HR 64 | Temp 98.0°F | Resp 18 | Ht 67.0 in | Wt 161.9 lb

## 2023-01-13 DIAGNOSIS — Z79899 Other long term (current) drug therapy: Secondary | ICD-10-CM | POA: Diagnosis not present

## 2023-01-13 DIAGNOSIS — D519 Vitamin B12 deficiency anemia, unspecified: Secondary | ICD-10-CM | POA: Insufficient documentation

## 2023-01-13 DIAGNOSIS — Z5112 Encounter for antineoplastic immunotherapy: Secondary | ICD-10-CM | POA: Insufficient documentation

## 2023-01-13 DIAGNOSIS — D509 Iron deficiency anemia, unspecified: Secondary | ICD-10-CM | POA: Insufficient documentation

## 2023-01-13 DIAGNOSIS — C4339 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: Secondary | ICD-10-CM

## 2023-01-13 LAB — CBC WITH DIFFERENTIAL (CANCER CENTER ONLY)
Abs Immature Granulocytes: 0.01 10*3/uL (ref 0.00–0.07)
Basophils Absolute: 0 10*3/uL (ref 0.0–0.1)
Basophils Relative: 1 %
Eosinophils Absolute: 0.1 10*3/uL (ref 0.0–0.5)
Eosinophils Relative: 3 %
HCT: 32.6 % — ABNORMAL LOW (ref 39.0–52.0)
Hemoglobin: 10.1 g/dL — ABNORMAL LOW (ref 13.0–17.0)
Immature Granulocytes: 0 %
Lymphocytes Relative: 15 %
Lymphs Abs: 0.6 10*3/uL — ABNORMAL LOW (ref 0.7–4.0)
MCH: 30.9 pg (ref 26.0–34.0)
MCHC: 31 g/dL (ref 30.0–36.0)
MCV: 99.7 fL (ref 80.0–100.0)
Monocytes Absolute: 0.5 10*3/uL (ref 0.1–1.0)
Monocytes Relative: 12 %
Neutro Abs: 2.7 10*3/uL (ref 1.7–7.7)
Neutrophils Relative %: 69 %
Platelet Count: 117 10*3/uL — ABNORMAL LOW (ref 150–400)
RBC: 3.27 MIL/uL — ABNORMAL LOW (ref 4.22–5.81)
RDW: 15.7 % — ABNORMAL HIGH (ref 11.5–15.5)
WBC Count: 4 10*3/uL (ref 4.0–10.5)
nRBC: 0 % (ref 0.0–0.2)

## 2023-01-13 LAB — CMP (CANCER CENTER ONLY)
ALT: 19 U/L (ref 0–44)
AST: 18 U/L (ref 15–41)
Albumin: 3.9 g/dL (ref 3.5–5.0)
Alkaline Phosphatase: 100 U/L (ref 38–126)
Anion gap: 9 (ref 5–15)
BUN: 30 mg/dL — ABNORMAL HIGH (ref 8–23)
CO2: 24 mmol/L (ref 22–32)
Calcium: 9 mg/dL (ref 8.9–10.3)
Chloride: 108 mmol/L (ref 98–111)
Creatinine: 1.48 mg/dL — ABNORMAL HIGH (ref 0.61–1.24)
GFR, Estimated: 47 mL/min — ABNORMAL LOW (ref >60)
Glucose, Bld: 151 mg/dL — ABNORMAL HIGH (ref 70–99)
Potassium: 3.9 mmol/L (ref 3.5–5.1)
Sodium: 141 mmol/L (ref 135–145)
Total Bilirubin: 0.5 mg/dL (ref 0.3–1.2)
Total Protein: 6.9 g/dL (ref 6.5–8.1)

## 2023-01-13 LAB — TSH: TSH: 5.918 u[IU]/mL — ABNORMAL HIGH (ref 0.350–4.500)

## 2023-01-13 NOTE — Telephone Encounter (Signed)
-----   Message from [name], MD sent at 01/13/2023 11:27 AM EST ----- Regarding: cardiology I sent message to scheduler, but whoever can help him get to his cardiologist ASAP, he has fluid in lungs, legs, etc -CHF not controlled, send them scans.  He doesn't know who or if he has appt. Will need to go through daughter

## 2023-01-13 NOTE — Telephone Encounter (Signed)
Patients daughter made aware of appointment with Dr. Einar Gip on January 22, 2023 at 3:45pm.

## 2023-01-15 DIAGNOSIS — I5021 Acute systolic (congestive) heart failure: Secondary | ICD-10-CM | POA: Diagnosis not present

## 2023-01-15 LAB — T4: T4, Total: 8.5 ug/dL (ref 4.5–12.0)

## 2023-01-15 MED FILL — Nivolumab IV Soln 240 MG/24ML: INTRAVENOUS | Qty: 48 | Status: AC

## 2023-01-16 ENCOUNTER — Inpatient Hospital Stay: Payer: Medicare PPO

## 2023-01-16 VITALS — BP 117/61 | HR 63 | Temp 98.4°F | Resp 18 | Wt 161.0 lb

## 2023-01-16 DIAGNOSIS — C4339 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: Secondary | ICD-10-CM

## 2023-01-16 DIAGNOSIS — Z79899 Other long term (current) drug therapy: Secondary | ICD-10-CM | POA: Diagnosis not present

## 2023-01-16 DIAGNOSIS — D519 Vitamin B12 deficiency anemia, unspecified: Secondary | ICD-10-CM | POA: Diagnosis not present

## 2023-01-16 DIAGNOSIS — D509 Iron deficiency anemia, unspecified: Secondary | ICD-10-CM | POA: Diagnosis not present

## 2023-01-16 DIAGNOSIS — Z5112 Encounter for antineoplastic immunotherapy: Secondary | ICD-10-CM | POA: Diagnosis not present

## 2023-01-16 MED ORDER — HEPARIN SOD (PORK) LOCK FLUSH 100 UNIT/ML IV SOLN
500.0000 [IU] | Freq: Once | INTRAVENOUS | Status: AC | PRN
  Administered 2023-01-16: 500 [IU]

## 2023-01-16 MED ORDER — CYANOCOBALAMIN 1000 MCG/ML IJ SOLN
1000.0000 ug | Freq: Once | INTRAMUSCULAR | Status: AC
  Administered 2023-01-16: 1000 ug via INTRAMUSCULAR
  Filled 2023-01-16: qty 1

## 2023-01-16 MED ORDER — SODIUM CHLORIDE 0.9 % IV SOLN
480.0000 mg | Freq: Once | INTRAVENOUS | Status: AC
  Administered 2023-01-16: 480 mg via INTRAVENOUS
  Filled 2023-01-16: qty 48

## 2023-01-16 MED ORDER — SODIUM CHLORIDE 0.9 % IV SOLN: Freq: Once | INTRAVENOUS | Status: AC

## 2023-01-16 MED ORDER — SODIUM CHLORIDE 0.9% FLUSH
10.0000 mL | INTRAVENOUS | Status: DC | PRN
  Administered 2023-01-16: 10 mL

## 2023-01-22 ENCOUNTER — Encounter: Payer: Self-pay | Admitting: Cardiology

## 2023-01-22 ENCOUNTER — Ambulatory Visit: Payer: Medicare PPO | Admitting: Cardiology

## 2023-01-22 ENCOUNTER — Telehealth: Payer: Self-pay

## 2023-01-22 VITALS — BP 124/52 | HR 76 | Resp 16 | Ht 67.0 in | Wt 156.0 lb

## 2023-01-22 DIAGNOSIS — I2510 Atherosclerotic heart disease of native coronary artery without angina pectoris: Secondary | ICD-10-CM | POA: Diagnosis not present

## 2023-01-22 DIAGNOSIS — I3139 Other pericardial effusion (noninflammatory): Secondary | ICD-10-CM | POA: Diagnosis not present

## 2023-01-22 DIAGNOSIS — I5022 Chronic systolic (congestive) heart failure: Secondary | ICD-10-CM

## 2023-01-22 DIAGNOSIS — R601 Generalized edema: Secondary | ICD-10-CM

## 2023-01-22 DIAGNOSIS — N1831 Chronic kidney disease, stage 3a: Secondary | ICD-10-CM | POA: Diagnosis not present

## 2023-01-22 MED ORDER — SPIRONOLACTONE 25 MG PO TABS: 25.0000 mg | ORAL_TABLET | ORAL | 2 refills | Status: DC

## 2023-01-22 NOTE — Telephone Encounter (Signed)
He needs a weighing scale

## 2023-01-22 NOTE — Telephone Encounter (Signed)
Patient home blood pressure is controlled; however patient is noncompliant with daily BP checks and only has 3 blood pressure readings for the month.   Systolic Blood Pressure mmHg -- 114.3 ([id number] - [id number]) Diastolic Blood Pressure mmHg -- 64.2 (50.0 - 77.0) Heart Rate bpm -- 57.8 (47.0 - 74.0)   01/21/23 11:25 AM 108 / 58 mmHg 55 bpm  01/17/23 9:51 PM 112 / 61 mmHg 57 bpm  01/12/23 9:23 PM 120 / 73 mmHg 62 bpm   01/08/23 9:21 PM 126 / 73 mmHg 62 bpm  01/06/23 4:36 PM 129 / 77 mmHg 67 bpm  01/01/23 8:33 PM 107 / 57 mmHg 56 bpm  12/30/22 6:00 PM 114 / 62 mmHg 51 bpm  12/27/22 5:50 PM 116 / 70 mmHg 63 bpm  12/24/22 8:08 PM 104 / 50 mmHg 47 bpm

## 2023-01-22 NOTE — Progress Notes (Signed)
Primary Physician/Referring:  [name], PA-C  Patient ID: Kevin [name], male    DOB: 1941-09-20, 82 y.o.   MRN: [id number]  Chief Complaint  Patient presents with   Chronic systolic heart failure    Follow-up    HPI:    Kevin [name]  is a 82 y.o. Caucasian male  with hypertension, type 2 DM, COPD with ongoing tobacco use and severe nicotine dependence, CAD s/p  NSTEMI and PCI (2015  3 x 24 mm DES to mid LAD), ischemic cardiomyopathy with previously known severe LV systolic dysfunction, chronic bilateral leg edema, h/o ventricular tachycardia SP dual-chamber ICD implantation with Abbott device, Paroxysmal atrial fibrillation and presently on long-term anticoagulation with Eliquis after he presented with 1 episode of A-fib with RVR, respiratory failure and COPD exacerbation in November 2022, stage IIIa chronic kidney disease, melanoma-currently on monthly chemotherapy.   Patient made an appointment to see me as he was told to have "fluid in his heart".  On further questioning, he has responded well to melanoma therapy, states that he is in his routine state of health.  Denies any worsening dyspnea, has chronic bilateral leg edema that is unchanged, denies PND or orthopnea or chest pain, dizziness or syncope.   Patient is accompanied by his daughter.  He has no specific complaints today.    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) 07/29/2022   Past Surgical History:  Procedure Laterality Date    APPENDECTOMY     CARDIAC CATHETERIZATION     CORONARY ANGIOPLASTY     PACEMAKER INSERTION     RIGHT/LEFT HEART CATH AND CORONARY ANGIOGRAPHY N/A 10/09/2021   Procedure: RIGHT/LEFT HEART CATH AND CORONARY ANGIOGRAPHY;  Surgeon: Nigel Mormon, MD;  Location: [facility];  Service: Cardiovascular;  Laterality: N/A;   Family History  Problem Relation Age of Onset   Breast cancer Mother    Diabetes Mellitus II Father    Ovarian cancer Sister    Cancer Brother    Cancer Brother    Cancer Maternal Uncle    Breast cancer Niece     Social History   Tobacco Use   Smoking status: Some Days    Packs/day: 0.50    Years: 60.00    Additional pack years: 0.00    Total pack years: 30.00    Types: Cigarettes   Smokeless tobacco: Never  Substance Use Topics   Alcohol use: Not Currently   Marital Status: Married   ROS  Review of Systems  Cardiovascular:  Positive for dyspnea on exertion and leg swelling. Negative for chest pain.    Objective  Blood pressure (!) 124/52, pulse 76, resp. rate 16, height '5\' 7"'$  (1.702 m), weight 156 lb (70.8 kg), SpO2 96 %.     01/22/2023    3:47 PM 01/16/2023    2:04 PM 01/13/2023   11:05 AM  Vitals with BMI  Height '5\' 7"'$   '5\' 7"'$   Weight 156 lbs  161 lbs 161 lbs 14 oz  BMI 24.43 [id number]  Systolic [id number]  Diastolic 52 61 67  Pulse 76 63 64      Physical Exam Vitals reviewed.  Constitutional:      Appearance: He is ill-appearing.     Comments: Frail  Neck:     Vascular: No carotid bruit or JVD.  Cardiovascular:     Rate and Rhythm: Normal rate and regular rhythm.     Pulses: Decreased pulses.          Dorsalis pedis pulses are 0 on the right side and 0 on the left side.       Posterior tibial pulses are 0 on the right side and 0 on the left side.     Heart sounds: S1 normal and S2 normal. Heart sounds are distant. Murmur heard.     Midsystolic murmur is present with a grade of 2/6 radiating to the apex.     No gallop.   Pulmonary:     Effort: Pulmonary effort is normal.     Breath sounds: Rales (scattered) present.     Comments: No supplemental oxygen Abdominal:     General: Bowel sounds are normal.     Palpations: Abdomen is soft.  Musculoskeletal:     Right lower leg: Edema (3+ pitting to knee) present.     Left lower leg: Edema (3+ pitting to knee) present.     Laboratory examination:   Recent Labs    09/23/22 1415 12/01/22 1408 12/25/22 0000 01/13/23 1024  NA 144 142 141 141  K 4.1 4.7 4.7 3.9  CL 110 110 109* 108  CO2 27 25 24* 24  GLUCOSE 113* 142*  --  151*  BUN 37* 38* 31* 30*  CREATININE 1.47* 1.69* 1.5* 1.48*  CALCIUM 9.2 8.9 9.2 9.0  GFRNONAA 48* 41*  --  47*      Latest Ref Rng & Units 01/13/2023   10:24 AM 12/25/2022   12:00 AM 12/01/2022    2:08 PM  CMP  Glucose 70 - 99 mg/dL 151   142   BUN 8 - 23 mg/dL 30  31     38   Creatinine 0.61 - 1.24 mg/dL 1.48  1.5     1.69   Sodium 135 - 145 mmol/L 141  141     142   Potassium 3.5 - 5.1 mmol/L 3.9  4.7     4.7   Chloride 98 - 111 mmol/L 108  109     110   CO2 22 - 32 mmol/L '24  24     25   '$ Calcium 8.9 - 10.3 mg/dL 9.0  9.2     8.9   Total Protein 6.5 - 8.1 g/dL 6.9   6.7   Total Bilirubin 0.3 - 1.2 mg/dL 0.5   0.6   Alkaline Phos 38 - 126 U/L 100  76     70   AST 15 - 41 U/L '18  20     14   '$ ALT 0 - 44 U/L '19  17     16      '$ This result is from an external source.      Latest Ref Rng & Units 01/13/2023   10:24 AM 12/25/2022   12:00 AM 12/01/2022    2:08 PM  Hepatic Function  Total Protein 6.5 - 8.1 g/dL 6.9   6.7   Albumin 3.5 - 5.0 g/dL 3.9  4.1  3.9   AST 15 - 41 U/L '18  20     14   '$ ALT 0 - 44 U/L '19  17     16   '$ Alk Phosphatase 38 - 126 U/L 100  76     70   Total Bilirubin 0.3 - 1.2 mg/dL 0.5   0.6      This result is from an external source.       Latest Ref Rng & Units 01/13/2023   10:24 AM 12/25/2022   12:00 AM 12/01/2022    1:40 PM  CBC  WBC 4.0 - 10.5 K/uL 4.0  4.6     4.2   Hemoglobin 13.0 - 17.0  g/dL 10.1  10.5     10.5   Hematocrit 39.0 - 52.0 % 32.6  32     34.7   Platelets 150 - 400 K/uL 117  127     120      This result is from an external source.   No results found for: "CHOL", "HDL", "LDLCALC", "LDLDIRECT", "TRIG", "CHOLHDL"   HEMOGLOBIN A1C Lab Results  Component Value Date   HGBA1C 6.7 (H) 10/07/2021   MPG 146 10/07/2021   TSH Recent Labs    09/23/22 1416 12/01/22 1340 01/13/23 1024  TSH 4.432 4.720* 5.918*   External labs:  10/31/2021: BUN 17, creatinine 1.09, GFR 69, sodium 142, potassium 4.7 BNP 396.8 Hemoglobin 10.2, hematocrit 31.8, MCV 94, platelet 177  Allergies  No Known Allergies   Medication list   Current Outpatient Medications:    albuterol (PROVENTIL) (2.5 MG/3ML) 0.083% nebulizer solution, Take by nebulization., Disp: , Rfl:    albuterol (VENTOLIN HFA) 108 (90 Base) MCG/ACT inhaler, Inhale into the lungs., Disp: , Rfl:    ALBUTEROL SULFATE PO, Take by mouth., Disp: , Rfl:    amiodarone (PACERONE) 200 MG tablet, Take 0.5 tablets (100 mg total) by mouth daily., Disp: 30 tablet, Rfl: 5   apixaban (ELIQUIS) 5 MG TABS tablet, Take 1 tablet (5 mg total) by mouth 2 (two) times daily., Disp: 180 tablet, Rfl: 3   atorvastatin (LIPITOR) 80 MG tablet, Take 1 tablet (80 mg total) by mouth at bedtime., Disp: 90 tablet, Rfl: 3   Fe Fum-FA-B Cmp-C-Zn-Mg-Mn-Cu (HEMOCYTE PLUS) 106-1 MG CAPS, Take 1 capsule by mouth daily., Disp: , Rfl:    furosemide (LASIX) 40 MG tablet, Take 1 tablet (40 mg total) by mouth every other day., Disp: 90 tablet, Rfl: 1   magnesium oxide (MAG-OX) 400 (240 Mg) MG tablet, TAKE 1 TABLET BY MOUTH TWICE A DAY, Disp: 180 tablet, Rfl: 1   metFORMIN (GLUCOPHAGE) 500 MG tablet, Take 500 mg by mouth 2 (two) times daily., Disp: , Rfl:    metoprolol succinate (TOPROL-XL) 25 MG 24 hr tablet, Take 0.5 tablets (12.5 mg total) by mouth daily., Disp: 45 tablet, Rfl: 3   ondansetron (ZOFRAN-ODT) 4 MG disintegrating tablet, Take 1 tablet by mouth  daily as needed., Disp: , Rfl:    spironolactone (ALDACTONE) 25 MG tablet, Take 1 tablet (25 mg total) by mouth every morning., Disp: 30 tablet, Rfl: 2   SYMBICORT 80-4.5 MCG/ACT inhaler, Inhale into the lungs., Disp: , Rfl:    tamsulosin (FLOMAX) 0.4 MG CAPS capsule, Take by mouth., Disp: , Rfl:    TRUE METRIX BLOOD GLUCOSE TEST test strip, , Disp: , Rfl:    TRUEplus Lancets 33G MISC, , Disp: , Rfl:    Radiology:   CT scan of the chest abdomen and pelvis  with contrast 12/24/2022: Comparison 06/12/2022. Calcified coronary artery disease and post PTCA.  Mild to moderate cardiomegaly with moderate-sized pericardial effusion unchanged from prior CT scan. Aortic atherosclerosis without aneurysm. Central pulmonary vasculature is unremarkable.  Portacatheter in place terminating in the lower superior vena cava. Lungs: Bilateral small effusion stable on the left and slightly diminished on the right since previous imaging.  Impression:  1.  No definite signs of metastasis. 2.  Findings of suspected heart failure with effusions and pulmonary edema.  Pulmonary edema is new compared to previous imaging..  Pleural effusion is slightly diminished. 3.  Patchy groundglass opacity may reflect component of edema. Coronary artery disease with moderate pericardial effusion. 4.  Anasarca with body wall edema and ascites.  Cardiac Studies:   PCV ECHOCARDIOGRAM COMPLETE 01/02/2022 Left ventricle cavity is moderately dilated. Normal left ventricular wall thickness. Abnormal septal wall motion due to right ventricle pacemaker. Severe global hypokinesis. LVEF <20%. No evidence of LV thrombus on this noncontrast study. Doppler evidence of grade II (pseudonormal) diastolic dysfunction, elevated LAP. Calculated EF 29%. Left atrial cavity is mildly dilated. Structurally normal trileaflet aortic valve.  Moderate (Grade II) aortic regurgitation. Structurally normal mitral valve.  Moderate to severe mitral  regurgitation. Structurally normal tricuspid valve.  Mild to moderate tricuspid regurgitation. Estimated pulmonary artery systolic pressure 55 mmHg. Small pericardial effusion most adjacent to right atrium. There is no hemodynamic significance. Previous study on 10/07/2021 reported mild MR. No other significant change noted.  Right/left heart catheterization and coronary angiography 10/09/2021: LM: Normal LAD: Patent (2015  3 x 24 mm DES) mid LAD stent. No significant disease Lcx: No significant disease RCA: Ostial 100% occlusion. Left-to-right collateral up to mid RCA     RA: 11 mmHg RV: 51/0 mmHg PA: 64/22 mmHg, mPAP 34 mmHg PCW: 23 mmHg   CO: 3.6 L/min CI: 2.0 L/min/m2   Impression: Mildly decompensated nonischemic cardiomyopathy LV size and function out of proportion to RCA occlusion and patent mid LAD stent Recommend GDMT for heart failure   ICD: [employer] Jude/Abbott dual-chamber defibrillator  ICD being followed by [employer].     EKG:   EKG 08/14/2022: Atrially paced rhythm at rate of 61 bpm, demand a pacing with first-degree AV block.  LBBB.  No further analysis.01/31/2022 sinus rhythm with left axis and left bundle branch block, no further analysis.  Assessment     ICD-10-CM   1. Chronic systolic heart failure (HCC)  I50.22 PCV ECHOCARDIOGRAM COMPLETE    spironolactone (ALDACTONE) 25 MG tablet    2. Pericardial effusion  I31.39 PCV ECHOCARDIOGRAM COMPLETE    3. Anasarca  R60.1     4. Stage 3a chronic kidney disease (HCC)  N18.31     5. Coronary artery disease involving native coronary artery of native heart without angina pectoris  I25.10 CMP14+EGFR    Brain natriuretic peptide    Lipid Panel With LDL/HDL Ratio      Medications Discontinued During This Encounter  Medication Reason   potassium chloride SA (KLOR-CON M) 20 MEQ tablet Change in therapy    Meds ordered this encounter  Medications   spironolactone (ALDACTONE) 25 MG tablet    Sig:  Take 1 tablet (25 mg total) by mouth every morning.    Dispense:  30 tablet    Refill:  2    Recommendations:   Kevin [name] is a 82 y.o. Caucasian male  with hypertension, type 2 DM, COPD with ongoing tobacco use and severe nicotine dependence, CAD s/p  NSTEMI  and PCI (2015  3 x 24 mm DES to mid LAD), ischemic cardiomyopathy with previously known severe LV systolic dysfunction, chronic bilateral leg edema, h/o ventricular tachycardia SP dual-chamber ICD implantation with Abbott device, Paroxysmal atrial fibrillation and presently on long-term anticoagulation with Eliquis after he presented with 1 episode of A-fib with RVR, respiratory failure and COPD exacerbation in November 2022, stage IIIa chronic kidney disease, melanoma-currently on monthly chemotherapy.    1. Chronic systolic heart failure [facility]) Patient made an appointment to see me as he was told to have "fluid in his heart".  I reviewed the CT scan of the chest and abdomen that was recently performed on 12/24/2022, he has moderate pericardial effusion, that appears to be stable from 06/12/2022, small bilateral pleural effusion as well.  Clinically he does not appear to be in acute decompensated heart failure.  He was previously on low-dose Entresto but was discontinued due to renal insufficiency and low blood pressure.  He is presently on low-dose metoprolol succinate and amiodarone 100 mg daily.  In spite of placement multiple medical comorbidity, patient has done remarkably well.  He at this point has no specific complaints of dyspnea, chest pain, PND or orthopnea.  Chronic leg edema persist and this is unchanged.  I would like to repeat echocardiogram and also obtain routine labs including CMP and he also needs lipid profile testing.  I will start him on spironolactone 25 mg daily and patient is aware to get labs in 2 weeks.  2. Pericardial effusion Pericardial effusion appears to be a part of anasarca.  CT scan also revealed  cystitis, bilateral pleural effusion, he has 3+ bilateral leg edema.  He will also need to be excluded for severe hypoproteinemia although his albumin level was fairly within normal limits 2 months ago.  Effusion and ascites may also be related to pulmonary hypertension and right ventricular failure in view of underlying severe COPD.  Echocardiogram will shed some light.  Although he is being treated for melanoma, for the past few years, appears to be stable, and as the effusion is chronic, do not suspect malignant effusion.  3. Anasarca I will try spironolactone although he has stage IIIa chronic kidney disease, will continue to monitor his renal function closely.  4. Stage 3a chronic kidney disease ([place]) As dictated above. I reviewed his external labs.  5. Coronary artery disease involving native coronary artery of native heart without angina pectoris From coronary artery disease standpoint, patient has ischemic cardiomyopathy, previously known occluded RCA with severe LV systolic dysfunction, prior to 2015 his EF was 35%, then came in with NSTEMI and had LAD stent placed in 2015 but echocardiogram had revealed anterior akinesis. Patient probably has burnt out ischemic cardiomyopathy.    Patient has history of atrial fibrillation when he presented with hypoxemic respiratory failure in November 2022. I reviewed his ICD data for the past several years that is noted on [place], and he has no documented prior atrial fibrillation.  Presently on Eliquis 5 mg p.o. twice daily.  This may need to be addressed to see whether he needs to be on anticoagulation or if he indeed does, may need to reduce the dose to 2.5 mg twice daily in view of renal insufficiency, age and his weight.  Patient's daughter Kevin [name] is present.   He has no ICD transmissions.  Last transmission appears to be on 12/07/2019.  I will try to again transfer his ICD to our clinic and I will be seeing  the patient back in  6 weeks, will try to schedule him for in person ICD check.  Patient is also enrolled in remote care monitoring, we will be providing him with a new scale for weighing himself daily.  This was a 40-minute office visit encounter with addressing complex issues, evaluation of external records, external labs and coordination of care.   Adrian Prows, MD, [facility] 01/22/2023, 5:44 PM Office: [phone] Fax: [phone] Pager: [phone]

## 2023-01-24 ENCOUNTER — Encounter: Payer: Self-pay | Admitting: Oncology

## 2023-01-26 ENCOUNTER — Telehealth: Payer: Self-pay

## 2023-01-26 NOTE — Telephone Encounter (Signed)
I called patient to verify address for a transmitter box to be mailed.   Patient's daughter stated that someone was to call her to schedule appointment and no one has yet to call her.   Please let me know when that appointment is so that I can call a rep to be here.

## 2023-01-27 NOTE — Telephone Encounter (Signed)
This is pacemaker check and also new weighing scale to be provided by [place]. Then OV with me in 3-4 weeks

## 2023-01-28 NOTE — Telephone Encounter (Signed)
Please f/u in a couple days

## 2023-01-28 NOTE — Telephone Encounter (Signed)
Appointment changed to 16th. All three will be done the same day: OV, ICD check and Echo.

## 2023-01-30 DIAGNOSIS — I5040 Unspecified combined systolic (congestive) and diastolic (congestive) heart failure: Secondary | ICD-10-CM | POA: Diagnosis not present

## 2023-01-30 DIAGNOSIS — J449 Chronic obstructive pulmonary disease, unspecified: Secondary | ICD-10-CM | POA: Diagnosis not present

## 2023-01-30 DIAGNOSIS — J9611 Chronic respiratory failure with hypoxia: Secondary | ICD-10-CM | POA: Diagnosis not present

## 2023-02-01 ENCOUNTER — Encounter: Payer: Self-pay | Admitting: Cardiology

## 2023-02-01 DIAGNOSIS — Z95810 Presence of automatic (implantable) cardiac defibrillator: Secondary | ICD-10-CM

## 2023-02-01 DIAGNOSIS — Z4502 Encounter for adjustment and management of automatic implantable cardiac defibrillator: Secondary | ICD-10-CM

## 2023-02-01 HISTORY — DX: Presence of automatic (implantable) cardiac defibrillator: Z95.810

## 2023-02-01 HISTORY — DX: Encounter for adjustment and management of automatic implantable cardiac defibrillator: Z45.02

## 2023-02-10 ENCOUNTER — Encounter: Payer: Self-pay | Admitting: Oncology

## 2023-02-10 ENCOUNTER — Inpatient Hospital Stay: Payer: Medicare PPO

## 2023-02-10 ENCOUNTER — Inpatient Hospital Stay: Payer: Medicare PPO | Attending: Oncology | Admitting: Oncology

## 2023-02-10 DIAGNOSIS — E611 Iron deficiency: Secondary | ICD-10-CM | POA: Insufficient documentation

## 2023-02-10 DIAGNOSIS — C4339 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: Secondary | ICD-10-CM | POA: Insufficient documentation

## 2023-02-10 DIAGNOSIS — A0472 Enterocolitis due to Clostridium difficile, not specified as recurrent: Secondary | ICD-10-CM | POA: Insufficient documentation

## 2023-02-10 DIAGNOSIS — D649 Anemia, unspecified: Secondary | ICD-10-CM | POA: Insufficient documentation

## 2023-02-10 DIAGNOSIS — R197 Diarrhea, unspecified: Secondary | ICD-10-CM | POA: Insufficient documentation

## 2023-02-10 DIAGNOSIS — Z79899 Other long term (current) drug therapy: Secondary | ICD-10-CM | POA: Insufficient documentation

## 2023-02-10 DIAGNOSIS — D696 Thrombocytopenia, unspecified: Secondary | ICD-10-CM | POA: Insufficient documentation

## 2023-02-10 NOTE — Progress Notes (Incomplete)
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  He had a hypermetabolic right submandibular node on PET, but no evidence of distant metastasis.  His original lesion was diagnosed in March 2016.  He underwent excision, but refused aggressive therapy.  He has been receiving palliative nivolumab since November 2016, with several interruptions. His treatment was on hold from December 2022 to April 2023 due to cardiorespiratory issues. He continues to tolerate nivolumab without difficulty.  CT imaging in August 2023 remains without evidence of metastatic disease.  However, there were increasing bilateral pleural effusions and small volume abdominal pelvic ascites, with stable pericardial effusion, felt to be most likely due to fluid overload.  He is on Entresto.   B12 deficiency anemia He had missed several B12 injections, but these were resumed and he has continued them monthly.  His hemoglobin had improved with the B12 injections, but then he was found to have be iron deficient.  His B12 level last month was good.   Iron deficiency anemia He has had chronic anemia, but in July was found to have iron deficiency, so was treated with IV iron.  His hemoglobin improved somewhat since receiving the iron but now is back to 10.1.   Thrombocytopenia ([place]) He is new mild thrombocytopenia of uncertain etiology.  This may be related to his treatment.  This has nearly resolved now.    He will continue monthly infusions of nivolumab.  We will plan to see him back in 4 weeks with a CBC and comprehensive metabolic panel prior to his next treatment.  The patient understands the plans discussed today and is in agreement with them.  He knows  to contact our office if he develops concerns prior to his next appointment.     [facility] [address] [place] [address] Dept: [phone] Dept Fax: [phone]   No orders of the defined types were placed in this encounter.     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  His original lesion was diagnosed in March 2016.  He underwent excision, but refused aggressive surgery.  The lesion was at least 4 mm thick with a Clark's level 4, and a mitotic index 10/mm.  The margins were positive, but wide reexcision revealed no residual melanoma.  He had recurrence within 2 months and the lesion had been steadily growing.  He initially had refused seeing a medical oncologist, but finally came to [place] for evaluation and treatment in October 2016.  He does have extensive comorbidities including COPD, diabetes, hypertension, coronary artery disease, history of myocardial infarction, and pacemaker with AICD.  PET revealed hypermetabolism in the right submandibular node, but no evidence of distant metastasis.  He was having significant pain and requiring regular hydrocodone doses.  He also had severe disfigurement and avoided going out in public because of the facial lesion.  He has been receiving palliative nivolumab since November 2016 and has had a good response with a decrease in the right facial tumor.  He has had  associated hypomagnesemia and hypokalemia, so is on oral magnesium and potassium supplements. He has had mild chronic anemia, which has been stable. He was found to have B12 deficiency and continues B12 injections monthly.  CT head/neck in May 2017 revealed some nonspecific skin thickening in the right face in the area of his melanoma  in the right submandibular lymph node had decreased in size, now measuring 6 x 12 mm, but still prominent when compared to the left submandibular node.  He had 15 cycles of nivolumab and relocated to continue to get the same treatment.  He had his 30th dose in February 2018 and then returned to the [place] area.     He was admitted to the hospital in early March 2018 for congestive heart failure associated with pleural and pericardial effusions.  He had no leukocytosis, fever or purulent sputum.  The echocardiogram revealed severe diffuse hypokinesis with mild concentric hypertrophy and markedly depressed ejection fraction of 20 to 25%, associated with moderate mitral regurgitation and moderate left atrial dilatation.  He improved with diuresis.  CT chest in March 2018 revealed stable cardiomegaly and stable small pericardial effusion, but no evidence of immune mediated pneumonitis or metastatic disease.  The previously seen bilateral pleural effusions had resolved.  He returned to our care and nivolumab every 2 weeks was resumed in April.  CT chest was repeated in early May after 1 month back on therapy.  This was stable, without evidence of recurrent pleural effusions, immunotherapy toxicities, or evidence of metastatic disease.  He was switched to every 28-day nivolumab in August 2018.  He has had stable disease so has continued on nivolumab.  CT imaging in October 2018 revealed a stable 5 mm level 1B cervical lymph node, as well as a stable 8 mm exophytic lesion of the right face.  CT chest did not reveal any evidence of metastatic disease. Borderline cardiomegaly and a small amount of pericardial fluid/thickening was seen, but not reaccumulation of the pleural effusions.  CT head did not reveal any evidence of intracranial metastasis.  There were chronic ischemic changes and evidence of old infarcts.   Unfortunately, his wife passed away in 06-19-2018 and she was his primary caregiver His daughters have  been caring for him since then.  CT scans have remained stable, so he has continued nivolumab every 4 weeks.  He was given mirtazapine for depression and weight loss the dose was increased to 15 mg.  He was then lost to follow-up from December 2021 to April 2022, at which time he resumed nivolumab and B12 every 4 weeks.   CT head, neck and chest in June 2022 did not reveal any evidence of progressive disease.  He was admitted at [facility] in November/December 2022 due to flu and pneumonia.  Nivolumab was placed on hold in December.  CT chest, abdomen and pelvis in February did not reveal any evidence of metastatic disease.  There was new extensive tree in bud nodularity in the right lung with most significant involvement of the right lower lobe. Findings were most consistent with an infectious or inflammatory process in a pattern suggestive of atypical infection including nontuberculous mycobacterium.  He was seen by Dr. [name], who recommended bronchoscopy, but the patient did not return for follow-up as he did not wish to pursue this procedure.  Nivolumab was resumed again in April.  He was last CT scans in August did not reveal obvious metastatic disease.  There was an increase in his bilateral pleural  effusions, stable pericardial effusion and mild ascites, felt to most likely be due to fluid overload.  He is on Entresto and follows with cardiology.   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  01/24/2015 Cancer Staging   Staging form: Melanoma of the Skin, AJCC 8th Edition - Clinical stage from 01/24/2015: Stage IIB (cT3b, cN0, cM0) - Signed by [name], MD on 08/19/2021 Histopathologic type: Malignant melanoma, NOS (except juvenile melanoma [id number]) Stage prefix: Initial diagnosis Laterality: Right Lymph-vascular invasion (LVI): LVI not present (absent)/not identified Diagnostic confirmation: Positive histology Specimen type: Excision Staged by: Managing  physician Mitotic count: 10 Mitotic unit: mm2 Clark's level: Level IV Tumor-infiltrating lymphocytes: Unknown Neurotropism: Absent Presence of extranodal extension: Absent Breslow depth (mm): 40 Ulceration of the epidermis: Yes Microsatellites: No Primary tumor regression: Unknown Sentinel lymph node biopsy performed: No Matted nodes: No Prognostic indicators: Wide excision negative Stage used in treatment planning: Yes National guidelines used in treatment planning: Yes Type of national guideline used in treatment planning: NCCN   07/18/2020 - 06/18/2022 Chemotherapy   Patient is on Treatment Plan : MELANOMA Nivolumab + B12 q28d     07/18/2020 -  Chemotherapy   Patient is on Treatment Plan : MELANOMA Nivolumab (480) q28d     07/29/2020 Initial Diagnosis   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 ([place])       INTERVAL HISTORY:  Kevin [name] is here today for repeat clinical assessment for recurrent melanoma.         He denies any new symptoms.  He reports stable shortness of breath.  He denies cough or chest pain.    He states he does uses oxygen at night as needed.  He reports this persistent lower extremity edema, which improves overnight.  The cardiologist has been working on diuresis and his BUN is 24 with a creatinine of 1.6.  The rest of his CMP is unremarkable other than a nonfasting blood sugar of 185.  His hemoglobin has dropped from 10.3 to 10.1 but his platelet count came up to 130,000 from 113,000.  He has intermittent diarrhea not requiring medication.    He denies itching or skin rash.  He denies abnormal bleeding.  He denies melena or hematochezia.  He denies fevers or chills. He denies pain. His appetite is good. His weight has decreased 2 pounds over last 6 weeks .  He ambulates with a cane.  He states he is up during the day and walks outside as much as he can.  REVIEW OF SYSTEMS:  Review of Systems  Constitutional: Negative.  Negative for appetite change, chills,  diaphoresis, fatigue, fever and unexpected weight change.  HENT:  Negative.  Negative for hearing loss, lump/mass, mouth sores, nosebleeds, sore throat, tinnitus, trouble swallowing and voice change.   Eyes: Negative.  Negative for eye problems and icterus.  Respiratory:  Positive for shortness of breath (Stable). Negative for chest tightness, cough, hemoptysis and wheezing.   Cardiovascular: Negative.  Negative for chest pain, leg swelling and palpitations.  Gastrointestinal: Negative.  Negative for abdominal distention, abdominal pain, blood in stool, constipation, diarrhea (Intermittent, not severe), nausea, rectal pain and vomiting.  Endocrine: Negative.  Negative for hot flashes.  Genitourinary: Negative.  Negative for bladder incontinence, difficulty urinating, dyspareunia, dysuria, frequency, hematuria, nocturia, pelvic pain and penile discharge.   Musculoskeletal: Negative.  Negative for arthralgias, back pain, flank pain, gait problem, myalgias, neck pain and neck stiffness.  Skin: Negative.  Negative for itching, rash and wound.  Neurological: Negative.  Negative for dizziness, extremity weakness, gait problem, headaches, light-headedness, numbness, seizures and speech difficulty.  Hematological:  Negative for adenopathy. Bruises/bleeds easily.  Psychiatric/Behavioral:  Positive for confusion. Negative for decreased concentration, depression, sleep disturbance and suicidal ideas. The patient is not nervous/anxious.      VITALS:  There were no vitals taken for this visit.  Wt Readings from Last 3 Encounters:  01/22/23 156 lb (70.8 kg)  01/16/23 161 lb (73 kg)  01/13/23 161 lb 14.4 oz (73.4 kg)    There is no height or weight on file to calculate BMI.  Performance status (ECOG): 1 - Symptomatic but completely ambulatory  PHYSICAL EXAM:  Physical Exam Vitals and nursing note reviewed.  Constitutional:      General: He is not in acute distress.    Appearance: Normal appearance.  He is normal weight. He is not ill-appearing, toxic-appearing or diaphoretic.  HENT:     Head: Normocephalic and atraumatic.     Right Ear: Tympanic membrane, ear canal and external ear normal. There is no impacted cerumen.     Left Ear: Tympanic membrane, ear canal and external ear normal. There is no impacted cerumen.     Nose: Nose normal. No congestion or rhinorrhea.     Mouth/Throat:     Mouth: Mucous membranes are moist.     Pharynx: Oropharynx is clear. No oropharyngeal exudate or posterior oropharyngeal erythema.  Eyes:     General: No scleral icterus.       Right eye: No discharge.        Left eye: No discharge.     Extraocular Movements: Extraocular movements intact.     Conjunctiva/sclera: Conjunctivae normal.     Pupils: Pupils are equal, round, and reactive to light.  Neck:     Vascular: No carotid bruit.  Cardiovascular:     Rate and Rhythm: Normal rate and regular rhythm.     Pulses: Normal pulses.     Heart sounds: Normal heart sounds. No murmur heard.    No friction rub. No gallop.  Pulmonary:     Effort: Pulmonary effort is normal. No respiratory distress.     Breath sounds: No stridor. Examination of the right-lower field reveals decreased breath sounds and rales. Examination of the left-lower field reveals decreased breath sounds and rales. Decreased breath sounds and rales present. No wheezing or rhonchi.  Chest:     Chest wall: No tenderness.  Abdominal:     General: Bowel sounds are normal. There is no distension.     Palpations: Abdomen is soft. There is no fluid wave, hepatomegaly, splenomegaly or mass.     Tenderness: There is no abdominal tenderness. There is no right CVA tenderness, left CVA tenderness, guarding or rebound.     Hernia: No hernia is present.     Comments: Mild edema of the lower abdomen  Musculoskeletal:        General: No swelling, tenderness, deformity or signs of injury. Normal range of motion.     Cervical back: Normal range of  motion and neck supple. No rigidity or tenderness.     Right lower leg: Edema (2+ to the knees) present.     Left lower leg: Edema (2+ to the knees) present.  Lymphadenopathy:     Cervical: No cervical adenopathy.     Upper Body:     Right upper body: No supraclavicular or axillary adenopathy.     Left upper body: No supraclavicular or axillary adenopathy.  Lower Body: No right inguinal adenopathy. No left inguinal adenopathy.  Skin:    General: Skin is warm and dry.     Coloration: Skin is not jaundiced or pale.     Findings: No bruising, erythema, lesion or rash.  Neurological:     General: No focal deficit present.     Mental Status: He is alert and oriented to person, place, and time. Mental status is at baseline.     Cranial Nerves: No cranial nerve deficit.     Sensory: No sensory deficit.     Motor: No weakness.     Coordination: Coordination normal.     Gait: Gait normal.     Deep Tendon Reflexes: Reflexes normal.  Psychiatric:        Mood and Affect: Mood normal.        Behavior: Behavior normal.        Thought Content: Thought content normal.        Judgment: Judgment normal.     LABS:      Latest Ref Rng & Units 01/13/2023   10:24 AM 12/25/2022   12:00 AM 12/01/2022    1:40 PM  CBC  WBC 4.0 - 10.5 K/uL 4.0  4.6     4.2   Hemoglobin 13.0 - 17.0 g/dL 10.1  10.5     10.5   Hematocrit 39.0 - 52.0 % 32.6  32     34.7   Platelets 150 - 400 K/uL 117  127     120      This result is from an external source.       Latest Ref Rng & Units 01/13/2023   10:24 AM 12/25/2022   12:00 AM 12/01/2022    2:08 PM  CMP  Glucose 70 - 99 mg/dL 151   142   BUN 8 - 23 mg/dL 30  31     38   Creatinine 0.61 - 1.24 mg/dL 1.48  1.5     1.69   Sodium 135 - 145 mmol/L 141  141     142   Potassium 3.5 - 5.1 mmol/L 3.9  4.7     4.7   Chloride 98 - 111 mmol/L 108  109     110   CO2 22 - 32 mmol/L 24  24     25    Calcium 8.9 - 10.3 mg/dL 9.0  9.2     8.9   Total Protein 6.5 - 8.1 g/dL  6.9   6.7   Total Bilirubin 0.3 - 1.2 mg/dL 0.5   0.6   Alkaline Phos 38 - 126 U/L 100  76     70   AST 15 - 41 U/L 18  20     14    ALT 0 - 44 U/L 19  17     16       This result is from an external source.    Component Ref Range & Units 4 wk ago 01/13/23 2 mo ago 4 mo ago 5 mo ago 7 mo ago 8 mo ago 10 mo ago  TSH 0.350 - 4.500 uIU/mL 5.918 High  4.720 High  CM 4.432 CM 4.084 CM 3.881 CM 3.387 CM 3.634 CM   Component Ref Range & Units 4 wk ago (01/13/23) 2 mo ago (12/01/22) 4 mo ago (09/23/22) 5 mo ago (08/27/22) 8 mo ago (05/20/22) 10 mo ago (04/16/22) 10 mo ago (03/19/22)  T4, Total 4.5 - 12.0 ug/dL 8.5 9.0 CM 8.8 CM  9.3 CM 9.1 CM 9.9 CM 10.4       No results found for: "CEA1", "CEA" / No results found for: "CEA1", "CEA" No results found for: "PSA1" No results found for: "[id number]" No results found for: "CAN125"  No results found for: "TOTALPROTELP", "ALBUMINELP", "A1GS", "A2GS", "BETS", "BETA2SER", "GAMS", "MSPIKE", "SPEI" Lab Results  Component Value Date   TIBC 321 09/24/2022   TIBC 356 05/21/2022   FERRITIN 108 09/24/2022   FERRITIN 46 05/21/2022   IRONPCTSAT 16 (L) 09/24/2022   IRONPCTSAT 10 (L) 05/21/2022   Lab Results  Component Value Date   LDH 108 09/24/2022    STUDIES:  No results found.               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)    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)  (HCC)    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)    Thrombocytopenia ([place]) 07/29/2022    Past Surgical History:  Procedure Laterality Date   APPENDECTOMY     CARDIAC CATHETERIZATION     CORONARY ANGIOPLASTY     PACEMAKER INSERTION     RIGHT/LEFT HEART CATH AND CORONARY ANGIOGRAPHY N/A 10/09/2021   Procedure: RIGHT/LEFT HEART CATH AND CORONARY ANGIOGRAPHY;  Surgeon: Nigel Mormon, MD;  Location: [facility];  Service: Cardiovascular;  Laterality: N/A;    Family History  Problem Relation Age of Onset   Breast cancer Mother    Diabetes Mellitus II Father    Ovarian cancer Sister    Cancer Brother    Cancer Brother    Cancer Maternal Uncle    Breast cancer Niece     Social History:  reports that he has been smoking cigarettes. He has a 30.00 pack-year smoking history. He has never used smokeless tobacco. He reports that he does not currently use alcohol. He reports that he does not currently use drugs.The patient is alone today.  Allergies: No Known Allergies  Current Medications: Current Outpatient Medications  Medication Sig Dispense Refill   albuterol (PROVENTIL) (2.5 MG/3ML) 0.083% nebulizer solution Take by nebulization.     albuterol (VENTOLIN HFA) 108 (90 Base) MCG/ACT inhaler Inhale into the lungs.     ALBUTEROL SULFATE PO Take by mouth.     amiodarone (PACERONE) 200 MG tablet Take 0.5 tablets (100 mg total) by mouth daily. 30 tablet 5   apixaban (ELIQUIS) 5 MG TABS tablet Take 1 tablet (5 mg total) by mouth 2 (two) times daily. 180 tablet 3   atorvastatin (LIPITOR) 80 MG tablet Take 1 tablet (80 mg total) by mouth at bedtime. 90 tablet 3   Fe Fum-FA-B Cmp-C-Zn-Mg-Mn-Cu (HEMOCYTE PLUS) 106-1 MG CAPS Take 1 capsule by mouth daily.     furosemide (LASIX) 40 MG tablet Take 1 tablet (40 mg total) by mouth every other day. 90 tablet 1   magnesium oxide (MAG-OX) 400 (240 Mg) MG tablet TAKE 1 TABLET BY MOUTH  TWICE A DAY 180 tablet 1   metFORMIN  (GLUCOPHAGE) 500 MG tablet Take 500 mg by mouth 2 (two) times daily.     metoprolol succinate (TOPROL-XL) 25 MG 24 hr tablet Take 0.5 tablets (12.5 mg total) by mouth daily. 45 tablet 3   ondansetron (ZOFRAN-ODT) 4 MG disintegrating tablet Take 1 tablet by mouth daily as needed.     spironolactone (ALDACTONE) 25 MG tablet Take 1 tablet (25 mg total) by mouth every morning. 30 tablet 2   SYMBICORT 80-4.5 MCG/ACT inhaler Inhale into the lungs.     tamsulosin (FLOMAX) 0.4 MG CAPS capsule Take by mouth.     TRUE METRIX BLOOD GLUCOSE TEST test strip      TRUEplus Lancets 33G MISC      No current facility-administered medications for this visit.    I,Jasmine M Lassiter,acting as a scribe for [name], MD.,have documented all relevant documentation on the behalf of [name], MD,as directed by  [name], MD while in the presence of [name], MD.

## 2023-02-12 ENCOUNTER — Encounter: Payer: Self-pay | Admitting: Oncology

## 2023-02-13 ENCOUNTER — Ambulatory Visit: Payer: Medicare PPO

## 2023-02-20 NOTE — Progress Notes (Unsigned)
[facility]  [address] [place],  [place]  [address] [phone]  Clinic Day:  02/24/2023  Referring physician: Lonie Peak, PA-C  ASSESSMENT & PLAN:   Assessment & Plan: Anemia Worsening anemia. He previously had both iron and B12 deficiency.  He last received IV iron in July.  He continues B12 injections monthly, with occasional missed doses.  I will repeat iron levels B12 and folate today, as well as check LDH.  Iron studies reveal recurrent iron deficiency with a saturation of 11%.  I will arrange for him to have IV iron again at his next visit.  Diarrhea Worsening diarrhea with double the amount of stools from previous.  This may be due to infection or immune mediated colitis.  Stool studies are pending from today.  We will hold his nivolumab this week and plan to see him back in 1 week.  If there is no infection, we will likely need to start him on high-dose prednisone.  GI panel was negative.  C. difficile and stool for WBCs could not be completed as the stool was formed.  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  He had a hypermetabolic right submandibular node on PET, but no evidence of distant metastasis.  His original lesion was diagnosed in March 2016.  He underwent excision, but refused aggressive therapy.  He has been receiving palliative nivolumab since November 2016, with several interruptions. His treatment was on hold from December 2022 to April 2023 due to cardiorespiratory issues. He continues to tolerate nivolumab without difficulty.  CT imaging in August 2023 remained without evidence of metastatic disease.  However, there were increasing bilateral pleural effusions and small volume abdominal pelvic ascites, with stable pericardial effusion, felt to be most likely due to fluid overload.  He is on Entresto.    He had delay in his treatment from November to  January due to family issues.  CT imaging in February did not reveal any metastatic disease.  There is no evidence of recurrence of the face.  He continues to have occasional delays.  His last treatment was on March 8. due to worsening diarrhea, I will hold nivolumab this week and investigate further.  I wanted to see him back in 1 week prior to possibly resume nivolumab, but his daughter is unable to bring him until the week of April 29.    Thrombocytopenia (HCC) Mild thrombocytopenia, which has fluctuated up and down.    The patient understands the plans discussed today and is in agreement with them.  He knows to contact our office if he develops concerns prior to his next appointment.     Kevin Perl, PA-C  [facility], [place] AT [facility] [address] [place] [address] Dept: [phone] Dept Fax: [phone]   Orders Placed This Encounter  Procedures   Ferritin    Standing Status:   Future    Number of Occurrences:   1    Standing Expiration Date:   02/23/2024   Iron and TIBC    Standing Status:   Future    Number of Occurrences:   1    Standing Expiration Date:   02/23/2024   Folate    Standing Status:   Future    Number of Occurrences:   1    Standing Expiration Date:   02/23/2024   Vitamin B12    Standing  Status:   Future    Number of Occurrences:   1    Standing Expiration Date:   02/23/2024   Lactate dehydrogenase    Standing Status:   Future    Standing Expiration Date:   02/23/2024   Lactoferrin, Fecal, Qualitative    Standing Status:   Future    Number of Occurrences:   1    Standing Expiration Date:   02/23/2024   CBC and differential    This external order was created through the Results Console.   CBC    This external order was created through the Results Console.      CHIEF COMPLAINT:  CC: Melanoma of the right cheek  Current Treatment: Nivolumab every 4 weeks  HISTORY OF PRESENT ILLNESS:    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  01/24/2015 Cancer Staging   Staging form: Melanoma of the Skin, AJCC 8th Edition - Clinical stage from 01/24/2015: Stage IIB (cT3b, cN0, cM0) - Signed by Dellia Beckwith, MD on 08/19/2021 Histopathologic type: Malignant melanoma, NOS (except juvenile melanoma [id number]) Stage prefix: Initial diagnosis Laterality: Right Lymph-vascular invasion (LVI): LVI not present (absent)/not identified Diagnostic confirmation: Positive histology Specimen type: Excision Staged by: Managing physician Mitotic count: 10 Mitotic unit: mm2 Clark's level: Level IV Tumor-infiltrating lymphocytes: Unknown Neurotropism: Absent Presence of extranodal extension: Absent Breslow depth (mm): 40 Ulceration of the epidermis: Yes Microsatellites: No Primary tumor regression: Unknown Sentinel lymph node biopsy performed: No Matted nodes: No Prognostic indicators: Wide excision negative Stage used in treatment planning: Yes National guidelines used in treatment planning: Yes Type of national guideline used in treatment planning: NCCN   07/18/2020 - 06/18/2022 Chemotherapy   Patient is on Treatment Plan : MELANOMA Nivolumab + B12 q28d     07/18/2020 -  Chemotherapy   Patient is on Treatment Plan : MELANOMA Nivolumab (480) q28d     07/29/2020 Initial Diagnosis   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 (HCC)       INTERVAL HISTORY:  Kevin [name] is here today for repeat clinical assessment prior to another cycle of nivolumab.  He reports increased diarrhea was 7 stools daily over the past 2 weeks as compared to 2-3 a day previously.  He denies abdominal pain.  He reports fatigue relieved with rest.  He denies fevers or chills. He denies pain. His appetite is good. His weight has increased 6 pounds over last month .  He denies itching or skin rashes.  He denies cough or shortness of breath.  REVIEW OF SYSTEMS:  Review of Systems  Constitutional:  Positive  for fatigue. Negative for appetite change, chills, fever and unexpected weight change.  HENT:   Negative for lump/mass, mouth sores and sore throat.   Respiratory:  Negative for cough and shortness of breath.   Cardiovascular:  Negative for chest pain and leg swelling.  Gastrointestinal:  Positive for diarrhea. Negative for abdominal pain, constipation, nausea and vomiting.  Genitourinary:  Negative for difficulty urinating, dysuria, frequency and hematuria.   Musculoskeletal:  Negative for arthralgias, back pain and myalgias.  Skin:  Negative for itching, rash and wound.  Neurological:  Negative for dizziness, extremity weakness, headaches, light-headedness and numbness.  Hematological:  Negative for adenopathy.  Psychiatric/Behavioral:  Negative for depression and sleep disturbance. The patient is not nervous/anxious.      VITALS:  Blood pressure (!) 124/58, pulse 68, temperature 98.2 F (36.8 C), temperature source Oral, resp. rate 20, height 5\' 7"  (1.702 m), weight  163 lb 8 oz (74.2 kg), SpO2 97 %.  Wt Readings from Last 3 Encounters:  02/23/23 163 lb 8 oz (74.2 kg)  01/22/23 156 lb (70.8 kg)  01/16/23 161 lb (73 kg)    Body mass index is 25.61 kg/m.  Performance status (ECOG): 1 - Symptomatic but completely ambulatory  PHYSICAL EXAM:  Physical Exam Vitals and nursing note reviewed.  Constitutional:      General: He is not in acute distress.    Appearance: Normal appearance. He is normal weight.  HENT:     Head: Normocephalic and atraumatic.     Mouth/Throat:     Mouth: Mucous membranes are moist.     Pharynx: Oropharynx is clear. No oropharyngeal exudate or posterior oropharyngeal erythema.  Eyes:     General: No scleral icterus.    Extraocular Movements: Extraocular movements intact.     Conjunctiva/sclera: Conjunctivae normal.     Pupils: Pupils are equal, round, and reactive to light.  Cardiovascular:     Rate and Rhythm: Normal rate and regular rhythm.     Heart  sounds: Normal heart sounds. No murmur heard.    No friction rub. No gallop.  Pulmonary:     Effort: Pulmonary effort is normal.     Breath sounds: Normal breath sounds. No wheezing, rhonchi or rales.  Abdominal:     General: Bowel sounds are normal. There is no distension.     Palpations: Abdomen is soft. There is no hepatomegaly, splenomegaly or mass.     Tenderness: There is no abdominal tenderness.  Musculoskeletal:        General: Normal range of motion.     Cervical back: Normal range of motion and neck supple. No tenderness.     Right lower leg: Edema (1-2+) present.     Left lower leg: Edema (1-2+) present.  Lymphadenopathy:     Cervical: No cervical adenopathy.     Upper Body:     Right upper body: No supraclavicular or axillary adenopathy.     Left upper body: No supraclavicular or axillary adenopathy.  Skin:    General: Skin is warm and dry.     Coloration: Skin is not jaundiced.     Findings: No rash.  Neurological:     Mental Status: He is alert and oriented to person, place, and time.     Cranial Nerves: No cranial nerve deficit.  Psychiatric:        Mood and Affect: Mood normal.        Behavior: Behavior normal.        Thought Content: Thought content normal.     LABS:      Latest Ref Rng & Units 02/23/2023   12:00 AM 01/13/2023   10:24 AM 12/25/2022   12:00 AM  CBC  WBC  4.0     4.0  4.6      Hemoglobin 13.5 - 17.5 9.9     10.1  10.5      Hematocrit 41 - 53 31     32.6  32      Platelets 150 - 400 K/uL 119     117  127         This result is from an external source.      Latest Ref Rng & Units 02/23/2023    2:16 PM 01/13/2023   10:24 AM 12/25/2022   12:00 AM  CMP  Glucose 70 - 99 mg/dL 161  096    BUN 8 -  23 mg/dL 33  30  31      Creatinine 0.61 - 1.24 mg/dL 2.42  3.53  1.5      Sodium 135 - 145 mmol/L 141  141  141      Potassium 3.5 - 5.1 mmol/L 4.5  3.9  4.7      Chloride 98 - 111 mmol/L 108  108  109      CO2 22 - 32 mmol/L 25  24  24        Calcium 8.9 - 10.3 mg/dL 9.2  9.0  9.2      Total Protein 6.5 - 8.1 g/dL 7.1  6.9    Total Bilirubin 0.3 - 1.2 mg/dL 0.7  0.5    Alkaline Phos 38 - 126 U/L 101  100  76      AST 15 - 41 U/L 19  18  20       ALT 0 - 44 U/L 21  19  17          This result is from an external source.    Lab Results  Component Value Date   TIBC 337 02/23/2023   TIBC 321 09/24/2022   TIBC 356 05/21/2022   FERRITIN 64 02/23/2023   FERRITIN 108 09/24/2022   FERRITIN 46 05/21/2022   IRONPCTSAT 11 (L) 02/23/2023   IRONPCTSAT 16 (L) 09/24/2022   IRONPCTSAT 10 (L) 05/21/2022   Lab Results  Component Value Date   LDH 132 02/23/2023   LDH 108 09/24/2022    STUDIES:  No results found.    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 02/01/2023   Iron deficiency anemia 05/21/2022   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)    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)    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)    Thrombocytopenia 07/29/2022    Past Surgical History:  Procedure Laterality Date   APPENDECTOMY     CARDIAC CATHETERIZATION     CORONARY ANGIOPLASTY     PACEMAKER INSERTION     RIGHT/LEFT HEART CATH AND CORONARY ANGIOGRAPHY N/A 10/09/2021   Procedure: RIGHT/LEFT HEART CATH AND CORONARY ANGIOGRAPHY;  Surgeon: Elder Negus, MD;  Location: [facility] INVASIVE CV LAB;  Service: Cardiovascular;  Laterality: N/A;    Family History  Problem Relation Age of Onset   Breast cancer Mother    Diabetes Mellitus II Father    Ovarian cancer Sister    Cancer  Brother    Cancer Brother    Cancer Maternal Uncle    Breast cancer Niece     Social History:  reports that he has been smoking cigarettes. He has a 30.00 pack-year smoking history. He has never used smokeless tobacco. He reports that he does not currently use alcohol. He reports that he does not currently use drugs.The patient is alone today.  Allergies: No Known Allergies  Current Medications: Current Outpatient Medications  Medication Sig Dispense Refill   albuterol (PROVENTIL) (2.5 MG/3ML) 0.083% nebulizer solution Take by nebulization.     albuterol (VENTOLIN HFA) 108 (90 Base) MCG/ACT inhaler  Inhale into the lungs.     ALBUTEROL SULFATE PO Take by mouth.     amiodarone (PACERONE) 200 MG tablet Take 0.5 tablets (100 mg total) by mouth daily. 30 tablet 5   apixaban (ELIQUIS) 5 MG TABS tablet Take 1 tablet (5 mg total) by mouth 2 (two) times daily. 180 tablet 3   atorvastatin (LIPITOR) 80 MG tablet Take 1 tablet (80 mg total) by mouth at bedtime. 90 tablet 3   Fe Fum-FA-B Cmp-C-Zn-Mg-Mn-Cu (HEMOCYTE PLUS) 106-1 MG CAPS Take 1 capsule by mouth daily.     furosemide (LASIX) 40 MG tablet Take 1 tablet (40 mg total) by mouth every other day. 90 tablet 1   magnesium oxide (MAG-OX) 400 (240 Mg) MG tablet TAKE 1 TABLET BY MOUTH TWICE A DAY 180 tablet 1   metFORMIN (GLUCOPHAGE) 500 MG tablet Take 500 mg by mouth 2 (two) times daily.     metoprolol succinate (TOPROL-XL) 25 MG 24 hr tablet Take 0.5 tablets (12.5 mg total) by mouth daily. 45 tablet 3   ondansetron (ZOFRAN-ODT) 4 MG disintegrating tablet Take 1 tablet by mouth daily as needed.     spironolactone (ALDACTONE) 25 MG tablet Take 1 tablet (25 mg total) by mouth every morning. 30 tablet 2   SYMBICORT 80-4.5 MCG/ACT inhaler Inhale into the lungs.     tamsulosin (FLOMAX) 0.4 MG CAPS capsule Take by mouth.     TRUE METRIX BLOOD GLUCOSE TEST test strip      TRUEplus Lancets 33G MISC      No current facility-administered medications for  this visit.

## 2023-02-23 ENCOUNTER — Encounter: Payer: Self-pay | Admitting: Hematology and Oncology

## 2023-02-23 ENCOUNTER — Inpatient Hospital Stay (INDEPENDENT_AMBULATORY_CARE_PROVIDER_SITE_OTHER): Payer: Medicare PPO | Admitting: Hematology and Oncology

## 2023-02-23 ENCOUNTER — Inpatient Hospital Stay: Payer: Medicare PPO

## 2023-02-23 ENCOUNTER — Telehealth: Payer: Self-pay | Admitting: Hematology and Oncology

## 2023-02-23 ENCOUNTER — Encounter: Payer: Self-pay | Admitting: Oncology

## 2023-02-23 VITALS — BP 124/58 | HR 68 | Temp 98.2°F | Resp 20 | Ht 67.0 in | Wt 163.5 lb

## 2023-02-23 DIAGNOSIS — D696 Thrombocytopenia, unspecified: Secondary | ICD-10-CM | POA: Diagnosis not present

## 2023-02-23 DIAGNOSIS — D509 Iron deficiency anemia, unspecified: Secondary | ICD-10-CM

## 2023-02-23 DIAGNOSIS — C4339 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: Secondary | ICD-10-CM

## 2023-02-23 DIAGNOSIS — R197 Diarrhea, unspecified: Secondary | ICD-10-CM

## 2023-02-23 DIAGNOSIS — Z79899 Other long term (current) drug therapy: Secondary | ICD-10-CM | POA: Diagnosis not present

## 2023-02-23 DIAGNOSIS — D649 Anemia, unspecified: Secondary | ICD-10-CM

## 2023-02-23 DIAGNOSIS — A0472 Enterocolitis due to Clostridium difficile, not specified as recurrent: Secondary | ICD-10-CM | POA: Diagnosis not present

## 2023-02-23 DIAGNOSIS — E611 Iron deficiency: Secondary | ICD-10-CM | POA: Diagnosis not present

## 2023-02-23 LAB — CMP (CANCER CENTER ONLY)
ALT: 21 U/L (ref 0–44)
AST: 19 U/L (ref 15–41)
Albumin: 4 g/dL (ref 3.5–5.0)
Alkaline Phosphatase: 101 U/L (ref 38–126)
Anion gap: 8 (ref 5–15)
BUN: 33 mg/dL — ABNORMAL HIGH (ref 8–23)
CO2: 25 mmol/L (ref 22–32)
Calcium: 9.2 mg/dL (ref 8.9–10.3)
Chloride: 108 mmol/L (ref 98–111)
Creatinine: 1.6 mg/dL — ABNORMAL HIGH (ref 0.61–1.24)
GFR, Estimated: 43 mL/min — ABNORMAL LOW (ref >60)
Glucose, Bld: 157 mg/dL — ABNORMAL HIGH (ref 70–99)
Potassium: 4.5 mmol/L (ref 3.5–5.1)
Sodium: 141 mmol/L (ref 135–145)
Total Bilirubin: 0.7 mg/dL (ref 0.3–1.2)
Total Protein: 7.1 g/dL (ref 6.5–8.1)

## 2023-02-23 LAB — CBC AND DIFFERENTIAL
HCT: 31 — AB (ref 41–53)
Hemoglobin: 9.9 — AB (ref 13.5–17.5)
MCV: 96 — AB (ref 80–94)
Neutrophils Absolute: 3.12
Platelets: 119 10*3/uL — AB (ref 150–400)
WBC: 4

## 2023-02-23 LAB — IRON AND TIBC
Iron: 36 ug/dL — ABNORMAL LOW (ref 45–182)
Saturation Ratios: 11 % — ABNORMAL LOW (ref 17.9–39.5)
TIBC: 337 ug/dL (ref 250–450)
UIBC: 301 ug/dL

## 2023-02-23 LAB — FOLATE: Folate: 8.3 ng/mL (ref >5.9)

## 2023-02-23 LAB — VITAMIN B12: Vitamin B-12: 475 pg/mL (ref 180–914)

## 2023-02-23 LAB — LACTATE DEHYDROGENASE: LDH: 132 U/L (ref 98–192)

## 2023-02-23 LAB — CBC: RBC: 3.19 — AB (ref 3.87–5.11)

## 2023-02-23 LAB — FERRITIN: Ferritin: 64 ng/mL (ref 24–336)

## 2023-02-23 LAB — TSH: TSH: 3.618 u[IU]/mL (ref 0.350–4.500)

## 2023-02-23 LAB — CBC W DIFFERENTIAL (~~LOC~~ CC SCANNED REPORT)

## 2023-02-23 NOTE — Assessment & Plan Note (Addendum)
Worsening diarrhea with double the amount of stools from previous.  This may be due to infection or immune mediated colitis.  Stool studies are pending from today.  We will hold his nivolumab this week and plan to see him back in 1 week.  If there is no infection, we will likely need to start him on high-dose prednisone.  GI panel was negative.  C. difficile and stool for WBCs could not be completed as the stool was formed.

## 2023-02-23 NOTE — Assessment & Plan Note (Addendum)
Worsening anemia. He previously had both iron and B12 deficiency.  He last received IV iron in July.  He continues B12 injections monthly, with occasional missed doses.  I will repeat iron levels B12 and folate today, as well as check LDH.  Iron studies reveal recurrent iron deficiency with a saturation of 11%.  I will arrange for him to have IV iron again at his next visit.

## 2023-02-23 NOTE — Assessment & Plan Note (Signed)
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  He had a hypermetabolic right submandibular node on PET, but no evidence of distant metastasis.  His original lesion was diagnosed in March 2016.  He underwent excision, but refused aggressive therapy.  He has been receiving palliative nivolumab since November 2016, with several interruptions. His treatment was on hold from December 2022 to April 2023 due to cardiorespiratory issues. He continues to tolerate nivolumab without difficulty.  CT imaging in August 2023 remained without evidence of metastatic disease.  However, there were increasing bilateral pleural effusions and small volume abdominal pelvic ascites, with stable pericardial effusion, felt to be most likely due to fluid overload.  He is on Entresto.    He had delay in his treatment from November to January due to family issues.  CT imaging in February did not reveal any metastatic disease.  There is no evidence of recurrence of the face.  He continues to have occasional delays.  His last treatment was on March 8. due to worsening diarrhea, I will hold nivolumab this week and investigate further.  I wanted to see him back in 1 week prior to possibly resume nivolumab, but his daughter is unable to bring him until the week of April 29.

## 2023-02-23 NOTE — Telephone Encounter (Signed)
02/23/23 Spoke with patients daughter and scheduled next appt.She will not be able to bring him back until week of 03/09/23

## 2023-02-23 NOTE — Assessment & Plan Note (Signed)
Mild thrombocytopenia, which has fluctuated up and down.

## 2023-02-24 ENCOUNTER — Other Ambulatory Visit: Payer: Self-pay

## 2023-02-24 ENCOUNTER — Ambulatory Visit: Payer: Medicare PPO | Admitting: Cardiology

## 2023-02-24 ENCOUNTER — Encounter: Payer: Self-pay | Admitting: Oncology

## 2023-02-24 ENCOUNTER — Other Ambulatory Visit: Payer: Medicare PPO

## 2023-02-24 ENCOUNTER — Other Ambulatory Visit: Payer: Self-pay | Admitting: Pharmacist

## 2023-02-24 ENCOUNTER — Telehealth: Payer: Self-pay

## 2023-02-24 DIAGNOSIS — I5022 Chronic systolic (congestive) heart failure: Secondary | ICD-10-CM

## 2023-02-24 DIAGNOSIS — C4339 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: Secondary | ICD-10-CM

## 2023-02-24 LAB — GASTROINTESTINAL PANEL BY PCR, STOOL (REPLACES STOOL CULTURE)

## 2023-02-24 LAB — T4: T4, Total: 9.9 ug/dL (ref 4.5–12.0)

## 2023-02-24 NOTE — Telephone Encounter (Signed)
Patient is not compliant with daily BP checks. Unable to reach patient prior to get him a scale.  30-d summary Systolic mmHg -- 110.3 (109.0 - 125.0) Diastolic mmHg -- 15.9 (56.0 - 45.8) Heart Rate bpm -- 68.8 (62.0 - 79.0)  02/22/23 11:51 PM 119 / 56 79     02/21/23 10:06 PM 122 / 72 66     02/20/23 10:20 PM 115 / 56 62    02/16/23 5:11 PM 109 / 60 63     02/09/23 8:57 PM 115 / 49 66     02/07/23 5:32 PM 125 / 65 71 02/05/23 8:45 PM 116 / 71 70     01/31/23 10:14 PM 116 / 63 77

## 2023-02-25 ENCOUNTER — Other Ambulatory Visit: Payer: Medicare PPO

## 2023-02-26 ENCOUNTER — Encounter: Payer: Self-pay | Admitting: Hematology and Oncology

## 2023-02-26 LAB — CBC AND DIFFERENTIAL
HCT: 30 — AB (ref 41–53)
Hemoglobin: 9.5 — AB (ref 13.5–17.5)
Neutrophils Absolute: 3.2
Platelets: 130 10*3/uL — AB (ref 150–400)
WBC: 4.2

## 2023-02-26 LAB — IRON,TIBC AND FERRITIN PANEL
%SAT: 15
Ferritin: 118
Iron: 44
TIBC: 295
UIBC: 251

## 2023-02-26 LAB — LIPID PANEL: LDL Cholesterol: 153

## 2023-02-26 LAB — VITAMIN B12: Vitamin B-12: 528

## 2023-02-26 LAB — CBC: RBC: 3.03 — AB (ref 3.87–5.11)

## 2023-02-27 ENCOUNTER — Ambulatory Visit: Payer: Medicare PPO

## 2023-02-28 ENCOUNTER — Encounter: Payer: Self-pay | Admitting: Oncology

## 2023-02-28 LAB — CMP14+EGFR
ALT: 20 IU/L (ref 0–44)
AST: 20 IU/L (ref 0–40)
Albumin/Globulin Ratio: 1.9 (ref 1.2–2.2)
Albumin: 4.3 g/dL (ref 3.7–4.7)
Alkaline Phosphatase: 122 IU/L — ABNORMAL HIGH (ref 44–121)
BUN/Creatinine Ratio: 19 (ref 10–24)
BUN: 32 mg/dL — ABNORMAL HIGH (ref 8–27)
Bilirubin Total: 0.4 mg/dL (ref 0.0–1.2)
CO2: 20 mmol/L (ref 20–29)
Calcium: 9.4 mg/dL (ref 8.6–10.2)
Chloride: 108 mmol/L — ABNORMAL HIGH (ref 96–106)
Creatinine, Ser: 1.68 mg/dL — ABNORMAL HIGH (ref 0.76–1.27)
Globulin, Total: 2.3 g/dL (ref 1.5–4.5)
Glucose: 140 mg/dL — ABNORMAL HIGH (ref 70–99)
Potassium: 4.8 mmol/L (ref 3.5–5.2)
Sodium: 146 mmol/L — ABNORMAL HIGH (ref 134–144)
Total Protein: 6.6 g/dL (ref 6.0–8.5)
eGFR: 41 mL/min/{1.73_m2} — ABNORMAL LOW (ref >59)

## 2023-02-28 LAB — LIPID PANEL WITH LDL/HDL RATIO
Cholesterol, Total: 107 mg/dL (ref 100–199)
HDL: 60 mg/dL (ref >39)
LDL Chol Calc (NIH): 35 mg/dL (ref 0–99)
LDL/HDL Ratio: 0.6 ratio (ref 0.0–3.6)
Triglycerides: 49 mg/dL (ref 0–149)
VLDL Cholesterol Cal: 12 mg/dL (ref 5–40)

## 2023-02-28 LAB — BRAIN NATRIURETIC PEPTIDE: BNP: 1423.8 pg/mL — ABNORMAL HIGH (ref 0.0–100.0)

## 2023-03-03 NOTE — Progress Notes (Signed)
I have left messages with the patient to call us back if symptoms of dyspnea or leg edema is not improving.

## 2023-03-05 ENCOUNTER — Other Ambulatory Visit: Payer: Self-pay | Admitting: Hematology and Oncology

## 2023-03-05 DIAGNOSIS — C4339 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: Secondary | ICD-10-CM

## 2023-03-09 ENCOUNTER — Inpatient Hospital Stay: Payer: Medicare PPO

## 2023-03-09 ENCOUNTER — Inpatient Hospital Stay (INDEPENDENT_AMBULATORY_CARE_PROVIDER_SITE_OTHER): Payer: Medicare PPO | Admitting: Hematology and Oncology

## 2023-03-09 ENCOUNTER — Encounter: Payer: Self-pay | Admitting: Hematology and Oncology

## 2023-03-09 VITALS — BP 130/58 | HR 66 | Temp 98.5°F | Resp 18 | Ht 67.0 in | Wt 164.6 lb

## 2023-03-09 DIAGNOSIS — D509 Iron deficiency anemia, unspecified: Secondary | ICD-10-CM | POA: Diagnosis not present

## 2023-03-09 DIAGNOSIS — C4339 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: Secondary | ICD-10-CM

## 2023-03-09 DIAGNOSIS — A0472 Enterocolitis due to Clostridium difficile, not specified as recurrent: Secondary | ICD-10-CM

## 2023-03-09 LAB — CBC WITH DIFFERENTIAL (CANCER CENTER ONLY)
Abs Immature Granulocytes: 0.02 10*3/uL (ref 0.00–0.07)
Basophils Absolute: 0 10*3/uL (ref 0.0–0.1)
Basophils Relative: 1 %
Eosinophils Absolute: 0.1 10*3/uL (ref 0.0–0.5)
Eosinophils Relative: 2 %
HCT: 32.5 % — ABNORMAL LOW (ref 39.0–52.0)
Hemoglobin: 9.8 g/dL — ABNORMAL LOW (ref 13.0–17.0)
Immature Granulocytes: 1 %
Lymphocytes Relative: 13 %
Lymphs Abs: 0.4 10*3/uL — ABNORMAL LOW (ref 0.7–4.0)
MCH: 30.7 pg (ref 26.0–34.0)
MCHC: 30.2 g/dL (ref 30.0–36.0)
MCV: 101.9 fL — ABNORMAL HIGH (ref 80.0–100.0)
Monocytes Absolute: 0.4 10*3/uL (ref 0.1–1.0)
Monocytes Relative: 12 %
Neutro Abs: 2.5 10*3/uL (ref 1.7–7.7)
Neutrophils Relative %: 71 %
Platelet Count: 120 10*3/uL — ABNORMAL LOW (ref 150–400)
RBC: 3.19 MIL/uL — ABNORMAL LOW (ref 4.22–5.81)
RDW: 15.2 % (ref 11.5–15.5)
WBC Count: 3.5 10*3/uL — ABNORMAL LOW (ref 4.0–10.5)
nRBC: 0 % (ref 0.0–0.2)

## 2023-03-09 LAB — CMP (CANCER CENTER ONLY)
ALT: 23 U/L (ref 0–44)
AST: 18 U/L (ref 15–41)
Albumin: 4 g/dL (ref 3.5–5.0)
Alkaline Phosphatase: 89 U/L (ref 38–126)
Anion gap: 9 (ref 5–15)
BUN: 47 mg/dL — ABNORMAL HIGH (ref 8–23)
CO2: 26 mmol/L (ref 22–32)
Calcium: 9 mg/dL (ref 8.9–10.3)
Chloride: 105 mmol/L (ref 98–111)
Creatinine: 1.94 mg/dL — ABNORMAL HIGH (ref 0.61–1.24)
GFR, Estimated: 34 mL/min — ABNORMAL LOW (ref >60)
Glucose, Bld: 152 mg/dL — ABNORMAL HIGH (ref 70–99)
Potassium: 4.3 mmol/L (ref 3.5–5.1)
Sodium: 140 mmol/L (ref 135–145)
Total Bilirubin: 0.8 mg/dL (ref 0.3–1.2)
Total Protein: 7.1 g/dL (ref 6.5–8.1)

## 2023-03-09 LAB — TSH: TSH: 4.232 u[IU]/mL (ref 0.350–4.500)

## 2023-03-09 LAB — LACTATE DEHYDROGENASE: LDH: 120 U/L (ref 98–192)

## 2023-03-09 LAB — FOLATE
Folate: 9.9
Thyroxine (T4): 9.1

## 2023-03-09 MED ORDER — FIDAXOMICIN 200 MG PO TABS: 200.0000 mg | ORAL_TABLET | Freq: Two times a day (BID) | ORAL | 0 refills | Status: DC

## 2023-03-09 NOTE — Progress Notes (Unsigned)
[facility] ([facility]  [address] [place],  [place]  [address] [phone]  Clinic Day:  03/09/2023  Referring physician: Lonie Peak, PA-C  ASSESSMENT & PLAN:   Assessment & Plan: C. difficile diarrhea The patient had severe diarrhea at his visit 2 weeks ago, but was unable to give a stool specimen for testing.  He was unable to return until today due to transportation issues.  His stool was positive for C. difficile.  I contacted his pharmacy regarding Dificid and this can be ordered dispensed to the patient tomorrow, so he will take Dificid 200 mg twice daily for 10 days.  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  He had a hypermetabolic right submandibular node on PET, but no evidence of distant metastasis.  His original lesion was diagnosed in March 2016.  He underwent excision, but refused aggressive therapy.  He has been receiving palliative nivolumab since November 2016, with several interruptions. His treatment was on hold from December 2022 to April 2023 due to cardiorespiratory issues. He continues to tolerate nivolumab without difficulty.  CT imaging in August 2023 remained without evidence of metastatic disease.  However, there were increasing bilateral pleural effusions and small volume abdominal pelvic ascites, with stable pericardial effusion, felt to be most likely due to fluid overload.  He is on Entresto.    He had delay in his treatment from November to January due to family issues.  CT imaging in February did not reveal any metastatic disease.  There is no evidence of recurrence of the face.  He continues to have occasional delays.  His last treatment was on March 8.  He had severe diarrhea 2 weeks ago and was unable to give a stool specimen, so nivolumab was held.  He now has documented C. difficile infection.  Iron deficiency anemia He had worsening anemia 2 weeks ago. He  previously had both iron and B12 deficiency.  He last received IV iron in July.  He continues B12 injections monthly, with occasional missed doses.  Iron studies revealed recurrent iron deficiency with a saturation of 11%.  Will plan to give him IV iron again at his next infusion.      The patient understands the plans discussed today and is in agreement with them.  He knows to contact our office if he develops concerns prior to his next appointment.   I provided *** minutes of face-to-face time during this encounter and > 50% was spent counseling as documented under my assessment and plan.    Adah Perl, PA-C  [facility] AT [facility] [address] [place] [address] Dept: [phone] Dept Fax: [phone]   No orders of the defined types were placed in this encounter.     CHIEF COMPLAINT:  CC: Recurrent malignant melanoma of the face  Current Treatment: Maintenance nivolumab every 4 weeks  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 (HCC)  01/24/2015 Cancer Staging   Staging form: Melanoma of the Skin, AJCC 8th Edition - Clinical stage from 01/24/2015: Stage IIB (cT3b, cN0, cM0) - Signed by Dellia Beckwith, MD on 08/19/2021 Histopathologic type: Malignant melanoma, NOS (except juvenile melanoma [id number]) Stage prefix: Initial diagnosis Laterality: Right Lymph-vascular invasion (LVI): LVI not present (absent)/not identified Diagnostic confirmation: Positive histology Specimen type: Excision Staged by: Managing physician Mitotic count: 10 Mitotic unit:  mm2 Clark's level: Level IV Tumor-infiltrating lymphocytes: Unknown Neurotropism: Absent Presence of extranodal extension: Absent Breslow depth (mm): 40 Ulceration of the epidermis: Yes Microsatellites: No Primary tumor regression: Unknown Sentinel lymph node biopsy performed: No Matted nodes: No Prognostic  indicators: Wide excision negative Stage used in treatment planning: Yes National guidelines used in treatment planning: Yes Type of national guideline used in treatment planning: NCCN   07/18/2020 - 06/18/2022 Chemotherapy   Patient is on Treatment Plan : MELANOMA Nivolumab + B12 q28d     07/18/2020 -  Chemotherapy   Patient is on Treatment Plan : MELANOMA Nivolumab (480) q28d     07/29/2020 Initial Diagnosis   Malignant melanoma of other parts of face (HCC)       INTERVAL HISTORY:  [facility] is here today for repeat clinical assessment prior to resuming nivolumab.  This was placed on hold 2 weeks ago due to severe diarrhea.  Unfortunately, at that time he was unable to give a stool specimen and was not able to return to clinic until today due to transportation issues.  He states his diarrhea persists, but he only has 2-3 episodes a day, which is his baseline.  He denies abdominal pain.  He denies fevers or chills. He denies pain. His appetite is good. His weight has been stable.  REVIEW OF SYSTEMS:  Review of Systems  Constitutional:  Positive for fatigue. Negative for appetite change, chills, fever and unexpected weight change.  HENT:   Negative for lump/mass, mouth sores and sore throat.   Respiratory:  Negative for cough and shortness of breath.   Cardiovascular:  Negative for chest pain and leg swelling.  Gastrointestinal:  Positive for diarrhea. Negative for abdominal pain, constipation, nausea and vomiting.  Genitourinary:  Negative for difficulty urinating, dysuria, frequency and hematuria.   Musculoskeletal:  Negative for arthralgias, back pain and myalgias.  Skin:  Negative for itching, rash and wound.  Neurological:  Negative for dizziness, extremity weakness, headaches, light-headedness and numbness.  Hematological:  Negative for adenopathy.  Psychiatric/Behavioral:  Negative for depression and sleep disturbance. The patient is not nervous/anxious.      VITALS:  Blood pressure  (!) 130/58, pulse 66, temperature 98.5 F (36.9 C), temperature source Oral, resp. rate 18, height 5\' 7"  (1.702 m), weight 164 lb 9.6 oz (74.7 kg), SpO2 100 %.  Wt Readings from Last 3 Encounters:  03/09/23 164 lb 9.6 oz (74.7 kg)  02/23/23 163 lb 8 oz (74.2 kg)  01/22/23 156 lb (70.8 kg)    Body mass index is 25.78 kg/m.  Performance status (ECOG): 1 - Symptomatic but completely ambulatory  PHYSICAL EXAM:  Physical Exam Vitals and nursing note reviewed.  Constitutional:      General: He is not in acute distress.    Appearance: Normal appearance. He is normal weight.  HENT:     Head: Normocephalic and atraumatic.     Mouth/Throat:     Mouth: Mucous membranes are moist.     Pharynx: Oropharynx is clear. No oropharyngeal exudate or posterior oropharyngeal erythema.  Eyes:     General: No scleral icterus.    Extraocular Movements: Extraocular movements intact.     Conjunctiva/sclera: Conjunctivae normal.     Pupils: Pupils are equal, round, and reactive to light.  Cardiovascular:     Rate and Rhythm: Normal rate and regular rhythm.     Heart sounds: Normal heart sounds. No murmur heard.    No friction rub. No gallop.  Pulmonary:  Effort: Pulmonary effort is normal.     Breath sounds: Normal breath sounds. No wheezing, rhonchi or rales.  Abdominal:     General: Bowel sounds are normal. There is no distension.     Palpations: Abdomen is soft. There is no hepatomegaly, splenomegaly or mass.     Tenderness: There is no abdominal tenderness.  Musculoskeletal:        General: Normal range of motion.     Cervical back: Normal range of motion and neck supple. No tenderness.     Right lower leg: No edema.     Left lower leg: No edema.  Lymphadenopathy:     Cervical: No cervical adenopathy.     Upper Body:     Right upper body: No supraclavicular or axillary adenopathy.     Left upper body: No supraclavicular or axillary adenopathy.     Lower Body: No right inguinal adenopathy.  No left inguinal adenopathy.  Skin:    General: Skin is warm and dry.     Coloration: Skin is not jaundiced.     Findings: No rash.  Neurological:     Mental Status: He is alert and oriented to person, place, and time.     Cranial Nerves: No cranial nerve deficit.  Psychiatric:        Mood and Affect: Mood normal.        Behavior: Behavior normal.        Thought Content: Thought content normal.     LABS:      Latest Ref Rng & Units 02/26/2023   12:00 AM 02/23/2023   12:00 AM 01/13/2023   10:24 AM  CBC  WBC  4.2     4.0     4.0   Hemoglobin 13.5 - 17.5 9.5     9.9     10.1   Hematocrit 41 - 53 30     31     32.6   Platelets 150 - 400 K/uL 130     119     117      This result is from an external source.      Latest Ref Rng & Units 02/26/2023    2:24 PM 02/23/2023    2:16 PM 01/13/2023   10:24 AM  CMP  Glucose 70 - 99 mg/dL 829  562  130   BUN 8 - 27 mg/dL 32  33  30   Creatinine 0.76 - 1.27 mg/dL 8.65  7.84  6.96   Sodium 134 - 144 mmol/L 146  141  141   Potassium 3.5 - 5.2 mmol/L 4.8  4.5  3.9   Chloride 96 - 106 mmol/L 108  108  108   CO2 20 - 29 mmol/L 20  25  24    Calcium 8.6 - 10.2 mg/dL 9.4  9.2  9.0   Total Protein 6.0 - 8.5 g/dL 6.6  7.1  6.9   Total Bilirubin 0.0 - 1.2 mg/dL 0.4  0.7  0.5   Alkaline Phos 44 - 121 IU/L 122  101  100   AST 0 - 40 IU/L 20  19  18    ALT 0 - 44 IU/L 20  21  19       No results found for: "CEA1", "CEA" / No results found for: "CEA1", "CEA" No results found for: "PSA1" No results found for: "EXB284" No results found for: "CAN125"  No results found for: "TOTALPROTELP", "ALBUMINELP", "A1GS", "A2GS", "BETS", "BETA2SER", "GAMS", "MSPIKE", "SPEI" Lab Results  Component  Value Date   TIBC 295 02/26/2023   TIBC 337 02/23/2023   TIBC 321 09/24/2022   FERRITIN 118 02/26/2023   FERRITIN 64 02/23/2023   FERRITIN 108 09/24/2022   IRONPCTSAT 15 02/26/2023   IRONPCTSAT 11 (L) 02/23/2023   IRONPCTSAT 16 (L) 09/24/2022   Lab Results   Component Value Date   LDH 132 02/23/2023   LDH 108 09/24/2022    STUDIES:  No results found.    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)    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)    Thrombocytopenia (HCC) 07/29/2022    Past Surgical History:  Procedure Laterality Date   APPENDECTOMY     CARDIAC CATHETERIZATION     CORONARY ANGIOPLASTY     PACEMAKER INSERTION     RIGHT/LEFT HEART CATH AND CORONARY ANGIOGRAPHY N/A 10/09/2021   Procedure: RIGHT/LEFT HEART CATH AND CORONARY ANGIOGRAPHY;  Surgeon: Elder Negus, MD;  Location: [facility];  Service: Cardiovascular;  Laterality: N/A;    Family History  Problem Relation Age of Onset   Breast cancer Mother    Diabetes Mellitus II Father    Ovarian cancer Sister    Cancer Brother    Cancer Brother    Cancer Maternal Uncle    Breast cancer Niece     Social History:  reports that he has been smoking cigarettes. He has a 30.00 pack-year smoking history. He has never used smokeless tobacco. He reports that he does not currently use alcohol. He reports that he does not currently use drugs.The patient is alone today.  Allergies: No Known Allergies  Current Medications: Current Outpatient Medications  Medication Sig Dispense Refill   fidaxomicin (DIFICID) 200 MG TABS tablet Take 1 tablet (200  mg total) by mouth 2 (two) times daily. 20 tablet 0   albuterol (PROVENTIL) (2.5 MG/3ML) 0.083% nebulizer solution Take by nebulization.     albuterol (VENTOLIN HFA) 108 (90 Base) MCG/ACT inhaler Inhale into the lungs.     ALBUTEROL SULFATE PO Take by mouth.     amiodarone (PACERONE) 200 MG tablet Take 0.5 tablets (100 mg total) by mouth daily. 30 tablet 5   apixaban (ELIQUIS) 5 MG TABS tablet Take 1 tablet (5 mg total) by mouth 2 (two) times daily. 180 tablet 3   atorvastatin (LIPITOR) 80 MG tablet Take 1 tablet (80 mg total) by mouth at bedtime. 90 tablet 3   Fe Fum-FA-B Cmp-C-Zn-Mg-Mn-Cu (HEMOCYTE PLUS) 106-1 MG CAPS Take 1 capsule by mouth daily.     furosemide (LASIX) 40 MG tablet Take 1 tablet (40 mg total) by mouth every other day. 90 tablet 1   magnesium oxide (MAG-OX) 400 (240 Mg) MG tablet TAKE 1 TABLET BY MOUTH TWICE A DAY 180 tablet 1   metFORMIN (GLUCOPHAGE) 500 MG tablet Take 500 mg by mouth 2 (two) times daily.  metoprolol succinate (TOPROL-XL) 25 MG 24 hr tablet Take 0.5 tablets (12.5 mg total) by mouth daily. 45 tablet 3   ondansetron (ZOFRAN-ODT) 4 MG disintegrating tablet Take 1 tablet by mouth daily as needed.     spironolactone (ALDACTONE) 25 MG tablet Take 1 tablet (25 mg total) by mouth every morning. 30 tablet 2   SYMBICORT 80-4.5 MCG/ACT inhaler Inhale into the lungs.     tamsulosin (FLOMAX) 0.4 MG CAPS capsule Take by mouth.     TRUE METRIX BLOOD GLUCOSE TEST test strip      TRUEplus Lancets 33G MISC      No current facility-administered medications for this visit.

## 2023-03-09 NOTE — Assessment & Plan Note (Signed)
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  He had a hypermetabolic right submandibular node on PET, but no evidence of distant metastasis.  His original lesion was diagnosed in March 2016.  He underwent excision, but refused aggressive therapy.  He has been receiving palliative nivolumab since November 2016, with several interruptions. His treatment was on hold from December 2022 to April 2023 due to cardiorespiratory issues. He continues to tolerate nivolumab without difficulty.  CT imaging in August 2023 remained without evidence of metastatic disease.  However, there were increasing bilateral pleural effusions and small volume abdominal pelvic ascites, with stable pericardial effusion, felt to be most likely due to fluid overload.  He is on Entresto.    He had delay in his treatment from November to January due to family issues.  CT imaging in February did not reveal any metastatic disease.  There is no evidence of recurrence of the face.  He continues to have occasional delays.  His last treatment was on March 8.  He had severe diarrhea 2 weeks ago and was unable to give a stool specimen, so nivolumab was held.  He now has documented C. difficile infection.

## 2023-03-09 NOTE — Assessment & Plan Note (Signed)
He had worsening anemia 2 weeks ago. He previously had both iron and B12 deficiency.  He last received IV iron in July.  He continues B12 injections monthly, with occasional missed doses.  Iron studies revealed recurrent iron deficiency with a saturation of 11%.  Will plan to give him IV iron again at his next infusion.

## 2023-03-09 NOTE — Assessment & Plan Note (Signed)
The patient had severe diarrhea at his visit 2 weeks ago, but was unable to give a stool specimen for testing.  He was unable to return until today due to transportation issues.  His stool was positive for C. difficile.  I contacted his pharmacy regarding Dificid and this can be ordered dispensed to the patient tomorrow, so he will take Dificid 200 mg twice daily for 10 days.

## 2023-03-10 ENCOUNTER — Encounter: Payer: Self-pay | Admitting: Hematology and Oncology

## 2023-03-10 ENCOUNTER — Telehealth: Payer: Self-pay | Admitting: Oncology

## 2023-03-10 ENCOUNTER — Encounter: Payer: Self-pay | Admitting: Oncology

## 2023-03-10 NOTE — Telephone Encounter (Signed)
Contacted pt's daughter to notify her of the following. Unable to reach via phone.    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  I tried to call his daughter to see when the best time would be but she did not answer.  They are not aware of this, if you could contact her again today.  Thank you.

## 2023-03-11 ENCOUNTER — Telehealth: Payer: Self-pay | Admitting: Oncology

## 2023-03-11 NOTE — Telephone Encounter (Signed)
Unable to reach via phone, voicemail was left.   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  I tried to call his daughter to see when the best time would be but she did not answer.  They are not aware of this, if you could contact her again today.  Thank you.

## 2023-03-12 ENCOUNTER — Telehealth: Payer: Self-pay | Admitting: Oncology

## 2023-03-12 ENCOUNTER — Ambulatory Visit: Payer: Medicare PPO | Admitting: Cardiology

## 2023-03-12 LAB — T4: T4, Total: 10.1 ug/dL (ref 4.5–12.0)

## 2023-03-12 NOTE — Telephone Encounter (Signed)
03/12/23 Spoke with daughter Alcario Drought and rescheduled appts

## 2023-03-13 ENCOUNTER — Ambulatory Visit: Payer: Medicare PPO

## 2023-03-16 DIAGNOSIS — I5021 Acute systolic (congestive) heart failure: Secondary | ICD-10-CM | POA: Diagnosis not present

## 2023-03-17 ENCOUNTER — Encounter: Payer: Self-pay | Admitting: Hematology and Oncology

## 2023-04-01 DIAGNOSIS — J449 Chronic obstructive pulmonary disease, unspecified: Secondary | ICD-10-CM | POA: Diagnosis not present

## 2023-04-01 DIAGNOSIS — J9611 Chronic respiratory failure with hypoxia: Secondary | ICD-10-CM | POA: Diagnosis not present

## 2023-04-01 DIAGNOSIS — I5040 Unspecified combined systolic (congestive) and diastolic (congestive) heart failure: Secondary | ICD-10-CM | POA: Diagnosis not present

## 2023-04-02 ENCOUNTER — Other Ambulatory Visit: Payer: Self-pay | Admitting: Cardiology

## 2023-04-02 DIAGNOSIS — I5022 Chronic systolic (congestive) heart failure: Secondary | ICD-10-CM

## 2023-04-03 NOTE — Progress Notes (Incomplete)
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  He had a hypermetabolic right submandibular node on PET, but no evidence of distant metastasis.  His original lesion was diagnosed in March 2016.  He underwent excision, but refused aggressive therapy.  He has been receiving palliative nivolumab since November 2016, with several interruptions. His treatment was on hold from December 2022 to April 2023 due to cardiorespiratory issues. He continues to tolerate nivolumab without difficulty.  CT imaging in August 2023 remains without evidence of metastatic disease.  However, there were increasing bilateral pleural effusions and small volume abdominal pelvic ascites, with stable pericardial effusion, felt to be most likely due to fluid overload.  He is on Entresto. His current CT scans show frank pulmonary edema but no evidence of metastatic or recurrent melanoma. He will need to see his cardiologist as soon as possible.    B12 deficiency anemia He had missed several B12 injections, but these were resumed and he has continued them monthly.  His hemoglobin had improved with the B12 injections, but then he was found to have be iron deficient.  His B12 level last month was good.   Iron deficiency anemia He has had chronic anemia, but in July was found to have iron deficiency, so was treated with IV iron.  His hemoglobin improved somewhat since receiving the iron but today's results are pending.   Thrombocytopenia (HCC) He is new mild thrombocytopenia of uncertain etiology.  This may be related to his treatment.  This has nearly resolved now.   Plan I advised him that he will need to see cardiologist as soon as possible to evaluate  him and maybe change his medication. He is scheduled for his Nivolumab day 1 cycle 22 on 01/16/2023. His labs today are pending. I will see him back in a month with CBC and CMP.  The patient understands the plans discussed today and is in agreement with them.  He knows to contact our office if he develops concerns prior to his next appointment.  I provided 15 minutes of face-to-face time during this this encounter and > 50% was spent counseling as documented under my assessment and plan.    [facility] AT [facility] [address] [place] [address] Dept: [phone] Dept Fax: [phone]   No orders of the defined types were placed in this encounter.   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  His original lesion was diagnosed in March 2016.  He underwent excision, but refused aggressive surgery.  The lesion was at least 4 mm thick with a Clark's level 4, and a mitotic index 10/mm.  The margins were positive, but wide reexcision revealed no residual melanoma.  He had recurrence within 2 months and the lesion had been steadily growing.  He initially had refused seeing a medical oncologist, but finally came to [place] for evaluation and treatment in October 2016.  He does have extensive comorbidities including COPD, diabetes, hypertension, coronary artery disease, history of myocardial infarction, and pacemaker with AICD.  PET  revealed hypermetabolism in the right submandibular node, but no evidence of distant metastasis.  He was having significant pain and requiring regular hydrocodone doses.  He also had severe disfigurement and avoided going out in public because of the facial lesion.  He has been receiving palliative nivolumab since November 2016 and has  had a good response with a decrease in the right facial tumor.  He has had associated hypomagnesemia and hypokalemia, so is on oral magnesium and potassium supplements. He has had mild chronic anemia, which has been stable. He was found to have B12 deficiency and continues B12 injections monthly.  CT head/neck in May 2017 revealed some nonspecific skin thickening in the right face in the area of his melanoma in the right submandibular lymph node had decreased in size, now measuring 6 x 12 mm, but still prominent when compared to the left submandibular node.  He had 15 cycles of nivolumab and relocated to continue to get the same treatment.  He had his 30th dose in February 2018 and then returned to the [place] area.     He was admitted to the hospital in early March 2018 for congestive heart failure associated with pleural and pericardial effusions.  He had no leukocytosis, fever or purulent sputum.  The echocardiogram revealed severe diffuse hypokinesis with mild concentric hypertrophy and markedly depressed ejection fraction of 20 to 25%, associated with moderate mitral regurgitation and moderate left atrial dilatation.  He improved with diuresis.  CT chest in March 2018 revealed stable cardiomegaly and stable small pericardial effusion, but no evidence of immune mediated pneumonitis or metastatic disease.  The previously seen bilateral pleural effusions had resolved.  He returned to our care and nivolumab every 2 weeks was resumed in April.  CT chest was repeated in early May after 1 month back on therapy.  This was stable, without evidence of recurrent pleural effusions, immunotherapy toxicities, or evidence of metastatic disease.  He was switched to every 28-day nivolumab in August 2018.  He has had stable disease so has continued on nivolumab.  CT imaging in October 2018 revealed a stable 5 mm level 1B cervical lymph node, as well as a stable 8 mm exophytic lesion of the right face.  CT chest did not  reveal any evidence of metastatic disease. Borderline cardiomegaly and a small amount of pericardial fluid/thickening was seen, but not reaccumulation of the pleural effusions.  CT head did not reveal any evidence of intracranial metastasis.  There were chronic ischemic changes and evidence of old infarcts.   Unfortunately, his wife passed away in July 20, 2019and she was his primary caregiver His daughters have been caring for him since then.  CT scans have remained stable, so he has continued nivolumab every 4 weeks.  He was given mirtazapine for depression and weight loss the dose was increased to 15 mg.  He was then lost to follow-up from December 2021 to April 2022, at which time he resumed nivolumab and B12 every 4 weeks.   CT head, neck and chest in June 2022 did not reveal any evidence of progressive disease.  He was admitted at [facility] in November/December 2022 due to flu and pneumonia.  Nivolumab was placed on hold in December.  CT chest, abdomen and pelvis in February did not reveal any evidence of metastatic disease.  There was new extensive tree in bud nodularity in the right lung with most significant involvement of the right lower lobe. Findings were most consistent with an infectious or  inflammatory process in a pattern suggestive of atypical infection including nontuberculous mycobacterium.  He was seen by Dr. Blenda Nicely, who recommended bronchoscopy, but the patient did not return for follow-up as he did not wish to pursue this procedure.  Nivolumab was resumed again in April.  He was last CT scans in August did not reveal obvious metastatic disease.  There was an increase in his bilateral pleural effusions, stable pericardial effusion and mild ascites, felt to most likely be due to fluid overload.  He is on Entresto and follows with cardiology.   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 (HCC)  01/24/2015 Cancer Staging   Staging form: Melanoma of the Skin, AJCC 8th  Edition - Clinical stage from 01/24/2015: Stage IIB (cT3b, cN0, cM0) - Signed by Dellia Beckwith, MD on 08/19/2021 Histopathologic type: Malignant melanoma, NOS (except juvenile melanoma [id number]) Stage prefix: Initial diagnosis Laterality: Right Lymph-vascular invasion (LVI): LVI not present (absent)/not identified Diagnostic confirmation: Positive histology Specimen type: Excision Staged by: Managing physician Mitotic count: 10 Mitotic unit: mm2 Clark's level: Level IV Tumor-infiltrating lymphocytes: Unknown Neurotropism: Absent Presence of extranodal extension: Absent Breslow depth (mm): 40 Ulceration of the epidermis: Yes Microsatellites: No Primary tumor regression: Unknown Sentinel lymph node biopsy performed: No Matted nodes: No Prognostic indicators: Wide excision negative Stage used in treatment planning: Yes National guidelines used in treatment planning: Yes Type of national guideline used in treatment planning: NCCN   07/18/2020 - 06/18/2022 Chemotherapy   Patient is on Treatment Plan : MELANOMA Nivolumab + B12 q28d     07/18/2020 -  Chemotherapy   Patient is on Treatment Plan : MELANOMA Nivolumab (480) q28d     07/29/2020 Initial Diagnosis   Malignant melanoma of other parts of face (HCC)      INTERVAL HISTORY:  Kevin [name] is here today for repeat clinical assessment for reecurrent melanoma. Patient states that he feels *** and ***.    He denies signs of infection such as sore throat, sinus drainage, cough, or urinary symptoms.  He denies fevers or recurrent chills. He denies pain. He denies nausea, vomiting, chest pain, dyspnea or cough. His appetite is *** and his weight {Weight change:10426}.   and had 1 fall last week as he is walking with a cane today. Patient also informed me that he experiences diarrhea with any fluids or food. His CT scans look good and show thickening of the skin of the right cheek with no sign of spread. However, I do see pulmonary edema. He  does experience some shortness of breath with exertion and orthopnea. Patient does use his nebulizer every morning. I advised him that he will need to see cardiologist as soon as possible to evaluate him and maybe change his medication. He is scheduled for his Nivolumab day 1 cycle 22 on 01/16/2023. His labs today are pending. I will see him back in 4 weeks with CBC and CMP and have his Nivolumab a week later. He denies signs of infection such as sore throat, sinus drainage, cough, or urinary symptoms.  He denies fevers or recurrent chills. He denies pain. He denies nausea, vomiting, chest pain, dyspnea or cough. His appetite is good and his weight has decreased 7 pounds over last 1.5 months .  REVIEW OF SYSTEMS:  Review of Systems  Constitutional: Negative.  Negative for appetite change, chills, diaphoresis, fatigue, fever and unexpected weight change.  HENT:  Negative.  Negative for hearing loss, lump/mass, mouth sores, nosebleeds, sore throat, tinnitus, trouble swallowing  and voice change.   Eyes: Negative.  Negative for eye problems and icterus.  Respiratory:  Positive for shortness of breath (with exertion and orthopnea.). Negative for chest tightness, cough, hemoptysis and wheezing.   Cardiovascular:  Positive for leg swelling. Negative for chest pain and palpitations.  Gastrointestinal:  Positive for diarrhea. Negative for abdominal distention, abdominal pain, blood in stool, constipation, nausea, rectal pain and vomiting.  Endocrine: Negative.   Genitourinary: Negative.  Negative for bladder incontinence, difficulty urinating, dyspareunia, dysuria, frequency, hematuria, nocturia, pelvic pain and penile discharge.   Musculoskeletal: Negative.  Negative for arthralgias, back pain, flank pain, gait problem, myalgias, neck pain and neck stiffness.  Skin: Negative.  Negative for itching, rash and wound.  Neurological: Negative.  Negative for dizziness, extremity weakness, gait problem, headaches,  light-headedness, numbness, seizures and speech difficulty.  Hematological:  Negative for adenopathy. Bruises/bleeds easily.  Psychiatric/Behavioral:  Positive for confusion. Negative for decreased concentration, depression, sleep disturbance and suicidal ideas. The patient is not nervous/anxious.      VITALS:  There were no vitals taken for this visit.  Wt Readings from Last 3 Encounters:  03/09/23 164 lb 9.6 oz (74.7 kg)  02/23/23 163 lb 8 oz (74.2 kg)  01/22/23 156 lb (70.8 kg)    There is no height or weight on file to calculate BMI.  Performance status (ECOG): 1 - Symptomatic but completely ambulatory  PHYSICAL EXAM:  Physical Exam Vitals and nursing note reviewed.  Constitutional:      General: He is not in acute distress.    Appearance: Normal appearance. He is normal weight. He is not ill-appearing, toxic-appearing or diaphoretic.  HENT:     Head: Normocephalic and atraumatic.     Right Ear: Tympanic membrane, ear canal and external ear normal. There is no impacted cerumen.     Left Ear: Tympanic membrane, ear canal and external ear normal. There is no impacted cerumen.     Nose: Nose normal. No congestion or rhinorrhea.     Mouth/Throat:     Mouth: Mucous membranes are moist.     Pharynx: Oropharynx is clear. No oropharyngeal exudate or posterior oropharyngeal erythema.  Eyes:     General: No scleral icterus.       Right eye: No discharge.        Left eye: No discharge.     Extraocular Movements: Extraocular movements intact.     Conjunctiva/sclera: Conjunctivae normal.     Pupils: Pupils are equal, round, and reactive to light.  Neck:     Vascular: No carotid bruit.  Cardiovascular:     Rate and Rhythm: Normal rate and regular rhythm.     Heart sounds: Normal heart sounds. No murmur heard.    No friction rub. No gallop.  Pulmonary:     Effort: Pulmonary effort is normal. No respiratory distress.     Breath sounds: No stridor. Examination of the right-lower field  reveals rales. Examination of the left-lower field reveals rales. Rales present. No decreased breath sounds, wheezing or rhonchi.     Comments: bibasilar rales Chest:     Chest wall: No tenderness.  Abdominal:     General: Bowel sounds are normal. There is no distension.     Palpations: Abdomen is soft. There is no fluid wave, hepatomegaly, splenomegaly or mass.     Tenderness: There is no abdominal tenderness. There is no right CVA tenderness, left CVA tenderness, guarding or rebound.     Hernia: No hernia is present.  Comments: Mild ascitis of the lower abdomen  Musculoskeletal:        General: No swelling, tenderness, deformity or signs of injury. Normal range of motion.     Cervical back: Normal range of motion and neck supple. No rigidity or tenderness.     Right lower leg: 2+ Edema present.     Left lower leg: 2+ Edema present.  Lymphadenopathy:     Cervical: No cervical adenopathy.  Skin:    General: Skin is warm and dry.     Coloration: Skin is not jaundiced or pale.     Findings: No bruising, erythema, lesion or rash.  Neurological:     General: No focal deficit present.     Mental Status: He is alert and oriented to person, place, and time. Mental status is at baseline.     Cranial Nerves: No cranial nerve deficit.     Sensory: No sensory deficit.     Motor: No weakness.     Coordination: Coordination normal.     Gait: Gait normal.     Deep Tendon Reflexes: Reflexes normal.  Psychiatric:        Mood and Affect: Mood normal.        Behavior: Behavior normal.        Thought Content: Thought content normal.        Judgment: Judgment normal.     LABS:      Latest Ref Rng & Units 03/09/2023    1:44 PM 02/26/2023   12:00 AM 02/23/2023   12:00 AM  CBC  WBC 4.0 - 10.5 K/uL 3.5  4.2     4.0      Hemoglobin 13.0 - 17.0 g/dL 9.8  9.5     9.9      Hematocrit 39.0 - 52.0 % 32.5  30     31       Platelets 150 - 400 K/uL 120  130     119         This result is from an  external source.       Latest Ref Rng & Units 03/09/2023    1:44 PM 02/26/2023    2:24 PM 02/23/2023    2:16 PM  CMP  Glucose 70 - 99 mg/dL 161  096  045   BUN 8 - 23 mg/dL 47  32  33   Creatinine 0.61 - 1.24 mg/dL 4.09  8.11  9.14   Sodium 135 - 145 mmol/L 140  146  141   Potassium 3.5 - 5.1 mmol/L 4.3  4.8  4.5   Chloride 98 - 111 mmol/L 105  108  108   CO2 22 - 32 mmol/L 26  20  25    Calcium 8.9 - 10.3 mg/dL 9.0  9.4  9.2   Total Protein 6.5 - 8.1 g/dL 7.1  6.6  7.1   Total Bilirubin 0.3 - 1.2 mg/dL 0.8  0.4  0.7   Alkaline Phos 38 - 126 U/L 89  122  101   AST 15 - 41 U/L 18  20  19    ALT 0 - 44 U/L 23  20  21     Component Ref Range & Units 1 mo ago (12/01/22) 7 mo ago (06/16/22) 1 yr ago (10/09/21)  Magnesium 1.7 - 2.4 mg/dL 1.7 7.82 R 2.6 High  CM   Component Ref Range & Units 03/09/2023 1 mo ago 2 mo ago 12/01/22 6 mo ago 7 mo ago 10 mo ago  T4,  Total 4.5 - 12.0 ug/dL 16.1 9.9 CM 8.5 CM 9.0 CM 8.8 CM 9.3 CM 9.1 CM   Component Ref Range & Units 03/09/2023 1 mo ago 2 mo ago 12/01/22 6 mo ago 7 mo ago 9 mo ago  TSH 0.350 - 4.500 uIU/mL 4.232 3.618 CM 5.918 High  CM 4.720 High  CM 4.432 CM 4.084 CM 3.881 CM    No results found for: "CEA1", "CEA" / No results found for: "CEA1", "CEA" No results found for: "PSA1" No results found for: "WRU045" No results found for: "CAN125"  No results found for: "TOTALPROTELP", "ALBUMINELP", "A1GS", "A2GS", "BETS", "BETA2SER", "GAMS", "MSPIKE", "SPEI" Lab Results  Component Value Date   TIBC 295 02/26/2023   TIBC 337 02/23/2023   TIBC 321 09/24/2022   FERRITIN 118 02/26/2023   FERRITIN 64 02/23/2023   FERRITIN 108 09/24/2022   IRONPCTSAT 15 02/26/2023   IRONPCTSAT 11 (L) 02/23/2023   IRONPCTSAT 16 (L) 09/24/2022   Lab Results  Component Value Date   LDH 120 03/09/2023   LDH 132 02/23/2023   LDH 108 09/24/2022    STUDIES:        HISTORY:   Past Medical History:  Diagnosis Date   Anemia 05/20/2022   Cancer (HCC)     CHF (congestive heart failure) (HCC)    Chronic systolic heart failure (HCC) 04/21/2018   COPD (chronic obstructive pulmonary disease) (HCC)    Diabetes mellitus without complication (HCC)    Encounter for assessment of implantable cardioverter-defibrillator (ICD) 02/01/2023   History of myocardial infarction    Hyperlipidemia    Hypertension    ICD: [employer] Jude/Abbott dual-chamber defibrillator 05/18/2014 02/01/2023   Iron deficiency anemia 05/21/2022   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)    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)    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)    Thrombocytopenia (HCC) 07/29/2022    Past Surgical History:  Procedure Laterality Date   APPENDECTOMY     CARDIAC CATHETERIZATION     CORONARY ANGIOPLASTY     PACEMAKER INSERTION     RIGHT/LEFT HEART CATH AND CORONARY ANGIOGRAPHY N/A 10/09/2021   Procedure: RIGHT/LEFT HEART CATH AND CORONARY ANGIOGRAPHY;  Surgeon: Elder Negus, MD;  Location: [facility] INVASIVE CV LAB;  Service: Cardiovascular;  Laterality: N/A;    Family History  Problem Relation Age of Onset   Breast cancer Mother    Diabetes Mellitus II Father    Ovarian cancer Sister    Cancer Brother    Cancer Brother    Cancer Maternal Uncle    Breast cancer Niece     Social History:  reports that he has been smoking cigarettes. He has a 30.00 pack-year smoking history. He has never used smokeless tobacco. He reports that he does not currently use alcohol. He reports that he does not currently use drugs.The patient is alone today.  Allergies: No Known Allergies  Current Medications: Current Outpatient Medications  Medication Sig Dispense Refill   albuterol (PROVENTIL) (2.5 MG/3ML) 0.083% nebulizer solution Take by nebulization.     albuterol (VENTOLIN HFA) 108 (90 Base) MCG/ACT inhaler Inhale into the lungs.     ALBUTEROL SULFATE PO Take by mouth.     amiodarone (PACERONE) 200 MG tablet Take 0.5  tablets (100 mg total) by mouth daily. 30 tablet 5   apixaban (ELIQUIS) 5 MG TABS tablet Take 1 tablet (5 mg total) by mouth 2 (two) times daily. 180 tablet 3   atorvastatin (LIPITOR)  80 MG tablet Take 1 tablet (80 mg total) by mouth at bedtime. 90 tablet 3   Fe Fum-FA-B Cmp-C-Zn-Mg-Mn-Cu (HEMOCYTE PLUS) 106-1 MG CAPS Take 1 capsule by mouth daily.     fidaxomicin (DIFICID) 200 MG TABS tablet Take 1 tablet (200 mg total) by mouth 2 (two) times daily. 20 tablet 0   furosemide (LASIX) 40 MG tablet Take 1 tablet (40 mg total) by mouth every other day. 90 tablet 1   magnesium oxide (MAG-OX) 400 (240 Mg) MG tablet TAKE 1 TABLET BY MOUTH TWICE A DAY 180 tablet 1   metFORMIN (GLUCOPHAGE) 500 MG tablet Take 500 mg by mouth 2 (two) times daily.     metoprolol succinate (TOPROL-XL) 25 MG 24 hr tablet Take 0.5 tablets (12.5 mg total) by mouth daily. 45 tablet 3   ondansetron (ZOFRAN-ODT) 4 MG disintegrating tablet Take 1 tablet by mouth daily as needed.     spironolactone (ALDACTONE) 25 MG tablet Take 1 tablet (25 mg total) by mouth every morning. 30 tablet 2   SYMBICORT 80-4.5 MCG/ACT inhaler Inhale into the lungs.     tamsulosin (FLOMAX) 0.4 MG CAPS capsule Take by mouth.     TRUE METRIX BLOOD GLUCOSE TEST test strip      TRUEplus Lancets 33G MISC      No current facility-administered medications for this visit.     I,Jasmine M Lassiter,acting as a scribe for Dellia Beckwith, MD.,have documented all relevant documentation on the behalf of Dellia Beckwith, MD,as directed by  Dellia Beckwith, MD while in the presence of Dellia Beckwith, MD.

## 2023-04-08 ENCOUNTER — Ambulatory Visit: Payer: Medicare PPO | Admitting: Oncology

## 2023-04-08 ENCOUNTER — Other Ambulatory Visit: Payer: Medicare PPO

## 2023-04-08 NOTE — Progress Notes (Signed)
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  He had a hypermetabolic right submandibular node on PET, but no evidence of distant metastasis.  His original lesion was diagnosed in March 2016.  He underwent excision, but refused aggressive therapy.  He has been receiving palliative nivolumab since November 2016, with several interruptions. His treatment was on hold from December 2022 to April 2023 due to cardiorespiratory issues. He continues to tolerate nivolumab without difficulty.  CT imaging in August 2023 remains without evidence of metastatic disease.  However, there were increasing bilateral pleural effusions and small volume abdominal pelvic ascites, with stable pericardial effusion, felt to be most likely due to fluid overload.  He is on Entresto. His current CT scans show frank pulmonary edema but no evidence of metastatic or recurrent melanoma. He is following up with his cardiologist now and has a echocardiogram scheduled on 04/14/2023.    B12 deficiency anemia He had missed several B12 injections, but these were resumed and he has continued them monthly.  His hemoglobin had improved with the B12 injections, but then he was found to have be iron deficient.  His B12 level last month was good.   Iron deficiency anemia He has had chronic anemia, but in July was found to have iron deficiency, so was treated with IV iron.  His hemoglobin improved somewhat since receiving the iron but today's results are pending.   Thrombocytopenia (HCC) He is new mild thrombocytopenia of uncertain etiology.  This may be related to his treatment.  This has nearly resolved now.    Plan I will refill the nausea medication. He  continues to smoke and claims that he is trying to stop. He has a echocardiogram scheduled on 04/14/2023 and his day 1 cycle 23 of Nivolumab is scheduled on 04/15/2023. As stable as he is clinically, I think we can wait 6 months to repeat scans. The last ones were done on 12/26/2023.  His labs today are pending. I will see him back in 4 weeks with CBC, CMP, T4, and TSH. The patient understands the plans discussed today and is in agreement with them.  He knows to contact our office if he develops concerns prior to his next appointment.  I provided 16 minutes of face-to-face time during this this encounter and > 50% was spent counseling as documented under my assessment and plan.    Dellia Beckwith, MD  [facility] AT  [facility] [address] [place] [address] Dept: [phone] Dept Fax: [phone]   No orders of the defined types were placed in this encounter.   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  His original lesion was diagnosed in March 2016.  He underwent excision, but refused aggressive surgery.  The lesion was at least 4 mm thick with a Clark's level 4, and a mitotic index 10/mm.  The margins were positive, but wide reexcision revealed no residual melanoma.  He had recurrence within 2 months and the lesion had been steadily growing.  He initially had refused seeing a medical oncologist, but finally  came to [place] for evaluation and treatment in October 2016.  He does have extensive comorbidities including COPD, diabetes, hypertension, coronary artery disease, history of myocardial infarction, and pacemaker with AICD.  PET revealed hypermetabolism in the right submandibular node, but no evidence of distant metastasis.  He was having significant pain and  requiring regular hydrocodone doses.  He also had severe disfigurement and avoided going out in public because of the facial lesion.  He has been receiving palliative nivolumab since November 2016 and has had a good response with a decrease in the right facial tumor.  He has had associated hypomagnesemia and hypokalemia, so is on oral magnesium and potassium supplements. He has had mild chronic anemia, which has been stable. He was found to have B12 deficiency and continues B12 injections monthly.  CT head/neck in May 2017 revealed some nonspecific skin thickening in the right face in the area of his melanoma in the right submandibular lymph node had decreased in size, now measuring 6 x 12 mm, but still prominent when compared to the left submandibular node.  He had 15 cycles of nivolumab and relocated to continue to get the same treatment.  He had his 30th dose in February 2018 and then returned to the [place] area.     He was admitted to the hospital in early March 2018 for congestive heart failure associated with pleural and pericardial effusions.  He had no leukocytosis, fever or purulent sputum.  The echocardiogram revealed severe diffuse hypokinesis with mild concentric hypertrophy and markedly depressed ejection fraction of 20 to 25%, associated with moderate mitral regurgitation and moderate left atrial dilatation.  He improved with diuresis.  CT chest in March 2018 revealed stable cardiomegaly and stable small pericardial effusion, but no evidence of immune mediated pneumonitis or metastatic disease.  The previously seen bilateral pleural effusions had resolved.  He returned to our care and nivolumab every 2 weeks was resumed in April.  CT chest was repeated in early May after 1 month back on therapy.  This was stable, without evidence of recurrent pleural effusions, immunotherapy toxicities, or evidence of metastatic disease.  He was switched to every 28-day nivolumab in August 2018.  He has had stable  disease so has continued on nivolumab.  CT imaging in October 2018 revealed a stable 5 mm level 1B cervical lymph node, as well as a stable 8 mm exophytic lesion of the right face.  CT chest did not reveal any evidence of metastatic disease. Borderline cardiomegaly and a small amount of pericardial fluid/thickening was seen, but not reaccumulation of the pleural effusions.  CT head did not reveal any evidence of intracranial metastasis.  There were chronic ischemic changes and evidence of old infarcts.   Unfortunately, his wife passed away in 30-Jul-2019and she was his primary caregiver His daughters have been caring for him since then.  CT scans have remained stable, so he has continued nivolumab every 4 weeks.  He was given mirtazapine for depression and weight loss the dose was increased to 15 mg.  He was then lost to follow-up from December 2021 to April 2022, at which time he resumed nivolumab and B12 every 4 weeks.   CT head, neck and chest in June 2022 did not reveal any evidence of progressive disease.  He was admitted at [facility] in November/December 2022 due to flu and pneumonia.  Nivolumab was placed on hold in December.  CT chest, abdomen and pelvis in February did not reveal any evidence  of metastatic disease.  There was new extensive tree in bud nodularity in the right lung with most significant involvement of the right lower lobe. Findings were most consistent with an infectious or inflammatory process in a pattern suggestive of atypical infection including nontuberculous mycobacterium.  He was seen by Dr. Blenda Nicely, who recommended bronchoscopy, but the patient did not return for follow-up as he did not wish to pursue this procedure.  Nivolumab was resumed again in April.  He was last CT scans in August did not reveal obvious metastatic disease.  There was an increase in his bilateral pleural effusions, stable pericardial effusion and mild ascites, felt to most likely be due to fluid overload.  He  is on Entresto and follows with cardiology.   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 (HCC)  01/24/2015 Cancer Staging   Staging form: Melanoma of the Skin, AJCC 8th Edition - Clinical stage from 01/24/2015: Stage IIB (cT3b, cN0, cM0) - Signed by Dellia Beckwith, MD on 08/19/2021 Histopathologic type: Malignant melanoma, NOS (except juvenile melanoma [id number]) Stage prefix: Initial diagnosis Laterality: Right Lymph-vascular invasion (LVI): LVI not present (absent)/not identified Diagnostic confirmation: Positive histology Specimen type: Excision Staged by: Managing physician Mitotic count: 10 Mitotic unit: mm2 Clark's level: Level IV Tumor-infiltrating lymphocytes: Unknown Neurotropism: Absent Presence of extranodal extension: Absent Breslow depth (mm): 40 Ulceration of the epidermis: Yes Microsatellites: No Primary tumor regression: Unknown Sentinel lymph node biopsy performed: No Matted nodes: No Prognostic indicators: Wide excision negative Stage used in treatment planning: Yes National guidelines used in treatment planning: Yes Type of national guideline used in treatment planning: NCCN   07/18/2020 - 06/18/2022 Chemotherapy   Patient is on Treatment Plan : MELANOMA Nivolumab + B12 q28d     07/18/2020 -  Chemotherapy   Patient is on Treatment Plan : MELANOMA Nivolumab (480) q28d     07/29/2020 Initial Diagnosis   Malignant melanoma of other parts of face (HCC)       INTERVAL HISTORY:  Ethan is here today for repeat clinical assessment for reecurrent melanoma. Patient states that he is well and only complains of nausea when he smells certain things. I will refill the nausea medication. He continues to smoke and claims that he is trying to stop. He has a echocardiogram scheduled on 04/14/2023 and his day 1 cycle 23 of Nivolumab is scheduled on 04/15/2023. His labs today are pending. I will see him back in 4 weeks with CBC, CMP, T4, and TSH. He denies  signs of infection such as sore throat, sinus drainage, cough, or urinary symptoms.  He denies fevers or recurrent chills. He denies pain. He denies nausea, vomiting, chest pain, dyspnea or cough. His appetite is good and his  weight has increased 2 pounds over last month .  REVIEW OF SYSTEMS:  Review of Systems  Constitutional: Negative.  Negative for appetite change, chills, diaphoresis, fatigue, fever and unexpected weight change.  HENT:  Negative.  Negative for hearing loss, lump/mass, mouth sores, nosebleeds, sore throat, tinnitus, trouble swallowing and voice change.   Eyes: Negative.  Negative for eye problems and icterus.  Respiratory:  Positive for shortness of breath (with exertion and orthopnea.). Negative for chest tightness, cough, hemoptysis and wheezing.   Cardiovascular:  Positive for leg swelling. Negative for chest pain and palpitations.  Gastrointestinal:  Positive for nausea (triggered by smell). Negative for abdominal distention, abdominal pain, blood in stool, constipation, diarrhea, rectal pain and vomiting.  Endocrine: Negative.   Genitourinary: Negative.  Negative for bladder incontinence, difficulty urinating, dyspareunia, dysuria, frequency, hematuria, nocturia, pelvic pain and penile discharge.   Musculoskeletal: Negative.  Negative for arthralgias, back pain, flank pain, gait problem, myalgias, neck pain and neck stiffness.  Skin: Negative.  Negative for itching, rash and wound.  Neurological: Negative.  Negative for dizziness, extremity weakness, gait problem, headaches, light-headedness, numbness, seizures and speech difficulty.  Hematological:  Negative for adenopathy. Bruises/bleeds easily.  Psychiatric/Behavioral:  Positive for confusion. Negative for decreased concentration, depression, sleep disturbance and suicidal ideas. The patient is not nervous/anxious.      VITALS:  Blood pressure 118/71, pulse 97, temperature 97.6 F (36.4 C), temperature source Oral,  resp. rate 18, height 5\' 7"  (1.702 m), weight 165 lb 4.8 oz (75 kg), SpO2 98 %.  Wt Readings from Last 3 Encounters:  04/21/23 170 lb 12.8 oz (77.5 kg)  04/15/23 166 lb 0.6 oz (75.3 kg)  04/10/23 165 lb 4.8 oz (75 kg)    Body mass index is 25.89 kg/m.  Performance status (ECOG): 1 - Symptomatic but completely ambulatory  PHYSICAL EXAM:  Physical Exam Vitals and nursing note reviewed.  Constitutional:      General: He is not in acute distress.    Appearance: Normal appearance. He is normal weight. He is not ill-appearing, toxic-appearing or diaphoretic.  HENT:     Head: Normocephalic and atraumatic.     Right Ear: Tympanic membrane, ear canal and external ear normal. There is no impacted cerumen.     Left Ear: Tympanic membrane, ear canal and external ear normal. There is no impacted cerumen.     Nose: Nose normal. No congestion or rhinorrhea.     Comments: He has a slight firm area of skin just to the right of his nose which is tender but shows no sign of recurrence.     Mouth/Throat:     Mouth: Mucous membranes are moist.     Pharynx: Oropharynx is clear. No oropharyngeal exudate or posterior oropharyngeal erythema.  Eyes:     General: No scleral icterus.       Right eye: No discharge.        Left eye: No discharge.     Extraocular Movements: Extraocular movements intact.     Conjunctiva/sclera: Conjunctivae normal.     Pupils: Pupils are equal, round, and reactive to light.  Neck:     Vascular: No carotid bruit.  Cardiovascular:     Rate and Rhythm: Normal rate and regular rhythm.     Heart sounds: Normal heart sounds. No murmur heard.    No friction rub. No gallop.  Pulmonary:     Effort: Pulmonary effort is normal. No respiratory distress.     Breath sounds: No stridor. Examination of the right-upper field reveals wheezing. Examination of the right-middle field reveals wheezing. Examination of the right-lower field reveals wheezing and rales. Examination of the left-lower  field reveals rales. Wheezing and rales present. No decreased breath sounds or rhonchi.     Comments: Expiratory wheezes of the right lung Chest:     Chest wall: No tenderness.  Abdominal:     General: Bowel sounds are normal. There is no distension.     Palpations: Abdomen is soft. There is no fluid wave, hepatomegaly, splenomegaly or mass.     Tenderness: There is no abdominal tenderness. There is no right CVA tenderness, left CVA tenderness, guarding or rebound.     Hernia: No hernia is present.  Musculoskeletal:  General: No swelling, tenderness, deformity or signs of injury. Normal range of motion.     Cervical back: Normal range of motion and neck supple. No rigidity or tenderness.     Right lower leg: 1+ Edema present.     Left lower leg: 1+ Edema present.  Lymphadenopathy:     Cervical: No cervical adenopathy.  Skin:    General: Skin is warm and dry.     Coloration: Skin is not jaundiced or pale.     Findings: No bruising, erythema, lesion or rash.  Neurological:     General: No focal deficit present.     Mental Status: He is alert and oriented to person, place, and time. Mental status is at baseline.     Cranial Nerves: No cranial nerve deficit.     Sensory: No sensory deficit.     Motor: No weakness.     Coordination: Coordination normal.     Gait: Gait normal.     Deep Tendon Reflexes: Reflexes normal.  Psychiatric:        Mood and Affect: Mood normal.        Behavior: Behavior normal.        Thought Content: Thought content normal.        Judgment: Judgment normal.     LABS:      Latest Ref Rng & Units 04/10/2023    2:36 PM 03/09/2023    1:44 PM 02/26/2023   12:00 AM  CBC  WBC 4.0 - 10.5 K/uL 4.2  3.5  4.2      Hemoglobin 13.0 - 17.0 g/dL 16.1  9.8  9.5      Hematocrit 39.0 - 52.0 % 33.9  32.5  30      Platelets 150 - 400 K/uL 134  120  130         This result is from an external source.      Latest Ref Rng & Units 04/10/2023    2:36 PM 03/09/2023     1:44 PM 02/26/2023    2:24 PM  CMP  Glucose 70 - 99 mg/dL 096  045  409   BUN 8 - 23 mg/dL 30  47  32   Creatinine 0.61 - 1.24 mg/dL 8.11  9.14  7.82   Sodium 135 - 145 mmol/L 142  140  146   Potassium 3.5 - 5.1 mmol/L 4.2  4.3  4.8   Chloride 98 - 111 mmol/L 105  105  108   CO2 22 - 32 mmol/L 27  26  20    Calcium 8.9 - 10.3 mg/dL 9.3  9.0  9.4   Total Protein 6.5 - 8.1 g/dL 7.3  7.1  6.6   Total Bilirubin 0.3 - 1.2 mg/dL 0.6  0.8  0.4   Alkaline Phos 38 - 126 U/L 101  89  122   AST 15 - 41 U/L 14  18  20    ALT 0 - 44 U/L 14  23  20     Component Ref Range & Units 1 mo ago (03/09/23) 1 mo ago (02/23/23) 2 mo ago (01/13/23) 4 mo ago (12/01/22) 6 mo ago (09/23/22) 7 mo ago (08/27/22) 9 mo ago (06/16/22)  TSH 0.350 - 4.500 uIU/mL 4.232 3.618 CM 5.918 High  CM 4.720 High  CM 4.432 CM 4.084 CM 3.881 CM            Component Ref Range & Units 1 mo ago (03/09/23) 1 mo ago (02/23/23) 2 mo ago (01/13/23)  4 mo ago (12/01/22) 6 mo ago (09/23/22) 7 mo ago (08/27/22) 10 mo ago (05/20/22)  T4, Total 4.5 - 12.0 ug/dL 16.1 9.9 CM 8.5 CM 9.0 CM 8.8 CM 9.3 CM 9.1 CM   Component Ref Range & Units 02/26/23 10 mo ago  BNP 0.0 - 100.0 pg/mL 1,423.8 High  983.0 High  CM     Component Ref Range & Units 1 mo ago (12/01/22) 7 mo ago (06/16/22) 1 yr ago (10/09/21)  Magnesium 1.7 - 2.4 mg/dL 1.7 0.96 R 2.6 High  CM    No results found for: "CEA1", "CEA" / No results found for: "CEA1", "CEA" No results found for: "PSA1" No results found for: "EAV409" No results found for: "CAN125"  No results found for: "TOTALPROTELP", "ALBUMINELP", "A1GS", "A2GS", "BETS", "BETA2SER", "GAMS", "MSPIKE", "SPEI" Lab Results  Component Value Date   TIBC 295 02/26/2023   TIBC 337 02/23/2023   TIBC 321 09/24/2022   FERRITIN 118 02/26/2023   FERRITIN 64 02/23/2023   FERRITIN 108 09/24/2022   IRONPCTSAT 15 02/26/2023   IRONPCTSAT 11 (L) 02/23/2023   IRONPCTSAT 16 (L) 09/24/2022   Lab Results  Component Value Date    LDH 120 03/09/2023   LDH 132 02/23/2023   LDH 108 09/24/2022    STUDIES:        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)    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)    Thrombocytopenia (HCC) 07/29/2022    Past Surgical History:  Procedure Laterality Date   APPENDECTOMY     CARDIAC CATHETERIZATION     CORONARY ANGIOPLASTY     PACEMAKER INSERTION     RIGHT/LEFT HEART CATH AND CORONARY ANGIOGRAPHY N/A 10/09/2021   Procedure: RIGHT/LEFT HEART CATH AND CORONARY ANGIOGRAPHY;  Surgeon: Elder Negus, MD;  Location: [facility] INVASIVE CV LAB;  Service: Cardiovascular;  Laterality: N/A;    Family History  Problem Relation Age of Onset   Breast cancer Mother    Diabetes Mellitus II Father    Ovarian cancer Sister    Cancer Brother    Cancer Brother    Cancer Maternal Uncle    Breast cancer Niece     Social History:  reports that he has been smoking cigarettes. He has a 30.00 pack-year smoking history. He has never used smokeless tobacco. He reports that he does not currently use alcohol. He reports that he does not currently use drugs.The patient is alone today.  Allergies: No Known Allergies  Current Medications: Current Outpatient Medications  Medication Sig Dispense Refill   albuterol (PROVENTIL) (2.5 MG/3ML) 0.083% nebulizer solution Take by  nebulization.     albuterol (VENTOLIN HFA) 108 (90 Base) MCG/ACT inhaler Inhale into the lungs.     ALBUTEROL SULFATE PO Take by mouth.     amiodarone (PACERONE) 200 MG tablet Take 0.5 tablets (100 mg total) by mouth daily. 30  tablet 5   apixaban (ELIQUIS) 5 MG TABS tablet Take 1 tablet (5 mg total) by mouth 2 (two) times daily. 180 tablet 3   atorvastatin (LIPITOR) 80 MG tablet Take 1 tablet (80 mg total) by mouth at bedtime. 90 tablet 3   Fe Fum-FA-B Cmp-C-Zn-Mg-Mn-Cu (HEMOCYTE PLUS) 106-1 MG CAPS Take 1 capsule by mouth daily.     furosemide (LASIX) 40 MG tablet TAKE 1 TABLET EVERY OTHER DAY 45 tablet 3   magnesium oxide (MAG-OX) 400 (240 Mg) MG tablet TAKE 1 TABLET BY MOUTH TWICE A DAY 180 tablet 1   metFORMIN (GLUCOPHAGE) 500 MG tablet Take 500 mg by mouth 2 (two) times daily.     metoprolol succinate (TOPROL-XL) 25 MG 24 hr tablet TAKE 0.5 TABLET (12.5 MG TOTAL) BY MOUTH DAILY. 45 tablet 3   nicotine (NICODERM CQ) 14 mg/24hr patch Place 1 patch (14 mg total) onto the skin daily. 28 patch 0   spironolactone (ALDACTONE) 25 MG tablet TAKE 1 TABLET BY MOUTH EVERY DAY IN THE MORNING 90 tablet 1   SYMBICORT 80-4.5 MCG/ACT inhaler Inhale into the lungs.     TRUE METRIX BLOOD GLUCOSE TEST test strip      TRUEplus Lancets 33G MISC      No current facility-administered medications for this visit.     I,Jasmine M Lassiter,acting as a scribe for Dellia Beckwith, MD.,have documented all relevant documentation on the behalf of Dellia Beckwith, MD,as directed by  Dellia Beckwith, MD while in the presence of Dellia Beckwith, MD.

## 2023-04-10 ENCOUNTER — Encounter: Payer: Self-pay | Admitting: Oncology

## 2023-04-10 ENCOUNTER — Other Ambulatory Visit: Payer: Self-pay | Admitting: Oncology

## 2023-04-10 ENCOUNTER — Other Ambulatory Visit: Payer: Self-pay | Admitting: Cardiology

## 2023-04-10 ENCOUNTER — Inpatient Hospital Stay: Payer: Medicare PPO | Attending: Oncology

## 2023-04-10 ENCOUNTER — Ambulatory Visit: Payer: Medicare PPO

## 2023-04-10 ENCOUNTER — Inpatient Hospital Stay (INDEPENDENT_AMBULATORY_CARE_PROVIDER_SITE_OTHER): Payer: Medicare PPO | Admitting: Oncology

## 2023-04-10 VITALS — BP 118/71 | HR 97 | Temp 97.6°F | Resp 18 | Ht 67.0 in | Wt 165.3 lb

## 2023-04-10 DIAGNOSIS — D509 Iron deficiency anemia, unspecified: Secondary | ICD-10-CM | POA: Diagnosis not present

## 2023-04-10 DIAGNOSIS — C4339 Malignant melanoma of other parts of face: Secondary | ICD-10-CM | POA: Diagnosis not present

## 2023-04-10 DIAGNOSIS — Z79899 Other long term (current) drug therapy: Secondary | ICD-10-CM | POA: Diagnosis not present

## 2023-04-10 DIAGNOSIS — Z85820 Personal history of malignant melanoma of skin: Secondary | ICD-10-CM | POA: Insufficient documentation

## 2023-04-10 DIAGNOSIS — I5022 Chronic systolic (congestive) heart failure: Secondary | ICD-10-CM

## 2023-04-10 LAB — CMP (CANCER CENTER ONLY)
ALT: 14 U/L (ref 0–44)
AST: 14 U/L — ABNORMAL LOW (ref 15–41)
Albumin: 4.1 g/dL (ref 3.5–5.0)
Alkaline Phosphatase: 101 U/L (ref 38–126)
Anion gap: 10 (ref 5–15)
BUN: 30 mg/dL — ABNORMAL HIGH (ref 8–23)
CO2: 27 mmol/L (ref 22–32)
Calcium: 9.3 mg/dL (ref 8.9–10.3)
Chloride: 105 mmol/L (ref 98–111)
Creatinine: 1.46 mg/dL — ABNORMAL HIGH (ref 0.61–1.24)
GFR, Estimated: 48 mL/min — ABNORMAL LOW (ref >60)
Glucose, Bld: 180 mg/dL — ABNORMAL HIGH (ref 70–99)
Potassium: 4.2 mmol/L (ref 3.5–5.1)
Sodium: 142 mmol/L (ref 135–145)
Total Bilirubin: 0.6 mg/dL (ref 0.3–1.2)
Total Protein: 7.3 g/dL (ref 6.5–8.1)

## 2023-04-10 LAB — CBC WITH DIFFERENTIAL (CANCER CENTER ONLY)
Abs Immature Granulocytes: 0.01 10*3/uL (ref 0.00–0.07)
Basophils Absolute: 0 10*3/uL (ref 0.0–0.1)
Basophils Relative: 1 %
Eosinophils Absolute: 0.1 10*3/uL (ref 0.0–0.5)
Eosinophils Relative: 1 %
HCT: 33.9 % — ABNORMAL LOW (ref 39.0–52.0)
Hemoglobin: 10.3 g/dL — ABNORMAL LOW (ref 13.0–17.0)
Immature Granulocytes: 0 %
Lymphocytes Relative: 11 %
Lymphs Abs: 0.5 10*3/uL — ABNORMAL LOW (ref 0.7–4.0)
MCH: 30.4 pg (ref 26.0–34.0)
MCHC: 30.4 g/dL (ref 30.0–36.0)
MCV: 100 fL (ref 80.0–100.0)
Monocytes Absolute: 0.4 10*3/uL (ref 0.1–1.0)
Monocytes Relative: 9 %
Neutro Abs: 3.3 10*3/uL (ref 1.7–7.7)
Neutrophils Relative %: 78 %
Platelet Count: 134 10*3/uL — ABNORMAL LOW (ref 150–400)
RBC: 3.39 MIL/uL — ABNORMAL LOW (ref 4.22–5.81)
RDW: 14.8 % (ref 11.5–15.5)
WBC Count: 4.2 10*3/uL (ref 4.0–10.5)
nRBC: 0 % (ref 0.0–0.2)

## 2023-04-10 LAB — TSH: TSH: 3.443 u[IU]/mL (ref 0.350–4.500)

## 2023-04-10 MED ORDER — ONDANSETRON 4 MG PO TBDP
4.0000 mg | ORAL_TABLET | Freq: Three times a day (TID) | ORAL | 5 refills | Status: DC | PRN
Start: 2023-04-10 — End: 2023-04-21

## 2023-04-11 ENCOUNTER — Encounter: Payer: Self-pay | Admitting: Cardiology

## 2023-04-12 LAB — T4: T4, Total: 11.5 ug/dL (ref 4.5–12.0)

## 2023-04-14 ENCOUNTER — Encounter: Payer: Self-pay | Admitting: Hematology and Oncology

## 2023-04-14 ENCOUNTER — Ambulatory Visit: Payer: Medicare PPO

## 2023-04-14 DIAGNOSIS — I5022 Chronic systolic (congestive) heart failure: Secondary | ICD-10-CM | POA: Diagnosis not present

## 2023-04-14 DIAGNOSIS — I3139 Other pericardial effusion (noninflammatory): Secondary | ICD-10-CM

## 2023-04-14 MED FILL — Ferumoxytol Inj 510 MG/17ML (30 MG/ML) (Elemental Fe): INTRAVENOUS | Qty: 17 | Status: AC

## 2023-04-14 NOTE — Addendum Note (Signed)
Addended by: Domenic Schwab on: 04/14/2023 01:29 PM   Modules accepted: Orders

## 2023-04-15 ENCOUNTER — Inpatient Hospital Stay: Payer: Medicare PPO | Attending: Oncology

## 2023-04-15 VITALS — BP 118/56 | HR 76 | Temp 97.9°F | Resp 18 | Ht 67.0 in | Wt 166.0 lb

## 2023-04-15 DIAGNOSIS — D519 Vitamin B12 deficiency anemia, unspecified: Secondary | ICD-10-CM | POA: Insufficient documentation

## 2023-04-15 DIAGNOSIS — Z5112 Encounter for antineoplastic immunotherapy: Secondary | ICD-10-CM | POA: Diagnosis not present

## 2023-04-15 DIAGNOSIS — D696 Thrombocytopenia, unspecified: Secondary | ICD-10-CM | POA: Insufficient documentation

## 2023-04-15 DIAGNOSIS — Z79620 Long term (current) use of immunosuppressive biologic: Secondary | ICD-10-CM | POA: Diagnosis not present

## 2023-04-15 DIAGNOSIS — D509 Iron deficiency anemia, unspecified: Secondary | ICD-10-CM | POA: Diagnosis not present

## 2023-04-15 DIAGNOSIS — I5021 Acute systolic (congestive) heart failure: Secondary | ICD-10-CM | POA: Diagnosis not present

## 2023-04-15 DIAGNOSIS — C4339 Malignant melanoma of other parts of face: Secondary | ICD-10-CM | POA: Diagnosis not present

## 2023-04-15 MED ORDER — CYANOCOBALAMIN 1000 MCG/ML IJ SOLN
1000.0000 ug | Freq: Once | INTRAMUSCULAR | Status: AC
  Administered 2023-04-15: 1000 ug via INTRAMUSCULAR
  Filled 2023-04-15: qty 1

## 2023-04-15 MED ORDER — SODIUM CHLORIDE 0.9% FLUSH
10.0000 mL | INTRAVENOUS | Status: DC | PRN
  Administered 2023-04-15: 10 mL

## 2023-04-15 MED ORDER — HEPARIN SOD (PORK) LOCK FLUSH 100 UNIT/ML IV SOLN
500.0000 [IU] | Freq: Once | INTRAVENOUS | Status: AC | PRN
  Administered 2023-04-15: 500 [IU]

## 2023-04-15 MED ORDER — SODIUM CHLORIDE 0.9 % IV SOLN
480.0000 mg | Freq: Once | INTRAVENOUS | Status: AC
  Administered 2023-04-15: 480 mg via INTRAVENOUS
  Filled 2023-04-15: qty 48

## 2023-04-15 MED ORDER — SODIUM CHLORIDE 0.9 % IV SOLN: Freq: Once | INTRAVENOUS | Status: AC

## 2023-04-15 MED ORDER — SODIUM CHLORIDE 0.9 % IV SOLN
510.0000 mg | Freq: Once | INTRAVENOUS | Status: AC
  Administered 2023-04-15: 510 mg via INTRAVENOUS
  Filled 2023-04-15: qty 510

## 2023-04-15 NOTE — Patient Instructions (Signed)
[place] [facility]  Discharge Instructions: Thank you for choosing [facility] to provide your oncology and hematology care.  If you have a lab appointment with the Cancer Center, please go directly to the Cancer Center and check in at the registration area.   Wear comfortable clothing and clothing appropriate for easy access to any Portacath or PICC line.   We strive to give you quality time with your provider. You may need to reschedule your appointment if you arrive late (15 or more minutes).  Arriving late affects you and other patients whose appointments are after yours.  Also, if you miss three or more appointments without notifying the office, you may be dismissed from the clinic at the provider's discretion.      For prescription refill requests, have your pharmacy contact our office and allow 72 hours for refills to be completed.    Today you received the following chemotherapy and/or immunotherapy agents Opdivo, B12, feraheme       To help prevent nausea and vomiting after your treatment, we encourage you to take your nausea medication as directed.  BELOW ARE SYMPTOMS THAT SHOULD BE REPORTED IMMEDIATELY: *FEVER GREATER THAN 100.4 F (38 C) OR HIGHER *CHILLS OR SWEATING *NAUSEA AND VOMITING THAT IS NOT CONTROLLED WITH YOUR NAUSEA MEDICATION *UNUSUAL SHORTNESS OF BREATH *UNUSUAL BRUISING OR BLEEDING *URINARY PROBLEMS (pain or burning when urinating, or frequent urination) *BOWEL PROBLEMS (unusual diarrhea, constipation, pain near the anus) TENDERNESS IN MOUTH AND THROAT WITH OR WITHOUT PRESENCE OF ULCERS (sore throat, sores in mouth, or a toothache) UNUSUAL RASH, SWELLING OR PAIN  UNUSUAL VAGINAL DISCHARGE OR ITCHING   Items with * indicate a potential emergency and should be followed up as soon as possible or go to the Emergency Department if any problems should occur.  Please show the CHEMOTHERAPY ALERT CARD or IMMUNOTHERAPY ALERT CARD at  check-in to the Emergency Department and triage nurse.  Should you have questions after your visit or need to cancel or reschedule your appointment, please contact [facility]  Dept: [phone]  and follow the prompts.  Office hours are 8:00 a.m. to 4:30 p.m. Monday - Friday. Please note that voicemails left after 4:00 p.m. may not be returned until the following business day.  We are closed weekends and major holidays. You have access to a nurse at all times for urgent questions. Please call the main number to the clinic Dept: [phone] and follow the prompts.  For any non-urgent questions, you may also contact your provider using MyChart. We now offer e-Visits for anyone 91 and older to request care online for non-urgent symptoms. For details visit [email or web address].   Also download the MyChart app! Go to the app store, search "MyChart", open the app, select [place], and log in with your MyChart username and password.

## 2023-04-17 ENCOUNTER — Encounter: Payer: Self-pay | Admitting: Oncology

## 2023-04-17 ENCOUNTER — Encounter: Payer: Self-pay | Admitting: Hematology and Oncology

## 2023-04-18 ENCOUNTER — Other Ambulatory Visit: Payer: Self-pay | Admitting: Cardiology

## 2023-04-18 DIAGNOSIS — I5022 Chronic systolic (congestive) heart failure: Secondary | ICD-10-CM

## 2023-04-19 DIAGNOSIS — E1129 Type 2 diabetes mellitus with other diabetic kidney complication: Secondary | ICD-10-CM | POA: Diagnosis not present

## 2023-04-19 DIAGNOSIS — I10 Essential (primary) hypertension: Secondary | ICD-10-CM | POA: Diagnosis not present

## 2023-04-19 DIAGNOSIS — R52 Pain, unspecified: Secondary | ICD-10-CM | POA: Diagnosis not present

## 2023-04-19 DIAGNOSIS — E785 Hyperlipidemia, unspecified: Secondary | ICD-10-CM | POA: Diagnosis not present

## 2023-04-21 ENCOUNTER — Encounter: Payer: Self-pay | Admitting: Cardiology

## 2023-04-21 ENCOUNTER — Ambulatory Visit: Payer: Medicare PPO | Admitting: Cardiology

## 2023-04-21 VITALS — BP 127/52 | HR 37 | Resp 16 | Ht 67.0 in | Wt 170.8 lb

## 2023-04-21 DIAGNOSIS — Z4502 Encounter for adjustment and management of automatic implantable cardiac defibrillator: Secondary | ICD-10-CM | POA: Diagnosis not present

## 2023-04-21 DIAGNOSIS — I5020 Unspecified systolic (congestive) heart failure: Secondary | ICD-10-CM

## 2023-04-21 DIAGNOSIS — Z720 Tobacco use: Secondary | ICD-10-CM | POA: Diagnosis not present

## 2023-04-21 DIAGNOSIS — Z95810 Presence of automatic (implantable) cardiac defibrillator: Secondary | ICD-10-CM | POA: Diagnosis not present

## 2023-04-21 MED ORDER — NICOTINE 14 MG/24HR TD PT24
14.0000 mg | MEDICATED_PATCH | Freq: Every day | TRANSDERMAL | 0 refills | Status: DC
Start: 2023-04-21 — End: 2023-05-25

## 2023-04-21 NOTE — Progress Notes (Signed)
Primary Physician/Referring:  Lonie Peak, PA-C  Patient ID: Kevin [name], male    DOB: October 12, 1941, 82 y.o.   MRN: [id number]  Chief Complaint  Patient presents with   Pacemaker Check   Chronic systolic heart failure    Follow-up    HPI:    Eann Loi Alworth  is a 82 y.o. Caucasian male  with hypertension, type 2 DM, COPD with ongoing tobacco use and severe nicotine dependence, CAD s/p  NSTEMI and PCI (2015  3 x 24 mm DES to mid LAD), ischemic cardiomyopathy with previously known severe LV systolic dysfunction, chronic bilateral leg edema, h/o ventricular tachycardia SP Abbott dual-chamber ICD implantation  in 2015. Paroxysmal atrial fibrillation and presently on long-term anticoagulation with Eliquis after he presented with 1 episode of A-fib with RVR, respiratory failure and COPD exacerbation in November 2022, stage IIIa chronic kidney disease, melanoma-currently on monthly chemotherapy.   Patient is presently tolerating spironolactone.  States that his leg edema is improved and he has noticed mild improvement in dyspnea.  He is accompanied by his daughter.  Otherwise no specific complaints. Denies PND or orthopnea or chest pain, dizziness or syncope.   Patient is accompanied by his daughter.    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)    Malignant melanoma of skin of cheek  (external) (HCC)    Thrombocytopenia (HCC) 07/29/2022   Past Surgical History:  Procedure Laterality Date   APPENDECTOMY     CARDIAC CATHETERIZATION     CORONARY ANGIOPLASTY     PACEMAKER INSERTION     RIGHT/LEFT HEART CATH AND CORONARY ANGIOGRAPHY N/A 10/09/2021   Procedure: RIGHT/LEFT HEART CATH AND CORONARY ANGIOGRAPHY;  Surgeon: Elder Negus, MD;  Location: [facility];  Service: Cardiovascular;  Laterality: N/A;   Family History  Problem Relation Age of Onset   Breast cancer Mother    Diabetes Mellitus II Father    Ovarian cancer Sister    Cancer Brother    Cancer Brother    Cancer Maternal Uncle    Breast cancer Niece     Social History   Tobacco Use   Smoking status: Some Days    Packs/day: 0.50    Years: 60.00    Additional pack years: 0.00    Total pack years: 30.00    Types: Cigarettes   Smokeless tobacco: Never  Substance Use Topics   Alcohol use: Not Currently   Marital Status: Married   ROS  Review of Systems  Cardiovascular:  Positive for dyspnea on exertion and leg swelling. Negative for chest pain.   Objective  Blood pressure (!) 127/52, pulse (!) 37, resp. rate 16, height 5\' 7"  (1.702 m), weight 170 lb 12.8 oz (77.5 kg), SpO2 94 %.     04/21/2023    1:18 PM 04/15/2023    3:07 PM 04/15/2023    1:40 PM  Vitals with BMI  Height 5\' 7"   5\' 7"   Weight 170 lbs 13 oz  166 lbs 1 oz  BMI 26.74  26  Systolic 127 118 960  Diastolic 52 56 63  Pulse 37 76 84      Physical Exam Vitals reviewed.  Constitutional:      Appearance: He is ill-appearing.     Comments: Frail  Neck:     Vascular: No carotid bruit or JVD.  Cardiovascular:     Rate and Rhythm: Normal rate and regular rhythm.     Pulses: Decreased pulses.          Dorsalis pedis pulses are 0 on the right side and 0 on the left side.       Posterior tibial pulses are 0 on the right side and 0 on the left side.     Heart sounds: S1 normal and S2 normal. Heart sounds are distant.  Murmur heard.     Midsystolic murmur is present with a grade of 2/6 radiating to the apex.     No gallop.  Pulmonary:     Effort: Pulmonary effort is normal.     Breath sounds: Rales (scattered) present.  Abdominal:     General: Bowel sounds are normal.     Palpations: Abdomen is soft.  Musculoskeletal:     Right lower leg: Edema (2+ pitting to knee) present.     Left lower leg: Edema (2+ pitting to knee) present.     Laboratory examination:   Recent Labs    02/23/23 1416 02/26/23 1424 03/09/23 1344 04/10/23 1436  NA 141 146* 140 142  K 4.5 4.8 4.3 4.2  CL 108 108* 105 105  CO2 25 20 26 27   GLUCOSE 157* 140* 152* 180*  BUN 33* 32* 47* 30*  CREATININE 1.60* 1.68* 1.94* 1.46*  CALCIUM 9.2 9.4 9.0 9.3  GFRNONAA 43*  --  34* 48*      Latest Ref Rng & Units 04/10/2023    2:36 PM 03/09/2023    1:44 PM 02/26/2023    2:24 PM  CMP  Glucose 70 - 99 mg/dL 454  098  119   BUN 8 - 23 mg/dL 30  47  32   Creatinine 0.61 - 1.24 mg/dL 1.47  8.29  5.62   Sodium 135 - 145 mmol/L 142  140  146   Potassium 3.5 - 5.1 mmol/L 4.2  4.3  4.8   Chloride 98 - 111 mmol/L 105  105  108   CO2 22 - 32 mmol/L 27  26  20    Calcium 8.9 - 10.3 mg/dL 9.3  9.0  9.4   Total Protein 6.5 - 8.1 g/dL 7.3  7.1  6.6   Total Bilirubin 0.3 - 1.2 mg/dL 0.6  0.8  0.4   Alkaline Phos 38 - 126 U/L 101  89  122   AST 15 - 41 U/L 14  18  20    ALT 0 - 44 U/L 14  23  20        Latest Ref Rng & Units 04/10/2023    2:36 PM 03/09/2023    1:44 PM 02/26/2023    2:24 PM  Hepatic Function  Total Protein 6.5 - 8.1 g/dL 7.3  7.1  6.6   Albumin 3.5 - 5.0 g/dL 4.1  4.0  4.3   AST 15 - 41 U/L 14  18  20    ALT 0 - 44 U/L 14  23  20    Alk Phosphatase 38 -  126 U/L 101  89  122   Total Bilirubin 0.3 - 1.2 mg/dL 0.6  0.8  0.4        Latest Ref Rng & Units 04/10/2023    2:36 PM 03/09/2023    1:44 PM 02/26/2023   12:00 AM  CBC  WBC 4.0 - 10.5 K/uL 4.2  3.5  4.2      Hemoglobin 13.0 - 17.0 g/dL 16.1  9.8  9.5      Hematocrit  39.0 - 52.0 % 33.9  32.5  30      Platelets 150 - 400 K/uL 134  120  130         This result is from an external source.   Lab Results  Component Value Date   CHOL 107 02/26/2023   HDL 60 02/26/2023   LDLCALC 35 02/26/2023   TRIG 49 02/26/2023     HEMOGLOBIN A1C Lab Results  Component Value Date   HGBA1C 6.7 (H) 10/07/2021   MPG 146 10/07/2021   TSH Recent Labs    02/23/23 1330 03/09/23 1344 04/10/23 1435  TSH 3.618 4.232 3.443   External labs:    Radiology:   CT scan of the chest abdomen and pelvis with contrast 12/24/2022: Comparison 06/12/2022. Calcified coronary artery disease and post PTCA.  Mild to moderate cardiomegaly with moderate-sized pericardial effusion unchanged from prior CT scan. Aortic atherosclerosis without aneurysm. Central pulmonary vasculature is unremarkable.  Portacatheter in place terminating in the lower superior vena cava. Lungs: Bilateral small effusion stable on the left and slightly diminished on the right since previous imaging.  Impression:  1.  No definite signs of metastasis. 2.  Findings of suspected heart failure with effusions and pulmonary edema.  Pulmonary edema is new compared to previous imaging..  Pleural effusion is slightly diminished. 3.  Patchy groundglass opacity may reflect component of edema. Coronary artery disease with moderate pericardial effusion. 4.  Anasarca with body wall edema and ascites.  Cardiac Studies:    Right/left heart catheterization and coronary angiography 10/09/2021: LM: Normal LAD: Patent (2015  3 x 24 mm DES) mid LAD stent. No significant disease Lcx: No significant disease RCA: Ostial 100% occlusion. Left-to-right collateral up to mid RCA     RA: 11 mmHg RV: 51/0 mmHg PA: 64/22 mmHg, mPAP 34 mmHg PCW: 23 mmHg   CO: 3.6 L/min CI: 2.0 L/min/m2   Impression: Mildly decompensated nonischemic cardiomyopathy LV size and function out of proportion to RCA occlusion and patent mid LAD  stent Recommend GDMT for heart failure  PCV ECHOCARDIOGRAM COMPLETE 04/14/2023  Narrative Echocardiogram 04/14/2023: Left ventricle cavity is severely dilated. Mild concentric hypertrophy of the left ventricle. Severe global hypokinesis. LVEF <20%. No obvious LV thrombus noted on this noncontrast study.  Doppler evidence of grade II (pseudonormal) diastolic dysfunction, elevated LAP. Device lead seen in RA/RV. Left atrial cavity is severely dilated. Right atrial cavity is severely dilated. Native trileaflet aortic valve. No evidence of aortic stenosis. Mild to moderate aortic regurgitation. Moderate to severe mitral regurgitation. Severe tricuspid regurgitation. Pulmonary hypertension. Estimated pulmonary artery systolic pressure 73 mmHg. Small to moderate circumferential pericardial effusion. Previous study on 01/02/2022 reported mild to moderate tricuspid regurgitation, estimated PASP 55 mmHg.     ICD: Saint Jude/Abbott dual-chamber defibrillator   Scheduled  In office ICD 04/21/23  Single (S)/Dual (D)/BV (M) D Presenting ASVS Pacer dependant: No. Underlying NSR. AP 5%, VP 5.1%.  AMS Episodes Brief.  AT/AF burden 1%. Longest 12S.  HVR 0.  Longevity 1.2 Years/Voltage.  Lead measurements: Stable Histogram: Low (L)/normal (N)/high (H)  Normal Patient activity Normal. Thoracic impedance: Normal, Last spike in Apr 2024 for 1 week  Observations: Normal dual chamber ICD function.  Changes: Morphology mapping updated. Changed vector from RV coil to can now RV cip to can.   EKG:   EKG 08/14/2022: Atrially paced rhythm at rate of 61 bpm, demand a pacing with first-degree AV block.  LBBB.  No further analysis.01/31/2022 sinus rhythm with left axis and left bundle branch block, no further analysis.  Allergies & Medications   No Known Allergies  Current Outpatient Medications:    albuterol (PROVENTIL) (2.5 MG/3ML) 0.083% nebulizer solution, Take by nebulization., Disp: , Rfl:    albuterol  (VENTOLIN HFA) 108 (90 Base) MCG/ACT inhaler, Inhale into the lungs., Disp: , Rfl:    ALBUTEROL SULFATE PO, Take by mouth., Disp: , Rfl:    amiodarone (PACERONE) 200 MG tablet, Take 0.5 tablets (100 mg total) by mouth daily., Disp: 30 tablet, Rfl: 5   apixaban (ELIQUIS) 5 MG TABS tablet, Take 1 tablet (5 mg total) by mouth 2 (two) times daily., Disp: 180 tablet, Rfl: 3   atorvastatin (LIPITOR) 80 MG tablet, Take 1 tablet (80 mg total) by mouth at bedtime., Disp: 90 tablet, Rfl: 3   Fe Fum-FA-B Cmp-C-Zn-Mg-Mn-Cu (HEMOCYTE PLUS) 106-1 MG CAPS, Take 1 capsule by mouth daily., Disp: , Rfl:    furosemide (LASIX) 40 MG tablet, TAKE 1 TABLET EVERY OTHER DAY, Disp: 45 tablet, Rfl: 3   magnesium oxide (MAG-OX) 400 (240 Mg) MG tablet, TAKE 1 TABLET BY MOUTH TWICE A DAY, Disp: 180 tablet, Rfl: 1   metFORMIN (GLUCOPHAGE) 500 MG tablet, Take 500 mg by mouth 2 (two) times daily., Disp: , Rfl:    metoprolol succinate (TOPROL-XL) 25 MG 24 hr tablet, TAKE 0.5 TABLET (12.5 MG TOTAL) BY MOUTH DAILY., Disp: 45 tablet, Rfl: 3   nicotine (NICODERM CQ) 14 mg/24hr patch, Place 1 patch (14 mg total) onto the skin daily., Disp: 28 patch, Rfl: 0   SYMBICORT 80-4.5 MCG/ACT inhaler, Inhale into the lungs., Disp: , Rfl:    TRUE METRIX BLOOD GLUCOSE TEST test strip, , Disp: , Rfl:    TRUEplus Lancets 33G MISC, , Disp: , Rfl:    spironolactone (ALDACTONE) 25 MG tablet, TAKE 1 TABLET BY MOUTH EVERY DAY IN THE MORNING, Disp: 90 tablet, Rfl: 1   Assessment     ICD-10-CM   1. Encounter for assessment of implantable cardioverter-defibrillator (ICD)  Z45.02     2. ICD: Saint Jude/Abbott dual-chamber defibrillator 05/18/2014  Z95.810     3. HFrEF (heart failure with reduced ejection fraction) (HCC)  I50.20     4. Tobacco use  Z72.0 nicotine (NICODERM CQ) 14 mg/24hr patch      Medications Discontinued During This Encounter  Medication Reason   fidaxomicin (DIFICID) 200 MG TABS tablet    ondansetron (ZOFRAN-ODT) 4 MG  disintegrating tablet    tamsulosin (FLOMAX) 0.4 MG CAPS capsule      Meds ordered this encounter  Medications   nicotine (NICODERM CQ) 14 mg/24hr patch    Sig: Place 1 patch (14 mg total) onto the skin daily.    Dispense:  28 patch    Refill:  0    Recommendations:   Kinneth Daaron Castanos is a 82 y.o. Caucasian male  with hypertension, type 2 DM, COPD with ongoing tobacco use and severe nicotine dependence, CAD s/p  NSTEMI and PCI (2015  3 x  24 mm DES to mid LAD), ischemic cardiomyopathy with previously known severe LV systolic dysfunction, chronic bilateral leg edema, h/o ventricular tachycardia SP Abbott dual-chamber ICD implantation  in 2015. Paroxysmal atrial fibrillation and presently on long-term anticoagulation with Eliquis after he presented with 1 episode of A-fib with RVR, respiratory failure and COPD exacerbation in November 2022, stage IIIa chronic kidney disease, melanoma-currently on monthly chemotherapy.   1. Encounter for assessment of implantable cardioverter-defibrillator (ICD) .  Requested a new transmitter be provided for the patient for remote transmissions.  2. ICD: Saint Jude/Abbott dual-chamber defibrillator 05/18/2014 ICD was interrogated today, normal function.  He is not pacemaker dependent.  No significant arrhythmias.  Final programming changes were done today to optimize morphology recognition in case of NSVT.  3. HFrEF (heart failure with reduced ejection fraction) (HCC) Patient has chronic systolic heart failure.  Since adding spironolactone, although he has stage IIIa/B chronic kidney disease, creatinine has remained stable, potassium is also stable, leg edema has improved since being on spironolactone, continue the same for now.  He has severe LV systolic dysfunction, echocardiogram on 04/14/2023 revealing EF <20%.  4. Tobacco use Patient now appears to be motivated for smoking cessation, he would like to try nicotine patches which I will send the prescription.   If he does well with 40 mg dose, I could certainly send for 7 mg dose as continuation for 2 to 3 months daughter is present and all questions answered.  I will see him back in 6 months for follow-up.  From heart failure standpoint he has done well and overall has remained stable.  - nicotine (NICODERM CQ) 14 mg/24hr patch; Place 1 patch (14 mg total) onto the skin daily.  Dispense: 28 patch; Refill: 0   Yates Decamp, MD, [facility] 04/21/2023, 1:54 PM Office: [phone] Fax: [phone] Pager: [phone]

## 2023-04-23 ENCOUNTER — Encounter: Payer: Self-pay | Admitting: Hematology and Oncology

## 2023-04-23 ENCOUNTER — Encounter: Payer: Self-pay | Admitting: Oncology

## 2023-05-01 ENCOUNTER — Encounter: Payer: Self-pay | Admitting: Oncology

## 2023-05-01 ENCOUNTER — Encounter: Payer: Self-pay | Admitting: Hematology and Oncology

## 2023-05-02 DIAGNOSIS — J449 Chronic obstructive pulmonary disease, unspecified: Secondary | ICD-10-CM | POA: Diagnosis not present

## 2023-05-02 DIAGNOSIS — I5040 Unspecified combined systolic (congestive) and diastolic (congestive) heart failure: Secondary | ICD-10-CM | POA: Diagnosis not present

## 2023-05-02 DIAGNOSIS — J9611 Chronic respiratory failure with hypoxia: Secondary | ICD-10-CM | POA: Diagnosis not present

## 2023-05-07 NOTE — Progress Notes (Incomplete)
[facility]  [address] 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  He had a hypermetabolic right submandibular node on PET, but no evidence of distant metastasis.  His original lesion was diagnosed in March 2016.  He underwent excision, but refused aggressive therapy.  He has been receiving palliative nivolumab since November 2016, with several interruptions. His treatment was on hold from December 2022 to April 2023 due to cardiorespiratory issues. He continues to tolerate nivolumab without difficulty.  CT imaging in August 2023 remains without evidence of metastatic disease.  However, there were increasing bilateral pleural effusions and small volume abdominal pelvic ascites, with stable pericardial effusion, felt to be most likely due to fluid overload.  He is on Entresto. His current CT scans show frank pulmonary edema but no evidence of metastatic or recurrent melanoma. He is following up with his cardiologist now and has a echocardiogram scheduled on 04/14/2023.    B12 deficiency anemia He had missed several B12 injections, but these were resumed and he has continued them monthly.  His hemoglobin had improved with the B12 injections, but then he was found to have be iron deficient.  His B12 level last month was good.   Iron deficiency anemia He has had chronic anemia, but in July was found to have iron deficiency, so was treated with IV iron.  His hemoglobin improved somewhat since receiving the iron but today's results are pending.   Thrombocytopenia (HCC) He is new mild thrombocytopenia of uncertain etiology.  This may be related to his treatment.  This has nearly resolved now.    Plan I will refill the nausea medication. He  continues to smoke and claims that he is trying to stop. He has a echocardiogram scheduled on 04/14/2023 and his day 1 cycle 23 of Nivolumab is scheduled on 04/15/2023. As stable as he is clinically, I think we can wait 6 months to repeat scans. The last ones were done on 12/26/2023.  His labs today are pending. I will see him back in 4 weeks with CBC, CMP, T4, and TSH. The patient understands the plans discussed today and is in agreement with them.  He knows to contact our office if he develops concerns prior to his next appointment.  I provided 16 minutes of face-to-face time during this this encounter and > 50% was spent counseling as documented under my assessment and plan.    [facility] AT [facility] [address] [place] [address] Dept: [phone] Dept Fax: [phone]   No orders of the defined types were placed in this encounter.   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  His original lesion was diagnosed in March 2016.  He underwent excision, but refused aggressive surgery.  The lesion was at least 4 mm thick with a Clark's level 4, and a mitotic index 10/mm.  The margins were positive, but wide reexcision revealed no residual melanoma.  He had recurrence within 2 months and the lesion had been steadily growing.  He initially had refused seeing a medical oncologist, but finally  came to [place] for evaluation and treatment in October 2016.  He does have extensive comorbidities including COPD, diabetes, hypertension, coronary artery disease, history of myocardial infarction, and pacemaker with AICD.  PET revealed hypermetabolism in the right submandibular node, but no evidence of distant metastasis.  He was having significant pain and requiring  regular hydrocodone doses.  He also had severe disfigurement and avoided going out in public because of the facial lesion.  He has been receiving palliative nivolumab since November 2016 and has had a good response with a decrease in the right facial tumor.  He has had associated hypomagnesemia and hypokalemia, so is on oral magnesium and potassium supplements. He has had mild chronic anemia, which has been stable. He was found to have B12 deficiency and continues B12 injections monthly.  CT head/neck in May 2017 revealed some nonspecific skin thickening in the right face in the area of his melanoma in the right submandibular lymph node had decreased in size, now measuring 6 x 12 mm, but still prominent when compared to the left submandibular node.  He had 15 cycles of nivolumab and relocated to continue to get the same treatment.  He had his 30th dose in February 2018 and then returned to the [place] area.     He was admitted to the hospital in early March 2018 for congestive heart failure associated with pleural and pericardial effusions.  He had no leukocytosis, fever or purulent sputum.  The echocardiogram revealed severe diffuse hypokinesis with mild concentric hypertrophy and markedly depressed ejection fraction of 20 to 25%, associated with moderate mitral regurgitation and moderate left atrial dilatation.  He improved with diuresis.  CT chest in March 2018 revealed stable cardiomegaly and stable small pericardial effusion, but no evidence of immune mediated pneumonitis or metastatic disease.  The previously seen bilateral pleural effusions had resolved.  He returned to our care and nivolumab every 2 weeks was resumed in April.  CT chest was repeated in early May after 1 month back on therapy.  This was stable, without evidence of recurrent pleural effusions, immunotherapy toxicities, or evidence of metastatic disease.  He was switched to every 28-day nivolumab in August 2018.  He has had stable disease  so has continued on nivolumab.  CT imaging in October 2018 revealed a stable 5 mm level 1B cervical lymph node, as well as a stable 8 mm exophytic lesion of the right face.  CT chest did not reveal any evidence of metastatic disease. Borderline cardiomegaly and a small amount of pericardial fluid/thickening was seen, but not reaccumulation of the pleural effusions.  CT head did not reveal any evidence of intracranial metastasis.  There were chronic ischemic changes and evidence of old infarcts.   Unfortunately, his wife passed away in 2019-07-24and she was his primary caregiver His daughters have been caring for him since then.  CT scans have remained stable, so he has continued nivolumab every 4 weeks.  He was given mirtazapine for depression and weight loss the dose was increased to 15 mg.  He was then lost to follow-up from December 2021 to April 2022, at which time he resumed nivolumab and B12 every 4 weeks.   CT head, neck and chest in June 2022 did not reveal any evidence of progressive disease.  He was admitted at [facility] in November/December 2022 due to flu and pneumonia.  Nivolumab was placed on hold in December.  CT chest, abdomen and pelvis in February did not reveal any evidence  of metastatic disease.  There was new extensive tree in bud nodularity in the right lung with most significant involvement of the right lower lobe. Findings were most consistent with an infectious or inflammatory process in a pattern suggestive of atypical infection including nontuberculous mycobacterium.  He was seen by Dr. Blenda Nicely, who recommended bronchoscopy, but the patient did not return for follow-up as he did not wish to pursue this procedure.  Nivolumab was resumed again in April.  He was last CT scans in August did not reveal obvious metastatic disease.  There was an increase in his bilateral pleural effusions, stable pericardial effusion and mild ascites, felt to most likely be due to fluid overload.  He is on  Entresto and follows with cardiology.   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 (HCC)  01/24/2015 Cancer Staging   Staging form: Melanoma of the Skin, AJCC 8th Edition - Clinical stage from 01/24/2015: Stage IIB (cT3b, cN0, cM0) - Signed by Dellia Beckwith, MD on 08/19/2021 Histopathologic type: Malignant melanoma, NOS (except juvenile melanoma [id number]) Stage prefix: Initial diagnosis Laterality: Right Lymph-vascular invasion (LVI): LVI not present (absent)/not identified Diagnostic confirmation: Positive histology Specimen type: Excision Staged by: Managing physician Mitotic count: 10 Mitotic unit: mm2 Clark's level: Level IV Tumor-infiltrating lymphocytes: Unknown Neurotropism: Absent Presence of extranodal extension: Absent Breslow depth (mm): 40 Ulceration of the epidermis: Yes Microsatellites: No Primary tumor regression: Unknown Sentinel lymph node biopsy performed: No Matted nodes: No Prognostic indicators: Wide excision negative Stage used in treatment planning: Yes National guidelines used in treatment planning: Yes Type of national guideline used in treatment planning: NCCN   07/18/2020 - 06/18/2022 Chemotherapy   Patient is on Treatment Plan : MELANOMA Nivolumab + B12 q28d     07/18/2020 -  Chemotherapy   Patient is on Treatment Plan : MELANOMA Nivolumab (480) q28d     07/29/2020 Initial Diagnosis   Malignant melanoma of other parts of face (HCC)       INTERVAL HISTORY:  Zyaire is here today for repeat clinical assessment for reecurrent melanoma. Patient states that he feels *** and ***.     He denies signs of infection such as sore throat, sinus drainage, cough, or urinary symptoms.  He denies fevers or recurrent chills. He denies pain. He denies nausea, vomiting, chest pain, dyspnea or cough. His appetite is *** and his weight {Weight change:10426}.  Patient states that he is well and only complains of nausea when he smells certain  things. I will refill the nausea medication. He continues to smoke and claims that he is trying to stop. He has a echocardiogram scheduled on 04/14/2023 and his day 1 cycle 23 of Nivolumab is scheduled on 04/15/2023. His labs today are pending. I will see him back in 4 weeks with CBC, CMP, T4, and TSH. He denies signs of infection such as sore throat, sinus drainage, cough, or urinary symptoms.  He denies fevers or recurrent chills. He denies pain. He denies nausea, vomiting, chest pain, dyspnea or cough. His appetite is good and his  weight has increased 2 pounds over last month .  REVIEW OF SYSTEMS:  Review of Systems  Constitutional: Negative.  Negative for appetite change, chills, diaphoresis, fatigue, fever and unexpected weight change.  HENT:  Negative.  Negative for hearing loss, lump/mass, mouth sores, nosebleeds, sore throat, tinnitus, trouble swallowing and voice change.   Eyes: Negative.  Negative for eye problems and icterus.  Respiratory:  Positive for shortness of breath (with  exertion and orthopnea.). Negative for chest tightness, cough, hemoptysis and wheezing.   Cardiovascular:  Positive for leg swelling. Negative for chest pain and palpitations.  Gastrointestinal:  Positive for nausea (triggered by smell). Negative for abdominal distention, abdominal pain, blood in stool, constipation, diarrhea, rectal pain and vomiting.  Endocrine: Negative.   Genitourinary: Negative.  Negative for bladder incontinence, difficulty urinating, dyspareunia, dysuria, frequency, hematuria, nocturia, pelvic pain and penile discharge.   Musculoskeletal: Negative.  Negative for arthralgias, back pain, flank pain, gait problem, myalgias, neck pain and neck stiffness.  Skin: Negative.  Negative for itching, rash and wound.  Neurological: Negative.  Negative for dizziness, extremity weakness, gait problem, headaches, light-headedness, numbness, seizures and speech difficulty.  Hematological:  Negative for  adenopathy. Bruises/bleeds easily.  Psychiatric/Behavioral:  Positive for confusion. Negative for decreased concentration, depression, sleep disturbance and suicidal ideas. The patient is not nervous/anxious.      VITALS:  There were no vitals taken for this visit.  Wt Readings from Last 3 Encounters:  04/21/23 170 lb 12.8 oz (77.5 kg)  04/15/23 166 lb 0.6 oz (75.3 kg)  04/10/23 165 lb 4.8 oz (75 kg)    There is no height or weight on file to calculate BMI.  Performance status (ECOG): 1 - Symptomatic but completely ambulatory  PHYSICAL EXAM:  Physical Exam Vitals and nursing note reviewed.  Constitutional:      General: He is not in acute distress.    Appearance: Normal appearance. He is normal weight. He is not ill-appearing, toxic-appearing or diaphoretic.  HENT:     Head: Normocephalic and atraumatic.     Right Ear: Tympanic membrane, ear canal and external ear normal. There is no impacted cerumen.     Left Ear: Tympanic membrane, ear canal and external ear normal. There is no impacted cerumen.     Nose: Nose normal. No congestion or rhinorrhea.     Comments: He has a slight firm area of skin just to the right of his nose which is tender but shows no sign of recurrence.     Mouth/Throat:     Mouth: Mucous membranes are moist.     Pharynx: Oropharynx is clear. No oropharyngeal exudate or posterior oropharyngeal erythema.  Eyes:     General: No scleral icterus.       Right eye: No discharge.        Left eye: No discharge.     Extraocular Movements: Extraocular movements intact.     Conjunctiva/sclera: Conjunctivae normal.     Pupils: Pupils are equal, round, and reactive to light.  Neck:     Vascular: No carotid bruit.  Cardiovascular:     Rate and Rhythm: Normal rate and regular rhythm.     Heart sounds: Normal heart sounds. No murmur heard.    No friction rub. No gallop.  Pulmonary:     Effort: Pulmonary effort is normal. No respiratory distress.     Breath sounds: No  stridor. Examination of the right-upper field reveals wheezing. Examination of the right-middle field reveals wheezing. Examination of the right-lower field reveals wheezing and rales. Examination of the left-lower field reveals rales. Wheezing and rales present. No decreased breath sounds or rhonchi.     Comments: Expiratory wheezes of the right lung Chest:     Chest wall: No tenderness.  Abdominal:     General: Bowel sounds are normal. There is no distension.     Palpations: Abdomen is soft. There is no fluid wave, hepatomegaly, splenomegaly or mass.  Tenderness: There is no abdominal tenderness. There is no right CVA tenderness, left CVA tenderness, guarding or rebound.     Hernia: No hernia is present.  Musculoskeletal:        General: No swelling, tenderness, deformity or signs of injury. Normal range of motion.     Cervical back: Normal range of motion and neck supple. No rigidity or tenderness.     Right lower leg: 1+ Edema present.     Left lower leg: 1+ Edema present.  Lymphadenopathy:     Cervical: No cervical adenopathy.  Skin:    General: Skin is warm and dry.     Coloration: Skin is not jaundiced or pale.     Findings: No bruising, erythema, lesion or rash.  Neurological:     General: No focal deficit present.     Mental Status: He is alert and oriented to person, place, and time. Mental status is at baseline.     Cranial Nerves: No cranial nerve deficit.     Sensory: No sensory deficit.     Motor: No weakness.     Coordination: Coordination normal.     Gait: Gait normal.     Deep Tendon Reflexes: Reflexes normal.  Psychiatric:        Mood and Affect: Mood normal.        Behavior: Behavior normal.        Thought Content: Thought content normal.        Judgment: Judgment normal.     LABS:      Latest Ref Rng & Units 04/10/2023    2:36 PM 03/09/2023    1:44 PM 02/26/2023   12:00 AM  CBC  WBC 4.0 - 10.5 K/uL 4.2  3.5  4.2      Hemoglobin 13.0 - 17.0 g/dL 16.1   9.8  9.5      Hematocrit 39.0 - 52.0 % 33.9  32.5  30      Platelets 150 - 400 K/uL 134  120  130         This result is from an external source.       Latest Ref Rng & Units 04/10/2023    2:36 PM 03/09/2023    1:44 PM 02/26/2023    2:24 PM  CMP  Glucose 70 - 99 mg/dL 096  045  409   BUN 8 - 23 mg/dL 30  47  32   Creatinine 0.61 - 1.24 mg/dL 8.11  9.14  7.82   Sodium 135 - 145 mmol/L 142  140  146   Potassium 3.5 - 5.1 mmol/L 4.2  4.3  4.8   Chloride 98 - 111 mmol/L 105  105  108   CO2 22 - 32 mmol/L 27  26  20    Calcium 8.9 - 10.3 mg/dL 9.3  9.0  9.4   Total Protein 6.5 - 8.1 g/dL 7.3  7.1  6.6   Total Bilirubin 0.3 - 1.2 mg/dL 0.6  0.8  0.4   Alkaline Phos 38 - 126 U/L 101  89  122   AST 15 - 41 U/L 14  18  20    ALT 0 - 44 U/L 14  23  20     Component Ref Range & Units 04/10/2023 1 mo ago (03/09/23) 1 mo ago (02/23/23) 2 mo ago (01/13/23) 4 mo ago (12/01/22) 6 mo ago (09/23/22) 7 mo ago (08/27/22) 9 mo ago (06/16/22)  TSH 0.350 - 4.500 uIU/mL 3.443 4.232 3.618 CM 5.918 High  CM 4.720 High  CM 4.432 CM 4.084 CM 3.881 CM    Component Ref Range & Units 04/10/2023 1 mo ago (03/09/23) 1 mo ago (02/23/23) 2 mo ago (01/13/23) 4 mo ago (12/01/22) 6 mo ago (09/23/22) 7 mo ago (08/27/22) 10 mo ago (05/20/22)  T4, Total 4.5 - 12.0 ug/dL 40.9 81.1 9.9 CM 8.5 CM 9.0 CM 8.8 CM 9.3 CM 9.1 CM   Component Ref Range & Units 02/26/23 10 mo ago  BNP 0.0 - 100.0 pg/mL 1,423.8 High  983.0 High  CM     Component Ref Range & Units 1 mo ago (12/01/22) 7 mo ago (06/16/22) 1 yr ago (10/09/21)  Magnesium 1.7 - 2.4 mg/dL 1.7 9.14 R 2.6 High  CM    No results found for: "CEA1", "CEA" / No results found for: "CEA1", "CEA" No results found for: "PSA1" No results found for: "NWG956" No results found for: "CAN125"  No results found for: "TOTALPROTELP", "ALBUMINELP", "A1GS", "A2GS", "BETS", "BETA2SER", "GAMS", "MSPIKE", "SPEI" Lab Results  Component Value Date   TIBC 295 02/26/2023   TIBC 337  02/23/2023   TIBC 321 09/24/2022   FERRITIN 118 02/26/2023   FERRITIN 64 02/23/2023   FERRITIN 108 09/24/2022   IRONPCTSAT 15 02/26/2023   IRONPCTSAT 11 (L) 02/23/2023   IRONPCTSAT 16 (L) 09/24/2022   Lab Results  Component Value Date   LDH 120 03/09/2023   LDH 132 02/23/2023   LDH 108 09/24/2022    STUDIES:  Echocardiogram 04/14/2023:  Left ventricle cavity is severely dilated. Mild concentric hypertrophy of  the left ventricle. Severe global hypokinesis. LVEF <20%. No obvious LV  thrombus noted on this noncontrast study.  Doppler evidence of grade II  (pseudonormal) diastolic dysfunction, elevated LAP.  Device lead seen in RA/RV.  Left atrial cavity is severely dilated.  Right atrial cavity is severely dilated.  Native trileaflet aortic valve. No evidence of aortic stenosis. Mild to  moderate aortic regurgitation.  Moderate to severe mitral regurgitation.  Severe tricuspid regurgitation. Pulmonary hypertension. Estimated  pulmonary artery systolic pressure 73 mmHg.  Small to moderate circumferential pericardial effusion.  Previous study on 01/02/2022 reported mild to moderate tricuspid  regurgitation, estimated PASP 55 mmHg.         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)    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)    Thrombocytopenia (HCC)  07/29/2022    Past Surgical History:  Procedure Laterality Date   APPENDECTOMY     CARDIAC CATHETERIZATION     CORONARY ANGIOPLASTY     PACEMAKER INSERTION     RIGHT/LEFT HEART CATH AND CORONARY ANGIOGRAPHY N/A 10/09/2021   Procedure: RIGHT/LEFT HEART CATH AND CORONARY ANGIOGRAPHY;  Surgeon: Elder Negus, MD;  Location: [facility] INVASIVE CV LAB;  Service: Cardiovascular;  Laterality: N/A;    Family History  Problem Relation Age of Onset   Breast cancer  Mother    Diabetes Mellitus II Father    Ovarian cancer Sister    Cancer Brother    Cancer Brother    Cancer Maternal Uncle    Breast cancer Niece     Social History:  reports that he has been smoking cigarettes. He has a 30.00 pack-year smoking history. He has never used smokeless tobacco. He reports that he does not currently use alcohol. He reports that he does not currently use drugs.The patient is alone today.  Allergies: No Known Allergies  Current Medications: Current Outpatient Medications  Medication Sig Dispense Refill   albuterol (PROVENTIL) (2.5 MG/3ML) 0.083% nebulizer solution Take by nebulization.     albuterol (VENTOLIN HFA) 108 (90 Base) MCG/ACT inhaler Inhale into the lungs.     ALBUTEROL SULFATE PO Take by mouth.     amiodarone (PACERONE) 200 MG tablet Take 0.5 tablets (100 mg total) by mouth daily. 30 tablet 5   apixaban (ELIQUIS) 5 MG TABS tablet Take 1 tablet (5 mg total) by mouth 2 (two) times daily. 180 tablet 3   atorvastatin (LIPITOR) 80 MG tablet Take 1 tablet (80 mg total) by mouth at bedtime. 90 tablet 3   Fe Fum-FA-B Cmp-C-Zn-Mg-Mn-Cu (HEMOCYTE PLUS) 106-1 MG CAPS Take 1 capsule by mouth daily.     furosemide (LASIX) 40 MG tablet TAKE 1 TABLET EVERY OTHER DAY 45 tablet 3   magnesium oxide (MAG-OX) 400 (240 Mg) MG tablet TAKE 1 TABLET BY MOUTH TWICE A DAY 180 tablet 1   metFORMIN (GLUCOPHAGE) 500 MG tablet Take 500 mg by mouth 2 (two) times daily.     metoprolol succinate (TOPROL-XL) 25 MG 24 hr  tablet TAKE 0.5 TABLET (12.5 MG TOTAL) BY MOUTH DAILY. 45 tablet 3   nicotine (NICODERM CQ) 14 mg/24hr patch Place 1 patch (14 mg total) onto the skin daily. 28 patch 0   spironolactone (ALDACTONE) 25 MG tablet TAKE 1 TABLET BY MOUTH EVERY DAY IN THE MORNING 90 tablet 1   SYMBICORT 80-4.5 MCG/ACT inhaler Inhale into the lungs.     TRUE METRIX BLOOD GLUCOSE TEST test strip      TRUEplus Lancets 33G MISC      No current facility-administered medications for this visit.     I,Jasmine M Lassiter,acting as a scribe for Dellia Beckwith, MD.,have documented all relevant documentation on the behalf of Dellia Beckwith, MD,as directed by  Dellia Beckwith, MD while in the presence of Dellia Beckwith, MD.

## 2023-05-08 ENCOUNTER — Inpatient Hospital Stay: Payer: Medicare PPO | Admitting: Oncology

## 2023-05-08 ENCOUNTER — Inpatient Hospital Stay: Payer: Medicare PPO

## 2023-05-12 ENCOUNTER — Encounter: Payer: Self-pay | Admitting: Oncology

## 2023-05-13 ENCOUNTER — Inpatient Hospital Stay: Payer: Medicare PPO

## 2023-05-13 DIAGNOSIS — I5022 Chronic systolic (congestive) heart failure: Secondary | ICD-10-CM | POA: Diagnosis not present

## 2023-05-13 DIAGNOSIS — Z4502 Encounter for adjustment and management of automatic implantable cardiac defibrillator: Secondary | ICD-10-CM | POA: Diagnosis not present

## 2023-05-13 DIAGNOSIS — Z95810 Presence of automatic (implantable) cardiac defibrillator: Secondary | ICD-10-CM | POA: Diagnosis not present

## 2023-05-14 ENCOUNTER — Encounter: Payer: Self-pay | Admitting: Cardiology

## 2023-05-21 ENCOUNTER — Telehealth: Payer: Self-pay | Admitting: Oncology

## 2023-05-21 NOTE — Telephone Encounter (Signed)
Contacted pts daughter to schedule an appt. Unable to reach via phone, voicemail was left.  Scheduling Message Entered by Domenic Schwab on 05/12/2023 at  1:52 PM Priority: High  <No visit type provided>  Department: [facility]  Provider:  Appointment Notes:  Since pt no showed for md and labs on 6/28, the infusion for 7/3 will need to be rescheduled.  Please attempt to get these appt rescheduled to next week.  Scheduling Notes:

## 2023-05-25 ENCOUNTER — Other Ambulatory Visit: Payer: Self-pay | Admitting: Cardiology

## 2023-05-25 DIAGNOSIS — Z720 Tobacco use: Secondary | ICD-10-CM

## 2023-05-29 NOTE — Progress Notes (Signed)
[facility]  [address] 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  He had a hypermetabolic right submandibular node on PET, but no evidence of distant metastasis.  His original lesion was diagnosed in March 2016.  He underwent excision, but refused aggressive therapy.  He has been receiving palliative nivolumab since November 2016, with several interruptions. His treatment was on hold from December 2022 to April 2023 due to cardiorespiratory issues. CT imaging in August 2023 remains without evidence of metastatic disease.  However, there were increasing bilateral pleural effusions and small volume abdominal pelvic ascites, with stable pericardial effusion, felt to be most likely due to fluid overload.  His heart failure is managed by cardiology.  CT scans in February show frank pulmonary edema, but no evidence of metastatic or recurrent melanoma.  He continues to follow with cardiology.  Dr. Gilman Buttner recommended follow-up CT imaging in 6 months.  He received a 22nd cycle of nivolumab in March.  His 23rd cycle was delayed due to C. difficile in April.  He received 23rd cycle on June 5.  He was due for treatment on July 3, but did not keep his follow-up and labs on June 28 prior to infusion.   He has recurrent severe diarrhea, concerning for recurrent C. difficile versus immune mediated diarrhea.  I recommended holding the 24th cycle nivolumab this week, but the patient wishes to proceed if possible. I will await the results of stool tests before making a final decision.    B12 deficiency anemia His B12 injections have been given sporadically as well.  Repeat B12 level in April was normal.  He last received B12 on June 5.  Iron deficiency  anemia He was found to have iron deficiency in July 2023.  He was given IV iron at that time.  His hemoglobin had dropped again in April.  Iron studies in April revealed recurrent iron deficiency, so he received a dose of Feraheme on June 5.    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  The patient was unable to give a stool specimen today.  He will send a specimen with a family member tomorrow.  He did not wish to cancel his infusion for Friday, but I do not feel like we will treat him either way.  Weight loss This is likely due to both fluid losses from diarrhea and diuretics, but he reports decreased appetite as well.    The patient understands the plans discussed today and is in agreement with them.  He knows to contact our office if he develops concerns prior to his next appointment.   I provided 30 minutes of face-to-face time during this encounter and > 50% was spent counseling as documented under my assessment and plan.    Adah Perl, PA-C  [facility] [address] 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  His original lesion was diagnosed in March 2016.  He underwent excision, but refused aggressive surgery.  The lesion was at least 4 mm thick with a Clark's level 4, and a mitotic index 10/mm.  The margins were positive, but wide  reexcision revealed no residual melanoma.  He had recurrence within 2 months and the lesion had been steadily growing.  He initially had refused seeing a medical oncologist, but finally came to [place] for evaluation and treatment in October 2016.  He does have extensive comorbidities including COPD, diabetes, hypertension, coronary artery disease, history of myocardial infarction, and pacemaker with AICD.  PET revealed hypermetabolism in the right submandibular node, but no evidence of distant metastasis.  He was having significant pain and requiring regular hydrocodone doses.  He also had severe disfigurement and avoided going out in public because of the facial lesion.  He has been receiving palliative nivolumab since November 2016 and has had a good response with a decrease in the right facial tumor.  He has had associated hypomagnesemia and hypokalemia, so is on oral magnesium and potassium supplements. He has had mild chronic anemia, which has been stable. He was found to have B12 deficiency and continues B12 injections monthly.  CT head/neck in May 2017 revealed some nonspecific skin thickening in the right face in the area of his melanoma in the right submandibular lymph node had decreased in size, now measuring 6 x 12 mm, but still prominent when compared to the left submandibular node.  He had 15 cycles of nivolumab and relocated to continue to get the same treatment.  He had his 30th dose in February 2018 and then returned to the [place] area.     He was admitted to the hospital in early March 2018 for congestive heart failure associated with pleural and pericardial effusions.  He had no leukocytosis, fever or purulent sputum.  The echocardiogram revealed severe diffuse hypokinesis with mild concentric hypertrophy and markedly depressed ejection fraction of 20 to 25%, associated with moderate mitral regurgitation and moderate left atrial dilatation.  He improved with diuresis.  CT chest in March 2018  revealed stable cardiomegaly and stable small pericardial effusion, but no evidence of immune mediated pneumonitis or metastatic disease.  The previously seen bilateral pleural effusions had resolved.  He returned to our care and nivolumab every 2 weeks was resumed in April.  CT chest was repeated in early May after 1 month back on therapy.  This was stable, without evidence of recurrent pleural effusions, immunotherapy toxicities, or evidence of metastatic disease.  He was switched to every 28-day nivolumab in August 2018.  He has had stable disease so has continued on nivolumab.  CT imaging in October 2018 revealed a stable 5 mm level 1B cervical lymph node, as well as a stable 8 mm exophytic lesion of the right face.  CT chest did not reveal any evidence of metastatic disease. Borderline cardiomegaly and a small amount of pericardial fluid/thickening was seen, but not reaccumulation of the pleural effusions.  CT head did not reveal any evidence of intracranial metastasis.  There were chronic ischemic changes and evidence of old infarcts.   Unfortunately, his wife passed away in 08/17/2019and she was his primary caregiver His  daughters have been caring for him since then.  CT scans have remained stable, so he has continued nivolumab every 4 weeks.  He was given mirtazapine for depression and weight loss the dose was increased to 15 mg.  He was then lost to follow-up from December 2021 to April 2022, at which time he resumed nivolumab and B12 every 4 weeks.   CT head, neck and chest in June 2022 did not reveal any evidence of progressive disease.  He was admitted at [facility] in November/December 2022 due to flu and pneumonia.  Nivolumab was placed on hold in December.  CT chest, abdomen and pelvis in February 2023 did not reveal any evidence of metastatic disease.  There was new extensive tree in bud nodularity in the right lung with most significant involvement of the right lower lobe. Findings were most  consistent with an infectious or inflammatory process in a pattern suggestive of atypical infection including nontuberculous mycobacterium.  He was seen by Dr. Blenda Nicely, who recommended bronchoscopy, but the patient did not return for follow-up as he did not wish to pursue this procedure. Nivolumab was resumed again in April.  CT scans in August 2023 did not reveal obvious metastatic disease.  There was an increase in his bilateral pleural effusions, stable pericardial effusion and mild ascites, felt to most likely be due to fluid overload.  He is on Entresto and follows with cardiology.    He received a 22nd cycle of nivolumab in March.  His 23rd cycle was delayed due to C. difficile in April.  CT face, chest, abdomen and pelvis in May not reveal any definitive signs of recurrent or metastatic disease.  There was unchanged mild chronic skin thickening in the right infraorbital/pre-maxillary region, at site of the patient's treated melanoma. No CT findings to suggest recurrent tumor at this site. No lymphadenopathy within the imaged neck. Small 6 mm nodule in the RIGHT lower lobe could represent focal inflammation or atelectasis. Findings of suspected heart failure with effusions and pulmonary edema. Pulmonary edema is new compared to previous imaging. RIGHT pleural effusion is slightly diminished. Patchy ground-glass may also reflect a component of edema. Cardiomegaly and signs of coronary artery disease/PTC with stable moderate pericardial effusion. Anasarca with body wall edema and ascites. Mild skin thickening over the anterior abdominal wall and in the gluteal cleft similar to prior imaging.  He saw his cardiologist and underwent echocardiogram on June 4, which revealed left ventricle cavity is severely dilated. Mild concentric hypertrophy of the left ventricle. Severe global hypokinesis. LVEF <20%. No obvious LV thrombus noted on this noncontrast study.  Doppler evidence of grade II (pseudonormal) diastolic  dysfunction, elevated LAP. Device lead seen in RA/RV. Left atrial cavity is severely dilated. Right atrial cavity is severely dilated. Native trileaflet aortic valve. No evidence of aortic stenosis. Mild to moderate aortic regurgitation. Moderate to severe mitral regurgitation. Severe tricuspid regurgitation. Pulmonary hypertension. Estimated pulmonary artery systolic pressure 73 mmHg. Small to moderate circumferential pericardial effusion. Previous study in February 2023 reported mild to moderate tricuspid regurgitation, estimated PASP 55 mmHg. Dr. Gilman Buttner recommended follow-up CT imaging in 6 months.    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 (HCC)  01/24/2015 Cancer Staging   Staging form: Melanoma of the Skin, AJCC 8th Edition - Clinical stage from 01/24/2015: Stage IIB (cT3b, cN0, cM0) - Signed by Dellia Beckwith, MD on 08/19/2021 Histopathologic type: Malignant melanoma, NOS (except juvenile melanoma [id number]) Stage prefix: Initial diagnosis Laterality: Right Lymph-vascular  invasion (LVI): LVI not present (absent)/not identified Diagnostic confirmation: Positive histology Specimen type: Excision Staged by: Managing physician Mitotic count: 10 Mitotic unit: mm2 Clark's level: Level IV Tumor-infiltrating lymphocytes: Unknown Neurotropism: Absent Presence of extranodal extension: Absent Breslow depth (mm): 40 Ulceration of the epidermis: Yes Microsatellites: No Primary tumor regression: Unknown Sentinel lymph node biopsy performed: No Matted nodes: No Prognostic indicators: Wide excision negative Stage used in treatment planning: Yes National guidelines used in treatment planning: Yes Type of national guideline used in treatment planning: NCCN   07/18/2020 - 06/18/2022 Chemotherapy   Patient is on Treatment Plan : MELANOMA Nivolumab + B12 q28d     07/18/2020 -  Chemotherapy   Patient is on Treatment Plan : MELANOMA Nivolumab (480) q28d     07/29/2020 Initial  Diagnosis   Malignant melanoma of other parts of face (HCC)       INTERVAL HISTORY:  Kevin [name] is here today for repeat clinical assessment.  He received a 23rd cycle of nivolumab on June 5.  He was due for treatment on July 3, but did not keep his follow-up and labs on June 28 prior to infusion, so treatment had to be delayed again.  He states he has been doing fairly well.  He states he had a fall on his porch recently and injured his right lower ribs.  He states he was seen at urgent care and did not have any fractures.  He reports increased diarrhea since his last visit.  He is waking up at night due to diarrhea and goes to the bathroom after eating or drinking.  Previously he reported 2-3 loose stools a day to his baseline.  He denies fevers or chills. He denies pain. His appetite is good. His weight has decreased 16 pounds over last 7 weeks, since his last visit here .  We gave him some more samples of nutritional supplements today.  REVIEW OF SYSTEMS:  Review of Systems  Constitutional:  Positive for appetite change and unexpected weight change. Negative for chills, fatigue and fever.  HENT:   Negative for lump/mass, mouth sores and sore throat.   Respiratory:  Negative for cough and shortness of breath.   Cardiovascular:  Negative for chest pain and leg swelling.  Gastrointestinal:  Positive for diarrhea. Negative for abdominal pain, constipation, nausea and vomiting.  Genitourinary:  Negative for difficulty urinating, dysuria, frequency and hematuria.   Musculoskeletal:  Negative for back pain.  Skin:  Negative for itching, rash and wound.  Neurological:  Negative for dizziness, extremity weakness, headaches, light-headedness and numbness.  Hematological:  Negative for adenopathy.  Psychiatric/Behavioral:  Positive for sleep disturbance. Negative for depression. The patient is not nervous/anxious.      VITALS:  Blood pressure (!) 117/49, pulse 78, temperature 98 F (36.7 C), resp. rate  (!) 24, weight 149 lb (67.6 kg), SpO2 90%.  Wt Readings from Last 3 Encounters:  06/02/23 149 lb (67.6 kg)  04/21/23 170 lb 12.8 oz (77.5 kg)  04/15/23 166 lb 0.6 oz (75.3 kg)    Body mass index is 23.34 kg/m.  Performance status (ECOG): 2 - Symptomatic, <50% confined to bed  PHYSICAL EXAM:  Physical Exam Vitals and nursing note reviewed.  Constitutional:      General: He is not in acute distress.    Appearance: Normal appearance. He is normal weight.  HENT:     Head: Normocephalic and atraumatic.     Mouth/Throat:     Mouth: Mucous membranes are moist.  Pharynx: Oropharynx is clear. No oropharyngeal exudate or posterior oropharyngeal erythema.  Eyes:     General: No scleral icterus.    Extraocular Movements: Extraocular movements intact.     Conjunctiva/sclera: Conjunctivae normal.     Pupils: Pupils are equal, round, and reactive to light.  Cardiovascular:     Rate and Rhythm: Normal rate and regular rhythm.     Heart sounds: Normal heart sounds. No murmur heard.    No friction rub. No gallop.  Pulmonary:     Effort: Pulmonary effort is normal.     Breath sounds: Normal breath sounds. No wheezing, rhonchi or rales.  Chest:     Chest wall: Tenderness (right lower ribs) present. No mass, lacerations, deformity or swelling.  Abdominal:     General: Bowel sounds are normal. There is no distension.     Palpations: Abdomen is soft. There is no hepatomegaly, splenomegaly or mass.     Tenderness: There is no abdominal tenderness.  Musculoskeletal:        General: Normal range of motion.     Cervical back: Normal range of motion and neck supple. No tenderness.     Right lower leg: Edema (1+) present.     Left lower leg: Edema (1+) present.  Lymphadenopathy:     Cervical: No cervical adenopathy.     Upper Body:     Right upper body: No supraclavicular or axillary adenopathy.     Left upper body: No supraclavicular or axillary adenopathy.     Lower Body: No right inguinal  adenopathy. No left inguinal adenopathy.  Skin:    General: Skin is warm and dry.     Coloration: Skin is not jaundiced.     Findings: No rash.  Neurological:     Mental Status: He is alert and oriented to person, place, and time.     Cranial Nerves: No cranial nerve deficit.  Psychiatric:        Mood and Affect: Mood normal.        Behavior: Behavior normal.        Thought Content: Thought content normal.     LABS:      Latest Ref Rng & Units 06/02/2023    2:32 PM 04/10/2023    2:36 PM 03/09/2023    1:44 PM  CBC  WBC 4.0 - 10.5 K/uL 4.4  4.2  3.5   Hemoglobin 13.0 - 17.0 g/dL 54.0  98.1  9.8   Hematocrit 39.0 - 52.0 % 36.0  33.9  32.5   Platelets 150 - 400 K/uL 188  134  120       Latest Ref Rng & Units 06/02/2023    2:32 PM 04/10/2023    2:36 PM 03/09/2023    1:44 PM  CMP  Glucose 70 - 99 mg/dL 191  478  295   BUN 8 - 23 mg/dL 38  30  47   Creatinine 0.61 - 1.24 mg/dL 6.21  3.08  6.57   Sodium 135 - 145 mmol/L 141  142  140   Potassium 3.5 - 5.1 mmol/L 4.0  4.2  4.3   Chloride 98 - 111 mmol/L 105  105  105   CO2 22 - 32 mmol/L 25  27  26    Calcium 8.9 - 10.3 mg/dL 8.8  9.3  9.0   Total Protein 6.5 - 8.1 g/dL 7.1  7.3  7.1   Total Bilirubin 0.3 - 1.2 mg/dL 0.9  0.6  0.8   Alkaline Phos  38 - 126 U/L 138  101  89   AST 15 - 41 U/L 18  14  18    ALT 0 - 44 U/L 15  14  23       No results found for: "CEA1", "CEA" / No results found for: "CEA1", "CEA" No results found for: "PSA1" No results found for: "ZOX096" No results found for: "CAN125"  No results found for: "TOTALPROTELP", "ALBUMINELP", "A1GS", "A2GS", "BETS", "BETA2SER", "GAMS", "MSPIKE", "SPEI"  Lab Results  Component Value Date   TIBC 295 02/26/2023   TIBC 337 02/23/2023   TIBC 321 09/24/2022   FERRITIN 118 02/26/2023   FERRITIN 64 02/23/2023   FERRITIN 108 09/24/2022   IRONPCTSAT 15 02/26/2023   IRONPCTSAT 11 (L) 02/23/2023   IRONPCTSAT 16 (L) 09/24/2022   Lab Results  Component Value Date   LDH 120  03/09/2023   LDH 132 02/23/2023   LDH 108 09/24/2022    STUDIES:  PCV ECHOCARDIOGRAM COMPLETE 04/14/2023   Narrative Echocardiogram 04/14/2023: Left ventricle cavity is severely dilated. Mild concentric hypertrophy of the left ventricle. Severe global hypokinesis. LVEF <20%. No obvious LV thrombus noted on this noncontrast study.  Doppler evidence of grade II (pseudonormal) diastolic dysfunction, elevated LAP. Device lead seen in RA/RV. Left atrial cavity is severely dilated. Right atrial cavity is severely dilated. Native trileaflet aortic valve. No evidence of aortic stenosis. Mild to moderate aortic regurgitation. Moderate to severe mitral regurgitation. Severe tricuspid regurgitation. Pulmonary hypertension. Estimated pulmonary artery systolic pressure 73 mmHg. Small to moderate circumferential pericardial effusion. Previous study on 01/02/2022 reported mild to moderate tricuspid regurgitation, estimated PASP 55 mmHg.   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)    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)    Thrombocytopenia (HCC) 07/29/2022    Past Surgical History:  Procedure Laterality Date   APPENDECTOMY     CARDIAC CATHETERIZATION     CORONARY ANGIOPLASTY     PACEMAKER INSERTION     RIGHT/LEFT HEART CATH AND CORONARY ANGIOGRAPHY N/A 10/09/2021    Procedure: RIGHT/LEFT HEART CATH AND CORONARY ANGIOGRAPHY;  Surgeon: Elder Negus, MD;  Location: [facility] INVASIVE CV LAB;  Service: Cardiovascular;  Laterality: N/A;    Family History  Problem Relation Age of Onset   Breast cancer Mother    Diabetes Mellitus II Father    Ovarian cancer Sister    Cancer Brother    Cancer Brother    Cancer Maternal Uncle    Breast cancer Niece     Social History:  reports that he has been smoking cigarettes. He has a 30 pack-year smoking history. He has never used smokeless tobacco. He reports that he does not currently use alcohol. He reports that he does not currently use drugs.The patient is alone today.  Allergies: No Known Allergies  Current Medications: Current Outpatient Medications  Medication Sig Dispense Refill   albuterol (PROVENTIL) (  2.5 MG/3ML) 0.083% nebulizer solution Take by nebulization.     albuterol (VENTOLIN HFA) 108 (90 Base) MCG/ACT inhaler Inhale into the lungs.     ALBUTEROL SULFATE PO Take by mouth.     amiodarone (PACERONE) 200 MG tablet Take 0.5 tablets (100 mg total) by mouth daily. 30 tablet 5   apixaban (ELIQUIS) 5 MG TABS tablet Take 1 tablet (5 mg total) by mouth 2 (two) times daily. 180 tablet 3   atorvastatin (LIPITOR) 80 MG tablet Take 1 tablet (80 mg total) by mouth at bedtime. 90 tablet 3   Fe Fum-FA-B Cmp-C-Zn-Mg-Mn-Cu (HEMOCYTE PLUS) 106-1 MG CAPS Take 1 capsule by mouth daily.     furosemide (LASIX) 40 MG tablet TAKE 1 TABLET EVERY OTHER DAY 45 tablet 3   magnesium oxide (MAG-OX) 400 (240 Mg) MG tablet TAKE 1 TABLET BY MOUTH TWICE A DAY 180 tablet 1   metFORMIN (GLUCOPHAGE) 500 MG tablet Take 500 mg by mouth 2 (two) times daily.     metoprolol succinate (TOPROL-XL) 25 MG 24 hr tablet TAKE 0.5 TABLET (12.5 MG TOTAL) BY MOUTH DAILY. 45 tablet 3   nicotine (NICODERM CQ - DOSED IN MG/24 HOURS) 14 mg/24hr patch PLACE 1 PATCH ONTO THE SKIN DAILY. 28 patch 0   spironolactone (ALDACTONE) 25 MG tablet TAKE 1 TABLET BY  MOUTH EVERY DAY IN THE MORNING 90 tablet 1   SYMBICORT 80-4.5 MCG/ACT inhaler Inhale into the lungs.     TRUE METRIX BLOOD GLUCOSE TEST test strip      TRUEplus Lancets 33G MISC      No current facility-administered medications for this visit.

## 2023-06-01 DIAGNOSIS — I5040 Unspecified combined systolic (congestive) and diastolic (congestive) heart failure: Secondary | ICD-10-CM | POA: Diagnosis not present

## 2023-06-01 DIAGNOSIS — J9611 Chronic respiratory failure with hypoxia: Secondary | ICD-10-CM | POA: Diagnosis not present

## 2023-06-01 DIAGNOSIS — J449 Chronic obstructive pulmonary disease, unspecified: Secondary | ICD-10-CM | POA: Diagnosis not present

## 2023-06-01 NOTE — Assessment & Plan Note (Signed)
His B12 injections have been given sporadically as well.  Repeat B12 level in April was normal.  He last received B12 on June 5.

## 2023-06-01 NOTE — Assessment & Plan Note (Signed)
He was found to have iron deficiency in July 2023.  He was given IV iron at that time.  His hemoglobin had dropped again in April.  Iron studies in April revealed recurrent iron deficiency, so he received a dose of Feraheme on June 5.

## 2023-06-01 NOTE — Assessment & Plan Note (Signed)
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  He had a hypermetabolic right submandibular node on PET, but no evidence of distant metastasis.  His original lesion was diagnosed in March 2016.  He underwent excision, but refused aggressive therapy.  He has been receiving palliative nivolumab since November 2016, with several interruptions. His treatment was on hold from December 2022 to April 2023 due to cardiorespiratory issues. He continues to tolerate nivolumab without difficulty.  CT imaging in August 2023 remains without evidence of metastatic disease.  However, there were increasing bilateral pleural effusions and small volume abdominal pelvic ascites, with stable pericardial effusion, felt to be most likely due to fluid overload.  He is on Entresto. CT scans in February show frank pulmonary edema, but no evidence of metastatic or recurrent melanoma.  He is following with cardiology.  Dr. Gilman Buttner recommended follow-up CT imaging in 6 months.  He received a 22nd cycle of nivolumab in March.  His 23rd cycle was delayed due to C. difficile in April.  He received 23rd cycle on June 5.  He was due for treatment on July 3, but did not keep his follow-up and labs on June 28 prior to infusion.   He has recurrent severe diarrhea, concerning for recurrent C. difficile versus immune mediated diarrhea.  I recommended holding the 24th cycle nivolumab this week, but the patient wishes to proceed if possible. I will await the results of stool tests before making a final decision.

## 2023-06-02 ENCOUNTER — Inpatient Hospital Stay: Payer: Medicare PPO | Attending: Oncology | Admitting: Hematology and Oncology

## 2023-06-02 ENCOUNTER — Inpatient Hospital Stay: Payer: Medicare PPO

## 2023-06-02 VITALS — BP 117/49 | HR 78 | Temp 98.0°F | Resp 24 | Wt 149.0 lb

## 2023-06-02 DIAGNOSIS — D509 Iron deficiency anemia, unspecified: Secondary | ICD-10-CM | POA: Diagnosis not present

## 2023-06-02 DIAGNOSIS — Z79899 Other long term (current) drug therapy: Secondary | ICD-10-CM | POA: Diagnosis not present

## 2023-06-02 DIAGNOSIS — R634 Abnormal weight loss: Secondary | ICD-10-CM | POA: Diagnosis not present

## 2023-06-02 DIAGNOSIS — C4339 Malignant melanoma of other parts of face: Secondary | ICD-10-CM

## 2023-06-02 DIAGNOSIS — R197 Diarrhea, unspecified: Secondary | ICD-10-CM | POA: Insufficient documentation

## 2023-06-02 DIAGNOSIS — D519 Vitamin B12 deficiency anemia, unspecified: Secondary | ICD-10-CM | POA: Insufficient documentation

## 2023-06-02 DIAGNOSIS — Z85820 Personal history of malignant melanoma of skin: Secondary | ICD-10-CM | POA: Diagnosis not present

## 2023-06-02 LAB — CBC WITH DIFFERENTIAL (CANCER CENTER ONLY)
Abs Immature Granulocytes: 0.01 10*3/uL (ref 0.00–0.07)
Basophils Absolute: 0 10*3/uL (ref 0.0–0.1)
Basophils Relative: 1 %
Eosinophils Absolute: 0.1 10*3/uL (ref 0.0–0.5)
Eosinophils Relative: 1 %
HCT: 36 % — ABNORMAL LOW (ref 39.0–52.0)
Hemoglobin: 10.7 g/dL — ABNORMAL LOW (ref 13.0–17.0)
Immature Granulocytes: 0 %
Lymphocytes Relative: 8 %
Lymphs Abs: 0.4 10*3/uL — ABNORMAL LOW (ref 0.7–4.0)
MCH: 29.4 pg (ref 26.0–34.0)
MCHC: 29.7 g/dL — ABNORMAL LOW (ref 30.0–36.0)
MCV: 98.9 fL (ref 80.0–100.0)
Monocytes Absolute: 0.3 10*3/uL (ref 0.1–1.0)
Monocytes Relative: 7 %
Neutro Abs: 3.6 10*3/uL (ref 1.7–7.7)
Neutrophils Relative %: 83 %
Platelet Count: 188 10*3/uL (ref 150–400)
RBC: 3.64 MIL/uL — ABNORMAL LOW (ref 4.22–5.81)
RDW: 15.9 % — ABNORMAL HIGH (ref 11.5–15.5)
WBC Count: 4.4 10*3/uL (ref 4.0–10.5)
nRBC: 0 % (ref 0.0–0.2)

## 2023-06-02 LAB — CMP (CANCER CENTER ONLY)
ALT: 15 U/L (ref 0–44)
AST: 18 U/L (ref 15–41)
Albumin: 3.6 g/dL (ref 3.5–5.0)
Alkaline Phosphatase: 138 U/L — ABNORMAL HIGH (ref 38–126)
Anion gap: 11 (ref 5–15)
BUN: 38 mg/dL — ABNORMAL HIGH (ref 8–23)
CO2: 25 mmol/L (ref 22–32)
Calcium: 8.8 mg/dL — ABNORMAL LOW (ref 8.9–10.3)
Chloride: 105 mmol/L (ref 98–111)
Creatinine: 1.58 mg/dL — ABNORMAL HIGH (ref 0.61–1.24)
GFR, Estimated: 44 mL/min — ABNORMAL LOW (ref >60)
Glucose, Bld: 162 mg/dL — ABNORMAL HIGH (ref 70–99)
Potassium: 4 mmol/L (ref 3.5–5.1)
Sodium: 141 mmol/L (ref 135–145)
Total Bilirubin: 0.9 mg/dL (ref 0.3–1.2)
Total Protein: 7.1 g/dL (ref 6.5–8.1)

## 2023-06-02 LAB — TSH: TSH: 1.985 u[IU]/mL (ref 0.350–4.500)

## 2023-06-02 NOTE — Assessment & Plan Note (Signed)
This is likely due to both fluid losses, as he had pulmonary edema at his last visit, but he reports decreased appetite as well.

## 2023-06-02 NOTE — Assessment & Plan Note (Signed)
Recurrent severe diarrhea which may represent recurrent C. Difficile infection or immune-mediated diarrhea.  The patient was unable to give a stool specimen today.  He will send a specimen with a family member tomorrow.  He did not wish to cancel his infusion for Friday, but I do not feel like we will treat him either way.

## 2023-06-03 ENCOUNTER — Encounter: Payer: Self-pay | Admitting: Hematology and Oncology

## 2023-06-03 ENCOUNTER — Encounter: Payer: Self-pay | Admitting: Oncology

## 2023-06-03 ENCOUNTER — Other Ambulatory Visit: Payer: Self-pay | Admitting: Hematology and Oncology

## 2023-06-03 DIAGNOSIS — C4339 Malignant melanoma of other parts of face: Secondary | ICD-10-CM

## 2023-06-03 DIAGNOSIS — R197 Diarrhea, unspecified: Secondary | ICD-10-CM | POA: Diagnosis not present

## 2023-06-03 MED ORDER — METRONIDAZOLE 500 MG PO TABS: 500.0000 mg | ORAL_TABLET | Freq: Three times a day (TID) | ORAL | 0 refills | Status: DC

## 2023-06-04 ENCOUNTER — Encounter: Payer: Self-pay | Admitting: Oncology

## 2023-06-04 ENCOUNTER — Telehealth: Payer: Self-pay | Admitting: Hematology and Oncology

## 2023-06-04 LAB — T4: T4, Total: 9.1 ug/dL (ref 4.5–12.0)

## 2023-06-04 NOTE — Telephone Encounter (Signed)
06/04/23 Spoke with patients daughter(Shelley)and scheduled next appts

## 2023-06-05 ENCOUNTER — Ambulatory Visit: Payer: Medicare PPO

## 2023-06-10 ENCOUNTER — Emergency Department (HOSPITAL_COMMUNITY)
Admission: EM | Admit: 2023-06-10 | Discharge: 2023-06-10 | Disposition: A | Discharge status: Home / Self Care | Payer: Medicare PPO

## 2023-06-10 ENCOUNTER — Encounter (HOSPITAL_COMMUNITY): Payer: Self-pay

## 2023-06-10 ENCOUNTER — Emergency Department (HOSPITAL_COMMUNITY): Payer: Medicare PPO

## 2023-06-10 ENCOUNTER — Other Ambulatory Visit: Payer: Self-pay

## 2023-06-10 DIAGNOSIS — Z4502 Encounter for adjustment and management of automatic implantable cardiac defibrillator: Secondary | ICD-10-CM | POA: Diagnosis not present

## 2023-06-10 DIAGNOSIS — Z79899 Other long term (current) drug therapy: Secondary | ICD-10-CM | POA: Insufficient documentation

## 2023-06-10 DIAGNOSIS — R079 Chest pain, unspecified: Secondary | ICD-10-CM | POA: Diagnosis not present

## 2023-06-10 DIAGNOSIS — Y712 Prosthetic and other implants, materials and accessory cardiovascular devices associated with adverse incidents: Secondary | ICD-10-CM | POA: Diagnosis not present

## 2023-06-10 DIAGNOSIS — Z7984 Long term (current) use of oral hypoglycemic drugs: Secondary | ICD-10-CM | POA: Diagnosis not present

## 2023-06-10 DIAGNOSIS — I5022 Chronic systolic (congestive) heart failure: Secondary | ICD-10-CM | POA: Diagnosis not present

## 2023-06-10 DIAGNOSIS — E119 Type 2 diabetes mellitus without complications: Secondary | ICD-10-CM | POA: Diagnosis not present

## 2023-06-10 DIAGNOSIS — Z1152 Encounter for screening for COVID-19: Secondary | ICD-10-CM | POA: Diagnosis not present

## 2023-06-10 DIAGNOSIS — I255 Ischemic cardiomyopathy: Secondary | ICD-10-CM | POA: Diagnosis not present

## 2023-06-10 DIAGNOSIS — Z7901 Long term (current) use of anticoagulants: Secondary | ICD-10-CM | POA: Diagnosis not present

## 2023-06-10 DIAGNOSIS — J449 Chronic obstructive pulmonary disease, unspecified: Secondary | ICD-10-CM | POA: Insufficient documentation

## 2023-06-10 DIAGNOSIS — I447 Left bundle-branch block, unspecified: Secondary | ICD-10-CM | POA: Diagnosis not present

## 2023-06-10 DIAGNOSIS — T82897A Other specified complication of cardiac prosthetic devices, implants and grafts, initial encounter: Secondary | ICD-10-CM | POA: Diagnosis not present

## 2023-06-10 DIAGNOSIS — I110 Hypertensive heart disease with heart failure: Secondary | ICD-10-CM | POA: Insufficient documentation

## 2023-06-10 DIAGNOSIS — R0602 Shortness of breath: Secondary | ICD-10-CM | POA: Diagnosis not present

## 2023-06-10 DIAGNOSIS — J189 Pneumonia, unspecified organism: Secondary | ICD-10-CM | POA: Insufficient documentation

## 2023-06-10 DIAGNOSIS — R918 Other nonspecific abnormal finding of lung field: Secondary | ICD-10-CM | POA: Diagnosis not present

## 2023-06-10 DIAGNOSIS — I10 Essential (primary) hypertension: Secondary | ICD-10-CM | POA: Diagnosis not present

## 2023-06-10 DIAGNOSIS — Z4682 Encounter for fitting and adjustment of non-vascular catheter: Secondary | ICD-10-CM | POA: Diagnosis not present

## 2023-06-10 DIAGNOSIS — J9611 Chronic respiratory failure with hypoxia: Secondary | ICD-10-CM | POA: Diagnosis not present

## 2023-06-10 DIAGNOSIS — Z950 Presence of cardiac pacemaker: Secondary | ICD-10-CM | POA: Diagnosis not present

## 2023-06-10 DIAGNOSIS — R0789 Other chest pain: Secondary | ICD-10-CM | POA: Diagnosis not present

## 2023-06-10 DIAGNOSIS — J168 Pneumonia due to other specified infectious organisms: Secondary | ICD-10-CM | POA: Diagnosis not present

## 2023-06-10 DIAGNOSIS — Z95810 Presence of automatic (implantable) cardiac defibrillator: Secondary | ICD-10-CM | POA: Diagnosis not present

## 2023-06-10 DIAGNOSIS — I959 Hypotension, unspecified: Secondary | ICD-10-CM | POA: Diagnosis not present

## 2023-06-10 LAB — RESP PANEL BY RT-PCR (RSV, FLU A&B, COVID)  RVPGX2
Influenza A by PCR: NEGATIVE
Influenza B by PCR: NEGATIVE
Resp Syncytial Virus by PCR: NEGATIVE
SARS Coronavirus 2 by RT PCR: NEGATIVE

## 2023-06-10 LAB — COMPREHENSIVE METABOLIC PANEL
ALT: 15 U/L (ref 0–44)
AST: 21 U/L (ref 15–41)
Albumin: 3.3 g/dL — ABNORMAL LOW (ref 3.5–5.0)
Alkaline Phosphatase: 116 U/L (ref 38–126)
Anion gap: 10 (ref 5–15)
BUN: 28 mg/dL — ABNORMAL HIGH (ref 8–23)
CO2: 22 mmol/L (ref 22–32)
Calcium: 9 mg/dL (ref 8.9–10.3)
Chloride: 108 mmol/L (ref 98–111)
Creatinine, Ser: 1.39 mg/dL — ABNORMAL HIGH (ref 0.61–1.24)
GFR, Estimated: 51 mL/min — ABNORMAL LOW (ref >60)
Glucose, Bld: 108 mg/dL — ABNORMAL HIGH (ref 70–99)
Potassium: 4.4 mmol/L (ref 3.5–5.1)
Sodium: 140 mmol/L (ref 135–145)
Total Bilirubin: 0.8 mg/dL (ref 0.3–1.2)
Total Protein: 6.6 g/dL (ref 6.5–8.1)

## 2023-06-10 LAB — CBC WITH DIFFERENTIAL/PLATELET
Abs Immature Granulocytes: 0.02 10*3/uL (ref 0.00–0.07)
Basophils Absolute: 0.1 10*3/uL (ref 0.0–0.1)
Basophils Relative: 1 %
Eosinophils Absolute: 0.1 10*3/uL (ref 0.0–0.5)
Eosinophils Relative: 1 %
HCT: 37.3 % — ABNORMAL LOW (ref 39.0–52.0)
Hemoglobin: 11.5 g/dL — ABNORMAL LOW (ref 13.0–17.0)
Immature Granulocytes: 0 %
Lymphocytes Relative: 9 %
Lymphs Abs: 0.5 10*3/uL — ABNORMAL LOW (ref 0.7–4.0)
MCH: 29.9 pg (ref 26.0–34.0)
MCHC: 30.8 g/dL (ref 30.0–36.0)
MCV: 96.9 fL (ref 80.0–100.0)
Monocytes Absolute: 0.4 10*3/uL (ref 0.1–1.0)
Monocytes Relative: 8 %
Neutro Abs: 4.4 10*3/uL (ref 1.7–7.7)
Neutrophils Relative %: 81 %
Platelets: 207 10*3/uL (ref 150–400)
RBC: 3.85 MIL/uL — ABNORMAL LOW (ref 4.22–5.81)
RDW: 15.9 % — ABNORMAL HIGH (ref 11.5–15.5)
WBC: 5.5 10*3/uL (ref 4.0–10.5)
nRBC: 0 % (ref 0.0–0.2)

## 2023-06-10 LAB — TROPONIN I (HIGH SENSITIVITY)
Troponin I (High Sensitivity): 21 ng/L — ABNORMAL HIGH (ref <18)
Troponin I (High Sensitivity): 21 ng/L — ABNORMAL HIGH (ref <18)

## 2023-06-10 LAB — MAGNESIUM: Magnesium: 1.8 mg/dL (ref 1.7–2.4)

## 2023-06-10 MED ORDER — IRBESARTAN 75 MG PO TABS
75.0000 mg | ORAL_TABLET | Freq: Every day | ORAL | Status: DC
  Administered 2023-06-10: 75 mg via ORAL
  Filled 2023-06-10: qty 1

## 2023-06-10 MED ORDER — AMOXICILLIN-POT CLAVULANATE 875-125 MG PO TABS: 1.0000 | ORAL_TABLET | Freq: Two times a day (BID) | ORAL | 0 refills | Status: DC

## 2023-06-10 MED ORDER — METOPROLOL SUCCINATE ER 25 MG PO TB24
50.0000 mg | ORAL_TABLET | Freq: Every day | ORAL | Status: DC
  Administered 2023-06-10: 50 mg via ORAL
  Filled 2023-06-10: qty 2

## 2023-06-10 MED ORDER — SODIUM CHLORIDE 0.9 % IV SOLN
500.0000 mg | Freq: Once | INTRAVENOUS | Status: AC
  Administered 2023-06-10: 500 mg via INTRAVENOUS
  Filled 2023-06-10: qty 5

## 2023-06-10 MED ORDER — SODIUM CHLORIDE 0.9 % IV SOLN
1.0000 g | Freq: Once | INTRAVENOUS | Status: AC
  Administered 2023-06-10: 1 g via INTRAVENOUS
  Filled 2023-06-10: qty 10

## 2023-06-10 NOTE — Consult Note (Addendum)
CARDIOLOGY CONSULT NOTE  Patient ID: Kevin [name] MRN: [id number] DOB/AGE: 13-Jul-1941 82 y.o.  Admit date: 06/10/2023 Referring Physician  Estanislado Pandy, DO Primary Physician:  Lonie Peak, PA-C Reason for Consultation  ICD discharge & Dyspnea  Patient ID: Kevin [name], [name]    DOB: 02-16-1941, 82 y.o.   MRN: [id number]  Chief Complaint  Patient presents with   AICD Problem   HPI:    Kevin [name]  is a 82 y.o. Caucasian [name]  with hypertension, type 2 DM, COPD with ongoing tobacco use and severe nicotine dependence, CAD s/p  NSTEMI and PCI (2015  3 x 24 mm DES to mid LAD), ischemic cardiomyopathy with previously known severe LV systolic dysfunction, chronic bilateral leg edema, h/o ventricular tachycardia SP Abbott dual-chamber ICD implantation  in 2015. Paroxysmal atrial fibrillation and presently on long-term anticoagulation with Eliquis after he presented with 1 episode of A-fib with RVR, respiratory failure and COPD exacerbation in November 2022, stage IIIa chronic kidney disease, melanoma-currently on monthly chemotherapy.   Patient presented via EMS stating that his ICD has discharged and also having shortness of breath.  Upon my entry into the room, patient states that he has had no ICD discharge, yesterday and today he had sharp chest pain on the right side of the chest lasting a few seconds, he wanted to get it checked out.  States that his dyspnea has been stable.  No fever or chills or cough.  No PND although states that he has had difficulty sleeping nowadays.  No changes leg edema.  No fever or chills.  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)    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)    Thrombocytopenia (HCC) 07/29/2022   Past Surgical History:  Procedure Laterality Date   APPENDECTOMY     CARDIAC CATHETERIZATION     CORONARY ANGIOPLASTY     PACEMAKER INSERTION     RIGHT/LEFT HEART CATH AND CORONARY ANGIOGRAPHY N/A 10/09/2021   Procedure: RIGHT/LEFT HEART CATH AND CORONARY ANGIOGRAPHY;  Surgeon: Elder Negus, MD;  Location: [facility];  Service: Cardiovascular;  Laterality: N/A;   Social History   Tobacco Use   Smoking status: Some Days    Current packs/day: 0.50    Average packs/day: 0.5 packs/day for 60.0 years (30.0 ttl pk-yrs)    Types: Cigarettes   Smokeless tobacco: Never  Substance Use Topics   Alcohol use: Not Currently    Family History  Problem Relation Age of Onset   Breast cancer Mother    Diabetes Mellitus II Father    Ovarian cancer Sister    Cancer Brother    Cancer Brother    Cancer Maternal Uncle    Breast cancer Niece     Marital Status: Married  ROS  Review of Systems  Constitutional: Positive for malaise/fatigue. Negative for fever.  Cardiovascular:  Positive for chest pain and dyspnea on exertion. Negative for  leg swelling.  Respiratory:  Positive for cough (chronic).    Objective      06/10/2023   12:38 PM 06/02/2023    3:00 PM 06/02/2023    2:57 PM  Vitals with BMI  Height 5\' 7"     Weight 130 lbs 149 lbs 148 lbs 2 oz  BMI 20.36    Systolic 134  117  Diastolic 120  49  Pulse 81  78    Blood pressure (!) 134/120, pulse 81, temperature 98.1 F (36.7 C), temperature source Oral, resp. rate 20, height 5\' 7"  (1.702 m), weight 59 kg, SpO2 93%.  Physical Exam Constitutional:      General: He is not in  acute distress.    Appearance: He is ill-appearing. He is not toxic-appearing.  Neck:     Vascular: JVD present. No carotid bruit.  Cardiovascular:     Rate and Rhythm: Normal rate and regular rhythm.     Pulses:          Dorsalis pedis pulses are 0 on the right side and 0 on the left side.       Posterior tibial pulses are 0 on the right side and 0 on the left side.     Heart sounds: Heart sounds are distant. No murmur heard.    No gallop.  Pulmonary:     Effort: Pulmonary effort is normal.     Breath sounds: Decreased air movement present. Rales (scattered) present.  Abdominal:     General: Bowel sounds are normal.     Palpations: Abdomen is soft.  Musculoskeletal:     Right lower leg: No edema.     Left lower leg: No edema.    Laboratory examination:   Recent Labs    04/10/23 1436 06/02/23 1432 06/10/23 1233  NA 142 141 140  K 4.2 4.0 4.4  CL 105 105 108  CO2 27 25 22   GLUCOSE 180* 162* 108*  BUN 30* 38* 28*  CREATININE 1.46* 1.58* 1.39*  CALCIUM 9.3 8.8* 9.0  GFRNONAA 48* 44* 51*   estimated creatinine clearance is 34.8 mL/min (A) (by C-G formula based on SCr of 1.39 mg/dL (H)).     Latest Ref Rng & Units 06/10/2023   12:33 PM 06/02/2023    2:32 PM 04/10/2023    2:36 PM  CMP  Glucose 70 - 99 mg/dL 409  811  914   BUN 8 - 23 mg/dL 28  38  30   Creatinine 0.61 - 1.24 mg/dL 7.82  9.56  2.13   Sodium 135 - 145 mmol/L 140  141  142   Potassium 3.5 - 5.1 mmol/L 4.4  4.0  4.2   Chloride 98 - 111 mmol/L 108  105  105   CO2 22 - 32 mmol/L 22  25  27    Calcium 8.9 - 10.3 mg/dL 9.0  8.8  9.3   Total Protein 6.5 - 8.1 g/dL 6.6  7.1  7.3   Total Bilirubin 0.3 - 1.2 mg/dL 0.8  0.9  0.6   Alkaline Phos 38 - 126 U/L 116  138  101   AST 15 - 41 U/L 21  18  14    ALT 0 - 44 U/L 15  15  14        Latest Ref Rng & Units 06/10/2023   12:33 PM 06/02/2023    2:32 PM 04/10/2023    2:36 PM  CBC  WBC 4.0 - 10.5 K/uL 5.5  4.4  4.2  Hemoglobin 13.0 - 17.0 g/dL 45.4  09.8  11.9    Hematocrit 39.0 - 52.0 % 37.3  36.0  33.9   Platelets 150 - 400 K/uL 207  188  134    Lipid Panel Recent Labs    02/26/23 0000 02/26/23 1424  CHOL  --  107  TRIG  --  49  LDLCALC 153 35  HDL  --  60    HEMOGLOBIN J4N Lab Results  Component Value Date   HGBA1C 6.7 (H) 10/07/2021   MPG 146 10/07/2021   TSH Recent Labs    03/09/23 1344 04/10/23 1435 06/02/23 1432  TSH 4.232 3.443 1.985   BNP (last 3 results) Recent Labs    06/12/22 1330 02/26/23 1424  BNP 983.0* 1,423.8*   Cardiac Panel (last 3 results) Recent Labs    06/10/23 1233  TROPONINIHS 21*     Medications and allergies  No Known Allergies  urrent Outpatient Medications:    albuterol (PROVENTIL) (2.5 MG/3ML) 0.083% nebulizer solution, Take by nebulization., Disp: , Rfl:    albuterol (VENTOLIN HFA) 108 (90 Base) MCG/ACT inhaler, Inhale into the lungs., Disp: , Rfl:    ALBUTEROL SULFATE PO, Take by mouth., Disp: , Rfl:    amiodarone (PACERONE) 200 MG tablet, Take 0.5 tablets (100 mg total) by mouth daily., Disp: 30 tablet, Rfl: 5   apixaban (ELIQUIS) 5 MG TABS tablet, Take 1 tablet (5 mg total) by mouth 2 (two) times daily., Disp: 180 tablet, Rfl: 3   atorvastatin (LIPITOR) 80 MG tablet, Take 1 tablet (80 mg total) by mouth at bedtime., Disp: 90 tablet, Rfl: 3   Fe Fum-FA-B Cmp-C-Zn-Mg-Mn-Cu (HEMOCYTE PLUS) 106-1 MG CAPS, Take 1 capsule by mouth daily., Disp: , Rfl:    furosemide (LASIX) 40 MG tablet, TAKE 1 TABLET EVERY OTHER DAY, Disp: 45 tablet, Rfl: 3   magnesium oxide (MAG-OX) 400 (240 Mg) MG tablet, TAKE 1 TABLET BY MOUTH TWICE A DAY, Disp: 180 tablet, Rfl: 1   metFORMIN (GLUCOPHAGE) 500 MG tablet, Take 500 mg by mouth 2 (two) times daily., Disp: , Rfl:    metoprolol succinate (TOPROL-XL) 25 MG 24 hr tablet, TAKE 0.5 TABLET (12.5 MG TOTAL) BY MOUTH DAILY., Disp: 45 tablet, Rfl: 3   nicotine (NICODERM CQ) 14 mg/24hr patch, Place 1 patch (14 mg total) onto the skin daily., Disp: 28 patch, Rfl: 0    SYMBICORT 80-4.5 MCG/ACT inhaler, Inhale into the lungs., Disp: , Rfl:    TRUE METRIX BLOOD GLUCOSE TEST test strip, , Disp: , Rfl:    TRUEplus Lancets 33G MISC, , Disp: , Rfl:    spironolactone (ALDACTONE) 25 MG tablet, TAKE 1 TABLET BY MOUTH EVERY DAY IN THE MORNING, Disp: 90 tablet, Rfl: 1  Radiology:   DG Chest Port 1 [name]  Result Date: 06/10/2023 CLINICAL DATA:  chest pain EXAM: PORTABLE CHEST 1 [name] COMPARISON:  10/17/2021. FINDINGS: There are heterogeneous opacities overlying the bilateral lower lobes and lateral aspect of the right middle lobe. Findings favor multilobar pneumonia. There is associated prominence of interstitial markings, which may represent superimposed pulmonary edema. Correlate clinically. There is blunting of left lateral costophrenic angle, suggesting left pleural effusion. Right lateral costophrenic angle is grossly clear. Mildly enlarged cardio-mediastinal silhouette. No acute osseous abnormalities. The soft tissues are within normal limits. There is a left sided 2-lead pacemaker. CT Port-A-Cath is seen overlying the right chest wall with the catheter terminating in the midportion of superior vena cava. IMPRESSION: Multilobar pneumonia. Possible superimposed pulmonary edema. Electronically  Signed   By: Jules Schick M.D.   On: 06/10/2023 14:04    Cardiac Studies:    Cardiac Studies:      Right/left heart catheterization and coronary angiography 10/09/2021: LM: Normal LAD: Patent (2015  3 x 24 mm DES) mid LAD stent. No significant disease Lcx: No significant disease RCA: Ostial 100% occlusion. Left-to-right collateral up to mid RCA      RA: 11 mmHg RV: 51/0 mmHg PA: 64/22 mmHg, mPAP 34 mmHg PCW: 23 mmHg   CO: 3.6 L/min CI: 2.0 L/min/m2   Impression: Mildly decompensated nonischemic cardiomyopathy LV size and function out of proportion to RCA occlusion and patent mid LAD stent Recommend GDMT for heart failure   PCV ECHOCARDIOGRAM COMPLETE 04/14/2023    Narrative Echocardiogram 04/14/2023: Left ventricle cavity is severely dilated. Mild concentric hypertrophy of the left ventricle. Severe global hypokinesis. LVEF <20%. No obvious LV thrombus noted on this noncontrast study.  Doppler evidence of grade II (pseudonormal) diastolic dysfunction, elevated LAP. Device lead seen in RA/RV. Left atrial cavity is severely dilated. Right atrial cavity is severely dilated. Native trileaflet aortic valve. No evidence of aortic stenosis. Mild to moderate aortic regurgitation. Moderate to severe mitral regurgitation. Severe tricuspid regurgitation. Pulmonary hypertension. Estimated pulmonary artery systolic pressure 73 mmHg. Small to moderate circumferential pericardial effusion. Previous study on 01/02/2022 reported mild to moderate tricuspid regurgitation, estimated PASP 55 mmHg.   ICD: Saint Jude/Abbott dual-chamber defibrillator    In person check 06/10/23   Single (S)/Dual (D)/BV (M) D Presenting ASVS Pacer dependant: No. Underlying NSR. AP 1.7 %, VP 2.5 %.  AMS Episodes Brief.  AT/AF burden 1%. Longest 30 seconds.  False mode switches due to atrial artifact. HVR 0.   Frequent PVCs, probable PVC burden around 20%, reported 3.2%. Longevity 1. Years/Voltage.  Lead measurements: Stable Histogram: Low (L)/normal (N)/high (H)  Normal Patient activity Normal. Thoracic impedance: Normal, and does not suggest volume overload state.   Observations: Normal dual chamber ICD function with no thoracic impedance suggestion of fluid overload state.  Changes: Moderate DDD.  Changed to a sensitivity from auto to 0.5 MV and atrial AutoSense was turned off to reduce false AMS episodes.  EKG:  EKG 06/10/2023: Normal sinus rhythm at the rate of 83 bpm, left bundle branch block.  PACs.  Compared to 08/14/2022 sinus rhythm with first-degree block and demand atrially paced rhythm and PACs were noted previously, ventricular rate was 61 bpm.  Assessment & Recommendations:    Kevin [name]  is a 82 y.o. Caucasian [name]  with hypertension, type 2 DM, COPD with ongoing tobacco use and severe nicotine dependence, CAD s/p  NSTEMI and PCI (2015  3 x 24 mm DES to mid LAD), ischemic cardiomyopathy with previously known severe LV systolic dysfunction, chronic bilateral leg edema, h/o ventricular tachycardia SP Abbott dual-chamber ICD implantation  in 2015. Paroxysmal atrial fibrillation and presently on long-term anticoagulation with Eliquis after he presented with 1 episode of A-fib with RVR, respiratory failure and COPD exacerbation in November 2022, stage IIIa chronic kidney disease, melanoma-currently on monthly chemotherapy.   1.  Chest pain appears to be musculoskeletal with flat troponin.  Episode lasting a few seconds.  Would still recommend discharging him home on nitroglycerin as needed. 2.  Dual-chamber ICD in place for ischemic cardiomyopathy and severe LV systolic dysfunction: ICD: Saint Jude/Abbott dual-chamber defibrillator 05/18/2014  I interviewed the ICD at the bedside.  He has not had any ICD discharge.  He does have increased frequency of  PVC burden around 15 to 20% based on histograms but cannot states that his PVC burden is around 3.2%.  He has had false mode switches due to RA noise, changes in the sensitivity from auto to 0.5 mV and atrial AutoSense was turned off. 3.  Chronic systolic heart failure, presently well compensated.  No clinical evidence of heart failure, thoracic impedance via ICD evaluation reveals fairly high impedance suggesting dry fluid balance.  Also dyspnea is chronic and stable and no leg edema.  JVD is difficult to make out in [name] of severe COPD.    Patient on his last office visit with me was counseled against tobacco use, fortunately patient has reduced smoking significantly, although nicotine patches were prescribed, he stopped using them after 1 day as it burned his skin and since last office visit a month ago he has smoked  about 3 cigarettes none in the last 3 days.  I have congratulated him.  4.  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  He did drop his saturation while ambulating in the hallway but does not like to use oxygen as tubes get tangled up. Management as per pulmonary medicine.  Presently on inhalers.  He is trying hard to quit smoking as dictated above.  5.  Primary hypertension Patient's blood pressure is high, he is on 25 mg of metoprolol, in [name] of frequent PVCs and PACs, will increase the dose to 50 mg daily.  In [name] of cardiomyopathy, I will add valsartan 80 mg daily as well upon discharge, discussed with ED physicians. I will follow up in the OP basis with repeat BMP in [name] of stage 3a CKD.  From cardiac standpoint no indication for admission to the hospital, please call if questions.    Yates Decamp, MD, [facility] 06/10/2023, 2:48 PM Office: [phone]

## 2023-06-10 NOTE — ED Triage Notes (Signed)
Pt BIB [facility] from home. Pt presents with his AICD has been shocking him since yesterday. EMS witnessed pt go into V-fib x3 and the AICD converted pt during transport. Pt is A/Ox4 and denies CP or ShOB.   EMS Vitals  110/72 HR 86 SpO2 90% CBG 127

## 2023-06-10 NOTE — Discharge Instructions (Addendum)
You may have an infection in your chest.  Follow up with your primary care provider.  Dr. Jacinto Halim has modified your medications.  Please take your medications as he has direced.

## 2023-06-10 NOTE — Consult Note (Signed)
ER Consult   Patient: Kevin [name]:[id number] DOB: 1941-06-29 DOA: 06/10/2023 DOS: the patient was seen and examined on 06/10/2023 PCP: Lonie Peak, PA-C  Patient coming from: Home - lives with family    Chief Complaint: AICD shocks  HPI: Kevin [name] is a 82 y.o. 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  The patient currently reports that he had R-sided chest wall pain adjacent to his port and he was concerned that he was having a heart attack.  Denies AICD shocks, "not in a long time."  He has chronic SOB, wears home O2, currently on RA, reports this is stable.  No change in cough, URI symptoms.  No current concerns.    ER Course:  Fatigue, SOB.  Review of AICD, no firing and vfib.  Significant ectopy noted on monitoring.  CXR with multilobar PNA, no leukocytosis, 95% on RA, wears O2 at home.  Cardiology says he could home.     Review of Systems: As mentioned in the history of present illness. All other systems reviewed and are negative. 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 He has a 30 pack-year smoking history. He has never used smokeless tobacco. He reports that he does not currently use alcohol. He reports that he does not currently use drugs.  No Known Allergies  Family History  Problem Relation Age of Onset   Breast cancer Mother    Diabetes Mellitus II Father    Ovarian cancer Sister    Cancer Brother    Cancer Brother    Cancer Maternal Uncle    Breast cancer Niece     Prior to Admission medications   Medication Sig Start Date End Date Taking? Authorizing Provider  amiodarone (PACERONE) 200 MG tablet Take 0.5 tablets (100 mg total) by mouth daily. 01/07/23 01/02/24 Yes Tala Eber Decamp, MD  apixaban (ELIQUIS) 5 MG TABS tablet Take 1 tablet (5 mg total) by mouth 2 (two) times daily. 01/06/23 01/01/24 Yes Ginna Schuur Decamp, MD  atorvastatin (LIPITOR) 80 MG tablet Take 1 tablet (80 mg total) by mouth at bedtime. 01/06/23  Yes Jackey Housey Decamp, MD  furosemide (LASIX) 40 MG tablet TAKE 1 TABLET EVERY OTHER DAY 04/03/23  Yes Jacqualine Weichel Decamp, MD  magnesium oxide (MAG-OX) 400 (240 Mg) MG tablet  TAKE 1 TABLET BY MOUTH TWICE A DAY Patient taking differently: Take 400 mg by mouth 2 (two) times daily. 09/11/22  Yes Dellia Beckwith, MD  metFORMIN (GLUCOPHAGE) 500 MG tablet Take 500 mg by mouth 2 (two) times daily. 09/29/22  Yes [provider]  metoprolol succinate (TOPROL-XL) 25 MG 24 hr tablet TAKE 0.5 TABLET (12.5 MG TOTAL) BY MOUTH DAILY. Patient taking differently: Take 12.5 mg by mouth daily. 04/10/23  Yes Kristen Fromm Decamp, MD  metroNIDAZOLE (FLAGYL) 500 MG tablet Take 1 tablet (500 mg total) by mouth 3 (three) times daily. 06/03/23  Yes Mosher,  Harvin Hazel A, PA-C  spironolactone (ALDACTONE) 25 MG tablet TAKE 1 TABLET BY MOUTH EVERY DAY IN THE MORNING Patient taking differently: Take 25 mg by mouth daily. 04/21/23  Yes Kore Madlock Decamp, MD  nicotine (NICODERM CQ - DOSED IN MG/24 HOURS) 14 mg/24hr patch PLACE 1 PATCH ONTO THE SKIN DAILY. Patient not taking: Reported on 06/10/2023 05/25/23   Charnee Turnipseed Decamp, MD    Physical Exam: Vitals:   06/10/23 1238 06/10/23 1430 06/10/23 1543  BP: (!) 134/120 109/79 119/76  Pulse: 81 79 80  Resp: 20 19   Temp: 98.1 F (36.7 C)    TempSrc: Oral    SpO2: 93% 94%   Weight: 59 kg    Height: 5\' 7"  (1.702 m)     General:  Appears calm and comfortable and is in NAD Eyes:  EOMI, normal lids, iris ENT:  grossly normal hearing, lips & tongue, mmm Neck:  no LAD, masses or thyromegaly Cardiovascular:  RRR, no m/r/g. No LE edema.  Respiratory:   CTA bilaterally with no wheezes/rales/rhonchi.  Normal respiratory effort. Abdomen:  soft, NT, ND Skin:  no rash or induration seen on limited exam Musculoskeletal:  grossly normal tone BUE/BLE, good ROM, no bony abnormality Psychiatric:  grossly normal mood and affect, speech fluent and appropriate, AOx3 Neurologic:  CN 2-12 grossly intact, moves all extremities in coordinated fashion    Radiological Exams on Admission: Independently reviewed - see discussion in A/P where applicable  DG Chest Port 1 View  Result Date: 06/10/2023 CLINICAL DATA:  chest pain EXAM: PORTABLE CHEST 1 VIEW COMPARISON:  10/17/2021. FINDINGS: There are heterogeneous opacities overlying the bilateral lower lobes and lateral aspect of the right middle lobe. Findings favor multilobar pneumonia. There is associated prominence of interstitial markings, which may represent superimposed pulmonary edema. Correlate clinically. There is blunting of left lateral costophrenic angle, suggesting left pleural effusion. Right lateral costophrenic angle is grossly clear. Mildly enlarged cardio-mediastinal  silhouette. No acute osseous abnormalities. The soft tissues are within normal limits. There is a left sided 2-lead pacemaker. CT Port-A-Cath is seen overlying the right chest wall with the catheter terminating in the midportion of superior vena cava. IMPRESSION: Multilobar pneumonia. Possible superimposed pulmonary edema. Electronically Signed   By: Jules Schick M.D.   On: 06/10/2023 14:04    EKG: Independently reviewed.  NSR with rate 83; prolonged QTc 552; IVCD; LVH; nonspecific ST changes with no evidence of acute ischemia   Labs on Admission: I have personally reviewed the available labs and imaging studies at the time of the admission.  Pertinent labs:    Glucose 108 BUN 28/Creatinine 1.39/GFR 51 - stable Unremarkable CBC COVID/flu/RSV pending   Assessment and Plan: Active Problems:   Chest wall pain   Chest wall pain Patient initially thought to have AICD shocks with vfib with EMS However, interrogation of device is negative for this, only shows ectopy CXR with  PNA vs. Pulmonary edema He appears to be at his respiratory baseline Normal/unremarkable labs This appears to be a worried well situation Patient does not appear to need admission at this time Cardiology has consulted and agrees Will defer to EDP about whether ongoing antibiotics are needed   Thank you for this interesting consult.  [facility] will be happy to reconsult should new issues arise.    Author: Jonah Blue, MD 06/10/2023 4:31 PM  For on call review [email or web address].

## 2023-06-10 NOTE — ED Provider Notes (Signed)
Pt's care assumed by me at 3:30pm.  Pt awaiting medicine consult for admission.  Pt has been seen by Cardiology.   Dr. Ophelia Charter Hospitalist evaluated pt and advised outpatient treatment.  Pt given IV antibiotics in ED.  Rx for Augmentin.  Pt is advised to follow up with primary care for recheck.  Pt will return to ED if symptoms worsen or change.   Elson Areas, Cordelia Poche 06/10/23 1710    Glyn Ade, MD 06/11/23 [phone]

## 2023-06-10 NOTE — ED Provider Notes (Addendum)
[place] EMERGENCY DEPARTMENT AT [facility] Provider Note   CSN: [id number] Arrival date & time: 06/10/23  1225     History  Chief Complaint  Patient presents with   AICD Problem    Kevin [name] is a 82 y.o. male.  With past medical history of iron deficiency anemia, chronic systolic heart failure s/p ICD placement in 2015, COPD, diabetes, hypertension, melanoma on palliative nivolumab who presents to the emergency department with AICD shocks.  Level 5 caveat: Patient is a poor historian.  States that he has generally felt fatigued and short of breath since yesterday.  EMS was called to his home where he stated that AICD has been shocking him since yesterday.  EMS witnessed patient go into V-fib x 3 and was shocked internally by AICD.  EMS did not intervene with epinephrine, external shocks or CPR.  The patient did not lose consciousness during these episodes.  Currently the patient is denying having any chest pain or palpitations.  HPI     Home Medications Prior to Admission medications   Medication Sig Start Date End Date Taking? Authorizing Provider  amiodarone (PACERONE) 200 MG tablet Take 0.5 tablets (100 mg total) by mouth daily. 01/07/23 01/02/24 Yes Yates Decamp, MD  apixaban (ELIQUIS) 5 MG TABS tablet Take 1 tablet (5 mg total) by mouth 2 (two) times daily. 01/06/23 01/01/24 Yes Yates Decamp, MD  atorvastatin (LIPITOR) 80 MG tablet Take 1 tablet (80 mg total) by mouth at bedtime. 01/06/23  Yes Yates Decamp, MD  furosemide (LASIX) 40 MG tablet TAKE 1 TABLET EVERY OTHER DAY 04/03/23  Yes Yates Decamp, MD  magnesium oxide (MAG-OX) 400 (240 Mg) MG tablet TAKE 1 TABLET BY MOUTH TWICE A DAY Patient taking differently: Take 400 mg by mouth 2 (two) times daily. 09/11/22  Yes Dellia Beckwith, MD  metFORMIN (GLUCOPHAGE) 500 MG tablet Take 500 mg by mouth 2 (two) times daily. 09/29/22  Yes [provider]  metoprolol succinate (TOPROL-XL) 25 MG 24 hr tablet TAKE 0.5  TABLET (12.5 MG TOTAL) BY MOUTH DAILY. Patient taking differently: Take 12.5 mg by mouth daily. 04/10/23  Yes Yates Decamp, MD  metroNIDAZOLE (FLAGYL) 500 MG tablet Take 1 tablet (500 mg total) by mouth 3 (three) times daily. 06/03/23  Yes Mosher, Harvin Hazel A, PA-C  spironolactone (ALDACTONE) 25 MG tablet TAKE 1 TABLET BY MOUTH EVERY DAY IN THE MORNING Patient taking differently: Take 25 mg by mouth daily. 04/21/23  Yes Yates Decamp, MD  nicotine (NICODERM CQ - DOSED IN MG/24 HOURS) 14 mg/24hr patch PLACE 1 PATCH ONTO THE SKIN DAILY. Patient not taking: Reported on 06/10/2023 05/25/23   Yates Decamp, MD      Allergies    Patient has no known allergies.    Review of Systems   Review of Systems  Constitutional:  Positive for fatigue.  Respiratory:  Positive for shortness of breath.   All other systems reviewed and are negative.   Physical Exam Updated Vital Signs BP 109/79   Pulse 79   Temp 98.1 F (36.7 C) (Oral)   Resp 19   Ht 5\' 7"  (1.702 m)   Wt 59 kg   SpO2 94%   BMI 20.36 kg/m  Physical Exam Vitals and nursing note reviewed.  Constitutional:      General: He is not in acute distress.    Appearance: He is ill-appearing.     Comments: Chronically ill-appearing  HENT:     Head: Normocephalic.  Mouth/Throat:     Mouth: Mucous membranes are dry.     Pharynx: Oropharynx is clear.  Eyes:     General: No scleral icterus.    Extraocular Movements: Extraocular movements intact.  Cardiovascular:     Rate and Rhythm: Normal rate and regular rhythm.     Pulses: Normal pulses.     Heart sounds: No murmur heard. Pulmonary:     Effort: Pulmonary effort is normal.     Breath sounds: Examination of the right-lower field reveals rales. Examination of the left-lower field reveals rales. Rales present.  Chest:     Comments: AICD present Chemotherapy port in the right chest wall Abdominal:     General: Bowel sounds are normal.     Palpations: Abdomen is soft.  Musculoskeletal:      Cervical back: Neck supple.  Skin:    General: Skin is warm and dry.     Capillary Refill: Capillary refill takes less than 2 seconds.  Neurological:     General: No focal deficit present.     Mental Status: He is alert and oriented to person, place, and time.  Psychiatric:        Mood and Affect: Mood normal.        Behavior: Behavior normal.     ED Results / Procedures / Treatments   Labs (all labs ordered are listed, but only abnormal results are displayed) Labs Reviewed  COMPREHENSIVE METABOLIC PANEL - Abnormal; Notable for the following components:      Result Value   Glucose, Bld 108 (*)    BUN 28 (*)    Creatinine, Ser 1.39 (*)    Albumin 3.3 (*)    GFR, Estimated 51 (*)    All other components within normal limits  CBC WITH DIFFERENTIAL/PLATELET - Abnormal; Notable for the following components:   RBC 3.85 (*)    Hemoglobin 11.5 (*)    HCT 37.3 (*)    RDW 15.9 (*)    Lymphs Abs 0.5 (*)    All other components within normal limits  TROPONIN I (HIGH SENSITIVITY) - Abnormal; Notable for the following components:   Troponin I (High Sensitivity) 21 (*)    All other components within normal limits  RESP PANEL BY RT-PCR (RSV, FLU A&B, COVID)  RVPGX2  MAGNESIUM  BRAIN NATRIURETIC PEPTIDE  TROPONIN I (HIGH SENSITIVITY)    EKG None  Radiology DG Chest Port 1 View  Result Date: 06/10/2023 CLINICAL DATA:  chest pain EXAM: PORTABLE CHEST 1 VIEW COMPARISON:  10/17/2021. FINDINGS: There are heterogeneous opacities overlying the bilateral lower lobes and lateral aspect of the right middle lobe. Findings favor multilobar pneumonia. There is associated prominence of interstitial markings, which may represent superimposed pulmonary edema. Correlate clinically. There is blunting of left lateral costophrenic angle, suggesting left pleural effusion. Right lateral costophrenic angle is grossly clear. Mildly enlarged cardio-mediastinal silhouette. No acute osseous abnormalities. The  soft tissues are within normal limits. There is a left sided 2-lead pacemaker. CT Port-A-Cath is seen overlying the right chest wall with the catheter terminating in the midportion of superior vena cava. IMPRESSION: Multilobar pneumonia. Possible superimposed pulmonary edema. Electronically Signed   By: Jules Schick M.D.   On: 06/10/2023 14:04    Procedures Procedures   Medications Ordered in ED Medications  cefTRIAXone (ROCEPHIN) 1 g in sodium chloride 0.9 % 100 mL IVPB (has no administration in time range)  azithromycin (ZITHROMAX) 500 mg in sodium chloride 0.9 % 250 mL IVPB (has no administration  in time range)  metoprolol succinate (TOPROL-XL) 24 hr tablet 50 mg (has no administration in time range)  irbesartan (AVAPRO) tablet 75 mg (has no administration in time range)    ED Course/ Medical Decision Making/ A&P   {    Medical Decision Making Amount and/or Complexity of Data Reviewed Labs: ordered. Radiology: ordered.  Risk Decision regarding hospitalization.  Initial Impression and Ddx 82 year old male who presents to the emergency department with AICD shocks Patient PMH that increases complexity of ED encounter: Iron deficiency anemia, systolic heart failure status post ICD placement in 2015, COPD, diabetes, hypertension, melanoma  Interpretation of Diagnostics I independent reviewed and interpreted the labs as followed: initial troponin 21, cmp with stable Cr. WBC without leukocytosis. Bnp and delta trop pending. RVP pending.  - I independently visualized the following imaging with scope of interpretation limited to determining acute life threatening conditions related to emergency care: CXR, which revealed multilobar pneumonia.   Patient Reassessment and Ultimate Disposition/Management 82 year old male who presents to the emergency department with AICD shocks.  He is stable on presentation.  He is alert, oriented, speaking in complete sentences.  He has stable vital  signs.  Does appear mildly short of breath.  But he was able to get up out of EMS stretcher and ambulate himself to bed.  I have not witnessed any shocks since he has been here so far.  Will obtain ACS workup as well as a BNP, magnesium.  Anticipate speaking with cardiology after initial evaluation.  Dr. Jacinto Halim, cardiology will see the patient. Consulted and spoke with Dr. Ophelia Charter, hospitalist who at this time does not feel comfortable admitting patient.  She states that she will come evaluate the patient at bedside but encourages reaching back out to cardiology for admission.  Chest x-ray does show multilobar pneumonia? No fever or new cough. Although on immunotherapy may not mount wbc or fever?  Adding on RVP.  Starting azithromycin and ceftriaxone for CAP coverage empirically until proven otherwise.   1421: Spoke with Dr. Jacinto Halim again who will admit patient. Will need admission for cardiac work up. Etiology of his recurrent episodes of possible vfib and AICD firing. 1440: Dr. Jacinto Halim has evaluated patient at bedside and received pacemaker/ICD report. Does not show any evidence of afib, vfib or any AICD firing over the past 2 days? He feels comfortable discharging him from cardiac standpoint and also does not feel he needs troponin trended at this time.   I reconsulted with Dr. Ophelia Charter, hospitalist who will come to the bedside and evaluate patient for possible admission. He ambulated okay. Desatted slightly but has chronic respiratory failure with PRN O2 at home. Not requiring O2 at rest. She will give admission opinion.   Handoff given to Langston Masker, PA-C at change of shift. Please see her note for completion of care.  Patient management required discussion with the following services or consulting groups:  Hospitalist Service and Cardiology  Complexity of Problems Addressed Acute illness or injury that poses threat of life of bodily function  Additional Data Reviewed and Analyzed Further history  obtained from: EMS on arrival, Past medical history and medications listed in the EMR, Prior ED visit notes, and Care Everywhere  Patient Encounter Risk Assessment Consideration of hospitalization  Final Clinical Impression(s) / ED Diagnoses Final diagnoses:  AICD discharge    Rx / DC Orders ED Discharge Orders     None         Cristopher Peru, PA-C 06/10/23 1423  Cristopher Peru, PA-C 06/10/23 1516    Coral Spikes, DO 06/10/23 [phone]

## 2023-06-10 NOTE — ED Notes (Signed)
Pt spo2 was at 90 before ambulating, as soon as pt started walking it dropped to 87.

## 2023-06-11 ENCOUNTER — Inpatient Hospital Stay: Payer: Medicare PPO

## 2023-06-11 ENCOUNTER — Encounter: Payer: Self-pay | Admitting: Oncology

## 2023-06-11 ENCOUNTER — Inpatient Hospital Stay: Payer: Medicare PPO | Admitting: Oncology

## 2023-06-11 NOTE — Progress Notes (Incomplete)
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  Kevin [name] had a hypermetabolic right submandibular node on PET, but no evidence of distant metastasis.  His original lesion was diagnosed in March 2016.  Kevin [name] underwent excision, but refused aggressive therapy.  Kevin [name] has been receiving palliative nivolumab since November 2016, with several interruptions. His treatment was on hold from December 2022 to April 2023 due to cardiorespiratory issues. Kevin [name] continues to tolerate nivolumab without difficulty.  CT imaging in August 2023 remains without evidence of metastatic disease.  However, there were increasing bilateral pleural effusions and small volume abdominal pelvic ascites, with stable pericardial effusion, felt to be most likely due to fluid overload.  Kevin [name] is on Entresto. His current CT scans show frank pulmonary edema but no evidence of metastatic or recurrent melanoma. Kevin [name] is following up with his cardiologist now and has a echocardiogram scheduled on 04/14/2023.    B12 deficiency anemia Kevin [name] had missed several B12 injections, but these were resumed and Kevin [name] has continued them monthly.  His hemoglobin had improved with the B12 injections, but then Kevin [name] was found to have be iron deficient.  His B12 level last month was good.   Iron deficiency anemia Kevin [name] has had chronic anemia, but in July was found to have iron deficiency, so was treated with IV iron.  His hemoglobin improved somewhat since receiving the iron but today's results are pending.   Thrombocytopenia (HCC) Kevin [name] is new mild thrombocytopenia of uncertain etiology.  This may be related to his treatment.  This has nearly resolved now.    Plan I will refill the nausea medication. Kevin [name]  continues to smoke and claims that Kevin [name] is trying to stop. Kevin [name] has a echocardiogram scheduled on 04/14/2023 and his day 1 cycle 23 of Nivolumab is scheduled on 04/15/2023. As stable as Kevin [name] is clinically, I think we can wait 6 months to repeat scans. The last ones were done on 12/26/2023.  His labs today are pending. I will see him back in 4 weeks with CBC, CMP, T4, and TSH. The patient understands the plans discussed today and is in agreement with them.  Kevin [name] knows to contact our office if Kevin [name] develops concerns prior to his next appointment.  I provided 16 minutes of face-to-face time during this this encounter and > 50% was spent counseling as documented under my assessment and plan.    [facility] AT [facility] [address] [place] [address] Dept: [phone] Dept Fax: [phone]   No orders of the defined types were placed in this encounter.   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  His original lesion was diagnosed in March 2016.  Kevin [name] underwent excision, but refused aggressive surgery.  The lesion was at least 4 mm thick with a Clark's level 4, and a mitotic index 10/mm.  The margins were positive, but wide reexcision revealed no residual melanoma.  Kevin [name] had recurrence within 2 months and the lesion had been steadily growing.  Kevin [name] initially had refused seeing a medical oncologist, but finally came  to [place] for evaluation and treatment in October 2016.  Kevin [name] does have extensive comorbidities including COPD, diabetes, hypertension, coronary artery disease, history of myocardial infarction, and pacemaker with AICD.  PET revealed hypermetabolism in the right submandibular node, but no evidence of distant metastasis.  Kevin [name] was having significant pain and requiring  regular hydrocodone doses.  Kevin [name] also had severe disfigurement and avoided going out in public because of the facial lesion.  Kevin [name] has been receiving palliative nivolumab since November 2016 and has had a good response with a decrease in the right facial tumor.  Kevin [name] has had associated hypomagnesemia and hypokalemia, so is on oral magnesium and potassium supplements. Kevin [name] has had mild chronic anemia, which has been stable. Kevin [name] was found to have B12 deficiency and continues B12 injections monthly.  CT head/neck in May 2017 revealed some nonspecific skin thickening in the right face in the area of his melanoma in the right submandibular lymph node had decreased in size, now measuring 6 x 12 mm, but still prominent when compared to the left submandibular node.  Kevin [name] had 15 cycles of nivolumab and relocated to continue to get the same treatment.  Kevin [name] had his 30th dose in February 2018 and then returned to the [place] area.     Kevin [name] was admitted to the hospital in early March 2018 for congestive heart failure associated with pleural and pericardial effusions.  Kevin [name] had no leukocytosis, fever or purulent sputum.  The echocardiogram revealed severe diffuse hypokinesis with mild concentric hypertrophy and markedly depressed ejection fraction of 20 to 25%, associated with moderate mitral regurgitation and moderate left atrial dilatation.  Kevin [name] improved with diuresis.  CT chest in March 2018 revealed stable cardiomegaly and stable small pericardial effusion, but no evidence of immune mediated pneumonitis or metastatic disease.  The previously seen bilateral pleural effusions had resolved.  Kevin [name] returned to our care and nivolumab every 2 weeks was resumed in April.  CT chest was repeated in early May after 1 month back on therapy.  This was stable, without evidence of recurrent pleural effusions, immunotherapy toxicities, or evidence of metastatic disease.  Kevin [name] was switched to every 28-day nivolumab in August 2018.  Kevin [name] has had stable disease  so has continued on nivolumab.  CT imaging in October 2018 revealed a stable 5 mm level 1B cervical lymph node, as well as a stable 8 mm exophytic lesion of the right face.  CT chest did not reveal any evidence of metastatic disease. Borderline cardiomegaly and a small amount of pericardial fluid/thickening was seen, but not reaccumulation of the pleural effusions.  CT head did not reveal any evidence of intracranial metastasis.  There were chronic ischemic changes and evidence of old infarcts.   Unfortunately, his wife passed away in 07-07-19and she was his primary caregiver His daughters have been caring for him since then.  CT scans have remained stable, so Kevin [name] has continued nivolumab every 4 weeks.  Kevin [name] was given mirtazapine for depression and weight loss the dose was increased to 15 mg.  Kevin [name] was then lost to follow-up from December 2021 to April 2022, at which time Kevin [name] resumed nivolumab and B12 every 4 weeks.   CT head, neck and chest in June 2022 did not reveal any evidence of progressive disease.  Kevin [name] was admitted at [facility] in November/December 2022 due to flu and pneumonia.  Nivolumab was placed on hold in December.  CT chest, abdomen and pelvis in February did not reveal any evidence of  metastatic disease.  There was new extensive tree in bud nodularity in the right lung with most significant involvement of the right lower lobe. Findings were most consistent with an infectious or inflammatory process in a pattern suggestive of atypical infection including nontuberculous mycobacterium.  Kevin [name] was seen by Dr. Blenda Nicely, who recommended bronchoscopy, but the patient did not return for follow-up as Kevin [name] did not wish to pursue this procedure.  Nivolumab was resumed again in April.  Kevin [name] was last CT scans in August did not reveal obvious metastatic disease.  There was an increase in his bilateral pleural effusions, stable pericardial effusion and mild ascites, felt to most likely be due to fluid overload.  Kevin [name] is on  Entresto and follows with cardiology.   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 (HCC)  01/24/2015 Cancer Staging   Staging form: Melanoma of the Skin, AJCC 8th Edition - Clinical stage from 01/24/2015: Stage IIB (cT3b, cN0, cM0) - Signed by Dellia Beckwith, MD on 08/19/2021 Histopathologic type: Malignant melanoma, NOS (except juvenile melanoma [id number]) Stage prefix: Initial diagnosis Laterality: Right Lymph-vascular invasion (LVI): LVI not present (absent)/not identified Diagnostic confirmation: Positive histology Specimen type: Excision Staged by: Managing physician Mitotic count: 10 Mitotic unit: mm2 Clark's level: Level IV Tumor-infiltrating lymphocytes: Unknown Neurotropism: Absent Presence of extranodal extension: Absent Breslow depth (mm): 40 Ulceration of the epidermis: Yes Microsatellites: No Primary tumor regression: Unknown Sentinel lymph node biopsy performed: No Matted nodes: No Prognostic indicators: Wide excision negative Stage used in treatment planning: Yes National guidelines used in treatment planning: Yes Type of national guideline used in treatment planning: NCCN   07/18/2020 - 06/18/2022 Chemotherapy   Patient is on Treatment Plan : MELANOMA Nivolumab + B12 q28d     07/18/2020 -  Chemotherapy   Patient is on Treatment Plan : MELANOMA Nivolumab (480) q28d     07/29/2020 Initial Diagnosis   Malignant melanoma of other parts of face (HCC)       INTERVAL HISTORY:  Zarek is here today for repeat clinical assessment for reecurrent melanoma. Patient states that Kevin [name] feels *** and ***.     Kevin [name] denies signs of infection such as sore throat, sinus drainage, cough, or urinary symptoms.  Kevin [name] denies fevers or recurrent chills. Kevin [name] denies pain. Kevin [name] denies nausea, vomiting, chest pain, dyspnea or cough. His appetite is *** and his weight {Weight change:10426}.  Patient states that Kevin [name] is well and only complains of nausea when Kevin [name] smells certain  things. I will refill the nausea medication. Kevin [name] continues to smoke and claims that Kevin [name] is trying to stop. Kevin [name] has a echocardiogram scheduled on 04/14/2023 and his day 1 cycle 23 of Nivolumab is scheduled on 04/15/2023. His labs today are pending. I will see him back in 4 weeks with CBC, CMP, T4, and TSH. Kevin [name] denies signs of infection such as sore throat, sinus drainage, cough, or urinary symptoms.  Kevin [name] denies fevers or recurrent chills. Kevin [name] denies pain. Kevin [name] denies nausea, vomiting, chest pain, dyspnea or cough. His appetite is good and his  weight has increased 2 pounds over last month .  REVIEW OF SYSTEMS:  Review of Systems  Constitutional: Negative.  Negative for appetite change, chills, diaphoresis, fatigue, fever and unexpected weight change.  HENT:  Negative.  Negative for hearing loss, lump/mass, mouth sores, nosebleeds, sore throat, tinnitus, trouble swallowing and voice change.   Eyes: Negative.  Negative for eye problems and icterus.  Respiratory:  Positive for shortness of breath (with exertion  and orthopnea.). Negative for chest tightness, cough, hemoptysis and wheezing.   Cardiovascular:  Positive for leg swelling. Negative for chest pain and palpitations.  Gastrointestinal:  Positive for nausea (triggered by smell). Negative for abdominal distention, abdominal pain, blood in stool, constipation, diarrhea, rectal pain and vomiting.  Endocrine: Negative.   Genitourinary: Negative.  Negative for bladder incontinence, difficulty urinating, dyspareunia, dysuria, frequency, hematuria, nocturia, pelvic pain and penile discharge.   Musculoskeletal: Negative.  Negative for arthralgias, back pain, flank pain, gait problem, myalgias, neck pain and neck stiffness.  Skin: Negative.  Negative for itching, rash and wound.  Neurological: Negative.  Negative for dizziness, extremity weakness, gait problem, headaches, light-headedness, numbness, seizures and speech difficulty.  Hematological:  Negative for  adenopathy. Bruises/bleeds easily.  Psychiatric/Behavioral:  Positive for confusion. Negative for decreased concentration, depression, sleep disturbance and suicidal ideas. The patient is not nervous/anxious.      VITALS:  There were no vitals taken for this visit.  Wt Readings from Last 3 Encounters:  06/10/23 130 lb (59 kg)  06/02/23 149 lb (67.6 kg)  04/21/23 170 lb 12.8 oz (77.5 kg)    There is no height or weight on file to calculate BMI.  Performance status (ECOG): 1 - Symptomatic but completely ambulatory  PHYSICAL EXAM:  Physical Exam Vitals and nursing note reviewed.  Constitutional:      General: Kevin [name] is not in acute distress.    Appearance: Normal appearance. Kevin [name] is normal weight. Kevin [name] is not ill-appearing, toxic-appearing or diaphoretic.  HENT:     Head: Normocephalic and atraumatic.     Right Ear: Tympanic membrane, ear canal and external ear normal. There is no impacted cerumen.     Left Ear: Tympanic membrane, ear canal and external ear normal. There is no impacted cerumen.     Nose: Nose normal. No congestion or rhinorrhea.     Comments: Kevin [name] has a slight firm area of skin just to the right of his nose which is tender but shows no sign of recurrence.     Mouth/Throat:     Mouth: Mucous membranes are moist.     Pharynx: Oropharynx is clear. No oropharyngeal exudate or posterior oropharyngeal erythema.  Eyes:     General: No scleral icterus.       Right eye: No discharge.        Left eye: No discharge.     Extraocular Movements: Extraocular movements intact.     Conjunctiva/sclera: Conjunctivae normal.     Pupils: Pupils are equal, round, and reactive to light.  Neck:     Vascular: No carotid bruit.  Cardiovascular:     Rate and Rhythm: Normal rate and regular rhythm.     Pulses: Normal pulses.     Heart sounds: Normal heart sounds. No murmur heard.    No friction rub. No gallop.  Pulmonary:     Effort: Pulmonary effort is normal. No respiratory distress.      Breath sounds: No stridor. Examination of the right-upper field reveals wheezing. Examination of the right-middle field reveals wheezing. Examination of the right-lower field reveals wheezing and rales. Examination of the left-lower field reveals rales. Wheezing and rales present. No decreased breath sounds or rhonchi.     Comments: Expiratory wheezes of the right lung Chest:     Chest wall: No tenderness.  Abdominal:     General: Bowel sounds are normal. There is no distension.     Palpations: Abdomen is soft. There is no fluid wave, hepatomegaly, splenomegaly or  mass.     Tenderness: There is no abdominal tenderness. There is no right CVA tenderness, left CVA tenderness, guarding or rebound.     Hernia: No hernia is present.  Musculoskeletal:        General: No swelling, tenderness, deformity or signs of injury. Normal range of motion.     Cervical back: Normal range of motion and neck supple. No rigidity or tenderness.     Right lower leg: 1+ Edema present.     Left lower leg: 1+ Edema present.  Lymphadenopathy:     Cervical: No cervical adenopathy.  Skin:    General: Skin is warm and dry.     Coloration: Skin is not jaundiced or pale.     Findings: No bruising, erythema, lesion or rash.  Neurological:     General: No focal deficit present.     Mental Status: Kevin [name] is alert and oriented to person, place, and time. Mental status is at baseline.     Cranial Nerves: No cranial nerve deficit.     Sensory: No sensory deficit.     Motor: No weakness.     Coordination: Coordination normal.     Gait: Gait normal.     Deep Tendon Reflexes: Reflexes normal.  Psychiatric:        Mood and Affect: Mood normal.        Behavior: Behavior normal.        Thought Content: Thought content normal.        Judgment: Judgment normal.     LABS:      Latest Ref Rng & Units 06/10/2023   12:33 PM 06/02/2023    2:32 PM 04/10/2023    2:36 PM  CBC  WBC 4.0 - 10.5 K/uL 5.5  4.4  4.2   Hemoglobin 13.0 -  17.0 g/dL 96.0  45.4  09.8   Hematocrit 39.0 - 52.0 % 37.3  36.0  33.9   Platelets 150 - 400 K/uL 207  188  134       Latest Ref Rng & Units 06/10/2023   12:33 PM 06/02/2023    2:32 PM 04/10/2023    2:36 PM  CMP  Glucose 70 - 99 mg/dL 119  147  829   BUN 8 - 23 mg/dL 28  38  30   Creatinine 0.61 - 1.24 mg/dL 5.62  1.30  8.65   Sodium 135 - 145 mmol/L 140  141  142   Potassium 3.5 - 5.1 mmol/L 4.4  4.0  4.2   Chloride 98 - 111 mmol/L 108  105  105   CO2 22 - 32 mmol/L 22  25  27    Calcium 8.9 - 10.3 mg/dL 9.0  8.8  9.3   Total Protein 6.5 - 8.1 g/dL 6.6  7.1  7.3   Total Bilirubin 0.3 - 1.2 mg/dL 0.8  0.9  0.6   Alkaline Phos 38 - 126 U/L 116  138  101   AST 15 - 41 U/L 21  18  14    ALT 0 - 44 U/L 15  15  14     Component Ref Range & Units 1 d ago (06/10/23) 1 d ago (06/10/23) 1 yr ago (10/06/21)  Troponin I (High Sensitivity) <18 ng/L 21 High  21 High  CM 55 High  CM     Component Ref Range & Units 1 mo ago (03/09/23) 1 mo ago (02/23/23) 2 mo ago (01/13/23) 4 mo ago (12/01/22) 6 mo ago (09/23/22) 7 mo ago (  08/27/22) 9 mo ago (06/16/22)  TSH 0.350 - 4.500 uIU/mL 4.232 3.618 CM 5.918 High  CM 4.720 High  CM 4.432 CM 4.084 CM 3.881 CM          Component Ref Range & Units 1 d ago (06/10/23) 6 mo ago (12/01/22) 12 mo ago (06/16/22) 1 yr ago (10/09/21) 1 yr ago (10/08/21)  Magnesium 1.7 - 2.4 mg/dL 1.8 1.7 CM 1.61 R 2.6 High  CM 2.3 CM              Component Ref Range & Units 1 mo ago (03/09/23) 1 mo ago (02/23/23) 2 mo ago (01/13/23) 4 mo ago (12/01/22) 6 mo ago (09/23/22) 7 mo ago (08/27/22) 10 mo ago (05/20/22)  T4, Total 4.5 - 12.0 ug/dL 09.6 9.9 CM 8.5 CM 9.0 CM 8.8 CM 9.3 CM 9.1 CM   Component Ref Range & Units 02/26/23 10 mo ago  BNP 0.0 - 100.0 pg/mL 1,423.8 High  983.0 High  CM       No results found for: "CEA1", "CEA" / No results found for: "CEA1", "CEA" No results found for: "PSA1" No results found for: "EAV409" No results found for: "CAN125"  No  results found for: "TOTALPROTELP", "ALBUMINELP", "A1GS", "A2GS", "BETS", "BETA2SER", "GAMS", "MSPIKE", "SPEI" Lab Results  Component Value Date   TIBC 295 02/26/2023   TIBC 337 02/23/2023   TIBC 321 09/24/2022   FERRITIN 118 02/26/2023   FERRITIN 64 02/23/2023   FERRITIN 108 09/24/2022   IRONPCTSAT 15 02/26/2023   IRONPCTSAT 11 (L) 02/23/2023   IRONPCTSAT 16 (L) 09/24/2022   Lab Results  Component Value Date   LDH 120 03/09/2023   LDH 132 02/23/2023   LDH 108 09/24/2022    STUDIES:        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)    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)    Thrombocytopenia (HCC) 07/29/2022    Past Surgical History:  Procedure Laterality Date   APPENDECTOMY     CARDIAC CATHETERIZATION     CORONARY ANGIOPLASTY     PACEMAKER INSERTION     RIGHT/LEFT HEART CATH AND CORONARY ANGIOGRAPHY N/A 10/09/2021   Procedure: RIGHT/LEFT HEART CATH AND CORONARY ANGIOGRAPHY;  Surgeon: Elder Negus, MD;  Location: [facility] INVASIVE CV LAB;  Service: Cardiovascular;  Laterality: N/A;    Family History  Problem Relation Age of Onset   Breast cancer Mother    Diabetes Mellitus II Father    Ovarian cancer Sister    Cancer Brother    Cancer Brother    Cancer Maternal Uncle    Breast cancer Niece     Social History:  reports that Kevin [name] has been smoking cigarettes. Kevin [name] has a 30  pack-year smoking history. Kevin [name] has never used smokeless tobacco. Kevin [name] reports that Kevin [name] does not currently use alcohol. Kevin [name] reports that Kevin [name] does not currently use drugs.The patient is alone today.  Allergies: No  Known Allergies  Current Medications: Current Outpatient Medications  Medication Sig Dispense Refill   amiodarone (PACERONE) 200 MG tablet Take 0.5 tablets (100 mg total) by mouth daily. 30 tablet 5   amoxicillin-clavulanate (AUGMENTIN) 875-125 MG tablet Take 1 tablet by mouth 2 (two) times daily. 20 tablet 0   apixaban (ELIQUIS) 5 MG TABS tablet Take 1 tablet (5 mg total) by mouth 2 (two) times daily. 180 tablet 3   atorvastatin (LIPITOR) 80 MG tablet Take 1 tablet (80 mg total) by mouth at bedtime. 90 tablet 3   furosemide (LASIX) 40 MG tablet TAKE 1 TABLET EVERY OTHER DAY 45 tablet 3   magnesium oxide (MAG-OX) 400 (240 Mg) MG tablet TAKE 1 TABLET BY MOUTH TWICE A DAY (Patient taking differently: Take 400 mg by mouth 2 (two) times daily.) 180 tablet 1   metFORMIN (GLUCOPHAGE) 500 MG tablet Take 500 mg by mouth 2 (two) times daily.     metoprolol succinate (TOPROL-XL) 25 MG 24 hr tablet TAKE 0.5 TABLET (12.5 MG TOTAL) BY MOUTH DAILY. (Patient taking differently: Take 12.5 mg by mouth daily.) 45 tablet 3   metroNIDAZOLE (FLAGYL) 500 MG tablet Take 1 tablet (500 mg total) by mouth 3 (three) times daily. 30 tablet 0   nicotine (NICODERM CQ - DOSED IN MG/24 HOURS) 14 mg/24hr patch PLACE 1 PATCH ONTO THE SKIN DAILY. (Patient not taking: Reported on 06/10/2023) 28 patch 0   spironolactone (ALDACTONE) 25 MG tablet TAKE 1 TABLET BY MOUTH EVERY DAY IN THE MORNING (Patient taking differently: Take 25 mg by mouth daily.) 90 tablet 1   No current facility-administered medications for this visit.     I,Jasmine M Lassiter,acting as a scribe for Dellia Beckwith, MD.,have documented all relevant documentation on the behalf of Dellia Beckwith, MD,as directed by  Dellia Beckwith, MD while in the  presence of Dellia Beckwith, MD.

## 2023-06-12 ENCOUNTER — Ambulatory Visit: Payer: Medicare PPO

## 2023-06-15 ENCOUNTER — Telehealth: Payer: Self-pay

## 2023-06-15 ENCOUNTER — Ambulatory Visit: Payer: Medicare PPO

## 2023-06-15 NOTE — Telephone Encounter (Signed)
Opened in error

## 2023-06-16 ENCOUNTER — Telehealth: Payer: Self-pay

## 2023-06-16 NOTE — Telephone Encounter (Signed)
Transition Care Management Unsuccessful Follow-up Telephone Call  Date of discharge and from where:  [facility] 7/[facility]  Attempts:  1st Attempt  Reason for unsuccessful TCM follow-up call:  No answer/busy   Lenard Forth [facility] Guide, [facility] [phone] [address] [place], [place], [place] [address] Phone: [phone] Email: [email or web address]

## 2023-06-17 ENCOUNTER — Telehealth: Payer: Self-pay

## 2023-06-17 DIAGNOSIS — E1129 Type 2 diabetes mellitus with other diabetic kidney complication: Secondary | ICD-10-CM | POA: Diagnosis not present

## 2023-06-17 DIAGNOSIS — J449 Chronic obstructive pulmonary disease, unspecified: Secondary | ICD-10-CM | POA: Diagnosis not present

## 2023-06-17 DIAGNOSIS — I5022 Chronic systolic (congestive) heart failure: Secondary | ICD-10-CM | POA: Diagnosis not present

## 2023-06-17 DIAGNOSIS — I4891 Unspecified atrial fibrillation: Secondary | ICD-10-CM | POA: Diagnosis not present

## 2023-06-17 DIAGNOSIS — R82998 Other abnormal findings in urine: Secondary | ICD-10-CM | POA: Diagnosis not present

## 2023-06-17 DIAGNOSIS — R809 Proteinuria, unspecified: Secondary | ICD-10-CM | POA: Diagnosis not present

## 2023-06-17 DIAGNOSIS — Z139 Encounter for screening, unspecified: Secondary | ICD-10-CM | POA: Diagnosis not present

## 2023-06-17 DIAGNOSIS — Z9181 History of falling: Secondary | ICD-10-CM | POA: Diagnosis not present

## 2023-06-17 DIAGNOSIS — J9611 Chronic respiratory failure with hypoxia: Secondary | ICD-10-CM | POA: Diagnosis not present

## 2023-06-17 NOTE — Telephone Encounter (Signed)
Transition Care Management Unsuccessful Follow-up Telephone Call  Date of discharge and from where:  [facility]  Attempts:  2nd Attempt  Reason for unsuccessful TCM follow-up call:  No answer/busy   Lenard Forth [facility] Guide, [facility] [phone] [address] [place], [place], [place] [address] Phone: [phone] Email: [email or web address]

## 2023-06-29 ENCOUNTER — Inpatient Hospital Stay: Payer: Medicare PPO | Admitting: Hematology and Oncology

## 2023-06-29 ENCOUNTER — Inpatient Hospital Stay: Payer: Medicare PPO

## 2023-06-29 NOTE — Progress Notes (Deleted)
[facility]  [address] [place],  [place]  [address] [phone]  Clinic Day:  06/02/2023  Referring physician: Lonie Peak, PA-C  ASSESSMENT & PLAN:   Assessment & Plan: No problem-specific Assessment & Plan notes found for this encounter.     The patient understands the plans discussed today and is in agreement with them.  He knows to contact our office if he develops concerns prior to his next appointment.   I provided 30 minutes of face-to-face time during this encounter and > 50% was spent counseling as documented under my assessment and plan.    Adah Perl, PA-C  [facility] [address] [place] [address] Dept: [phone] Dept Fax: [phone]   No orders of the defined types were placed in this encounter.     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  His original lesion was diagnosed in March 2016.  He underwent excision, but refused aggressive surgery.  The lesion was at least 4 mm thick with a Clark's level 4, and a mitotic index 10/mm.  The margins were positive, but wide reexcision revealed no residual melanoma.  He had recurrence within 2 months and the lesion had been steadily growing.  He initially had refused seeing a medical oncologist, but finally came to [place] for evaluation and treatment in October 2016.  He does have extensive comorbidities including COPD, diabetes, hypertension, coronary artery disease, history of myocardial infarction, and pacemaker with AICD.  PET revealed hypermetabolism in the right submandibular node, but no evidence of distant metastasis.  He was having significant pain and requiring regular hydrocodone doses.  He also had severe disfigurement and  avoided going out in public because of the facial lesion.  He has been receiving palliative nivolumab since November 2016 and has had a good response with a decrease in the right facial tumor.  He has had associated hypomagnesemia and hypokalemia, so is on oral magnesium and potassium supplements. He has had mild chronic anemia, which has been stable. He was found to have B12 deficiency and continues B12 injections monthly.  CT head/neck in May 2017 revealed some nonspecific skin thickening in the right face in the area of his melanoma in the right submandibular lymph node had decreased in size, now measuring 6 x 12 mm, but still prominent when compared to the left submandibular node.  He had 15 cycles of nivolumab and relocated to continue to get the same treatment.  He had his 30th dose in February 2018 and then returned to the [place] area.     He was admitted to the hospital in early March 2018 for congestive heart failure associated with pleural and pericardial effusions.  He had no leukocytosis, fever or purulent sputum.  The echocardiogram revealed severe diffuse hypokinesis with mild concentric hypertrophy and markedly depressed ejection fraction of 20 to 25%, associated with moderate mitral regurgitation and moderate left atrial dilatation.  He improved with diuresis.  CT chest in March 2018 revealed stable cardiomegaly and stable small pericardial effusion, but no evidence of immune mediated pneumonitis or metastatic disease.  The previously seen bilateral pleural effusions had resolved.  He returned to our care and nivolumab every 2 weeks was resumed in April.  CT chest was repeated in early May after 1  month back on therapy.  This was stable, without evidence of recurrent pleural effusions, immunotherapy toxicities, or evidence of metastatic disease.  He was switched to every 28-day nivolumab in August 2018.  He has had stable disease so has continued on nivolumab.  CT imaging in October 2018  revealed a stable 5 mm level 1B cervical lymph node, as well as a stable 8 mm exophytic lesion of the right face.  CT chest did not reveal any evidence of metastatic disease. Borderline cardiomegaly and a small amount of pericardial fluid/thickening was seen, but not reaccumulation of the pleural effusions.  CT head did not reveal any evidence of intracranial metastasis.  There were chronic ischemic changes and evidence of old infarcts.   Unfortunately, his wife passed away in Aug 06, 2019and she was his primary caregiver His daughters have been caring for him since then.  CT scans have remained stable, so he has continued nivolumab every 4 weeks.  He was given mirtazapine for depression and weight loss the dose was increased to 15 mg.  He was then lost to follow-up from December 2021 to April 2022, at which time he resumed nivolumab and B12 every 4 weeks.   CT head, neck and chest in June 2022 did not reveal any evidence of progressive disease.  He was admitted at [facility] in November/December 2022 due to flu and pneumonia.  Nivolumab was placed on hold in December.  CT chest, abdomen and pelvis in February 2023 did not reveal any evidence of metastatic disease.  There was new extensive tree in bud nodularity in the right lung with most significant involvement of the right lower lobe. Findings were most consistent with an infectious or inflammatory process in a pattern suggestive of atypical infection including nontuberculous mycobacterium.  He was seen by Dr. Blenda Nicely, who recommended bronchoscopy, but the patient did not return for follow-up as he did not wish to pursue this procedure. Nivolumab was resumed again in April.  CT scans in August 2023 did not reveal obvious metastatic disease.  There was an increase in his bilateral pleural effusions, stable pericardial effusion and mild ascites, felt to most likely be due to fluid overload.  He is on Entresto and follows with cardiology.    He received a 22nd  cycle of nivolumab in March.  His 23rd cycle was delayed due to C. difficile in April.  CT face, chest, abdomen and pelvis in May not reveal any definitive signs of recurrent or metastatic disease.  There was unchanged mild chronic skin thickening in the right infraorbital/pre-maxillary region, at site of the patient's treated melanoma. No CT findings to suggest recurrent tumor at this site. No lymphadenopathy within the imaged neck. Small 6 mm nodule in the RIGHT lower lobe could represent focal inflammation or atelectasis. Findings of suspected heart failure with effusions and pulmonary edema. Pulmonary edema is new compared to previous imaging. RIGHT pleural effusion is slightly diminished. Patchy ground-glass may also reflect a component of edema. Cardiomegaly and signs of coronary artery disease/PTC with stable moderate pericardial effusion. Anasarca with body wall edema and ascites. Mild skin thickening over the anterior abdominal wall and in the gluteal cleft similar to prior imaging.  He saw his cardiologist and underwent echocardiogram on June 4, which revealed left ventricle cavity is severely dilated. Mild concentric hypertrophy of the left ventricle. Severe global hypokinesis. LVEF <20%. No obvious LV thrombus noted on this noncontrast study.  Doppler evidence of grade II (pseudonormal) diastolic dysfunction, elevated LAP. Device lead  seen in RA/RV. Left atrial cavity is severely dilated. Right atrial cavity is severely dilated. Native trileaflet aortic valve. No evidence of aortic stenosis. Mild to moderate aortic regurgitation. Moderate to severe mitral regurgitation. Severe tricuspid regurgitation. Pulmonary hypertension. Estimated pulmonary artery systolic pressure 73 mmHg. Small to moderate circumferential pericardial effusion. Previous study in February 2023 reported mild to moderate tricuspid regurgitation, estimated PASP 55 mmHg. Dr. Gilman Buttner recommended follow-up CT imaging in 6 months.   His  24th cycle of nivolumab was held due to severe diarrhea.  Stool studies were negative.  He was unable to make his follow-up the following week as he was in the [facility] ER on July 31with AICD shocks.  He reported feeling generally fatigued and short of breath.  EMS was called to his home where he stated that AICD has been shocking him since the day prior.  EMS witnessed patient go into V-fib x 3 and was shocked internally by AICD.  EMS did not intervene with epinephrine, external shocks or CPR.  The patient did not lose consciousness during these episodes.  EKG revealed Sinus rhythm Atrial premature complexes Nonspecific IVCD with LAD LVH with secondary repolarization abnormality.  He was found to have pneumonia and given IV antibiotics in the ER.  He was seen by cardiology.  He was discharged home on Augmentin and advised to follow up with primary care.   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 (HCC)  01/24/2015 Cancer Staging   Staging form: Melanoma of the Skin, AJCC 8th Edition - Clinical stage from 01/24/2015: Stage IIB (cT3b, cN0, cM0) - Signed by Dellia Beckwith, MD on 08/19/2021 Histopathologic type: Malignant melanoma, NOS (except juvenile melanoma [id number]) Stage prefix: Initial diagnosis Laterality: Right Lymph-vascular invasion (LVI): LVI not present (absent)/not identified Diagnostic confirmation: Positive histology Specimen type: Excision Staged by: Managing physician Mitotic count: 10 Mitotic unit: mm2 Clark's level: Level IV Tumor-infiltrating lymphocytes: Unknown Neurotropism: Absent Presence of extranodal extension: Absent Breslow depth (mm): 40 Ulceration of the epidermis: Yes Microsatellites: No Primary tumor regression: Unknown Sentinel lymph node biopsy performed: No Matted nodes: No Prognostic indicators: Wide excision negative Stage used in treatment planning: Yes National guidelines used in treatment planning: Yes Type of national  guideline used in treatment planning: NCCN   07/18/2020 - 06/18/2022 Chemotherapy   Patient is on Treatment Plan : MELANOMA Nivolumab + B12 q28d     07/18/2020 -  Chemotherapy   Patient is on Treatment Plan : MELANOMA Nivolumab (480) q28d     07/29/2020 Initial Diagnosis   Malignant melanoma of other parts of face (HCC)       INTERVAL HISTORY:  Albin is here today for repeat clinical assessment.  He received a 23rd cycle of nivolumab on June 5.  He was due for treatment on July 3, but did not keep his follow-up and labs on June 28 prior to infusion, so treatment had to be delayed again.  He states he has been doing fairly well.  He states he had a fall on his porch recently and injured his right lower ribs.  He states he was seen at urgent care and did not have any fractures.  He reports increased diarrhea since his last visit.  He is waking up at night due to diarrhea and goes to the bathroom after eating or drinking.  Previously he reported 2-3 loose stools a day to his baseline.  He denies fevers or chills. He denies pain. His appetite is good. His weight  has decreased 16 pounds over last 7 weeks, since his last visit here .  We gave him some more samples of nutritional supplements today.  REVIEW OF SYSTEMS:  Review of Systems - Oncology   VITALS:  There were no vitals taken for this visit.  Wt Readings from Last 3 Encounters:  06/10/23 130 lb (59 kg)  06/02/23 149 lb (67.6 kg)  04/21/23 170 lb 12.8 oz (77.5 kg)    There is no height or weight on file to calculate BMI.  Performance status (ECOG): 2 - Symptomatic, <50% confined to bed  PHYSICAL EXAM:  Physical Exam  LABS:      Latest Ref Rng & Units 06/10/2023   12:33 PM 06/02/2023    2:32 PM 04/10/2023    2:36 PM  CBC  WBC 4.0 - 10.5 K/uL 5.5  4.4  4.2   Hemoglobin 13.0 - 17.0 g/dL 28.4  13.2  44.0   Hematocrit 39.0 - 52.0 % 37.3  36.0  33.9   Platelets 150 - 400 K/uL 207  188  134       Latest Ref Rng & Units 06/10/2023    12:33 PM 06/02/2023    2:32 PM 04/10/2023    2:36 PM  CMP  Glucose 70 - 99 mg/dL 102  725  366   BUN 8 - 23 mg/dL 28  38  30   Creatinine 0.61 - 1.24 mg/dL 4.40  3.47  4.25   Sodium 135 - 145 mmol/L 140  141  142   Potassium 3.5 - 5.1 mmol/L 4.4  4.0  4.2   Chloride 98 - 111 mmol/L 108  105  105   CO2 22 - 32 mmol/L 22  25  27    Calcium 8.9 - 10.3 mg/dL 9.0  8.8  9.3   Total Protein 6.5 - 8.1 g/dL 6.6  7.1  7.3   Total Bilirubin 0.3 - 1.2 mg/dL 0.8  0.9  0.6   Alkaline Phos 38 - 126 U/L 116  138  101   AST 15 - 41 U/L 21  18  14    ALT 0 - 44 U/L 15  15  14       No results found for: "CEA1", "CEA" / No results found for: "CEA1", "CEA" No results found for: "PSA1" No results found for: "ZDG387" No results found for: "CAN125"  No results found for: "TOTALPROTELP", "ALBUMINELP", "A1GS", "A2GS", "BETS", "BETA2SER", "GAMS", "MSPIKE", "SPEI"  Lab Results  Component Value Date   TIBC 295 02/26/2023   TIBC 337 02/23/2023   TIBC 321 09/24/2022   FERRITIN 118 02/26/2023   FERRITIN 64 02/23/2023   FERRITIN 108 09/24/2022   IRONPCTSAT 15 02/26/2023   IRONPCTSAT 11 (L) 02/23/2023   IRONPCTSAT 16 (L) 09/24/2022   Lab Results  Component Value Date   LDH 120 03/09/2023   LDH 132 02/23/2023   LDH 108 09/24/2022    STUDIES:  PCV ECHOCARDIOGRAM COMPLETE 04/14/2023   Narrative Echocardiogram 04/14/2023: Left ventricle cavity is severely dilated. Mild concentric hypertrophy of the left ventricle. Severe global hypokinesis. LVEF <20%. No obvious LV thrombus noted on this noncontrast study.  Doppler evidence of grade II (pseudonormal) diastolic dysfunction, elevated LAP. Device lead seen in RA/RV. Left atrial cavity is severely dilated. Right atrial cavity is severely dilated. Native trileaflet aortic valve. No evidence of aortic stenosis. Mild to moderate aortic regurgitation. Moderate to severe mitral regurgitation. Severe tricuspid regurgitation. Pulmonary hypertension. Estimated  pulmonary artery systolic pressure 73 mmHg. Small to moderate  circumferential pericardial effusion. Previous study on 01/02/2022 reported mild to moderate tricuspid regurgitation, estimated PASP 55 mmHg.   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)    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)    Thrombocytopenia (HCC) 07/29/2022    Past Surgical History:  Procedure Laterality Date   APPENDECTOMY     CARDIAC CATHETERIZATION     CORONARY ANGIOPLASTY     PACEMAKER INSERTION     RIGHT/LEFT HEART CATH AND CORONARY ANGIOGRAPHY N/A 10/09/2021   Procedure: RIGHT/LEFT HEART CATH AND CORONARY ANGIOGRAPHY;  Surgeon: Elder Negus, MD;  Location: [facility] INVASIVE CV LAB;  Service: Cardiovascular;  Laterality: N/A;    Family History  Problem Relation Age of Onset   Breast cancer Mother    Diabetes Mellitus II Father    Ovarian cancer Sister    Cancer Brother    Cancer Brother    Cancer Maternal Uncle    Breast cancer Niece     Social History:  reports that he has been smoking cigarettes. He has a 30 pack-year smoking history. He has never used smokeless tobacco. He reports that he does not currently use alcohol. He reports that he does not currently use drugs.The patient is alone today.  Allergies: No Known Allergies  Current Medications: Current Outpatient Medications  Medication Sig Dispense Refill    amiodarone (PACERONE) 200 MG tablet Take 0.5 tablets (100 mg total) by mouth daily. 30 tablet 5   amoxicillin-clavulanate (AUGMENTIN) 875-125 MG tablet Take 1 tablet by mouth 2 (two) times daily. 20 tablet 0   apixaban (ELIQUIS) 5 MG TABS tablet Take 1 tablet (5 mg total) by mouth 2 (two) times daily. 180 tablet 3   atorvastatin (LIPITOR) 80 MG tablet Take 1 tablet (80 mg total) by mouth at bedtime. 90 tablet 3   furosemide (LASIX) 40 MG tablet TAKE 1 TABLET EVERY OTHER DAY 45 tablet 3   magnesium oxide (MAG-OX) 400 (240 Mg) MG tablet TAKE 1 TABLET BY MOUTH TWICE A DAY (Patient taking differently: Take 400 mg by mouth 2 (two) times daily.) 180 tablet 1   metFORMIN (GLUCOPHAGE) 500 MG tablet Take 500 mg by mouth 2 (two) times daily.     metoprolol succinate (TOPROL-XL) 25 MG 24 hr tablet TAKE 0.5 TABLET (12.5 MG TOTAL) BY MOUTH DAILY. (Patient taking differently: Take 12.5 mg by mouth daily.) 45 tablet 3   metroNIDAZOLE (FLAGYL) 500 MG tablet Take 1 tablet (500 mg total) by mouth 3 (three) times daily. 30 tablet 0   nicotine (NICODERM CQ - DOSED IN MG/24 HOURS) 14 mg/24hr patch PLACE 1 PATCH ONTO THE SKIN DAILY. (Patient not taking: Reported on 06/10/2023) 28 patch 0   spironolactone (ALDACTONE) 25 MG tablet TAKE 1 TABLET BY MOUTH EVERY DAY IN THE MORNING (Patient taking differently: Take 25 mg by mouth daily.) 90 tablet 1   No current facility-administered medications for this visit.

## 2023-06-29 NOTE — Assessment & Plan Note (Deleted)
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  He had a hypermetabolic right submandibular node on PET, but no evidence of distant metastasis.  His original lesion was diagnosed in March 2016.  He underwent excision, but refused aggressive therapy.  He has been receiving palliative nivolumab since November 2016, with several interruptions. His treatment was on hold from December 2022 to April 2023 due to cardiorespiratory issues. CT imaging in August 2023 remains without evidence of metastatic disease.  However, there were increasing bilateral pleural effusions and small volume abdominal pelvic ascites, with stable pericardial effusion, felt to be most likely due to fluid overload.  His heart failure is managed by cardiology.  CT scans in February show frank pulmonary edema, but no evidence of metastatic or recurrent melanoma.  He continues to follow with cardiology.  Dr. Gilman Buttner recommended follow-up CT imaging in 6 months.  He received a 22nd cycle of nivolumab in March.  His 23rd cycle was delayed due to C. difficile in April.  He received 23rd cycle on June 5.  He was due for treatment on July 3, but did not keep his follow-up and labs on June 28 prior to infusion.   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 His 24th cycle of nivolumab was held.  Stool studies were negative.  He was unable to keep his appointment the following week due to being in the [facility] ER.

## 2023-06-30 ENCOUNTER — Telehealth: Payer: Self-pay | Admitting: Hematology and Oncology

## 2023-06-30 ENCOUNTER — Other Ambulatory Visit: Payer: Medicare PPO

## 2023-06-30 ENCOUNTER — Ambulatory Visit: Payer: Medicare PPO | Admitting: Hematology and Oncology

## 2023-06-30 NOTE — Telephone Encounter (Signed)
Contacted pt's daughter to reschedule appt since she is unable to bring him. Unable to reach via phone, voicemail was left for [place].

## 2023-07-02 DIAGNOSIS — J9611 Chronic respiratory failure with hypoxia: Secondary | ICD-10-CM | POA: Diagnosis not present

## 2023-07-02 DIAGNOSIS — I5040 Unspecified combined systolic (congestive) and diastolic (congestive) heart failure: Secondary | ICD-10-CM | POA: Diagnosis not present

## 2023-07-02 DIAGNOSIS — J449 Chronic obstructive pulmonary disease, unspecified: Secondary | ICD-10-CM | POA: Diagnosis not present

## 2023-07-07 ENCOUNTER — Telehealth: Payer: Self-pay

## 2023-07-07 NOTE — Telephone Encounter (Signed)
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 Pharmacist would like to see if patient should be put on one. Pharmacist left call back number [phone]

## 2023-07-08 NOTE — Telephone Encounter (Signed)
Discussed with [employer], patient recent hospitalization with sepsis, low blood pressure and acute renal failure, patient presently on spironolactone on hold.  Will consider initiating ACE inhibitor or ARB at a later date if creatinine remains stable

## 2023-07-22 ENCOUNTER — Ambulatory Visit: Payer: Medicare PPO | Admitting: Cardiology

## 2023-07-31 ENCOUNTER — Other Ambulatory Visit: Payer: Self-pay | Admitting: Cardiology

## 2023-07-31 DIAGNOSIS — I5022 Chronic systolic (congestive) heart failure: Secondary | ICD-10-CM

## 2023-08-02 DIAGNOSIS — J9611 Chronic respiratory failure with hypoxia: Secondary | ICD-10-CM | POA: Diagnosis not present

## 2023-08-02 DIAGNOSIS — J449 Chronic obstructive pulmonary disease, unspecified: Secondary | ICD-10-CM | POA: Diagnosis not present

## 2023-08-02 DIAGNOSIS — I5040 Unspecified combined systolic (congestive) and diastolic (congestive) heart failure: Secondary | ICD-10-CM | POA: Diagnosis not present

## 2023-08-05 ENCOUNTER — Telehealth: Payer: Self-pay | Admitting: Hematology and Oncology

## 2023-08-05 NOTE — Telephone Encounter (Signed)
Contacted pt to schedule an appt. Unable to reach via phone, voicemail was left.

## 2023-08-11 NOTE — Progress Notes (Signed)
[facility]  [address] [place],  [place]  [address] [phone]  Clinic Day:  06/02/2023  Referring physician: Lonie Peak, PA-C  ASSESSMENT & PLAN:   Assessment & Plan: No problem-specific Assessment & Plan notes found for this encounter.     The patient understands the plans discussed today and is in agreement with them.  He knows to contact our office if he develops concerns prior to his next appointment.   I provided 30 minutes of face-to-face time during this encounter and > 50% was spent counseling as documented under my assessment and plan.    Adah Perl, PA-C  [facility] AT [facility] [address] [place] [address] Dept: [phone] Dept Fax: [phone]   No orders of the defined types were placed in this encounter.     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  His original lesion was diagnosed in March 2016.  He underwent excision, but refused aggressive surgery.  The lesion was at least 4 mm thick with a Clark's level 4, and a mitotic index 10/mm.  The margins were positive, but wide reexcision revealed no residual melanoma.  He had recurrence within 2 months and the lesion had been steadily growing.  He initially had refused seeing a medical oncologist, but finally came to [place] for evaluation and treatment in October 2016.  He does have extensive comorbidities including COPD, diabetes, hypertension, coronary artery disease, history of myocardial infarction, and pacemaker with AICD.  PET revealed hypermetabolism in the right submandibular node, but no evidence of distant metastasis.  He was having significant pain and requiring regular hydrocodone doses.  He also had severe disfigurement and  avoided going out in public because of the facial lesion.  He has been receiving palliative nivolumab since November 2016 and has had a good response with a decrease in the right facial tumor.  He has had associated hypomagnesemia and hypokalemia, so is on oral magnesium and potassium supplements. He has had mild chronic anemia, which has been stable. He was found to have B12 deficiency and continues B12 injections monthly.  CT head/neck in May 2017 revealed some nonspecific skin thickening in the right face in the area of his melanoma in the right submandibular lymph node had decreased in size, now measuring 6 x 12 mm, but still prominent when compared to the left submandibular node.  He had 15 cycles of nivolumab and relocated to continue to get the same treatment.  He had his 30th dose in February 2018 and then returned to the [place] area.     He was admitted to the hospital in early March 2018 for congestive heart failure associated with pleural and pericardial effusions.  He had no leukocytosis, fever or purulent sputum.  The echocardiogram revealed severe diffuse hypokinesis with mild concentric hypertrophy and markedly depressed ejection fraction of 20 to 25%, associated with moderate mitral regurgitation and moderate left atrial dilatation.  He improved with diuresis.  CT chest in March 2018 revealed stable cardiomegaly and stable small pericardial effusion, but no evidence of immune mediated pneumonitis or metastatic disease.  The previously seen bilateral pleural effusions had resolved.  He returned to our care and nivolumab every 2 weeks was resumed in April.  CT chest was repeated in early May after 1  month back on therapy.  This was stable, without evidence of recurrent pleural effusions, immunotherapy toxicities, or evidence of metastatic disease.  He was switched to every 28-day nivolumab in August 2018.  He has had stable disease so has continued on nivolumab.  CT imaging in October 2018  revealed a stable 5 mm level 1B cervical lymph node, as well as a stable 8 mm exophytic lesion of the right face.  CT chest did not reveal any evidence of metastatic disease. Borderline cardiomegaly and a small amount of pericardial fluid/thickening was seen, but not reaccumulation of the pleural effusions.  CT head did not reveal any evidence of intracranial metastasis.  There were chronic ischemic changes and evidence of old infarcts.   Unfortunately, his wife passed away in 08-Aug-2019and she was his primary caregiver His daughters have been caring for him since then.  CT scans have remained stable, so he has continued nivolumab every 4 weeks.  He was given mirtazapine for depression and weight loss the dose was increased to 15 mg.  He was then lost to follow-up from December 2021 to April 2022, at which time he resumed nivolumab and B12 every 4 weeks.   CT head, neck and chest in June 2022 did not reveal any evidence of progressive disease.  He was admitted at [facility] in November/December 2022 due to flu and pneumonia.  Nivolumab was placed on hold in December.  CT chest, abdomen and pelvis in February 2023 did not reveal any evidence of metastatic disease.  There was new extensive tree in bud nodularity in the right lung with most significant involvement of the right lower lobe. Findings were most consistent with an infectious or inflammatory process in a pattern suggestive of atypical infection including nontuberculous mycobacterium.  He was seen by Dr. Blenda Nicely, who recommended bronchoscopy, but the patient did not return for follow-up as he did not wish to pursue this procedure. Nivolumab was resumed again in April.  CT scans in August 2023 did not reveal obvious metastatic disease.  There was an increase in his bilateral pleural effusions, stable pericardial effusion and mild ascites, felt to most likely be due to fluid overload.  He is on Entresto and follows with cardiology.    He received a 81nd  cycle of nivolumab in March.  His 23rd cycle was delayed due to C. difficile in April.  CT face, chest, abdomen and pelvis in May not reveal any definitive signs of recurrent or metastatic disease.  There was unchanged mild chronic skin thickening in the right infraorbital/pre-maxillary region, at site of the patient's treated melanoma. No CT findings to suggest recurrent tumor at this site. No lymphadenopathy within the imaged neck. Small 6 mm nodule in the RIGHT lower lobe could represent focal inflammation or atelectasis. Findings of suspected heart failure with effusions and pulmonary edema. Pulmonary edema is new compared to previous imaging. RIGHT pleural effusion is slightly diminished. Patchy ground-glass may also reflect a component of edema. Cardiomegaly and signs of coronary artery disease/PTC with stable moderate pericardial effusion. Anasarca with body wall edema and ascites. Mild skin thickening over the anterior abdominal wall and in the gluteal cleft similar to prior imaging.  He saw his cardiologist and underwent echocardiogram on June 4, which revealed left ventricle cavity is severely dilated. Mild concentric hypertrophy of the left ventricle. Severe global hypokinesis. LVEF <20%. No obvious LV thrombus noted on this noncontrast study.  Doppler evidence of grade II (pseudonormal) diastolic dysfunction, elevated LAP. Device lead  seen in RA/RV. Left atrial cavity is severely dilated. Right atrial cavity is severely dilated. Native trileaflet aortic valve. No evidence of aortic stenosis. Mild to moderate aortic regurgitation. Moderate to severe mitral regurgitation. Severe tricuspid regurgitation. Pulmonary hypertension. Estimated pulmonary artery systolic pressure 73 mmHg. Small to moderate circumferential pericardial effusion. Previous study in February 2023 reported mild to moderate tricuspid regurgitation, estimated PASP 55 mmHg. Dr. Gilman Buttner recommended follow-up CT imaging in 6 months.     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 (HCC)  01/24/2015 Cancer Staging   Staging form: Melanoma of the Skin, AJCC 8th Edition - Clinical stage from 01/24/2015: Stage IIB (cT3b, cN0, cM0) - Signed by Dellia Beckwith, MD on 08/19/2021 Histopathologic type: Malignant melanoma, NOS (except juvenile melanoma [id number]) Stage prefix: Initial diagnosis Laterality: Right Lymph-vascular invasion (LVI): LVI not present (absent)/not identified Diagnostic confirmation: Positive histology Specimen type: Excision Staged by: Managing physician Mitotic count: 10 Mitotic unit: mm2 Clark's level: Level IV Tumor-infiltrating lymphocytes: Unknown Neurotropism: Absent Presence of extranodal extension: Absent Breslow depth (mm): 40 Ulceration of the epidermis: Yes Microsatellites: No Primary tumor regression: Unknown Sentinel lymph node biopsy performed: No Matted nodes: No Prognostic indicators: Wide excision negative Stage used in treatment planning: Yes National guidelines used in treatment planning: Yes Type of national guideline used in treatment planning: NCCN   07/18/2020 - 06/18/2022 Chemotherapy   Patient is on Treatment Plan : MELANOMA Nivolumab + B12 q28d     07/18/2020 -  Chemotherapy   Patient is on Treatment Plan : MELANOMA Nivolumab (480) q28d     07/29/2020 Initial Diagnosis   Malignant melanoma of other parts of face (HCC)       INTERVAL HISTORY:  Bacilio is here today for repeat clinical assessment.  He received a 23rd cycle of nivolumab on June 5.  He was due for treatment on July 3, but did not keep his follow-up and labs on June 28 prior to infusion, so treatment had to be delayed again.  He states he has been doing fairly well.  He states he had a fall on his porch recently and injured his right lower ribs.  He states he was seen at urgent care and did not have any fractures.  He reports increased diarrhea since his last visit.  He is waking up at night  due to diarrhea and goes to the bathroom after eating or drinking.  Previously he reported 2-3 loose stools a day to his baseline.  He denies fevers or chills. He denies pain. His appetite is good. His weight has decreased 16 pounds over last 7 weeks, since his last visit here .  We gave him some more samples of nutritional supplements today.  REVIEW OF SYSTEMS:  Review of Systems - Oncology   VITALS:  There were no vitals taken for this visit.  Wt Readings from Last 3 Encounters:  06/10/23 130 lb (59 kg)  06/02/23 149 lb (67.6 kg)  04/21/23 170 lb 12.8 oz (77.5 kg)    There is no height or weight on file to calculate BMI.  Performance status (ECOG): 2 - Symptomatic, <50% confined to bed  PHYSICAL EXAM:  Physical Exam  LABS:      Latest Ref Rng & Units 06/10/2023   12:33 PM 06/02/2023    2:32 PM 04/10/2023    2:36 PM  CBC  WBC 4.0 - 10.5 K/uL 5.5  4.4  4.2   Hemoglobin 13.0 - 17.0 g/dL 46.9  62.9  52.8  Hematocrit 39.0 - 52.0 % 37.3  36.0  33.9   Platelets 150 - 400 K/uL 207  188  134       Latest Ref Rng & Units 06/10/2023   12:33 PM 06/02/2023    2:32 PM 04/10/2023    2:36 PM  CMP  Glucose 70 - 99 mg/dL 161  096  045   BUN 8 - 23 mg/dL 28  38  30   Creatinine 0.61 - 1.24 mg/dL 4.09  8.11  9.14   Sodium 135 - 145 mmol/L 140  141  142   Potassium 3.5 - 5.1 mmol/L 4.4  4.0  4.2   Chloride 98 - 111 mmol/L 108  105  105   CO2 22 - 32 mmol/L 22  25  27    Calcium 8.9 - 10.3 mg/dL 9.0  8.8  9.3   Total Protein 6.5 - 8.1 g/dL 6.6  7.1  7.3   Total Bilirubin 0.3 - 1.2 mg/dL 0.8  0.9  0.6   Alkaline Phos 38 - 126 U/L 116  138  101   AST 15 - 41 U/L 21  18  14    ALT 0 - 44 U/L 15  15  14       No results found for: "CEA1", "CEA" / No results found for: "CEA1", "CEA" No results found for: "PSA1" No results found for: "NWG956" No results found for: "CAN125"  No results found for: "TOTALPROTELP", "ALBUMINELP", "A1GS", "A2GS", "BETS", "BETA2SER", "GAMS", "MSPIKE", "SPEI"  Lab  Results  Component Value Date   TIBC 295 02/26/2023   TIBC 337 02/23/2023   TIBC 321 09/24/2022   FERRITIN 118 02/26/2023   FERRITIN 64 02/23/2023   FERRITIN 108 09/24/2022   IRONPCTSAT 15 02/26/2023   IRONPCTSAT 11 (L) 02/23/2023   IRONPCTSAT 16 (L) 09/24/2022   Lab Results  Component Value Date   LDH 120 03/09/2023   LDH 132 02/23/2023   LDH 108 09/24/2022    STUDIES:  PCV ECHOCARDIOGRAM COMPLETE 04/14/2023   Narrative Echocardiogram 04/14/2023: Left ventricle cavity is severely dilated. Mild concentric hypertrophy of the left ventricle. Severe global hypokinesis. LVEF <20%. No obvious LV thrombus noted on this noncontrast study.  Doppler evidence of grade II (pseudonormal) diastolic dysfunction, elevated LAP. Device lead seen in RA/RV. Left atrial cavity is severely dilated. Right atrial cavity is severely dilated. Native trileaflet aortic valve. No evidence of aortic stenosis. Mild to moderate aortic regurgitation. Moderate to severe mitral regurgitation. Severe tricuspid regurgitation. Pulmonary hypertension. Estimated pulmonary artery systolic pressure 73 mmHg. Small to moderate circumferential pericardial effusion. Previous study on 01/02/2022 reported mild to moderate tricuspid regurgitation, estimated PASP 55 mmHg.   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)    Malignant melanoma of skin of cheek  (external) (HCC)    Thrombocytopenia (HCC) 07/29/2022    Past Surgical History:  Procedure Laterality Date   APPENDECTOMY     CARDIAC CATHETERIZATION  CORONARY ANGIOPLASTY     PACEMAKER INSERTION     RIGHT/LEFT HEART CATH AND CORONARY ANGIOGRAPHY N/A 10/09/2021   Procedure: RIGHT/LEFT HEART CATH AND CORONARY ANGIOGRAPHY;  Surgeon: Elder Negus, MD;  Location: [facility] INVASIVE CV LAB;  Service: Cardiovascular;  Laterality: N/A;    Family History  Problem Relation Age of Onset   Breast cancer Mother    Diabetes Mellitus II Father    Ovarian cancer Sister    Cancer Brother    Cancer Brother    Cancer Maternal Uncle    Breast cancer Niece     Social History:  reports that he has been smoking cigarettes. He has a 30 pack-year smoking history. He has never used smokeless tobacco. He reports that he does not currently use alcohol. He reports that he does not currently use drugs.The patient is alone today.  Allergies: No Known Allergies  Current Medications: Current Outpatient Medications  Medication Sig Dispense Refill   amiodarone (PACERONE) 200 MG tablet Take 0.5 tablets (100 mg total) by mouth daily. 30 tablet 5   amoxicillin-clavulanate (AUGMENTIN) 875-125 MG tablet Take 1 tablet by mouth 2 (two) times daily. 20 tablet 0   apixaban (ELIQUIS) 5 MG TABS tablet Take 1 tablet (5 mg total) by mouth 2 (two) times daily. 180 tablet 3   atorvastatin (LIPITOR) 80 MG tablet Take 1 tablet (80 mg total) by mouth at bedtime. 90 tablet 3   furosemide (LASIX) 40 MG tablet TAKE 1 TABLET EVERY OTHER DAY 45 tablet 3   magnesium oxide (MAG-OX) 400 (240 Mg) MG tablet TAKE 1 TABLET BY MOUTH TWICE A DAY (Patient taking differently: Take 400 mg by mouth 2 (two) times daily.) 180 tablet 1   metFORMIN (GLUCOPHAGE) 500 MG tablet Take 500 mg by mouth 2 (two) times daily.     metoprolol succinate (TOPROL-XL) 25 MG 24 hr tablet TAKE 0.5 TABLET (12.5 MG TOTAL) BY MOUTH DAILY. (Patient taking differently:  Take 12.5 mg by mouth daily.) 45 tablet 3   metroNIDAZOLE (FLAGYL) 500 MG tablet Take 1 tablet (500 mg total) by mouth 3 (three) times daily. 30 tablet 0   nicotine (NICODERM CQ - DOSED IN MG/24 HOURS) 14 mg/24hr patch PLACE 1 PATCH ONTO THE SKIN DAILY. (Patient not taking: Reported on 06/10/2023) 28 patch 0   spironolactone (ALDACTONE) 25 MG tablet TAKE 1 TABLET BY MOUTH EVERY DAY IN THE MORNING (Patient taking differently: Take 25 mg by mouth daily.) 90 tablet 1   No current facility-administered medications for this visit.

## 2023-08-12 ENCOUNTER — Inpatient Hospital Stay: Payer: Medicare PPO

## 2023-08-12 ENCOUNTER — Inpatient Hospital Stay: Payer: Medicare PPO | Attending: Oncology | Admitting: Hematology and Oncology

## 2023-08-12 ENCOUNTER — Encounter: Payer: Self-pay | Admitting: Hematology and Oncology

## 2023-08-12 VITALS — BP 107/50 | HR 79 | Temp 97.8°F | Resp 20 | Ht 67.0 in | Wt 138.1 lb

## 2023-08-12 DIAGNOSIS — Z85820 Personal history of malignant melanoma of skin: Secondary | ICD-10-CM | POA: Diagnosis not present

## 2023-08-12 DIAGNOSIS — C4339 Malignant melanoma of other parts of face: Secondary | ICD-10-CM

## 2023-08-12 DIAGNOSIS — D519 Vitamin B12 deficiency anemia, unspecified: Secondary | ICD-10-CM | POA: Diagnosis not present

## 2023-08-12 DIAGNOSIS — Z95810 Presence of automatic (implantable) cardiac defibrillator: Secondary | ICD-10-CM | POA: Diagnosis not present

## 2023-08-12 DIAGNOSIS — Z9221 Personal history of antineoplastic chemotherapy: Secondary | ICD-10-CM | POA: Insufficient documentation

## 2023-08-12 DIAGNOSIS — Z8041 Family history of malignant neoplasm of ovary: Secondary | ICD-10-CM | POA: Insufficient documentation

## 2023-08-12 DIAGNOSIS — I509 Heart failure, unspecified: Secondary | ICD-10-CM | POA: Diagnosis not present

## 2023-08-12 DIAGNOSIS — F17210 Nicotine dependence, cigarettes, uncomplicated: Secondary | ICD-10-CM | POA: Insufficient documentation

## 2023-08-12 DIAGNOSIS — E119 Type 2 diabetes mellitus without complications: Secondary | ICD-10-CM | POA: Insufficient documentation

## 2023-08-12 DIAGNOSIS — Z4502 Encounter for adjustment and management of automatic implantable cardiac defibrillator: Secondary | ICD-10-CM | POA: Diagnosis not present

## 2023-08-12 DIAGNOSIS — D509 Iron deficiency anemia, unspecified: Secondary | ICD-10-CM | POA: Diagnosis not present

## 2023-08-12 DIAGNOSIS — Z79899 Other long term (current) drug therapy: Secondary | ICD-10-CM | POA: Diagnosis not present

## 2023-08-12 DIAGNOSIS — Z803 Family history of malignant neoplasm of breast: Secondary | ICD-10-CM | POA: Diagnosis not present

## 2023-08-12 DIAGNOSIS — I5022 Chronic systolic (congestive) heart failure: Secondary | ICD-10-CM | POA: Diagnosis not present

## 2023-08-12 DIAGNOSIS — I3139 Other pericardial effusion (noninflammatory): Secondary | ICD-10-CM | POA: Insufficient documentation

## 2023-08-12 LAB — CMP (CANCER CENTER ONLY)
ALT: 26 U/L (ref 0–44)
AST: 22 U/L (ref 15–41)
Albumin: 3.3 g/dL — ABNORMAL LOW (ref 3.5–5.0)
Alkaline Phosphatase: 105 U/L (ref 38–126)
Anion gap: 11 (ref 5–15)
BUN: 29 mg/dL — ABNORMAL HIGH (ref 8–23)
CO2: 24 mmol/L (ref 22–32)
Calcium: 8.8 mg/dL — ABNORMAL LOW (ref 8.9–10.3)
Chloride: 103 mmol/L (ref 98–111)
Creatinine: 1.41 mg/dL — ABNORMAL HIGH (ref 0.61–1.24)
GFR, Estimated: 50 mL/min — ABNORMAL LOW (ref >60)
Glucose, Bld: 240 mg/dL — ABNORMAL HIGH (ref 70–99)
Potassium: 3.8 mmol/L (ref 3.5–5.1)
Sodium: 138 mmol/L (ref 135–145)
Total Bilirubin: 0.6 mg/dL (ref 0.3–1.2)
Total Protein: 7 g/dL (ref 6.5–8.1)

## 2023-08-12 LAB — CBC WITH DIFFERENTIAL (CANCER CENTER ONLY)
Abs Immature Granulocytes: 0.01 10*3/uL (ref 0.00–0.07)
Basophils Absolute: 0 10*3/uL (ref 0.0–0.1)
Basophils Relative: 1 %
Eosinophils Absolute: 0.1 10*3/uL (ref 0.0–0.5)
Eosinophils Relative: 2 %
HCT: 35.6 % — ABNORMAL LOW (ref 39.0–52.0)
Hemoglobin: 10.7 g/dL — ABNORMAL LOW (ref 13.0–17.0)
Immature Granulocytes: 0 %
Lymphocytes Relative: 9 %
Lymphs Abs: 0.4 10*3/uL — ABNORMAL LOW (ref 0.7–4.0)
MCH: 29.6 pg (ref 26.0–34.0)
MCHC: 30.1 g/dL (ref 30.0–36.0)
MCV: 98.3 fL (ref 80.0–100.0)
Monocytes Absolute: 0.5 10*3/uL (ref 0.1–1.0)
Monocytes Relative: 10 %
Neutro Abs: 3.7 10*3/uL (ref 1.7–7.7)
Neutrophils Relative %: 78 %
Platelet Count: 188 10*3/uL (ref 150–400)
RBC: 3.62 MIL/uL — ABNORMAL LOW (ref 4.22–5.81)
RDW: 16.9 % — ABNORMAL HIGH (ref 11.5–15.5)
WBC Count: 4.8 10*3/uL (ref 4.0–10.5)
nRBC: 0 % (ref 0.0–0.2)

## 2023-08-12 LAB — TSH: TSH: 2.643 u[IU]/mL (ref 0.350–4.500)

## 2023-08-12 MED ORDER — HEPARIN SOD (PORK) LOCK FLUSH 100 UNIT/ML IV SOLN
500.0000 [IU] | Freq: Once | INTRAVENOUS | Status: AC | PRN
  Administered 2023-08-12: 500 [IU]

## 2023-08-12 MED ORDER — SODIUM CHLORIDE 0.9% FLUSH
10.0000 mL | INTRAVENOUS | Status: DC | PRN
  Administered 2023-08-12: 10 mL

## 2023-08-12 NOTE — Assessment & Plan Note (Signed)
His glucose is much higher than usual today. I will have him follow up with his PCP, Lonie Peak, PA-C.

## 2023-08-12 NOTE — Assessment & Plan Note (Addendum)
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  He had a hypermetabolic right submandibular node on PET, but no evidence of distant metastasis.  His original lesion was diagnosed in March 2016.  He underwent excision, but refused aggressive therapy.  He has been receiving palliative nivolumab since November 2016, with several interruptions. His treatment was on hold from December 2022 to April 2023 due to cardiorespiratory issues. CT imaging in August 2023 remains without evidence of metastatic disease.  However, there were increasing bilateral pleural effusions and small volume abdominal pelvic ascites, with stable pericardial effusion, felt to be most likely due to fluid overload.  His heart failure is managed by cardiology.  CT scans in February showed frank pulmonary edema, but no evidence of metastatic or recurrent melanoma.  He continues to follow with cardiology.  Dr. Gilman Buttner recommended follow-up CT imaging in 6 months.  He received a 22nd cycle of nivolumab in March.  His 23rd cycle was delayed due to C. difficile in April.  He received 23rd cycle on June 5.  He was due for treatment on July 3, but did not keep his follow-up and labs on June 28 prior to infusion.  He was seen on July 23 to a 24th cycle of nivolumab, but due to severe diarrhea, treatment was held.  I was concerned that this may represent immune mediated diarrhea, but did not immediately start steroids.  Patient was scheduled for follow-up the following week, but he was in the emergency room on July 31 with AICD shocks.  He was seen by cardiology and placed on valsartan 80 mg daily.  His metoprolol was increased to 50 mg daily.  His daughter did not contact us back to reschedule although we were able to reach her to bring him in today.  He remains without evidence of recurrent disease.  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  I would recommend obtaining  repeat staging with a CT face, CT chest, abdomen and pelvis to ensure there is no evidence of disease.  If this is the case, we will place him on observation and plan to see him back in 3 months. I will flush his port today.

## 2023-08-12 NOTE — Assessment & Plan Note (Signed)
He was found to have iron deficiency in July 2023.  He was given IV iron at that time.  His hemoglobin had dropped again in April.  Iron studies in April revealed recurrent iron deficiency, so he received a dose of Feraheme on June 5. His hemoglobin has dropped, so I will repeat iron studies, as well as B12 and folate.

## 2023-08-12 NOTE — Assessment & Plan Note (Signed)
His B12 injections have been given sporadically as well.  Repeat B12 level in April was normal.  He last received B12 on June 5. His hemoglobin has dropped, so I will repeat B12 and folate, as well as iron studies.

## 2023-08-13 ENCOUNTER — Telehealth: Payer: Self-pay

## 2023-08-13 ENCOUNTER — Other Ambulatory Visit: Payer: Self-pay

## 2023-08-13 DIAGNOSIS — D509 Iron deficiency anemia, unspecified: Secondary | ICD-10-CM

## 2023-08-13 DIAGNOSIS — C4339 Malignant melanoma of other parts of face: Secondary | ICD-10-CM

## 2023-08-13 LAB — T4: T4, Total: 8.6 ug/dL (ref 4.5–12.0)

## 2023-08-13 NOTE — Telephone Encounter (Signed)
-----   Message from Adah Perl sent at 08/12/2023  6:43 PM EDT ----- Please call his daughter Burnett Harry and tell her his sugar was 240, so I would like him to see Lonie Peak, thanks

## 2023-08-14 ENCOUNTER — Encounter: Payer: Self-pay | Admitting: Oncology

## 2023-08-20 DIAGNOSIS — I2721 Secondary pulmonary arterial hypertension: Secondary | ICD-10-CM | POA: Diagnosis not present

## 2023-08-20 DIAGNOSIS — I10 Essential (primary) hypertension: Secondary | ICD-10-CM | POA: Diagnosis not present

## 2023-08-20 DIAGNOSIS — I5040 Unspecified combined systolic (congestive) and diastolic (congestive) heart failure: Secondary | ICD-10-CM | POA: Diagnosis not present

## 2023-08-20 DIAGNOSIS — E46 Unspecified protein-calorie malnutrition: Secondary | ICD-10-CM | POA: Diagnosis not present

## 2023-08-20 DIAGNOSIS — C779 Secondary and unspecified malignant neoplasm of lymph node, unspecified: Secondary | ICD-10-CM | POA: Diagnosis not present

## 2023-08-20 DIAGNOSIS — E1129 Type 2 diabetes mellitus with other diabetic kidney complication: Secondary | ICD-10-CM | POA: Diagnosis not present

## 2023-08-20 DIAGNOSIS — R809 Proteinuria, unspecified: Secondary | ICD-10-CM | POA: Diagnosis not present

## 2023-08-20 DIAGNOSIS — Z1331 Encounter for screening for depression: Secondary | ICD-10-CM | POA: Diagnosis not present

## 2023-08-21 ENCOUNTER — Telehealth: Payer: Self-pay | Admitting: Oncology

## 2023-08-21 NOTE — Telephone Encounter (Signed)
08/21/23 Spoke with daughter and confirmed ct scan appt and f/u.

## 2023-08-25 DIAGNOSIS — N281 Cyst of kidney, acquired: Secondary | ICD-10-CM | POA: Diagnosis not present

## 2023-08-25 DIAGNOSIS — R918 Other nonspecific abnormal finding of lung field: Secondary | ICD-10-CM | POA: Diagnosis not present

## 2023-08-25 DIAGNOSIS — R188 Other ascites: Secondary | ICD-10-CM | POA: Diagnosis not present

## 2023-08-25 DIAGNOSIS — Z95810 Presence of automatic (implantable) cardiac defibrillator: Secondary | ICD-10-CM | POA: Diagnosis not present

## 2023-08-25 DIAGNOSIS — C4330 Malignant melanoma of unspecified part of face: Secondary | ICD-10-CM | POA: Diagnosis not present

## 2023-08-25 DIAGNOSIS — C4339 Malignant melanoma of other parts of face: Secondary | ICD-10-CM | POA: Diagnosis not present

## 2023-08-25 DIAGNOSIS — J90 Pleural effusion, not elsewhere classified: Secondary | ICD-10-CM | POA: Diagnosis not present

## 2023-08-25 DIAGNOSIS — Z0189 Encounter for other specified special examinations: Secondary | ICD-10-CM | POA: Diagnosis not present

## 2023-08-25 DIAGNOSIS — K8020 Calculus of gallbladder without cholecystitis without obstruction: Secondary | ICD-10-CM | POA: Diagnosis not present

## 2023-08-25 DIAGNOSIS — I517 Cardiomegaly: Secondary | ICD-10-CM | POA: Diagnosis not present

## 2023-08-25 DIAGNOSIS — N289 Disorder of kidney and ureter, unspecified: Secondary | ICD-10-CM | POA: Diagnosis not present

## 2023-08-25 LAB — HEPATIC FUNCTION PANEL
ALT: 19 U/L (ref 10–40)
AST: 25 (ref 14–40)
Alkaline Phosphatase: 119 (ref 25–125)
Bilirubin, Total: 0.8

## 2023-08-25 LAB — COMPREHENSIVE METABOLIC PANEL
Albumin: 3.7 (ref 3.5–5.0)
Calcium: 8.6 — AB (ref 8.7–10.7)
EGFR: 58

## 2023-08-25 LAB — CBC AND DIFFERENTIAL
HCT: 32 — AB (ref 41–53)
Hemoglobin: 10.6 — AB (ref 13.5–17.5)
Neutrophils Absolute: 3.7
Platelets: 207 10*3/uL (ref 150–400)
WBC: 5

## 2023-08-25 LAB — BASIC METABOLIC PANEL
BUN: 34 — AB (ref 4–21)
CO2: 25 — AB (ref 13–22)
Chloride: 107 (ref 99–108)
Creatinine: 1.2 (ref 0.6–1.3)
Glucose: 103
Potassium: 4.7 meq/L (ref 3.5–5.1)
Sodium: 135 — AB (ref 137–147)

## 2023-08-25 LAB — CBC: RBC: 3.41 — AB (ref 3.87–5.11)

## 2023-09-01 DIAGNOSIS — I5040 Unspecified combined systolic (congestive) and diastolic (congestive) heart failure: Secondary | ICD-10-CM | POA: Diagnosis not present

## 2023-09-01 DIAGNOSIS — J9611 Chronic respiratory failure with hypoxia: Secondary | ICD-10-CM | POA: Diagnosis not present

## 2023-09-01 DIAGNOSIS — J449 Chronic obstructive pulmonary disease, unspecified: Secondary | ICD-10-CM | POA: Diagnosis not present

## 2023-09-04 ENCOUNTER — Other Ambulatory Visit: Payer: Self-pay | Admitting: Oncology

## 2023-09-04 ENCOUNTER — Encounter: Payer: Self-pay | Admitting: Oncology

## 2023-09-10 ENCOUNTER — Encounter: Payer: Self-pay | Admitting: Hematology and Oncology

## 2023-09-21 ENCOUNTER — Other Ambulatory Visit: Payer: Self-pay | Admitting: Oncology

## 2023-09-21 ENCOUNTER — Encounter: Payer: Self-pay | Admitting: Hematology and Oncology

## 2023-10-02 DIAGNOSIS — J449 Chronic obstructive pulmonary disease, unspecified: Secondary | ICD-10-CM | POA: Diagnosis not present

## 2023-10-02 DIAGNOSIS — J9611 Chronic respiratory failure with hypoxia: Secondary | ICD-10-CM | POA: Diagnosis not present

## 2023-10-02 DIAGNOSIS — I5040 Unspecified combined systolic (congestive) and diastolic (congestive) heart failure: Secondary | ICD-10-CM | POA: Diagnosis not present

## 2023-10-14 ENCOUNTER — Ambulatory Visit: Payer: Medicare PPO | Attending: Cardiology | Admitting: Cardiology

## 2023-10-14 ENCOUNTER — Encounter: Payer: Self-pay | Admitting: Hematology and Oncology

## 2023-10-14 ENCOUNTER — Other Ambulatory Visit: Payer: Self-pay | Admitting: *Deleted

## 2023-10-14 ENCOUNTER — Encounter: Payer: Self-pay | Admitting: Cardiology

## 2023-10-14 VITALS — BP 98/50 | HR 74 | Resp 16 | Ht 67.0 in | Wt 162.0 lb

## 2023-10-14 DIAGNOSIS — I480 Paroxysmal atrial fibrillation: Secondary | ICD-10-CM

## 2023-10-14 DIAGNOSIS — I5023 Acute on chronic systolic (congestive) heart failure: Secondary | ICD-10-CM

## 2023-10-14 DIAGNOSIS — I10 Essential (primary) hypertension: Secondary | ICD-10-CM

## 2023-10-14 DIAGNOSIS — I2510 Atherosclerotic heart disease of native coronary artery without angina pectoris: Secondary | ICD-10-CM

## 2023-10-14 MED ORDER — DAPAGLIFLOZIN PROPANEDIOL 10 MG PO TABS: 10.0000 mg | ORAL_TABLET | Freq: Every day | ORAL | 0 refills | Status: DC

## 2023-10-14 MED ORDER — DAPAGLIFLOZIN PROPANEDIOL 10 MG PO TABS
10.0000 mg | ORAL_TABLET | Freq: Every day | ORAL | 1 refills | Status: DC
Start: 2023-10-14 — End: 2024-02-24

## 2023-10-14 NOTE — Progress Notes (Signed)
Cardiology Office Note:  .   Date:  10/14/2023  ID:  Kevin [name], DOB [birth year], MRN [id number] PCP: Lonie Peak, PA-C   [facility]Care Providers Cardiologist:  Yates Decamp, MD   History of Present Illness: .   Kevin [name] is a 81 y.o. Caucasian male  with hypertension, type 2 DM with stage IIIa chronic kidney disease, COPD with ongoing tobacco use and severe nicotine dependence, CAD s/p  NSTEMI and PCI in 2015, patent by cath in 09/2021, ischemic cardiomyopathy with severe LV systolic dysfunction, chronic bilateral leg edema, h/o ventricular tachycardia SP Abbott dual-chamber ICD implantation  in 2015, paroxysmal atrial fibrillation and presently on long-term anticoagulation with Eliquis, melanoma diagnosed in 2016-currently on monthly chemotherapy which he completed it in Aug 2024.   Discussed the use of AI scribe software for clinical note transcription with the patient, who gave verbal consent to proceed.  History of Present Illness   Mr. Kevin [name], a patient with a history of heart failure, lung cancer, and skin cancer, presents with leg swelling. The swelling had improved for a period of two to three months while the patient was on an antibiotic for bronchitis, but has since returned. He also reports no difficulty breathing and uses three thin pillows for sleep, a long-standing habit. He denies waking up in the middle of the night with difficulty breathing, a symptom he has experienced in the past. The patient's diet includes occasional consumption of fried food and chips. He reports a high fluid intake, including a case of water over two weeks and occasional soda. He is currently taking furosemide, a diuretic, once daily in the morning.   The patient has a history of smoking and continues to smoke, albeit less than before. He has previously tried nicotine patches to quit smoking but experienced adverse effects, including feeling hot and unwell.    Review of Systems   Cardiovascular:  Positive for dyspnea on exertion and leg swelling. Negative for chest pain.  Respiratory:  Positive for cough (chronic) and wheezing.     Labs   Lab Results  Component Value Date   CHOL 107 02/26/2023   HDL 60 02/26/2023   LDLCALC 35 02/26/2023   TRIG 49 02/26/2023   Lab Results  Component Value Date   NA 135 (A) 08/25/2023   K 4.7 08/25/2023   CO2 25 (A) 08/25/2023   GLUCOSE 240 (H) 08/12/2023   BUN 34 (A) 08/25/2023   CREATININE 1.2 08/25/2023   CALCIUM 8.6 (A) 08/25/2023   EGFR 58.0 08/25/2023   GFRNONAA 50 (L) 08/12/2023      Latest Ref Rng & Units 08/25/2023   12:00 AM 08/12/2023    2:32 PM 06/10/2023   12:33 PM  BMP  Glucose 70 - 99 mg/dL  782  956   BUN 4 - 21 34     29  28   Creatinine 0.6 - 1.3 1.2     1.41  1.39   Sodium 137 - 147 135     138  140   Potassium 3.5 - 5.1 mEq/L 4.7     3.8  4.4   Chloride 99 - 108 107     103  108   CO2 13 - 22 25     24  22    Calcium 8.7 - 10.7 8.6     8.8  9.0      This result is from an external source.      Latest Ref Rng & Units 08/25/2023  12:00 AM 08/12/2023    2:32 PM 06/10/2023   12:33 PM  CBC  WBC  5.0     4.8  5.5   Hemoglobin 13.5 - 17.5 10.6     10.7  11.5   Hematocrit 41 - 53 32     35.6  37.3   Platelets 150 - 400 K/uL 207     188  207      This result is from an external source.    Physical Exam:   VS:  BP (!) 98/50 (BP Location: Right Arm, Patient Position: Sitting, Cuff Size: Normal)   Pulse 74   Resp 16   Ht 5\' 7"  (1.702 m)   Wt 162 lb (73.5 kg)   SpO2 93%   BMI 25.37 kg/m    Wt Readings from Last 3 Encounters:  10/14/23 162 lb (73.5 kg)  08/12/23 138 lb 1.6 oz (62.6 kg)  06/10/23 130 lb (59 kg)    Physical Exam Vitals reviewed.  Constitutional:      Appearance: He is ill-appearing.     Comments: Frail  Neck:     Vascular: Carotid bruit (bilateral) and JVD present.  Cardiovascular:     Rate and Rhythm: Normal rate and regular rhythm.     Pulses: Decreased pulses.           Dorsalis pedis pulses are 0 on the right side and 0 on the left side.       Posterior tibial pulses are 0 on the right side and 0 on the left side.     Heart sounds: S1 normal and S2 normal. Heart sounds are distant. Murmur heard.     Midsystolic murmur is present with a grade of 2/6 radiating to the apex.     No gallop.  Pulmonary:     Effort: Pulmonary effort is normal.     Breath sounds: Rales (scattered) present.  Abdominal:     General: Bowel sounds are normal.     Palpations: Abdomen is soft.  Musculoskeletal:     Right lower leg: Edema (2+ pitting to knee) present.     Left lower leg: Edema (2+ pitting to knee) present.    Studies Reviewed: .    Right/left heart catheterization and coronary angiography 10/09/2021: LM: Normal LAD: Patent (2015  3 x 24 mm DES) mid LAD stent. No significant disease Lcx: No significant disease RCA: Ostial 100% occlusion. Left-to-right collateral up to mid RCA      RA: 11 mmHg RV: 51/0 mmHg PA: 64/22 mmHg, mPAP 34 mmHg PCW: 23 mmHg   CO: 3.6 L/min CI: 2.0 L/min/m2   Impression: Mildly decompensated nonischemic cardiomyopathy LV size and function out of proportion to RCA occlusion and patent mid LAD stent Recommend GDMT for heart failure   PCV ECHOCARDIOGRAM COMPLETE 04/14/2023   Narrative Echocardiogram 04/14/2023: Left ventricle cavity is severely dilated. Mild concentric hypertrophy of the left ventricle. Severe global hypokinesis. LVEF <20%. No obvious LV thrombus noted on this noncontrast study.  Doppler evidence of grade II (pseudonormal) diastolic dysfunction, elevated LAP. Device lead seen in RA/RV. Left atrial cavity is severely dilated. Right atrial cavity is severely dilated. Native trileaflet aortic valve. No evidence of aortic stenosis. Mild to moderate aortic regurgitation. Moderate to severe mitral regurgitation. Severe tricuspid regurgitation. Pulmonary hypertension. Estimated pulmonary artery systolic pressure  73 mmHg. Small to moderate circumferential pericardial effusion. Previous study on 01/02/2022 reported mild to moderate tricuspid regurgitation, estimated PASP 55 mmHg.  EKG:   EKG 06/10/2023:  Sinus rhythm with occasional PACs, left bundle branch block.  No further analysis.  No significant change from 08/14/2022.  Medications and allergies    No Known Allergies   Current Outpatient Medications:    albuterol (VENTOLIN HFA) 108 (90 Base) MCG/ACT inhaler, Inhale 2 puffs into the lungs every 6 (six) hours as needed., Disp: , Rfl:    amiodarone (PACERONE) 200 MG tablet, Take 0.5 tablets (100 mg total) by mouth daily., Disp: 30 tablet, Rfl: 5   apixaban (ELIQUIS) 5 MG TABS tablet, Take 1 tablet (5 mg total) by mouth 2 (two) times daily., Disp: 180 tablet, Rfl: 3   atorvastatin (LIPITOR) 80 MG tablet, Take 1 tablet (80 mg total) by mouth at bedtime., Disp: 90 tablet, Rfl: 3   dapagliflozin propanediol (FARXIGA) 10 MG TABS tablet, Take 1 tablet (10 mg total) by mouth daily before breakfast., Disp: 90 tablet, Rfl: 1   dapagliflozin propanediol (FARXIGA) 10 MG TABS tablet, Take 1 tablet (10 mg total) by mouth daily before breakfast., Disp: 28 tablet, Rfl: 0   furosemide (LASIX) 40 MG tablet, TAKE 1 TABLET EVERY OTHER DAY (Patient taking differently: Take 40 mg by mouth daily.), Disp: 45 tablet, Rfl: 3   magnesium oxide (MAG-OX) 400 (240 Mg) MG tablet, TAKE 1 TABLET BY MOUTH TWICE A DAY, Disp: 180 tablet, Rfl: 1   metFORMIN (GLUCOPHAGE) 500 MG tablet, Take 500 mg by mouth 2 (two) times daily., Disp: , Rfl:    metoprolol succinate (TOPROL-XL) 25 MG 24 hr tablet, TAKE 0.5 TABLET (12.5 MG TOTAL) BY MOUTH DAILY. (Patient taking differently: Take 12.5 mg by mouth daily.), Disp: 45 tablet, Rfl: 3   metroNIDAZOLE (FLAGYL) 500 MG tablet, Take 1 tablet (500 mg total) by mouth 3 (three) times daily., Disp: 30 tablet, Rfl: 0   nicotine (NICODERM CQ - DOSED IN MG/24 HOURS) 14 mg/24hr patch, PLACE 1 PATCH ONTO THE  SKIN DAILY., Disp: 28 patch, Rfl: 0   spironolactone (ALDACTONE) 25 MG tablet, TAKE 1 TABLET BY MOUTH EVERY DAY IN THE MORNING (Patient taking differently: Take 25 mg by mouth daily.), Disp: 90 tablet, Rfl: 1   SYMBICORT 80-4.5 MCG/ACT inhaler, Inhale 2 puffs into the lungs 2 (two) times daily., Disp: , Rfl:    TRUEplus Lancets 33G MISC, , Disp: , Rfl:    ondansetron (ZOFRAN-ODT) 4 MG disintegrating tablet, Take 4 mg by mouth every 8 (eight) hours as needed. (Patient not taking: Reported on 10/14/2023), Disp: , Rfl:    ASSESSMENT AND PLAN: .      ICD-10-CM   1. Coronary artery disease involving native coronary artery of native heart without angina pectoris  I25.10     2. Acute on chronic systolic heart failure (HCC)  Z61.09 dapagliflozin propanediol (FARXIGA) 10 MG TABS tablet    Basic Metabolic Panel (BMET)    Pro b natriuretic peptide    dapagliflozin propanediol (FARXIGA) 10 MG TABS tablet    3. Paroxysmal atrial fibrillation (HCC)  I48.0     4. Primary hypertension  I10      Assessment and Plan    Acute on Chronic Systolic Heart Failure Leg swelling and fluid retention. Discussed the importance of fluid and salt restriction. Patient is currently on furosemide 40 mg, metoprolol succinate 25 mg, spironolactone 25mg  once a day, and have been unable to use guideline directed medical therapy due to soft blood pressure and stage IIIa chronic kidney disease.  Will add Farxiga 10 mg daily Last echocardiogram 6 months ago revealed ejection fraction of  20% with severe pulm hypertension, PA pressures have increased.  His leg edema could also be related to chronic cor pulmonale with evidence of mostly right-sided heart failure in view of underlying severe COPD.  If symptoms are not improved, could consider repeating echocardiogram. . -Add Farxiga to help with fluid management. -Continue furosemide once daily. -Monitor weight daily and take an extra dose of Lasix if weight increases by more than 3  pounds in one day. -Check BMP and BNP 2-3 days before next appointment in one month.  Smoking Patient continues to smoke, has tried patches in the past with poor tolerance. -Encouraged to reduce and eventually quit smoking.  Paroxysmal atrial fibrillation Patient presently tolerating anticoagulation with Eliquis.  Last EKG in July revealed maintenance of sinus rhythm.  Today's physical examination reveals regular rhythm on auscultation.  Rate is well-controlled.  Presently on 100 mg of amiodarone daily.  External labs reviewed.  Stable CBC and renal function.  Hypertension Although previously hypertension, his blood pressure has been soft.  Presently on metoprolol and spironolactone as dictated above.  Continue the same. -Return in 4 weeks for follow-up with nurse practitioner or PA.  I also discussed with them to call us if symptoms are not improved or weight gain continues to go on and leg edema has not improved as well to avoid hospitalization.  I spent a total of 38 minutes on patient care today.  Patient's daughter is present and all questions answered.  Written instructions were given.  Signed,  Yates Decamp, MD, [facility] 10/14/2023, 1:39 PM [facility] [address] [place], [place] [address] Phone: [phone]. Fax:  [phone]

## 2023-10-14 NOTE — Patient Instructions (Signed)
Medication Instructions:  Your physician has recommended you make the following change in your medication: Start Farxiga 10 mg by mouth daily   *If you need a refill on your cardiac medications before your next appointment, please call your pharmacy*   Lab Work: Your physician recommends that you return for lab work in: about 3 weeks prior to appointment. BMP and BNP.  This is not fasting.   If you have labs (blood work) drawn today and your tests are completely normal, you will receive your results only by: MyChart Message (if you have MyChart) OR A paper copy in the mail If you have any lab test that is abnormal or we need to change your treatment, we will call you to review the results.   Testing/Procedures: none   Follow-Up: At [facility], you and your health needs are our priority.  As part of our continuing mission to provide you with exceptional heart care, we have created designated Provider Care Teams.  These Care Teams include your primary Cardiologist (physician) and Advanced Practice Providers (APPs -  Physician Assistants and Nurse Practitioners) who all work together to provide you with the care you need, when you need it.  We recommend signing up for the patient portal called "MyChart".  Sign up information is provided on this After Visit Summary.  MyChart is used to connect with patients for Virtual Visits (Telemedicine).  Patients are able to view lab/test results, encounter notes, upcoming appointments, etc.  Non-urgent messages can be sent to your provider as well.   To learn more about what you can do with MyChart, go to [email or web address].    Your next appointment:   3 week(s)   Provider:   APP  Other Instructions

## 2023-10-16 ENCOUNTER — Other Ambulatory Visit (HOSPITAL_COMMUNITY): Payer: Self-pay

## 2023-10-28 ENCOUNTER — Other Ambulatory Visit: Payer: Self-pay | Admitting: Cardiology

## 2023-10-28 DIAGNOSIS — I4891 Unspecified atrial fibrillation: Secondary | ICD-10-CM

## 2023-10-28 NOTE — Telephone Encounter (Signed)
Eliquis 5mg  refill request received. Patient is 82 years old, weight-73.5kg, Crea-1.2 on 08/25/23, Diagnosis-Afib, and last seen by Dr. Jacinto Halim on 10/14/23. Dose is appropriate based on dosing criteria. Will send in refill to requested pharmacy.

## 2023-11-01 DIAGNOSIS — J449 Chronic obstructive pulmonary disease, unspecified: Secondary | ICD-10-CM | POA: Diagnosis not present

## 2023-11-01 DIAGNOSIS — I5040 Unspecified combined systolic (congestive) and diastolic (congestive) heart failure: Secondary | ICD-10-CM | POA: Diagnosis not present

## 2023-11-01 DIAGNOSIS — J9611 Chronic respiratory failure with hypoxia: Secondary | ICD-10-CM | POA: Diagnosis not present

## 2023-11-02 ENCOUNTER — Telehealth: Payer: Self-pay | Admitting: Cardiology

## 2023-11-02 NOTE — Telephone Encounter (Signed)
Patient's daughter is requesting to speak with Dr. Verl Dicker nurse. Please advise.

## 2023-11-02 NOTE — Telephone Encounter (Signed)
I spoke with patient who gave me permission to speak with his daughter.  Patient had 3 week follow up appointment with Perlie Gold, PA scheduled for 11/12/23.  This appointment was cancelled due to change in provider's schedule.  Patient needs close follow up.  Appointment made for patient to see Dr Flora Lipps (NL DOD) on 11/13/23 at 1:30.

## 2023-11-12 ENCOUNTER — Ambulatory Visit: Payer: Medicare PPO | Admitting: Cardiology

## 2023-11-12 NOTE — Progress Notes (Signed)
 Cardiology Office Note:  .   Date:  11/13/2023  ID:  Kevin [name], DOB Sep 25, 1941, MRN [id number] PCP: Montey Lot, PA-C  [facility] Providers Cardiologist:  Gordy Bergamo, MD { History of Present Illness: .   Kevin [name] is a 83 y.o. male with below history who presents for follow-up. Seen 12/4 with worsening HF. Close follow-up arranged.    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 The patient reports persistent swelling in the feet and shortness of breath, which occurs even with minimal exertion such as walking around the house. The patient denies experiencing any chest pain or pressure. The patient's weight has been relatively stable, with a noted weight loss in July, but some weight has since been regained. The patient also has a history of cancer, which has been treated. The patient reports frequent urination, particularly at night, which disrupts sleep.   Seen last month and farxiga  added. No improvement. Remains volume overloaded. EKG with QRS 178 ms.          Problem List CAD -NSTEMI 2015 -LHC 2022: occluded RCA, patent LAD stent  2. Systolic HF -EF <20% -ischemic -s/p ICD 3. VT 4. Paroxysmal Afib  5. DM 6. 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 LBBB -QRS 178 msec    ROS: All other ROS reviewed and negative. Pertinent positives noted in the HPI.     Studies Reviewed: SABRA   EKG Interpretation Date/Time:  Friday November 13 2023 13:25:24 EST Ventricular Rate:  71 PR Interval:    QRS Duration:  178 QT Interval:  474 QTC Calculation: 515 R Axis:   -70  Text Interpretation: Wide QRS rhythm Left axis deviation Left bundle branch block Confirmed by Barbaraann Kotyk [phone]) on 11/13/2023 2:00:24 PM   Physical  Exam:   VS:  BP 100/60   Pulse 71   Ht 5' 6 (1.676 m)   Wt 165 lb (74.8 kg)   SpO2 96%   BMI 26.63 kg/m    Wt Readings from Last 3 Encounters:  11/13/23 165 lb (74.8 kg)  10/14/23 162 lb (73.5 kg)  08/12/23 138 lb 1.6 oz (62.6 kg)    GEN: Well nourished, well developed in no acute distress NECK:JVD 15-20 cm H20 CARDIAC: RRR, no murmurs, rubs, S3+ RESPIRATORY:  rrales ABDOMEN: Soft, non-tender, non-distended EXTREMITIES:  2+ pitting edema up to mid shins  ASSESSMENT AND PLAN: .   Assessment and Plan    Decompensated Systolic Heart Failure, <20% Severe volume overload with elevated JVD and 2+ pitting edema. Severe RV dysfunction and pulmonary hypertension. LVEF <20%. Intolerant of most heart failure medications. Currently on Farxiga  10mg  daily, Aldactone  25mg  daily, Metoprolol  succinate 12.5mg  daily. -Switch from Lasix  to Torsemide  20mg  BID for 3 days, then 20mg  daily. -Check labs today including CMP and BNP. -Refer to advanced heart failure clinic for evaluation of potential advanced therapies. -Return for APP visit in 4 weeks. If symptoms do not improve, consider hospital admission. -LBBB is present, could consider BiV upgrade.   Atrial Fibrillation Maintaining sinus rhythm on Amiodarone  200mg  daily. On Eliquis  5mg  BID. -Continue current regimen. -Check labs today to confirm appropriate Eliquis  dosing.  Coronary Artery Disease Occluded RCA, no symptoms of angina. Lipids not at goal. -Defer lipid management to primary care cardiologist.  Follow-up: Return in about 6 months (around 05/12/2024).  Time Spent with Patient: I have spent a total of 35 minutes caring for this patient today face to face, ordering and reviewing labs/tests, reviewing prior records/medical history, examining the patient, establishing an assessment and plan, communicating results/findings to the patient/family, and documenting in the medical record.   Signed, Darryle DASEN. Barbaraann, MD,  [facility]  [facility]  [address] [place], [place] [address] [phone]  2:13 PM

## 2023-11-13 ENCOUNTER — Encounter: Payer: Self-pay | Admitting: Cardiovascular Disease

## 2023-11-13 ENCOUNTER — Ambulatory Visit: Payer: Medicare PPO | Attending: Cardiovascular Disease | Admitting: Cardiovascular Disease

## 2023-11-13 VITALS — BP 100/60 | HR 71 | Ht 66.0 in | Wt 165.0 lb

## 2023-11-13 DIAGNOSIS — I2510 Atherosclerotic heart disease of native coronary artery without angina pectoris: Secondary | ICD-10-CM

## 2023-11-13 DIAGNOSIS — I5023 Acute on chronic systolic (congestive) heart failure: Secondary | ICD-10-CM | POA: Diagnosis not present

## 2023-11-13 DIAGNOSIS — I480 Paroxysmal atrial fibrillation: Secondary | ICD-10-CM | POA: Diagnosis not present

## 2023-11-13 MED ORDER — TORSEMIDE 20 MG PO TABS: ORAL_TABLET | ORAL | 3 refills | Status: DC

## 2023-11-13 NOTE — Patient Instructions (Signed)
 Medication Instructions:  Start Torsemide  20 mg twice a day (40 mg total) for 3 days, then 20 mg daily  *If you need a refill on your cardiac medications before your next appointment, please call your pharmacy*   Lab Work: CMP BNP  If you have labs (blood work) drawn today and your tests are completely normal, you will receive your results only by: MyChart Message (if you have MyChart) OR A paper copy in the mail If you have any lab test that is abnormal or we need to change your treatment, we will call you to review the results.   Testing/Procedures: NONE  Follow-Up: At [facility], you and your health needs are our priority.  As part of our continuing mission to provide you with exceptional heart care, we have created designated Provider Care Teams.  These Care Teams include your primary Cardiologist (physician) and Advanced Practice Providers (APPs -  Physician Assistants and Nurse Practitioners) who all work together to provide you with the care you need, when you need it.  We recommend signing up for the patient portal called MyChart.  Sign up information is provided on this After Visit Summary.  MyChart is used to connect with patients for Virtual Visits (Telemedicine).  Patients are able to view lab/test results, encounter notes, upcoming appointments, etc.  Non-urgent messages can be sent to your provider as well.   To learn more about what you can do with MyChart, go to [email or web address].    Your next appointment:   4 week(s)  Provider:   Shona Shad, PA-C, Josefa Beauvais, FNP, Jon Hails, PA-C, Callie Goodrich, PA-C, Kathleen Johnson, PA-C, Delon Holts, PA-C, Lamarr Satterfield, DNP, ANP, Hao Meng, PA-C, Damien Braver, NP, Katlyn West, NP  Other Instructions Referral to Advance Heart Failure Clinic

## 2023-11-14 LAB — BRAIN NATRIURETIC PEPTIDE: BNP: 2529.1 pg/mL — ABNORMAL HIGH (ref 0.0–100.0)

## 2023-11-14 LAB — COMPREHENSIVE METABOLIC PANEL
ALT: 14 [IU]/L (ref 0–44)
AST: 16 [IU]/L (ref 0–40)
Albumin: 4.1 g/dL (ref 3.7–4.7)
Alkaline Phosphatase: 134 [IU]/L — ABNORMAL HIGH (ref 44–121)
BUN/Creatinine Ratio: 16 (ref 10–24)
BUN: 29 mg/dL — ABNORMAL HIGH (ref 8–27)
Bilirubin Total: 0.4 mg/dL (ref 0.0–1.2)
CO2: 24 mmol/L (ref 20–29)
Calcium: 8.7 mg/dL (ref 8.6–10.2)
Chloride: 104 mmol/L (ref 96–106)
Creatinine, Ser: 1.78 mg/dL — ABNORMAL HIGH (ref 0.76–1.27)
Globulin, Total: 2.6 g/dL (ref 1.5–4.5)
Glucose: 161 mg/dL — ABNORMAL HIGH (ref 70–99)
Potassium: 4.8 mmol/L (ref 3.5–5.2)
Sodium: 144 mmol/L (ref 134–144)
Total Protein: 6.7 g/dL (ref 6.0–8.5)
eGFR: 38 mL/min/{1.73_m2} — ABNORMAL LOW (ref >59)

## 2023-11-17 ENCOUNTER — Other Ambulatory Visit: Payer: Self-pay

## 2023-11-17 ENCOUNTER — Encounter (HOSPITAL_COMMUNITY): Payer: Self-pay | Admitting: Internal Medicine

## 2023-11-17 ENCOUNTER — Ambulatory Visit (HOSPITAL_COMMUNITY)
Admission: RE | Admit: 2023-11-17 | Discharge: 2023-11-17 | Disposition: A | Discharge status: Home / Self Care | Payer: Medicare PPO | Source: Ambulatory Visit | Attending: Internal Medicine | Admitting: Internal Medicine

## 2023-11-17 VITALS — BP 102/60 | HR 73 | Wt 159.6 lb

## 2023-11-17 DIAGNOSIS — I5022 Chronic systolic (congestive) heart failure: Secondary | ICD-10-CM | POA: Insufficient documentation

## 2023-11-17 DIAGNOSIS — I2510 Atherosclerotic heart disease of native coronary artery without angina pectoris: Secondary | ICD-10-CM

## 2023-11-17 DIAGNOSIS — Z720 Tobacco use: Secondary | ICD-10-CM

## 2023-11-17 LAB — COMPREHENSIVE METABOLIC PANEL
ALT: 18 U/L (ref 0–44)
AST: 16 U/L (ref 15–41)
Albumin: 3.7 g/dL (ref 3.5–5.0)
Alkaline Phosphatase: 108 U/L (ref 38–126)
Anion gap: 10 (ref 5–15)
BUN: 32 mg/dL — ABNORMAL HIGH (ref 8–23)
CO2: 31 mmol/L (ref 22–32)
Calcium: 9.3 mg/dL (ref 8.9–10.3)
Chloride: 99 mmol/L (ref 98–111)
Creatinine, Ser: 1.73 mg/dL — ABNORMAL HIGH (ref 0.61–1.24)
GFR, Estimated: 39 mL/min — ABNORMAL LOW (ref >60)
Glucose, Bld: 143 mg/dL — ABNORMAL HIGH (ref 70–99)
Potassium: 4.1 mmol/L (ref 3.5–5.1)
Sodium: 140 mmol/L (ref 135–145)
Total Bilirubin: 0.7 mg/dL (ref 0.0–1.2)
Total Protein: 7.2 g/dL (ref 6.5–8.1)

## 2023-11-17 LAB — BRAIN NATRIURETIC PEPTIDE: B Natriuretic Peptide: 2979.7 pg/mL — ABNORMAL HIGH (ref 0.0–100.0)

## 2023-11-17 MED ORDER — TORSEMIDE 20 MG PO TABS: 40.0000 mg | ORAL_TABLET | Freq: Every day | ORAL | 3 refills | Status: DC

## 2023-11-17 MED ORDER — SPIRONOLACTONE 25 MG PO TABS: ORAL_TABLET | ORAL | 3 refills | Status: DC

## 2023-11-17 NOTE — Progress Notes (Signed)
 ADVANCED HF CLINIC CONSULT NOTE  Referring Physician: Darryle Decent, MD Primary Care: Montey Lot, PA-C Primary Cardiologist: Gordy Bergamo, MD  Chief Complaint: Heart failure  HPI:  Mr. 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 Decent for further evaluation of his HF   Diagnosed with HF in March 2011 at [facility]. Cath at that time EF 25-35% RCT CTO otherwise ok. Had cath in 2015 with stent to LAD.   Was followed in [place] several years then transferred to [facility]   Last cath 11/22   LM: Normal LAD: Patent mid LAD stent. No significant disease Lcx: No significant disease RCA: Ostial 100% occlusion. Left-to-right collateral up to mid RCA RA 11 PA 64/22 34 PCW 23 mmHg Fick 3.6/2.0  Echo 6/24 at [facility]: LVEF < 20% RV said to be normal. G2DD. Severe biatrial enlargement. Mod-severe MR Severe TR. RVSP  Has been struggling with advanced HF symptoms with poor tolerance of GDMT due to low BP.  Last Scr 1.4 -> 1.2 -> 1.8   Seen last month and farxiga  added. No improvement. Remains volume overloaded. EKG with QRS 178 ms.   Here with adopted daughter Tully who used to work for [employer]. Says he is getting old.Not very active. Sits in his chair most of the day. Mild SOB with ADLs but says he takes his time. Using nebulizer more. No orthopnea or PND. Having a lot of edema. Still taking lasix  40 daily and torsemide  20. No CP. Still smokes ~1/2 ppd. Doesn't check sugars.       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     amiodarone  (PACERONE ) 200 MG tablet TAKE 1/2 TABLET EVERY DAY 45 tablet 3   apixaban  (ELIQUIS ) 5 MG TABS tablet TAKE 1 TABLET (5 MG TOTAL) BY MOUTH 2 (TWO) TIMES DAILY. 180 tablet 1   atorvastatin  (LIPITOR ) 80 MG tablet TAKE 1 TABLET (80 MG TOTAL) BY MOUTH AT BEDTIME. 90 tablet 3   dapagliflozin  propanediol (FARXIGA ) 10 MG TABS tablet Take 1 tablet (10 mg total) by mouth daily before breakfast. 90 tablet 1   furosemide  (LASIX ) 40 MG tablet Take 40 mg by mouth daily.     magnesium  oxide (MAG-OX) 400 (240 Mg) MG tablet TAKE 1 TABLET BY MOUTH TWICE A DAY 180 tablet 1   metFORMIN  (GLUCOPHAGE ) 500 MG tablet Take 500 mg by mouth 2 (two) times daily.     metoprolol  succinate (TOPROL -XL) 25 MG 24 hr tablet TAKE 0.5 TABLET (12.5 MG  TOTAL) BY MOUTH DAILY. 45 tablet 3   ondansetron  (ZOFRAN -ODT) 4 MG disintegrating tablet Take 4 mg by mouth every 8 (eight) hours as needed.     spironolactone  (ALDACTONE ) 25 MG tablet TAKE 1 TABLET BY MOUTH EVERY DAY IN THE MORNING 90 tablet 3   SYMBICORT 80-4.5 MCG/ACT inhaler Inhale 2 puffs into the lungs 2 (two) times daily.     torsemide  (DEMADEX ) 20 MG tablet Take 1 tablet (20 mg) twice daily for 3 days (40 mg total), THEN 20 mg daily (20 mg total) 90 tablet 3   TRUEplus Lancets 33G MISC      No current facility-administered medications for this encounter.     No Known Allergies    Social History   Socioeconomic History   Marital status: Married    Spouse name: Not on file   Number of children: 2   Years of education: Not on file   Highest education level: Not on file  Occupational History   Not on file  Tobacco Use   Smoking status: Some Days    Current packs/day: 0.50    Average packs/day: 0.5 packs/day for 60.0 years (30.0 ttl pk-yrs)    Types: Cigarettes   Smokeless tobacco: Never  Vaping Use   Vaping status: Never Used  Substance and Sexual Activity   Alcohol use: Not Currently   Drug use: Not Currently   Sexual activity: Not Currently  Other Topics Concern   Not on file  Social History Narrative   Not on file   Social Drivers of Health   Financial Resource Strain: Low Risk  (05/07/2021)   Overall Financial Resource Strain (CARDIA)    Difficulty of Paying Living Expenses: Not hard at all  Food Insecurity: No Food Insecurity (05/07/2021)   Hunger Vital Sign    Worried About Running Out of Food in the Last Year: Never true    Ran Out of Food in the Last Year: Never true  Transportation Needs: No Transportation Needs (05/07/2021)   PRAPARE - Administrator, Civil Service (Medical): No    Lack of Transportation (Non-Medical): No  Physical Activity: Inactive (05/07/2021)   Exercise Vital Sign    Days of Exercise per Week: 0 days    Minutes of Exercise per Session: 0 min  Stress: No Stress Concern Present (05/07/2021)   [employer] of Occupational Health - Occupational Stress Questionnaire    Feeling of Stress : Not at all  Social Connections: Socially Isolated (05/07/2021)   Social Connection and Isolation Panel [NHANES]    Frequency of Communication with Friends and Family: More than three times a week    Frequency of Social Gatherings with Friends and Family: More than three times a week    Attends Religious Services: Never    Database Administrator or Organizations: No    Attends Tax Inspector Meetings: Never    Marital Status: Widowed  Intimate Partner Violence: Not At Risk (05/07/2021)   Humiliation, Afraid, Rape, and Kick questionnaire    Fear of Current or Ex-Partner: No    Emotionally Abused: No    Physically Abused: No    Sexually Abused: No      Family History  Problem Relation Age of Onset   Breast cancer Mother    Diabetes Mellitus II Father    Ovarian cancer Sister    Cancer Brother    Cancer Brother    Cancer Maternal Uncle    Breast cancer Niece  Vitals:   11/17/23 1512  BP: 102/60  Pulse: 73  SpO2: 98%  Weight: 72.4 kg (159 lb 9.6 oz)    PHYSICAL EXAM: General:  Elderly frail  No respiratory difficulty HEENT: normal + temporal wasting Neck: supple. JVP to jaw  With prominent v-waves Carotids 2+ bilat; no bruits. No lymphadenopathy or thryomegaly appreciated. Cor: PMI nondisplaced. Regular rate & rhythm. No rubs, gallops or murmurs. Lungs: decreased throughout  Abdomen: soft, nontender, + distended. No hepatosplenomegaly. No bruits or masses. Good bowel sounds. Extremities: no cyanosis, + tobacco stained nails, rash, 3+ edema + thenar wasting  Neuro: alert & oriented x 3, cranial nerves grossly intact. moves all 4 extremities w/o difficulty. Affect pleasant.  ECG: Sinus 74 1AVB LBBB ( ) Personally reviewed   ASSESSMENT & PLAN:  1. 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 Had cath in 2015 with stent to LAD.  - Echo 6/24 at [facility]: LVEF < 20% RV said to be normal. G2DD. Severe biatrial enlargement. Mod-severe MR Severe TR. RVSP - Cath 11/22 LAD stent patent. RCA CTO with L-R collats RA 11 PA 64/22 34 PCW 23 mmHg Fick 3.6/2.0 - s/p ICD - Likely end-stage HF and lung disease with NYHA IIIB symptoms - Volume status elevated Recently switched from lasix  to torsemide . Increase torsemide  to 40mg  daily - Continue Farxiga  10mg  daily -  Continue Aldactone  25mg  daily - Continue Metoprolol  succinate 12.5mg  daily. - Long talk about situation. Likely with end-stage HF and COPD. Suspect component of LBBB CM and we discussed potential of CRT but may be too late to benefit much. He is not interested   2. Paroxysmal Atrial Fibrillation - Maintaining sinus rhythm on Amiodarone  200mg  daily. On Eliquis  5mg  BID. -Continue current regimen.  3. Coronary Artery Disease - Last cath 11/22 as above with stable CAD - No s/s angina - continue ASA/statin     4. LBBB - longstanding - not interested in CRT  5. CKD 3b - check labs today  6. Severe COPD with ongoing tobacco use  - discussed need fot smoking cessation as able   Toribio Fuel, MD  3:33 PM

## 2023-11-17 NOTE — Patient Instructions (Signed)
 Medication Changes:  STOP Furosemide   INCREASE Torsemide  to 40 mg (2 tabs) Daily  Lab Work:  Labs done today, your results will be available in MyChart, we will contact you for abnormal readings.  Special Instructions // Education:  Do the following things EVERYDAY: Weigh yourself in the morning before breakfast. Write it down and keep it in a log. Take your medicines as prescribed Eat low salt foods--Limit salt (sodium) to 2000 mg per day.  Stay as active as you can everyday Limit all fluids for the day to less than 2 liters   Follow-Up in: 2 weeks   At the [facility], you and your health needs are our priority. We have a designated team specialized in the treatment of Heart Failure. This Care Team includes your primary Heart Failure Specialized Cardiologist (physician), Advanced Practice Providers (APPs- Physician Assistants and Nurse Practitioners), and Pharmacist who all work together to provide you with the care you need, when you need it.   You may see any of the following providers on your designated Care Team at your next follow up:  Dr. Toribio Fuel Dr. Ezra Shuck Dr. Ria Commander Dr. Odis Brownie Greig Mosses, NP Caffie Shed, [place] [facility] [place], [place] Beckey Coe, NP Jordan Lee, NP Tinnie Redman, PharmD   Please be sure to bring in all your medications bottles to every appointment.   Need to Contact Us :  If you have any questions or concerns before your next appointment please send us  a message through [place] or call our office at [phone].    TO LEAVE A MESSAGE FOR THE NURSE SELECT OPTION 2, PLEASE LEAVE A MESSAGE INCLUDING: YOUR NAME DATE OF BIRTH CALL BACK NUMBER REASON FOR CALL**this is important as we prioritize the call backs  YOU WILL RECEIVE A CALL BACK THE SAME DAY AS LONG AS YOU CALL BEFORE 4:00 PM

## 2023-11-27 NOTE — Progress Notes (Signed)
ADVANCED HF CLINIC NOTE  Primary Care: Lonie Peak, PA-C Primary Cardiologist: Yates Decamp, MD  Chief Complaint: Heart failure follow up  HPI: Mr. Kevin [name] is an 83 y.o. 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 Flora Lipps for further evaluation of his HF   Diagnosed with HF 01/2010 at [facility]. Cath at that time showed EF 25-35%, RCT CTO otherwise ok. Had cath in 2015 with stent to LAD.   Was followed in [place] for several years then transferred to [facility].   Cath 11/22: LM: Normal LAD: Patent mid LAD stent. No significant disease Lcx: No significant disease RCA: Ostial 100% occlusion. Left-to-right collateral up to mid RCA RA 11 PA 64/22 34 PCW 23 mmHg Fick 3.6/2.0  Echo 6/24 at [place]: EF < 20% RV said to be normal, G2DD, severe biatrial enlargement, mod-severe MR, severe TR. RVSP  Follow up 11/17/23 with Dr. Gala Romney, struggling with advanced HF symptoms and poor tolerance of GDMT. SCr climbing. Volume up with NYHA IIIb symptoms. Felt to be end-stage HF. Torsemide increased to 40 mg daily.   Today he returns for HF follow up with his daughter. Overall feeling fine. Breathing "about the same". No SOB walking with his walker on flat ground, has dyspnea walking up steps. He gets tired walking to mailbox, but no SOB. Legs are swelling. Denies palpitations, CP, dizziness, abnormal bleeding, or PND/Orthopnea. Appetite ok. No fever or chills. Weight at home 152 pounds. Taking all medications. Smoking 1/2 ppd. Lives by himself, daughters live close-by and help with meds     Past Medical History:  Diagnosis Date   Anemia 05/20/2022   Cancer (HCC)    Chronic systolic heart failure (HCC) 04/21/2018   COPD (chronic obstructive pulmonary disease) (HCC)    Diabetes mellitus without complication (HCC)    Encounter for  assessment of implantable cardioverter-defibrillator (ICD) 02/01/2023   History of myocardial infarction    Hyperlipidemia    Hypertension    ICD: Saint Jude/Abbott dual-chamber defibrillator 05/18/2014 02/01/2023   Iron deficiency anemia 05/21/2022   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)    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     amiodarone (PACERONE) 200 MG tablet TAKE 1/2 TABLET EVERY DAY 45 tablet 3   apixaban (ELIQUIS) 5 MG TABS tablet TAKE 1 TABLET (5 MG TOTAL) BY MOUTH 2 (TWO) TIMES DAILY. 180 tablet 1   atorvastatin (LIPITOR) 80 MG tablet TAKE 1 TABLET (80 MG TOTAL) BY MOUTH AT BEDTIME. 90 tablet 3   dapagliflozin propanediol (FARXIGA) 10 MG TABS tablet Take 1 tablet (10 mg total) by mouth daily before breakfast. 90 tablet 1   magnesium oxide (MAG-OX) 400 (240 Mg) MG tablet TAKE 1 TABLET BY MOUTH TWICE A DAY 180 tablet 1   metFORMIN (GLUCOPHAGE) 500 MG tablet Take 500 mg by mouth 2 (two) times daily.     metoprolol succinate (TOPROL-XL) 25 MG 24 hr tablet TAKE 0.5 TABLET (12.5 MG TOTAL) BY MOUTH DAILY. 45 tablet 3   ondansetron (ZOFRAN-ODT) 4 MG disintegrating tablet Take 4 mg by mouth  every 8 (eight) hours as needed.     spironolactone (ALDACTONE) 25 MG tablet TAKE 1 TABLET BY MOUTH EVERY DAY IN THE MORNING 90 tablet 3   SYMBICORT 80-4.5 MCG/ACT inhaler Inhale 2 puffs into the lungs 2 (two) times daily.     torsemide (DEMADEX) 20 MG tablet Take 2 tablets (40 mg total) by mouth daily. 60 tablet 3   TRUEplus Lancets 33G MISC      No current facility-administered medications for this encounter.   No Known Allergies  Social History   Socioeconomic History   Marital status:  Married    Spouse name: Not on file   Number of children: 2   Years of education: Not on file   Highest education level: Not on file  Occupational History   Not on file  Tobacco Use   Smoking status: Some Days    Current packs/day: 0.50    Average packs/day: 0.5 packs/day for 60.0 years (30.0 ttl pk-yrs)    Types: Cigarettes   Smokeless tobacco: Never  Vaping Use   Vaping status: Never Used  Substance and Sexual Activity   Alcohol use: Not Currently   Drug use: Not Currently   Sexual activity: Not Currently  Other Topics Concern   Not on file  Social History Narrative   Not on file   Social Drivers of Health   Financial Resource Strain: Low Risk  (05/07/2021)   Overall Financial Resource Strain (CARDIA)    Difficulty of Paying Living Expenses: Not hard at all  Food Insecurity: No Food Insecurity (05/07/2021)   Hunger Vital Sign    Worried About Running Out of Food in the Last Year: Never true    Ran Out of Food in the Last Year: Never true  Transportation Needs: No Transportation Needs (05/07/2021)   PRAPARE - Administrator, Civil Service (Medical): No    Lack of Transportation (Non-Medical): No  Physical Activity: Inactive (05/07/2021)   Exercise Vital Sign    Days of Exercise per Week: 0 days    Minutes of Exercise per Session: 0 min  Stress: No Stress Concern Present (05/07/2021)   [employer] of Occupational Health - Occupational Stress Questionnaire    Feeling of Stress : Not at all  Social Connections: Socially Isolated (05/07/2021)   Social Connection and Isolation Panel [NHANES]    Frequency of Communication with Friends and Family: More than three times a week    Frequency of Social Gatherings with Friends and Family: More than three times a week    Attends Religious Services: Never    Database administrator or Organizations: No    Attends Banker Meetings: Never    Marital Status: Widowed  Intimate Partner Violence: Not At Risk  (05/07/2021)   Humiliation, Afraid, Rape, and Kick questionnaire    Fear of Current or Ex-Partner: No    Emotionally Abused: No    Physically Abused: No    Sexually Abused: No   Family History  Problem Relation Age of Onset   Breast cancer Mother    Diabetes Mellitus II Father    Ovarian cancer Sister    Cancer Brother    Cancer Brother    Cancer Maternal Uncle    Breast cancer Niece    Wt Readings from Last 3 Encounters:  11/30/23 70.7 kg (155 lb 12.8 oz)  11/17/23 72.4 kg (159 lb 9.6 oz)  11/13/23 74.8 kg (165 lb)   BP 124/70   Pulse 67  Ht 5\' 6"  (1.676 m)   Wt 70.7 kg (155 lb 12.8 oz)   SpO2 98%   BMI 25.15 kg/m   PHYSICAL EXAM: General:  No resp difficulty, elderly, cachectic HEENT: + temporal wasting Neck: Supple. +v waves,. Carotids 2+ bilat; no bruits. No lymphadenopathy or thryomegaly appreciated. Cor: PMI nondisplaced. Regular rate & rhythm. No rubs, gallops or murmurs. Lungs: Diminished throughout, + rhonchi Abdomen: Soft, nontender, nondistended. No hepatosplenomegaly. No bruits or masses. Good bowel sounds. Extremities: No cyanosis, clubbing, rash, + ankle edema; + thenar wasting. Neuro: Alert & oriented x 3, cranial nerves grossly intact. Moves all 4 extremities w/o difficulty. Affect pleasant.  Device interrogation (personally reviewed): CorVue stable, no VT, 1.5% VP  ASSESSMENT & PLAN:  1. 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 Had cath in 2015 with stent to LAD.  - Echo 6/24 at [facility]: LVEF < 20% RV said to be normal. G2DD. Severe biatrial enlargement. Mod-severe MR Severe TR. RVSP - Cath 11/22 LAD stent patent. RCA CTO with L-R collats RA 11 PA 64/22 34 PCW 23 mmHg Fick 3.6/2.0 - s/p ICD - NYHA IIIb symptoms, volume ok today on exam and CorVue. - End-stage HF and lung disease - Continue torsemide 40 mg daily. - Continue Farxiga 10mg  daily. - Continue  spironolactone 25 mg daily. - Continue Toprol XL 12.5 mg daily. - Dr. Gala Romney had long talk a bout his situation at previous visit. Suspect a component of LBBB CM, but likely too late to benefit from CRT. - Labs today. - Consider Furoscix for home palliative use. We discussed this today, his daughters could help administer. - He has St Jude ICD, refer to EP for device monitoring.  2. Paroxysmal Atrial Fibrillation - Regular on exam today. - Continue amiodarone 200 mg daily.  - Continue Eliquis 5 mg bid. No bleeding issues. - Continue current regimen. - CBC today.  3. CAD - Last cath 11/22 as above with stable CAD - No chest pain - Continue statin - No ASA with Eliquis     4. LBBB - Longstanding - Not interested in CRT  5. CKD 3b - Baseline SCr 1.7-1.8 - Continue SGLT2i. - Labs today.  6. Severe COPD with ongoing tobacco use  - Smoking 1/2 ppd. - Discussed need for smoking cessation as able  7. GOC - End-stage HF and lung disease. - Consider GOC conversations next visit  Follow up in 8 weeks with APP.   Jacklynn Ganong, FNP  1:47 PM

## 2023-11-30 ENCOUNTER — Encounter (HOSPITAL_COMMUNITY): Payer: Self-pay

## 2023-11-30 ENCOUNTER — Ambulatory Visit (HOSPITAL_COMMUNITY)
Admission: RE | Admit: 2023-11-30 | Discharge: 2023-11-30 | Disposition: A | Discharge status: Home / Self Care | Payer: Medicare PPO | Source: Ambulatory Visit | Attending: Family Medicine | Admitting: Family Medicine

## 2023-11-30 VITALS — BP 124/70 | HR 67 | Ht 66.0 in | Wt 155.8 lb

## 2023-11-30 DIAGNOSIS — I5022 Chronic systolic (congestive) heart failure: Secondary | ICD-10-CM | POA: Diagnosis not present

## 2023-11-30 DIAGNOSIS — Z955 Presence of coronary angioplasty implant and graft: Secondary | ICD-10-CM | POA: Insufficient documentation

## 2023-11-30 DIAGNOSIS — N1832 Chronic kidney disease, stage 3b: Secondary | ICD-10-CM | POA: Insufficient documentation

## 2023-11-30 DIAGNOSIS — I272 Pulmonary hypertension, unspecified: Secondary | ICD-10-CM | POA: Insufficient documentation

## 2023-11-30 DIAGNOSIS — J449 Chronic obstructive pulmonary disease, unspecified: Secondary | ICD-10-CM | POA: Insufficient documentation

## 2023-11-30 DIAGNOSIS — E1122 Type 2 diabetes mellitus with diabetic chronic kidney disease: Secondary | ICD-10-CM | POA: Diagnosis not present

## 2023-11-30 DIAGNOSIS — N183 Chronic kidney disease, stage 3 unspecified: Secondary | ICD-10-CM | POA: Diagnosis not present

## 2023-11-30 DIAGNOSIS — I130 Hypertensive heart and chronic kidney disease with heart failure and stage 1 through stage 4 chronic kidney disease, or unspecified chronic kidney disease: Secondary | ICD-10-CM | POA: Insufficient documentation

## 2023-11-30 DIAGNOSIS — Z95810 Presence of automatic (implantable) cardiac defibrillator: Secondary | ICD-10-CM | POA: Diagnosis not present

## 2023-11-30 DIAGNOSIS — I081 Rheumatic disorders of both mitral and tricuspid valves: Secondary | ICD-10-CM | POA: Insufficient documentation

## 2023-11-30 DIAGNOSIS — I428 Other cardiomyopathies: Secondary | ICD-10-CM | POA: Insufficient documentation

## 2023-11-30 DIAGNOSIS — I447 Left bundle-branch block, unspecified: Secondary | ICD-10-CM | POA: Diagnosis not present

## 2023-11-30 DIAGNOSIS — I2510 Atherosclerotic heart disease of native coronary artery without angina pectoris: Secondary | ICD-10-CM | POA: Diagnosis not present

## 2023-11-30 DIAGNOSIS — F17210 Nicotine dependence, cigarettes, uncomplicated: Secondary | ICD-10-CM | POA: Insufficient documentation

## 2023-11-30 DIAGNOSIS — Z720 Tobacco use: Secondary | ICD-10-CM

## 2023-11-30 DIAGNOSIS — I255 Ischemic cardiomyopathy: Secondary | ICD-10-CM | POA: Insufficient documentation

## 2023-11-30 DIAGNOSIS — I480 Paroxysmal atrial fibrillation: Secondary | ICD-10-CM | POA: Insufficient documentation

## 2023-11-30 LAB — CBC
HCT: 35.7 % — ABNORMAL LOW (ref 39.0–52.0)
Hemoglobin: 10.8 g/dL — ABNORMAL LOW (ref 13.0–17.0)
MCH: 28.8 pg (ref 26.0–34.0)
MCHC: 30.3 g/dL (ref 30.0–36.0)
MCV: 95.2 fL (ref 80.0–100.0)
Platelets: 139 10*3/uL — ABNORMAL LOW (ref 150–400)
RBC: 3.75 MIL/uL — ABNORMAL LOW (ref 4.22–5.81)
RDW: 15.9 % — ABNORMAL HIGH (ref 11.5–15.5)
WBC: 3.8 10*3/uL — ABNORMAL LOW (ref 4.0–10.5)
nRBC: 0 % (ref 0.0–0.2)

## 2023-11-30 LAB — BASIC METABOLIC PANEL
Anion gap: 11 (ref 5–15)
BUN: 40 mg/dL — ABNORMAL HIGH (ref 8–23)
CO2: 29 mmol/L (ref 22–32)
Calcium: 9.5 mg/dL (ref 8.9–10.3)
Chloride: 99 mmol/L (ref 98–111)
Creatinine, Ser: 1.93 mg/dL — ABNORMAL HIGH (ref 0.61–1.24)
GFR, Estimated: 34 mL/min — ABNORMAL LOW (ref >60)
Glucose, Bld: 138 mg/dL — ABNORMAL HIGH (ref 70–99)
Potassium: 4.4 mmol/L (ref 3.5–5.1)
Sodium: 139 mmol/L (ref 135–145)

## 2023-11-30 LAB — BRAIN NATRIURETIC PEPTIDE: B Natriuretic Peptide: 2805.7 pg/mL — ABNORMAL HIGH (ref 0.0–100.0)

## 2023-11-30 NOTE — Patient Instructions (Signed)
No change in medications. Labs today - will call you if abnormal. Referral sent to Cardiology asking for ICD managment - see below. Return to Heart Failure APP Clinic in 8 weeks - see below. Please call us at [phone] if any questions or concerns prior to your next visit.

## 2023-11-30 NOTE — Addendum Note (Signed)
Encounter addended by: Levonne Spiller, RN on: 11/30/2023 2:20 PM  Actions taken: Order list changed, Diagnosis association updated

## 2023-12-01 ENCOUNTER — Telehealth (HOSPITAL_COMMUNITY): Payer: Self-pay

## 2023-12-01 DIAGNOSIS — I5022 Chronic systolic (congestive) heart failure: Secondary | ICD-10-CM

## 2023-12-01 MED ORDER — METOLAZONE 2.5 MG PO TABS
2.5000 mg | ORAL_TABLET | ORAL | 0 refills | Status: AC | PRN
Start: 2023-12-01 — End: 2025-05-06

## 2023-12-01 MED ORDER — POTASSIUM CHLORIDE CRYS ER 20 MEQ PO TBCR
20.0000 meq | EXTENDED_RELEASE_TABLET | ORAL | 3 refills | Status: DC | PRN
Start: 2023-12-01 — End: 2024-01-25

## 2023-12-01 NOTE — Telephone Encounter (Addendum)
Spoke with daughter Wilkie Aye, she is aware of med changes and will get labs done in [place] next week.  ----- Message from Jacklynn Ganong sent at 11/30/2023  4:53 PM EST ----- BNP remains elevated, renal function at baseline.  Give metolazone 2.5 mg + extra 40 KCL today and tomorrow (take with torsemide).  Repeat BMET in 1 week

## 2023-12-02 DIAGNOSIS — I5040 Unspecified combined systolic (congestive) and diastolic (congestive) heart failure: Secondary | ICD-10-CM | POA: Diagnosis not present

## 2023-12-02 DIAGNOSIS — J9611 Chronic respiratory failure with hypoxia: Secondary | ICD-10-CM | POA: Diagnosis not present

## 2023-12-02 DIAGNOSIS — J449 Chronic obstructive pulmonary disease, unspecified: Secondary | ICD-10-CM | POA: Diagnosis not present

## 2023-12-09 ENCOUNTER — Other Ambulatory Visit (HOSPITAL_COMMUNITY): Payer: Self-pay | Admitting: Cardiology

## 2023-12-09 ENCOUNTER — Other Ambulatory Visit (HOSPITAL_COMMUNITY): Payer: Self-pay | Admitting: Family Medicine

## 2023-12-09 DIAGNOSIS — I5022 Chronic systolic (congestive) heart failure: Secondary | ICD-10-CM

## 2023-12-10 LAB — BASIC METABOLIC PANEL
BUN/Creatinine Ratio: 22 (ref 10–24)
BUN: 49 mg/dL — ABNORMAL HIGH (ref 8–27)
CO2: 25 mmol/L (ref 20–29)
Calcium: 9.6 mg/dL (ref 8.6–10.2)
Chloride: 101 mmol/L (ref 96–106)
Creatinine, Ser: 2.25 mg/dL — ABNORMAL HIGH (ref 0.76–1.27)
Glucose: 206 mg/dL — ABNORMAL HIGH (ref 70–99)
Potassium: 5.4 mmol/L — ABNORMAL HIGH (ref 3.5–5.2)
Sodium: 141 mmol/L (ref 134–144)
eGFR: 28 mL/min/{1.73_m2} — ABNORMAL LOW (ref >59)

## 2023-12-11 ENCOUNTER — Other Ambulatory Visit (HOSPITAL_COMMUNITY): Payer: Self-pay | Admitting: Cardiology

## 2023-12-11 ENCOUNTER — Telehealth (HOSPITAL_COMMUNITY): Payer: Self-pay

## 2023-12-11 DIAGNOSIS — I5022 Chronic systolic (congestive) heart failure: Secondary | ICD-10-CM

## 2023-12-11 MED ORDER — SPIRONOLACTONE 25 MG PO TABS: 12.5000 mg | ORAL_TABLET | Freq: Every day | ORAL | 11 refills | Status: DC

## 2023-12-11 NOTE — Telephone Encounter (Signed)
-----   Message from Anderson [place] sent at 12/11/2023 12:43 PM EST ----- K elevated and renal function lower.  Stop all KCL supplements. Hold spiro x 3 days, then resume at lower dose of 12.5 mg daily. Repeat BMET next Wednesday

## 2023-12-11 NOTE — Telephone Encounter (Signed)
Attempted to call patient on all numbers in chart- unable to reach or leave message.   Spoke with patients daughter Burnett Harry (okay per DPR) though she states she does not do patients medications. She advised that she would have her sister call back to the office to discuss.

## 2023-12-16 ENCOUNTER — Ambulatory Visit: Payer: Medicare PPO

## 2023-12-16 DIAGNOSIS — I447 Left bundle-branch block, unspecified: Secondary | ICD-10-CM

## 2023-12-22 ENCOUNTER — Telehealth: Payer: Self-pay

## 2023-12-22 LAB — CUP PACEART REMOTE DEVICE CHECK
Battery Remaining Longevity: 7 mo
Battery Remaining Percentage: 7 %
Battery Voltage: 2.65 V
Brady Statistic AP VP Percent: 1 %
Brady Statistic AP VS Percent: 1 %
Brady Statistic AS VP Percent: 1.4 %
Brady Statistic AS VS Percent: 97 %
Brady Statistic RA Percent Paced: 1 %
Brady Statistic RV Percent Paced: 1.8 %
Date Time Interrogation Session: 20250205052601
HighPow Impedance: 63 Ohm
HighPow Impedance: 63 Ohm
Lead Channel Impedance Value: 310 Ohm
Lead Channel Impedance Value: 360 Ohm
Lead Channel Pacing Threshold Amplitude: 1 V
Lead Channel Pacing Threshold Amplitude: 1 V
Lead Channel Pacing Threshold Pulse Width: 0.5 ms
Lead Channel Pacing Threshold Pulse Width: 0.5 ms
Lead Channel Sensing Intrinsic Amplitude: 1.7 mV
Lead Channel Sensing Intrinsic Amplitude: 12 mV
Lead Channel Setting Pacing Amplitude: 1.25 V
Lead Channel Setting Pacing Amplitude: 2 V
Lead Channel Setting Pacing Pulse Width: 0.5 ms
Lead Channel Setting Sensing Sensitivity: 0.5 mV
Pulse Gen Serial Number: 7211894

## 2023-12-22 NOTE — Telephone Encounter (Signed)
Scheduled remote reviewed. Normal device function.   The device estimates 6.5 months until ERI, start IFU, sent to triage  Patient has not been officially assigned a provider and finished entering in [place].  Forwarding to CMA team to assist.

## 2023-12-23 NOTE — Telephone Encounter (Signed)
Done

## 2023-12-24 ENCOUNTER — Other Ambulatory Visit (HOSPITAL_COMMUNITY): Payer: Self-pay | Admitting: Family Medicine

## 2023-12-25 DIAGNOSIS — I5022 Chronic systolic (congestive) heart failure: Secondary | ICD-10-CM | POA: Diagnosis not present

## 2023-12-26 LAB — BASIC METABOLIC PANEL
BUN/Creatinine Ratio: 26 — ABNORMAL HIGH (ref 10–24)
BUN: 52 mg/dL — ABNORMAL HIGH (ref 8–27)
CO2: 30 mmol/L — ABNORMAL HIGH (ref 20–29)
Calcium: 10.3 mg/dL — ABNORMAL HIGH (ref 8.6–10.2)
Chloride: 95 mmol/L — ABNORMAL LOW (ref 96–106)
Creatinine, Ser: 2.02 mg/dL — ABNORMAL HIGH (ref 0.76–1.27)
Glucose: 163 mg/dL — ABNORMAL HIGH (ref 70–99)
Potassium: 4.8 mmol/L (ref 3.5–5.2)
Sodium: 141 mmol/L (ref 134–144)
eGFR: 32 mL/min/{1.73_m2} — ABNORMAL LOW (ref >59)

## 2023-12-30 ENCOUNTER — Ambulatory Visit: Payer: Medicare PPO | Admitting: Internal Medicine

## 2024-01-02 DIAGNOSIS — J449 Chronic obstructive pulmonary disease, unspecified: Secondary | ICD-10-CM | POA: Diagnosis not present

## 2024-01-02 DIAGNOSIS — J9611 Chronic respiratory failure with hypoxia: Secondary | ICD-10-CM | POA: Diagnosis not present

## 2024-01-02 DIAGNOSIS — I5040 Unspecified combined systolic (congestive) and diastolic (congestive) heart failure: Secondary | ICD-10-CM | POA: Diagnosis not present

## 2024-01-20 ENCOUNTER — Other Ambulatory Visit: Payer: Self-pay | Admitting: Cardiology

## 2024-01-22 NOTE — Progress Notes (Signed)
 Remote ICD transmission.

## 2024-01-22 NOTE — Addendum Note (Signed)
 Addended by: Elease Etienne A on: 01/22/2024 01:33 PM   Modules accepted: Orders

## 2024-01-25 ENCOUNTER — Encounter: Payer: Self-pay | Admitting: Hematology and Oncology

## 2024-01-25 ENCOUNTER — Encounter: Payer: Self-pay | Admitting: Internal Medicine

## 2024-01-25 ENCOUNTER — Ambulatory Visit (INDEPENDENT_AMBULATORY_CARE_PROVIDER_SITE_OTHER): Payer: Medicare PPO | Admitting: Internal Medicine

## 2024-01-25 ENCOUNTER — Encounter (HOSPITAL_COMMUNITY): Payer: Self-pay

## 2024-01-25 ENCOUNTER — Ambulatory Visit
Admission: RE | Admit: 2024-01-25 | Discharge: 2024-01-25 | Disposition: A | Discharge status: Home / Self Care | Source: Ambulatory Visit | Attending: Internal Medicine | Admitting: Internal Medicine

## 2024-01-25 ENCOUNTER — Inpatient Hospital Stay (HOSPITAL_COMMUNITY)
Admission: RE | Admit: 2024-01-25 | Discharge: 2024-01-25 | Disposition: A | Discharge status: Home / Self Care | Payer: Medicare PPO | Source: Ambulatory Visit

## 2024-01-25 VITALS — BP 118/72 | HR 71 | Wt 174.8 lb

## 2024-01-25 VITALS — BP 106/70 | HR 74 | Ht 66.0 in | Wt 172.0 lb

## 2024-01-25 DIAGNOSIS — J449 Chronic obstructive pulmonary disease, unspecified: Secondary | ICD-10-CM | POA: Diagnosis not present

## 2024-01-25 DIAGNOSIS — N183 Chronic kidney disease, stage 3 unspecified: Secondary | ICD-10-CM | POA: Diagnosis not present

## 2024-01-25 DIAGNOSIS — I447 Left bundle-branch block, unspecified: Secondary | ICD-10-CM | POA: Diagnosis not present

## 2024-01-25 DIAGNOSIS — N1832 Chronic kidney disease, stage 3b: Secondary | ICD-10-CM | POA: Insufficient documentation

## 2024-01-25 DIAGNOSIS — I255 Ischemic cardiomyopathy: Secondary | ICD-10-CM | POA: Diagnosis not present

## 2024-01-25 DIAGNOSIS — F17210 Nicotine dependence, cigarettes, uncomplicated: Secondary | ICD-10-CM | POA: Diagnosis not present

## 2024-01-25 DIAGNOSIS — I5084 End stage heart failure: Secondary | ICD-10-CM | POA: Diagnosis not present

## 2024-01-25 DIAGNOSIS — Z7901 Long term (current) use of anticoagulants: Secondary | ICD-10-CM | POA: Diagnosis not present

## 2024-01-25 DIAGNOSIS — Z79899 Other long term (current) drug therapy: Secondary | ICD-10-CM | POA: Insufficient documentation

## 2024-01-25 DIAGNOSIS — Z95810 Presence of automatic (implantable) cardiac defibrillator: Secondary | ICD-10-CM | POA: Diagnosis not present

## 2024-01-25 DIAGNOSIS — I480 Paroxysmal atrial fibrillation: Secondary | ICD-10-CM | POA: Insufficient documentation

## 2024-01-25 DIAGNOSIS — I081 Rheumatic disorders of both mitral and tricuspid valves: Secondary | ICD-10-CM | POA: Diagnosis not present

## 2024-01-25 DIAGNOSIS — I272 Pulmonary hypertension, unspecified: Secondary | ICD-10-CM | POA: Insufficient documentation

## 2024-01-25 DIAGNOSIS — Z4502 Encounter for adjustment and management of automatic implantable cardiac defibrillator: Secondary | ICD-10-CM

## 2024-01-25 DIAGNOSIS — I2510 Atherosclerotic heart disease of native coronary artery without angina pectoris: Secondary | ICD-10-CM | POA: Insufficient documentation

## 2024-01-25 DIAGNOSIS — E119 Type 2 diabetes mellitus without complications: Secondary | ICD-10-CM | POA: Diagnosis not present

## 2024-01-25 DIAGNOSIS — Z955 Presence of coronary angioplasty implant and graft: Secondary | ICD-10-CM | POA: Diagnosis not present

## 2024-01-25 DIAGNOSIS — I428 Other cardiomyopathies: Secondary | ICD-10-CM | POA: Diagnosis not present

## 2024-01-25 DIAGNOSIS — I5022 Chronic systolic (congestive) heart failure: Secondary | ICD-10-CM

## 2024-01-25 DIAGNOSIS — I130 Hypertensive heart and chronic kidney disease with heart failure and stage 1 through stage 4 chronic kidney disease, or unspecified chronic kidney disease: Secondary | ICD-10-CM | POA: Insufficient documentation

## 2024-01-25 DIAGNOSIS — I5023 Acute on chronic systolic (congestive) heart failure: Secondary | ICD-10-CM | POA: Insufficient documentation

## 2024-01-25 LAB — COMPREHENSIVE METABOLIC PANEL
ALT: 14 U/L (ref 0–44)
AST: 16 U/L (ref 15–41)
Albumin: 3.5 g/dL (ref 3.5–5.0)
Alkaline Phosphatase: 92 U/L (ref 38–126)
Anion gap: 8 (ref 5–15)
BUN: 18 mg/dL (ref 8–23)
CO2: 22 mmol/L (ref 22–32)
Calcium: 8.8 mg/dL — ABNORMAL LOW (ref 8.9–10.3)
Chloride: 108 mmol/L (ref 98–111)
Creatinine, Ser: 1.65 mg/dL — ABNORMAL HIGH (ref 0.61–1.24)
GFR, Estimated: 41 mL/min — ABNORMAL LOW (ref >60)
Glucose, Bld: 108 mg/dL — ABNORMAL HIGH (ref 70–99)
Potassium: 5 mmol/L (ref 3.5–5.1)
Sodium: 138 mmol/L (ref 135–145)
Total Bilirubin: 0.9 mg/dL (ref 0.0–1.2)
Total Protein: 6.9 g/dL (ref 6.5–8.1)

## 2024-01-25 LAB — CUP PACEART INCLINIC DEVICE CHECK
Battery Remaining Longevity: 7 mo
Brady Statistic RA Percent Paced: 1.1 %
Brady Statistic RV Percent Paced: 2.2 %
Date Time Interrogation Session: 20250317173130
HighPow Impedance: 47.25 Ohm
Implantable Lead Connection Status: 753985
Implantable Lead Connection Status: 753985
Implantable Lead Implant Date: 20150709
Implantable Lead Implant Date: 20150709
Implantable Lead Location: 753859
Implantable Lead Location: 753860
Implantable Pulse Generator Implant Date: 20150709
Lead Channel Impedance Value: 275 Ohm
Lead Channel Impedance Value: 312.5 Ohm
Lead Channel Pacing Threshold Amplitude: 1 V
Lead Channel Pacing Threshold Amplitude: 1 V
Lead Channel Pacing Threshold Amplitude: 1 V
Lead Channel Pacing Threshold Amplitude: 1 V
Lead Channel Pacing Threshold Amplitude: 1.25 V
Lead Channel Pacing Threshold Pulse Width: 0.5 ms
Lead Channel Pacing Threshold Pulse Width: 0.5 ms
Lead Channel Pacing Threshold Pulse Width: 0.8 ms
Lead Channel Pacing Threshold Pulse Width: 0.8 ms
Lead Channel Pacing Threshold Pulse Width: 0.8 ms
Lead Channel Sensing Intrinsic Amplitude: 1.2 mV
Lead Channel Sensing Intrinsic Amplitude: 8.3 mV
Lead Channel Setting Pacing Amplitude: 1.25 V
Lead Channel Setting Pacing Amplitude: 2 V
Lead Channel Setting Pacing Pulse Width: 0.5 ms
Lead Channel Setting Sensing Sensitivity: 0.5 mV
Pulse Gen Serial Number: 7211894

## 2024-01-25 LAB — BRAIN NATRIURETIC PEPTIDE: B Natriuretic Peptide: 2889.1 pg/mL — ABNORMAL HIGH (ref 0.0–100.0)

## 2024-01-25 LAB — CBC
HCT: 36.2 % — ABNORMAL LOW (ref 39.0–52.0)
Hemoglobin: 11.2 g/dL — ABNORMAL LOW (ref 13.0–17.0)
MCH: 28.9 pg (ref 26.0–34.0)
MCHC: 30.9 g/dL (ref 30.0–36.0)
MCV: 93.5 fL (ref 80.0–100.0)
Platelets: 168 10*3/uL (ref 150–400)
RBC: 3.87 MIL/uL — ABNORMAL LOW (ref 4.22–5.81)
RDW: 17.4 % — ABNORMAL HIGH (ref 11.5–15.5)
WBC: 3.9 10*3/uL — ABNORMAL LOW (ref 4.0–10.5)
nRBC: 0 % (ref 0.0–0.2)

## 2024-01-25 MED ORDER — TORSEMIDE 20 MG PO TABS: 40.0000 mg | ORAL_TABLET | Freq: Every day | ORAL | 0 refills | Status: DC

## 2024-01-25 MED ORDER — POTASSIUM CHLORIDE CRYS ER 20 MEQ PO TBCR
20.0000 meq | EXTENDED_RELEASE_TABLET | Freq: Every day | ORAL | 8 refills | Status: AC
Start: 2024-01-25 — End: ?

## 2024-01-25 MED ORDER — METOPROLOL SUCCINATE ER 25 MG PO TB24: ORAL_TABLET | ORAL | 3 refills | Status: AC

## 2024-01-25 MED ORDER — TORSEMIDE 20 MG PO TABS: 40.0000 mg | ORAL_TABLET | Freq: Every day | ORAL | 3 refills | Status: DC

## 2024-01-25 NOTE — Patient Instructions (Signed)
Medication Instructions:  Your physician recommends that you continue on your current medications as directed. Please refer to the Current Medication list given to you today.  *If you need a refill on your cardiac medications before your next appointment, please call your pharmacy*   Lab Work: None ordered.  If you have labs (blood work) drawn today and your tests are completely normal, you will receive your results only by: MyChart Message (if you have MyChart) OR A paper copy in the mail If you have any lab test that is abnormal or we need to change your treatment, we will call you to review the results.   Testing/Procedures: None ordered.    Follow-Up: At [facility], you and your health needs are our priority.  As part of our continuing mission to provide you with exceptional heart care, we have created designated Provider Care Teams.  These Care Teams include your primary Cardiologist (physician) and Advanced Practice Providers (APPs -  Physician Assistants and Nurse Practitioners) who all work together to provide you with the care you need, when you need it.  We recommend signing up for the patient portal called "MyChart".  Sign up information is provided on this After Visit Summary.  MyChart is used to connect with patients for Virtual Visits (Telemedicine).  Patients are able to view lab/test results, encounter notes, upcoming appointments, etc.  Non-urgent messages can be sent to your provider as well.   To learn more about what you can do with MyChart, go to [email or web address].    Your next appointment:   4 months with Dr Graciela Husbands

## 2024-01-25 NOTE — Patient Instructions (Addendum)
 Thank you for coming in today  If you had labs drawn today, any labs that are abnormal the clinic will call you No news is good news  Medications: Furoscix for 3 days with Potassium 40 meq for 3 days   01/29/2024 START Torsemide 40 mg daily with Potassium 20 meq daily   Your provider has order Furoscix for you. This is an on-body infuser that gives you a dose of Furosemide.   It will be shipped to your home   Furosc[employer] [facility] will call you to discuss before shipping so, PLEASE answer unknown calls  For questions regarding the device call Furosc[employer] [facility] at [phone]  Ensure you write down the time you start your infusion so that if there is a problem you will know how long the infusion lasted  Use Furoscix only AS DIRECTED by our office  Dosing Directions:   Day 1=01/26/2024  Day 2=01/27/2024  Day 3= 01/28/2024  Follow up appointments:  Your physician recommends that you schedule a follow-up appointment in:  7-10 days in clinic   Do the following things EVERYDAY: Weigh yourself in the morning before breakfast. Write it down and keep it in a log. Take your medicines as prescribed Eat low salt foods--Limit salt (sodium) to 2000 mg per day.  Stay as active as you can everyday Limit all fluids for the day to less than 2 liters   At the [facility], you and your health needs are our priority. As part of our continuing mission to provide you with exceptional heart care, we have created designated Provider Care Teams. These Care Teams include your primary Cardiologist (physician) and Advanced Practice Providers (APPs- Physician Assistants and Nurse Practitioners) who all work together to provide you with the care you need, when you need it.   You may see any of the following providers on your designated Care Team at your next follow up: Dr Arvilla Meres Dr Marca Ancona Dr. Marcos Eke, NP Robbie Lis, [place] [facility] [place], [place] Brynda Peon, NP Karle Plumber, PharmD   Please be sure to bring in all your medications bottles to every appointment.    Thank you for choosing [facility]  If you have any questions or concerns before your next appointment please send [place] a message through [place] or call our office at [phone].    TO LEAVE A MESSAGE FOR THE NURSE SELECT OPTION 2, PLEASE LEAVE A MESSAGE INCLUDING: YOUR NAME DATE OF BIRTH CALL BACK NUMBER REASON FOR CALL**this is important as we prioritize the call backs  YOU WILL RECEIVE A CALL BACK THE SAME DAY AS LONG AS YOU CALL BEFORE 4:00 PM

## 2024-01-25 NOTE — Progress Notes (Signed)
 Provided patient education on Furoscix using demo kits and Furoscix video, QR code provided on AVS for further viewing. Furoscix order submitted online, ov note and ins card uploaded to [employer].

## 2024-01-25 NOTE — Addendum Note (Signed)
 Encounter addended by: Demetrius Charity, RN on: 01/25/2024 3:15 PM  Actions taken: Pharmacy for encounter modified, Order list changed

## 2024-01-25 NOTE — Progress Notes (Signed)
 ADVANCED HF CLINIC NOTE  Primary Care: Lonie Peak, PA-C Primary Cardiologist: Yates Decamp, MD HF Cardiologist: Dr. Gala Romney  HPI: Kevin [name] is an 83 y.o. 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 Flora Lipps for further evaluation of his HF   Diagnosed with HF 01/2010 at [facility]. Cath at that time showed EF 25-35%, RCT CTO otherwise ok. Had cath in 2015 with stent to LAD.   Was followed in [place] for several years then transferred to [facility].   Cath 11/22: LM: Normal LAD: Patent mid LAD stent. No significant disease Lcx: No significant disease RCA: Ostial 100% occlusion. Left-to-right collateral up to mid RCA RA 11 PA 64/22 34 PCW 23 mmHg Fick 3.6/2.0  Echo 6/24 at [place]: EF < 20% RV said to be normal, G2DD, severe biatrial enlargement, mod-severe MR, severe TR. RVSP 73 mmHG  Follow up 11/17/23 with Dr. Gala Romney, struggling with advanced HF symptoms and poor tolerance of GDMT. SCr climbing. Volume up with NYHA IIIb symptoms. Felt to be end-stage HF. Torsemide increased to 40 mg daily.   Today he returns for HF follow up with daughter. Ran out of torsemide x 1 month. Overall feeling fine. Has noticed fluid going back up. Feet and legs are swelling. He has SOB walking on flat ground, does not move around much. Denies palpitations, abnormal bleeding, CP, dizziness, or PND. Sleeps on 3 pillows.  Appetite ok. No fever or chills. Does not remember home weights. Saw EP Dr. Graciela Husbands today, weighing pros and cons of gen change with battery at [facility]. Smokes 1/2 ppd, lives by himself, daughters live nearby and help with meds.     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     amiodarone (PACERONE) 200 MG tablet TAKE 1/2 TABLET EVERY DAY 45 tablet 3   apixaban (ELIQUIS) 5 MG TABS tablet TAKE 1 TABLET (5 MG TOTAL) BY MOUTH 2 (TWO) TIMES DAILY. 180 tablet 1   atorvastatin (LIPITOR) 80 MG tablet TAKE 1 TABLET (80 MG TOTAL) BY MOUTH AT BEDTIME. 90 tablet 3   dapagliflozin propanediol (FARXIGA) 10 MG TABS tablet Take 1 tablet (10 mg total) by mouth daily before breakfast. 90 tablet 1   furosemide (LASIX) 40 MG tablet TAKE 1 TABLET EVERY OTHER DAY 90 tablet 3   magnesium oxide (MAG-OX) 400 (240 Mg) MG tablet TAKE 1 TABLET BY MOUTH TWICE A DAY 180 tablet 1   metFORMIN (GLUCOPHAGE) 500 MG tablet Take 500 mg by mouth 2 (two) times daily.     metolazone (ZAROXOLYN)  2.5 MG tablet Take 1 tablet (2.5 mg total) by mouth as needed. Take only directed to by the heart failure clinic 10 tablet 0   metoprolol succinate (TOPROL-XL) 25 MG 24 hr tablet TAKE 0.5 TABLET (12.5 MG TOTAL) BY MOUTH DAILY. 45 tablet 3   ondansetron (ZOFRAN-ODT) 4 MG disintegrating tablet Take 4 mg by mouth every 8 (eight) hours as needed.     spironolactone (ALDACTONE) 25 MG tablet Take 0.5 tablets (12.5 mg total) by mouth daily. 15 tablet 11   SYMBICORT 80-4.5 MCG/ACT inhaler Inhale 2 puffs into the lungs 2 (two) times daily.      TRUEplus Lancets 33G MISC  (Patient not taking: Reported on 01/25/2024)     No current facility-administered medications for this encounter.   No Known Allergies  Social History   Socioeconomic History   Marital status: Married    Spouse name: Not on file   Number of children: 2   Years of education: Not on file   Highest education level: Not on file  Occupational History   Not on file  Tobacco Use   Smoking status: Some Days    Current packs/day: 0.50    Average packs/day: 0.5 packs/day for 60.0 years (30.0 ttl pk-yrs)    Types: Cigarettes   Smokeless tobacco: Never  Vaping Use   Vaping status: Never Used  Substance and Sexual Activity   Alcohol use: Not Currently   Drug use: Not Currently   Sexual activity: Not Currently  Other Topics Concern   Not on file  Social History Narrative   Not on file   Social Drivers of Health   Financial Resource Strain: Low Risk  (05/07/2021)   Overall Financial Resource Strain (CARDIA)    Difficulty of Paying Living Expenses: Not hard at all  Food Insecurity: No Food Insecurity (05/07/2021)   Hunger Vital Sign    Worried About Running Out of Food in the Last Year: Never true    Ran Out of Food in the Last Year: Never true  Transportation Needs: No Transportation Needs (05/07/2021)   PRAPARE - Administrator, Civil Service (Medical): No    Lack of Transportation (Non-Medical): No  Physical Activity: Inactive (05/07/2021)   Exercise Vital Sign    Days of Exercise per Week: 0 days    Minutes of Exercise per Session: 0 min  Stress: No Stress Concern Present (05/07/2021)   [employer] of Occupational Health - Occupational Stress Questionnaire    Feeling of Stress : Not at all  Social Connections: Socially Isolated (05/07/2021)   Social Connection and Isolation Panel [NHANES]    Frequency of Communication with Friends and Family: More than three times a week    Frequency of Social Gatherings with Friends and Family: More  than three times a week    Attends Religious Services: Never    Database administrator or Organizations: No    Attends Banker Meetings: Never    Marital Status: Widowed  Intimate Partner Violence: Not At Risk (05/07/2021)   Humiliation, Afraid, Rape, and Kick questionnaire    Fear of Current or Ex-Partner: No    Emotionally Abused: No    Physically Abused: No    Sexually Abused: No   Family History  Problem Relation Age of Onset   Breast cancer Mother    Diabetes Mellitus II Father    Ovarian cancer Sister    Cancer Brother    Cancer Brother    Cancer Maternal Uncle  Breast cancer Niece    Wt Readings from Last 3 Encounters:  01/25/24 79.3 kg (174 lb 12.8 oz)  01/25/24 78 kg (172 lb)  11/30/23 70.7 kg (155 lb 12.8 oz)   BP 118/72   Pulse 71   Wt 79.3 kg (174 lb 12.8 oz)   SpO2 96%   BMI 28.21 kg/m   PHYSICAL EXAM: General:  NAD. No resp difficulty, elderly, cachectic HEENT: Normal Neck: Supple. JVP to ear. + v waves Cor: Regular rate & rhythm. No rubs, gallops or murmurs. Lungs: Wheezes upper lobes, diminished lower lobes Abdomen: Soft, nontender, nondistended.  Extremities: No cyanosis, clubbing, rash, 2+ BLE edema Neuro: Alert & oriented x 3, moves all 4 extremities w/o difficulty. Affect pleasant.  Device interrogation (personally reviewed): CorVue decreased x 1 month, no VT, 1.2% VP  ASSESSMENT & PLAN:  1. 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 Had cath in 2015 with stent to LAD.  - Echo 6/24 at [facility]: LVEF < 20% RV said to be normal. G2DD. Severe biatrial enlargement. Mod-severe MR Severe TR. RVSP - Cath 11/22 LAD stent patent. RCA CTO with L-R collats RA 11 PA 64/22 34 PCW 23 mmHg Fick 3.6/2.0 - s/p ICD - NYHA IIIb symptoms, markedly volume overloaded on exam and CorVue. Weight up 19 lbs - End-stage HF and lung disease - Use Furoscix  80 + 40 KCL daily x 3 days - After 3 days, restart torsemide 40 mg daily + 20 KCL daily. - Continue Farxiga 10mg  daily. - Continue spironolactone 25 mg daily. - Continue Toprol XL 12.5 mg daily. - Dr. Gala Romney had long talk about his situation at previous visit. Suspect a component of LBBB CM, but likely too late to benefit from CRT. - Labs today.  2. Paroxysmal Atrial Fibrillation - Regular on exam today. - No AF on device interrogation. - Continue amiodarone 100 mg daily.  - Continue Eliquis 5 mg bid. No bleeding issues. - Continue current regimen.  3. CAD - Last cath 11/22 as above with stable CAD - No chest pain - Continue statin - No ASA, with Eliquis     4. LBBB - Longstanding - Not interested in CRT  5. CKD 3b - Baseline SCr 1.7-1.8 - Continue SGLT2i. - Labs today.  6. Severe COPD with ongoing tobacco use  - Smoking 1/2 ppd. - Discussed need for smoking cessation as able  7. GOC - End-stage HF and lung disease. - Consider GOC conversations next visit - Would benefit from Furoscix at home for PRN use  Follow up in 7-10 days with APP. He is high risk for admission.   Jacklynn Ganong, FNP  2:37 PM

## 2024-01-25 NOTE — Progress Notes (Unsigned)
 Patient Care Team: Lonie Peak, PA-C as PCP - General (Physician Assistant) Dellia Beckwith, MD as PCP - Hematology/Oncology (Oncology) Yates Decamp, MD as PCP - Cardiology (Cardiology) Petra Kuba (Cardiology) Marcellus Scott, MD as Referring Physician (Specialist)   HPI  Kevin [name] is a 83 y.o. male seen to establish ICD follow-up.  Has been followed by [facility] cardiology.  Longstanding systolic heart failure in the context of coronary and valvular heart disease with pulmonary hypertension and left bundle branch block.  Most recent echo less than 20% 6/24 severe biatrial enlargement severe MR and TR and Dr. Loralyn Freshwater most assessment likely end-stage heart failure lung disease with New York Heart Association class IIIb symptoms.  His device is approaching ERI.  He is markedly limited his activity.  His "adopted daughter "describes it as quitting old.  Remains on amiodarone for atrial fibrillation to 20 mg a day.  On Eliquis.    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)    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   amiodarone (PACERONE) 200 MG tablet TAKE 1/2 TABLET EVERY DAY   apixaban (ELIQUIS) 5 MG TABS tablet TAKE 1 TABLET (5 MG TOTAL) BY MOUTH 2 (TWO) TIMES DAILY.   atorvastatin (LIPITOR) 80 MG tablet TAKE 1 TABLET (80 MG TOTAL) BY MOUTH AT BEDTIME.   dapagliflozin propanediol (FARXIGA) 10 MG TABS tablet Take 1 tablet (10 mg total) by mouth daily before breakfast.   furosemide (LASIX) 40 MG tablet TAKE 1 TABLET EVERY OTHER DAY   magnesium oxide (MAG-OX) 400 (240 Mg) MG tablet TAKE 1 TABLET BY MOUTH TWICE A DAY   metFORMIN (GLUCOPHAGE) 500 MG tablet Take 500 mg by mouth 2 (two) times daily.   metolazone (ZAROXOLYN) 2.5 MG tablet Take 1 tablet (2.5 mg total) by mouth as needed. Take only directed to by the heart failure clinic   metoprolol succinate (TOPROL-XL) 25 MG 24 hr tablet TAKE 0.5 TABLET (12.5 MG TOTAL) BY MOUTH DAILY.   ondansetron (ZOFRAN-ODT) 4 MG disintegrating tablet Take 4 mg by mouth every 8 (eight) hours as needed.   spironolactone (ALDACTONE) 25 MG tablet Take 0.5 tablets (12.5 mg total) by mouth daily.   SYMBICORT 80-4.5 MCG/ACT inhaler Inhale 2 puffs into the lungs  2 (two) times daily.   torsemide (DEMADEX) 20 MG tablet Take 2 tablets (40 mg total) by mouth daily.   TRUEplus Lancets 33G MISC     No Known Allergies    Review of Systems negative except from HPI and PMH  Physical Exam BP 106/70   Pulse 74   Ht 5\' 6"  (1.676 m)   Wt 172 lb (78 kg)   SpO2 96%   BMI 27.76 kg/m  Well developed and well nourished in no acute distress HENT normal E scleral and icterus clear Neck Supple JVP flat; carotids brisk and full Clear to  ausculation Regular rate and rhythm, no murmurs gallops or rub Soft with active bowel sounds No clubbing cyanosis 2+ Edema Alert and oriented, grossly normal motor and sensory function Skin Warm and Dry  ECG sinus at 74 27/19/47 IVCD with a left bundle appearance with multi fractionation of 1 and L and V6 but the vector in V6 is negative  CrCl cannot be calculated (Patient's most recent lab result is older than the maximum 21 days allowed.).   Assessment and  Plan Ischemic and valvular cardiomyopathy  Left bundle branch block  Dual-chamber defibrillator approaching ERI  Atrial fibrillation-persistent on amiodarone and Eliquis  COPD  Congestive heart failure-chronic-systolic-class IIIb    Device function is normal.  Battery is approaching ERI.  Broached the topic as to whether it is appropriate or not appropriate to consider battery generator replacement as its impact on longevity at this point is probably vanishing quickly.  He will discuss it with his family will review it in a few months.  In this regard, Dr. Prescott Gum assessment regarding end-stage of lung and heart failure is important.  Recently, on interrogation of his device he has very little atrial fibrillation.  I would be inclined to decrease his amiodarone to 100 mg a day   Current medicines are reviewed at length with the patient today .  The patient does not*** have concerns regarding medicines.

## 2024-01-25 NOTE — Addendum Note (Signed)
 Encounter addended by: Demetrius Charity, RN on: 01/25/2024 3:22 PM  Actions taken: Clinical Note Signed

## 2024-01-26 ENCOUNTER — Telehealth: Payer: Self-pay | Admitting: Student

## 2024-01-26 ENCOUNTER — Telehealth (HOSPITAL_COMMUNITY): Payer: Self-pay | Admitting: Cardiology

## 2024-01-26 NOTE — Telephone Encounter (Signed)
   Patient's daughter Ricard Dillon) called the after-hours line. Called and spoke with her. Patient was seen in the Advanced CHF clinic yesterday and prescribed Furoscix. Plan was for Furoscix 80mg  + KCl for 3 days and then Torsemide 40mg  daily + KCl daily after that. He was given one sample of Furoscix and then was going to have the other 2 doses delivered. However, daughter states this is going to cost patient $1,000 after insurance which is not doable. Daughter states she was also informed there was a wait list for patient assistance. She is calling to ask what they should do and is wondering if patient will need to be admitted if they cannot get any more doses of Furoscix. Patient did take dose of Furoscix this morning and has had good urine output with this.  Daughter states patient is stable from yesterday with no worsening symptoms.  Will route this message to the Advanced CHF team triage pool and ask them to get back to daughter first thing in the morning - wonder if they can provide any more samples? However, recommend going to the ED if he has any worsening symptoms. Daughter voiced understanding and agreed.  Corrin Parker, PA-C 01/26/2024 5:52 PM

## 2024-01-26 NOTE — Telephone Encounter (Signed)
 Pts daughter called to report furoscix shipment has not been received   Returned call for details and to provide Furoscix Direct # -no answer -unable to leave message Vm

## 2024-01-27 ENCOUNTER — Other Ambulatory Visit (HOSPITAL_COMMUNITY): Payer: Self-pay

## 2024-01-27 NOTE — Telephone Encounter (Signed)
 Will forward to provider for input  (samples vs IV lasix at infusion clinic vs additional diuretic adjustment)  Clinic has furoscix 3 samples on hand

## 2024-01-27 NOTE — Telephone Encounter (Signed)
 Daughter Neysa Bonito aware Sample in the front office Furoscix rep notified for additional samples and patient assistance   Medication Samples have been provided to the patient.  Drug name: furoscix       Strength: 80mg /36mL        Qty: 1  LOT: [id number]  Exp.Date: 03/09/25  Dosing instructions: one infuser as directed  The patient has been instructed regarding the correct time, dose, and frequency of taking this medication, including desired effects and most common side effects.   Magda Bernheim M 11:14 AM 01/27/2024

## 2024-01-28 NOTE — Telephone Encounter (Signed)
 Christy aware Second sample in the front office  Medication Samples have been provided to the patient.  Drug name: furoscix       Strength: 80/10        Qty: 1  LOT: [id number]  Exp.Date: 03/09/2025  Dosing instructions: one infuser as directed  The patient has been instructed regarding the correct time, dose, and frequency of taking this medication, including desired effects and most common side effects.   Theresia Bough 4:52 PM 01/28/2024

## 2024-01-30 DIAGNOSIS — I5040 Unspecified combined systolic (congestive) and diastolic (congestive) heart failure: Secondary | ICD-10-CM | POA: Diagnosis not present

## 2024-01-30 DIAGNOSIS — J9611 Chronic respiratory failure with hypoxia: Secondary | ICD-10-CM | POA: Diagnosis not present

## 2024-01-30 DIAGNOSIS — J449 Chronic obstructive pulmonary disease, unspecified: Secondary | ICD-10-CM | POA: Diagnosis not present

## 2024-02-01 ENCOUNTER — Telehealth (HOSPITAL_COMMUNITY): Payer: Self-pay

## 2024-02-01 NOTE — Progress Notes (Signed)
 ADVANCED HF CLINIC NOTE  Primary Care: Lonie Peak, PA-C Primary Cardiologist: Yates Decamp, MD HF Cardiologist: Dr. Gala Romney  Reason for Visit: f/u for chronic systolic heart failure   HPI: Mr. Costabile is an 83 y.o. 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 Flora Lipps for further evaluation of his HF   Diagnosed with HF 01/2010 at [facility]. Cath at that time showed EF 25-35%, pRCA CTO otherwise ok. Had cath in 2015 with stent to LAD.   Was followed in [place] for several years then transferred to [facility].   Cath 11/22: LM: Normal LAD: Patent mid LAD stent. No significant disease Lcx: No significant disease RCA: Ostial 100% occlusion. Left-to-right collateral up to mid RCA RA 11 PA 64/22 34 PCW 23 mmHg Fick 3.6/2.0  Echo 6/24 at [place]: EF < 20% RV said to be normal, G2DD, severe biatrial enlargement, mod-severe MR, severe TR. RVSP 73 mmHG  Follow up 11/17/23 with Dr. Gala Romney, struggling with advanced HF symptoms and poor tolerance of GDMT. SCr climbing. Volume up with NYHA IIIb symptoms. Felt to be end-stage HF. Torsemide increased to 40 mg daily.  Seen last wk for f/u. Had ran out of torsemide x 1 month, subsequently volume overloaded w/ LEE and symptomatic w/ exertional dyspnea and orthopnea, sleeping on 3 pillows. CorVue also elevated and wt was up 19 lb. Plan was to treat w/ Furoscix but unfortunately unable to afford as copay was too high.   Returns back for f/u.      Smokes 1/2 ppd, lives by himself, daughters live nearby and help with meds.     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     amiodarone (PACERONE) 200 MG tablet TAKE 1/2 TABLET EVERY DAY 45 tablet 3   apixaban (ELIQUIS) 5 MG TABS tablet TAKE 1 TABLET (5 MG TOTAL) BY MOUTH 2 (TWO) TIMES DAILY. 180 tablet 1   atorvastatin (LIPITOR) 80 MG tablet TAKE 1 TABLET (80 MG TOTAL) BY MOUTH AT BEDTIME. 90 tablet 3   dapagliflozin propanediol (FARXIGA) 10 MG TABS tablet Take 1 tablet (10 mg total) by mouth daily before breakfast. 90 tablet 1   magnesium oxide (MAG-OX) 400 (240 Mg) MG tablet TAKE 1 TABLET BY MOUTH TWICE A DAY 180 tablet 1   metFORMIN (GLUCOPHAGE) 500 MG tablet Take 500 mg by mouth 2 (two) times daily.     metolazone (ZAROXOLYN) 2.5 MG tablet Take 1 tablet (2.5 mg total) by mouth as needed. Take only directed to by the heart failure clinic 10  tablet 0   metoprolol succinate (TOPROL-XL) 25 MG 24 hr tablet TAKE 0.5 TABLET (12.5 MG TOTAL) BY MOUTH DAILY. 45 tablet 3   ondansetron (ZOFRAN-ODT) 4 MG disintegrating tablet Take 4 mg by mouth every 8 (eight) hours as needed.     potassium chloride SA (KLOR-CON M) 20 MEQ tablet Take 1 tablet (20 mEq total) by mouth daily. 38 tablet 8   spironolactone (ALDACTONE) 25 MG tablet Take 0.5 tablets (12.5 mg total) by mouth daily. 15 tablet 11   SYMBICORT 80-4.5 MCG/ACT inhaler Inhale 2 puffs into the lungs 2 (two) times daily.     torsemide (DEMADEX) 20 MG  tablet Take 2 tablets (40 mg total) by mouth daily. 180 tablet 3   torsemide (DEMADEX) 20 MG tablet Take 2 tablets (40 mg total) by mouth daily. 30 tablet 0   TRUEplus Lancets 33G MISC  (Patient not taking: Reported on 01/25/2024)     No current facility-administered medications for this visit.   No Known Allergies  Social History   Socioeconomic History   Marital status: Married    Spouse name: Not on file   Number of children: 2   Years of education: Not on file   Highest education level: Not on file  Occupational History   Not on file  Tobacco Use   Smoking status: Some Days    Current packs/day: 0.50    Average packs/day: 0.5 packs/day for 60.0 years (30.0 ttl pk-yrs)    Types: Cigarettes   Smokeless tobacco: Never  Vaping Use   Vaping status: Never Used  Substance and Sexual Activity   Alcohol use: Not Currently   Drug use: Not Currently   Sexual activity: Not Currently  Other Topics Concern   Not on file  Social History Narrative   Not on file   Social Drivers of Health   Financial Resource Strain: Low Risk  (05/07/2021)   Overall Financial Resource Strain (CARDIA)    Difficulty of Paying Living Expenses: Not hard at all  Food Insecurity: No Food Insecurity (05/07/2021)   Hunger Vital Sign    Worried About Running Out of Food in the Last Year: Never true    Ran Out of Food in the Last Year: Never true  Transportation Needs: No Transportation Needs (05/07/2021)   PRAPARE - Administrator, Civil Service (Medical): No    Lack of Transportation (Non-Medical): No  Physical Activity: Inactive (05/07/2021)   Exercise Vital Sign    Days of Exercise per Week: 0 days    Minutes of Exercise per Session: 0 min  Stress: No Stress Concern Present (05/07/2021)   [employer] of Occupational Health - Occupational Stress Questionnaire    Feeling of Stress : Not at all  Social Connections: Socially Isolated (05/07/2021)   Social Connection and Isolation Panel  [NHANES]    Frequency of Communication with Friends and Family: More than three times a week    Frequency of Social Gatherings with Friends and Family: More than three times a week    Attends Religious Services: Never    Database administrator or Organizations: No    Attends Banker Meetings: Never    Marital Status: Widowed  Intimate Partner Violence: Not At Risk (05/07/2021)   Humiliation, Afraid, Rape, and Kick questionnaire    Fear of Current or Ex-Partner: No    Emotionally Abused: No    Physically Abused: No    Sexually Abused: No   Family History  Problem Relation  Age of Onset   Breast cancer Mother    Diabetes Mellitus II Father    Ovarian cancer Sister    Cancer Brother    Cancer Brother    Cancer Maternal Uncle    Breast cancer Niece    Wt Readings from Last 3 Encounters:  01/25/24 79.3 kg (174 lb 12.8 oz)  01/25/24 78 kg (172 lb)  11/30/23 70.7 kg (155 lb 12.8 oz)   There were no vitals taken for this visit.  PHYSICAL EXAM: General:  Well appearing. No respiratory difficulty HEENT: normal Neck: supple. no JVD. Carotids 2+ bilat; no bruits. No lymphadenopathy or thyromegaly appreciated. Cor: PMI nondisplaced. Regular rate & rhythm. No rubs, gallops or murmurs. Lungs: clear Abdomen: soft, nontender, nondistended. No hepatosplenomegaly. No bruits or masses. Good bowel sounds. Extremities: no cyanosis, clubbing, rash, edema Neuro: alert & oriented x 3, cranial nerves grossly intact. moves all 4 extremities w/o difficulty. Affect pleasant.   Device interrogation (personally reviewed): CorVue decreased x 1 month, no VT, 1.2% VP  ASSESSMENT & PLAN:  1. 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 Had cath in 2015 with stent to LAD.  - Echo 6/24 at [facility]: LVEF < 20% RV said to be normal. G2DD. Severe biatrial enlargement. Mod-severe MR Severe TR. RVSP -  Cath 11/22 LAD stent patent. RCA CTO with L-R collats RA 11 PA 64/22 34 PCW 23 mmHg Fick 3.6/2.0 - s/p ICD - NYHA IIIb symptoms, markedly volume overloaded on exam and CorVue. Weight up 19 lbs - End-stage HF and lung disease - Use Furoscix 80 + 40 KCL daily x 3 days - After 3 days, restart torsemide 40 mg daily + 20 KCL daily. - Continue Farxiga 10mg  daily. - Continue spironolactone 25 mg daily. - Continue Toprol XL 12.5 mg daily. - Dr. Gala Romney had long talk about his situation at previous visit. Suspect a component of LBBB CM, but likely too late to benefit from CRT. - Labs today.  2. Paroxysmal Atrial Fibrillation - Regular on exam today. - No AF on device interrogation. - Continue amiodarone 100 mg daily.  - Continue Eliquis 5 mg bid. No bleeding issues. - Continue current regimen.  3. CAD - Last cath 11/22 as above with stable CAD - No chest pain - Continue statin - No ASA, with Eliquis     4. LBBB - Longstanding - Not interested in CRT  5. CKD 3b - Baseline SCr 1.7-1.8 - Continue SGLT2i. - Labs today.  6. Severe COPD with ongoing tobacco use  - Smoking 1/2 ppd. - Discussed need for smoking cessation as able  7. GOC - End-stage HF and lung disease. - Consider GOC conversations next visit - Would benefit from Furoscix at home for PRN use  Follow up in 7-10 days with APP. He is high risk for admission.   Robbie Lis, PA-C  1:34 PM

## 2024-02-01 NOTE — Telephone Encounter (Signed)
 Called to confirm/remind patient of their appointment at the [facility] on 02/02/2024 3:30.   Appointment:   [x] Confirmed  [] Left mess   [] No answer/No voice mail  [] Phone not in service  Patient reminded to bring all medications and/or complete list.  Confirmed patient has transportation. Gave directions, instructed to utilize valet parking.

## 2024-02-02 ENCOUNTER — Ambulatory Visit (HOSPITAL_COMMUNITY)
Admission: RE | Admit: 2024-02-02 | Discharge: 2024-02-02 | Disposition: A | Discharge status: Home / Self Care | Source: Ambulatory Visit | Attending: Cardiology | Admitting: Cardiology

## 2024-02-02 ENCOUNTER — Encounter (HOSPITAL_COMMUNITY): Payer: Self-pay

## 2024-02-02 VITALS — BP 112/54 | HR 68 | Ht 66.0 in | Wt 157.8 lb

## 2024-02-02 DIAGNOSIS — Z7984 Long term (current) use of oral hypoglycemic drugs: Secondary | ICD-10-CM | POA: Insufficient documentation

## 2024-02-02 DIAGNOSIS — I272 Pulmonary hypertension, unspecified: Secondary | ICD-10-CM | POA: Diagnosis not present

## 2024-02-02 DIAGNOSIS — J449 Chronic obstructive pulmonary disease, unspecified: Secondary | ICD-10-CM | POA: Diagnosis not present

## 2024-02-02 DIAGNOSIS — I447 Left bundle-branch block, unspecified: Secondary | ICD-10-CM | POA: Diagnosis not present

## 2024-02-02 DIAGNOSIS — I2510 Atherosclerotic heart disease of native coronary artery without angina pectoris: Secondary | ICD-10-CM | POA: Diagnosis not present

## 2024-02-02 DIAGNOSIS — F17210 Nicotine dependence, cigarettes, uncomplicated: Secondary | ICD-10-CM | POA: Diagnosis not present

## 2024-02-02 DIAGNOSIS — I428 Other cardiomyopathies: Secondary | ICD-10-CM | POA: Insufficient documentation

## 2024-02-02 DIAGNOSIS — E1122 Type 2 diabetes mellitus with diabetic chronic kidney disease: Secondary | ICD-10-CM | POA: Diagnosis not present

## 2024-02-02 DIAGNOSIS — Z4502 Encounter for adjustment and management of automatic implantable cardiac defibrillator: Secondary | ICD-10-CM | POA: Diagnosis not present

## 2024-02-02 DIAGNOSIS — Z955 Presence of coronary angioplasty implant and graft: Secondary | ICD-10-CM | POA: Diagnosis not present

## 2024-02-02 DIAGNOSIS — I081 Rheumatic disorders of both mitral and tricuspid valves: Secondary | ICD-10-CM | POA: Insufficient documentation

## 2024-02-02 DIAGNOSIS — Z7951 Long term (current) use of inhaled steroids: Secondary | ICD-10-CM | POA: Diagnosis not present

## 2024-02-02 DIAGNOSIS — I5022 Chronic systolic (congestive) heart failure: Secondary | ICD-10-CM | POA: Diagnosis not present

## 2024-02-02 DIAGNOSIS — I5023 Acute on chronic systolic (congestive) heart failure: Secondary | ICD-10-CM | POA: Diagnosis not present

## 2024-02-02 DIAGNOSIS — I5084 End stage heart failure: Secondary | ICD-10-CM | POA: Diagnosis not present

## 2024-02-02 DIAGNOSIS — N1832 Chronic kidney disease, stage 3b: Secondary | ICD-10-CM | POA: Insufficient documentation

## 2024-02-02 DIAGNOSIS — I130 Hypertensive heart and chronic kidney disease with heart failure and stage 1 through stage 4 chronic kidney disease, or unspecified chronic kidney disease: Secondary | ICD-10-CM | POA: Insufficient documentation

## 2024-02-02 DIAGNOSIS — Z79899 Other long term (current) drug therapy: Secondary | ICD-10-CM | POA: Diagnosis not present

## 2024-02-02 DIAGNOSIS — Z7901 Long term (current) use of anticoagulants: Secondary | ICD-10-CM | POA: Insufficient documentation

## 2024-02-02 DIAGNOSIS — I480 Paroxysmal atrial fibrillation: Secondary | ICD-10-CM | POA: Diagnosis not present

## 2024-02-02 LAB — BASIC METABOLIC PANEL
Anion gap: 11 (ref 5–15)
BUN: 33 mg/dL — ABNORMAL HIGH (ref 8–23)
CO2: 25 mmol/L (ref 22–32)
Calcium: 9.1 mg/dL (ref 8.9–10.3)
Chloride: 102 mmol/L (ref 98–111)
Creatinine, Ser: 2.03 mg/dL — ABNORMAL HIGH (ref 0.61–1.24)
GFR, Estimated: 32 mL/min — ABNORMAL LOW (ref >60)
Glucose, Bld: 160 mg/dL — ABNORMAL HIGH (ref 70–99)
Potassium: 4.2 mmol/L (ref 3.5–5.1)
Sodium: 138 mmol/L (ref 135–145)

## 2024-02-02 NOTE — Patient Instructions (Signed)
  Lab Work:  Labs done today, your results will be available in MyChart, we will contact you for abnormal readings.  Follow-Up in: 3 months as scheduled   At the [facility], you and your health needs are our priority. We have a designated team specialized in the treatment of Heart Failure. This Care Team includes your primary Heart Failure Specialized Cardiologist (physician), Advanced Practice Providers (APPs- Physician Assistants and Nurse Practitioners), and Pharmacist who all work together to provide you with the care you need, when you need it.   You may see any of the following providers on your designated Care Team at your next follow up:  Dr. Arvilla Meres Dr. Marca Ancona Dr. Dorthula Nettles Dr. Theresia Bough Tonye Becket, NP Robbie Lis, [place] [facility] [place], [place] Brynda Peon, NP [place] Lee, NP Karle Plumber, PharmD   Please be sure to bring in all your medications bottles to every appointment.   Need to Contact us:  If you have any questions or concerns before your next appointment please send [place] a message through [place] or call our office at [phone].    TO LEAVE A MESSAGE FOR THE NURSE SELECT OPTION 2, PLEASE LEAVE A MESSAGE INCLUDING: YOUR NAME DATE OF BIRTH CALL BACK NUMBER REASON FOR CALL**this is important as we prioritize the call backs  YOU WILL RECEIVE A CALL BACK THE SAME DAY AS LONG AS YOU CALL BEFORE 4:00 PM0

## 2024-02-17 ENCOUNTER — Encounter (HOSPITAL_COMMUNITY)

## 2024-02-19 ENCOUNTER — Telehealth: Payer: Self-pay | Admitting: Oncology

## 2024-02-19 ENCOUNTER — Inpatient Hospital Stay: Payer: Medicare PPO

## 2024-02-19 ENCOUNTER — Encounter: Payer: Self-pay | Admitting: Oncology

## 2024-02-19 ENCOUNTER — Inpatient Hospital Stay: Payer: Medicare PPO | Attending: Oncology | Admitting: Oncology

## 2024-02-19 ENCOUNTER — Other Ambulatory Visit: Payer: Self-pay | Admitting: Oncology

## 2024-02-19 VITALS — BP 115/63 | HR 65 | Temp 98.0°F | Resp 18 | Ht 66.0 in | Wt 154.0 lb

## 2024-02-19 DIAGNOSIS — D509 Iron deficiency anemia, unspecified: Secondary | ICD-10-CM | POA: Insufficient documentation

## 2024-02-19 DIAGNOSIS — Z8041 Family history of malignant neoplasm of ovary: Secondary | ICD-10-CM | POA: Insufficient documentation

## 2024-02-19 DIAGNOSIS — D519 Vitamin B12 deficiency anemia, unspecified: Secondary | ICD-10-CM | POA: Diagnosis not present

## 2024-02-19 DIAGNOSIS — Z85820 Personal history of malignant melanoma of skin: Secondary | ICD-10-CM | POA: Insufficient documentation

## 2024-02-19 DIAGNOSIS — Z803 Family history of malignant neoplasm of breast: Secondary | ICD-10-CM | POA: Diagnosis not present

## 2024-02-19 DIAGNOSIS — F17210 Nicotine dependence, cigarettes, uncomplicated: Secondary | ICD-10-CM | POA: Insufficient documentation

## 2024-02-19 DIAGNOSIS — C4339 Malignant melanoma of other parts of face: Secondary | ICD-10-CM

## 2024-02-19 DIAGNOSIS — Z9221 Personal history of antineoplastic chemotherapy: Secondary | ICD-10-CM | POA: Diagnosis not present

## 2024-02-19 LAB — CBC WITH DIFFERENTIAL (CANCER CENTER ONLY)
Abs Immature Granulocytes: 0.03 10*3/uL (ref 0.00–0.07)
Basophils Absolute: 0.1 10*3/uL (ref 0.0–0.1)
Basophils Relative: 1 %
Eosinophils Absolute: 0.2 10*3/uL (ref 0.0–0.5)
Eosinophils Relative: 4 %
HCT: 40.8 % (ref 39.0–52.0)
Hemoglobin: 12.4 g/dL — ABNORMAL LOW (ref 13.0–17.0)
Immature Granulocytes: 1 %
Lymphocytes Relative: 15 %
Lymphs Abs: 0.7 10*3/uL (ref 0.7–4.0)
MCH: 28.3 pg (ref 26.0–34.0)
MCHC: 30.4 g/dL (ref 30.0–36.0)
MCV: 93.2 fL (ref 80.0–100.0)
Monocytes Absolute: 0.5 10*3/uL (ref 0.1–1.0)
Monocytes Relative: 11 %
Neutro Abs: 3.1 10*3/uL (ref 1.7–7.7)
Neutrophils Relative %: 68 %
Platelet Count: 152 10*3/uL (ref 150–400)
RBC: 4.38 MIL/uL (ref 4.22–5.81)
RDW: 16.9 % — ABNORMAL HIGH (ref 11.5–15.5)
WBC Count: 4.6 10*3/uL (ref 4.0–10.5)
nRBC: 0 % (ref 0.0–0.2)
nRBC: 0 /100{WBCs}

## 2024-02-19 LAB — CMP (CANCER CENTER ONLY)
ALT: 27 U/L (ref 0–44)
AST: 30 U/L (ref 15–41)
Albumin: 4.8 g/dL (ref 3.5–5.0)
Alkaline Phosphatase: 150 U/L — ABNORMAL HIGH (ref 38–126)
Anion gap: 13 (ref 5–15)
BUN: 33 mg/dL — ABNORMAL HIGH (ref 8–23)
CO2: 27 mmol/L (ref 22–32)
Calcium: 9.9 mg/dL (ref 8.9–10.3)
Chloride: 105 mmol/L (ref 98–111)
Creatinine: 1.64 mg/dL — ABNORMAL HIGH (ref 0.61–1.24)
GFR, Estimated: 42 mL/min — ABNORMAL LOW (ref >60)
Glucose, Bld: 171 mg/dL — ABNORMAL HIGH (ref 70–99)
Potassium: 4 mmol/L (ref 3.5–5.1)
Sodium: 145 mmol/L (ref 135–145)
Total Bilirubin: 0.7 mg/dL (ref 0.0–1.2)
Total Protein: 7.9 g/dL (ref 6.5–8.1)

## 2024-02-19 MED ORDER — SODIUM CHLORIDE 0.9% FLUSH
10.0000 mL | INTRAVENOUS | Status: DC | PRN
  Administered 2024-02-19: 10 mL

## 2024-02-19 MED ORDER — HEPARIN SOD (PORK) LOCK FLUSH 100 UNIT/ML IV SOLN
500.0000 [IU] | Freq: Once | INTRAVENOUS | Status: AC | PRN
  Administered 2024-02-19: 500 [IU]

## 2024-02-19 NOTE — Progress Notes (Addendum)
 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  He had a hypermetabolic right submandibular node on PET, but no evidence of distant metastasis.  His original lesion was diagnosed in March 2016.  He underwent excision, but refused aggressive therapy.  He has been receiving palliative nivolumab  since November 2016, with several interruptions. His treatment was on hold from December 2022 to April 2023 due to cardiorespiratory issues. CT imaging in August 2023 remains without evidence of metastatic disease.  However, there were increasing bilateral pleural effusions and small volume abdominal pelvic ascites, with stable pericardial effusion, felt to be most likely due to fluid overload.  His heart failure is managed by cardiology. He was on immunotherapy for years and did well but developed multiple issues in 2024 including CHF and diarrhea, which was possibly related to the immunotherapy. However, he did not require steroids and this has resolved. We therefore stopped his treatment in 2024, he had CT scans in October of that year which remain stable. He continues to do well.   B12 deficiency anemia His B12 injections have been given sporadically as well.  Repeat B12 level in April was normal.  He last received B12 on June 5. His hemoglobin has dropped, so I will repeat B12 and folate, as well as iron studies. His hemoglobin came up from 11.2 to 12.4.    Iron deficiency anemia He was found to have iron deficiency in July 2023.  He was given IV iron at that time.  His hemoglobin had dropped again in April.  Iron studies in April revealed recurrent iron deficiency, so he received a dose of Feraheme  on June 5.     Plan: I find no evidence of recurrent melanoma.  He is being followed regularly by his cardiologist. Patient informed me that he missed 3 days of his medications. He has a WBC of 4.6, low hemoglobin of 12.4 improved from 11.2, and platelet count of 152. His CMP is pending and I will call him with the results. I will see him back in 3 months with CBC and CMP. We may consider repeat scans in the fall. The patient understands the plans discussed today and is in agreement with them.  He knows to contact our office if he develops concerns prior to his next appointment.  I provided 9 minutes of face-to-face time during this this encounter and > 50% was spent counseling as documented under my assessment and plan.   Kevin Baumgartner, MD [name] - A DEPT OF [name] [address] [place] [address] Dept: [phone] Dept Fax: [phone]   Orders Placed This Encounter  Procedures   CBC with Differential (Cancer Center Only)    Standing Status:   Future    Number of Occurrences:   1    Expiration Date:   02/18/2025   CMP (Cancer Center only)    Standing Status:   Future    Number of Occurrences:   1    Expiration Date:   02/18/2025     CHIEF COMPLAINT:  CC: Metastatic melanoma  Current Treatment: Surveillance  HISTORY OF PRESENT ILLNESS:  Kevin [name] is an 83 year old with 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  His original  lesion was diagnosed in March 2016.  He underwent excision, but refused aggressive surgery.  The lesion was at least 4 mm thick with a Clark's level 4, and a mitotic index 10/mm.  The margins were positive, but wide reexcision revealed no residual melanoma.  He had recurrence within 2 months and the lesion had been steadily growing.  He initially had refused seeing a medical oncologist, but finally came to us  for evaluation and treatment in October 2016.  He does have extensive comorbidities including COPD, diabetes, hypertension, coronary  artery disease, history of myocardial infarction, and pacemaker with AICD.  PET revealed hypermetabolism in the right submandibular node, but no evidence of distant metastasis.  He was having significant pain and requiring regular hydrocodone doses.  He also had severe disfigurement and avoided going out in public because of the facial lesion.  He has been receiving palliative nivolumab  since November 2016 and has had a good response with a decrease in the right facial tumor.  He has had associated hypomagnesemia and hypokalemia, so is on oral magnesium  and potassium supplements. He has had mild chronic anemia, which has been stable. He was found to have B12 deficiency and continues B12 injections monthly.  CT head/neck in May 2017 revealed some nonspecific skin thickening in the right face in the area of his melanoma in the right submandibular lymph node had decreased in size, now measuring 6 x 12 mm, but still prominent when compared to the left submandibular node.  He had 15 cycles of nivolumab  and relocated to continue to get the same treatment.  He had his 30th dose in February 2018 and then returned to the [place] area.     He was admitted to the hospital in early March 2018 for congestive heart failure associated with pleural and pericardial effusions.  He had no leukocytosis, fever or purulent sputum.  The echocardiogram revealed severe diffuse hypokinesis with mild concentric hypertrophy and markedly depressed ejection fraction of 20 to 25%, associated with moderate mitral regurgitation and moderate left atrial dilatation.  He improved with diuresis.  CT chest in March 2018 revealed stable cardiomegaly and stable small pericardial effusion, but no evidence of immune mediated pneumonitis or metastatic disease.  The previously seen bilateral pleural effusions had resolved.  He returned to our care and nivolumab  every 2 weeks was resumed in April.  CT chest was repeated in early May after 1 month back on  therapy.  This was stable, without evidence of recurrent pleural effusions, immunotherapy toxicities, or evidence of metastatic disease.  He was switched to every 28-day nivolumab  in August 2018.  He has had stable disease so has continued on nivolumab .  CT imaging in October 2018 revealed a stable 5 mm level 1B cervical lymph node, as well as a stable 8 mm exophytic lesion of the right face.  CT chest did not reveal any evidence of metastatic disease. Borderline cardiomegaly and a small amount of pericardial fluid/thickening was seen, but not reaccumulation of the pleural effusions.  CT head did not reveal any evidence of intracranial metastasis.  There were chronic ischemic changes and evidence of old infarcts.   Unfortunately, his wife passed away in 08/09/2019and she was his primary caregiver His daughters have been caring for him since then.  CT scans have remained stable, so he has continued nivolumab  every 4 weeks.  He was given mirtazapine for depression and weight loss the dose was increased to 15 mg.  He was then lost to follow-up from  December 2021 to April 2022, at which time he resumed nivolumab  and B12 every 4 weeks.   CT head, neck and chest in June 2022 did not reveal any evidence of progressive disease.  He was admitted at [facility] in November/December 2022 due to flu and pneumonia.  Nivolumab  was placed on hold in December.  CT chest, abdomen and pelvis in February 2023 did not reveal any evidence of metastatic disease.  There was new extensive tree in bud nodularity in the right lung with most significant involvement of the right lower lobe. Findings were most consistent with an infectious or inflammatory process in a pattern suggestive of atypical infection including nontuberculous mycobacterium.  He was seen by Dr. Corita Diego, who recommended bronchoscopy, but the patient did not return for follow-up as he did not wish to pursue this procedure. Nivolumab  was resumed again in April.  CT scans in  August 2023 did not reveal obvious metastatic disease.  There was an increase in his bilateral pleural effusions, stable pericardial effusion and mild ascites, felt to most likely be due to fluid overload.  He is on Entresto  and follows with cardiology.    He received a 22nd cycle of nivolumab  in March.  His 23rd cycle was delayed due to C. difficile in April.  CT face, chest, abdomen and pelvis in May not reveal any definitive signs of recurrent or metastatic disease.  There was unchanged mild chronic skin thickening in the right infraorbital/pre-maxillary region, at site of the patient's treated melanoma. No CT findings to suggest recurrent tumor at this site. No lymphadenopathy within the imaged neck. Small 6 mm nodule in the RIGHT lower lobe could represent focal inflammation or atelectasis. Findings of suspected heart failure with effusions and pulmonary edema. Pulmonary edema is new compared to previous imaging. RIGHT pleural effusion is slightly diminished. Patchy ground-glass may also reflect a component of edema. Cardiomegaly and signs of coronary artery disease/PTC with stable moderate pericardial effusion. Anasarca with body wall edema and ascites. Mild skin thickening over the anterior abdominal wall and in the gluteal cleft similar to prior imaging.  He saw his cardiologist and underwent echocardiogram on June 4, which revealed left ventricle cavity is severely dilated. Mild concentric hypertrophy of the left ventricle. Severe global hypokinesis. LVEF <20%. No obvious LV thrombus noted on this noncontrast study.  Doppler evidence of grade II (pseudonormal) diastolic dysfunction, elevated LAP. Device lead seen in RA/RV. Left atrial cavity is severely dilated. Right atrial cavity is severely dilated. Native trileaflet aortic valve. No evidence of aortic stenosis. Mild to moderate aortic regurgitation. Moderate to severe mitral regurgitation. Severe tricuspid regurgitation. Pulmonary hypertension.  Estimated pulmonary artery systolic pressure 73 mmHg. Small to moderate circumferential pericardial effusion. Previous study in February 2023 reported mild to moderate tricuspid regurgitation, estimated PASP 55 mmHg. Dr. Almer Jacobson recommended follow-up CT imaging in 6 months.  His last CT scan in October 2024 remains clear and his nivolumab  was stopped.  He is on surveillance only.  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 (HCC)  01/24/2015 Cancer Staging   Staging form: Melanoma of the Skin, AJCC 8th Edition - Clinical stage from 01/24/2015: Stage IIB (cT3b, cN0, cM0) - Signed by Kevin Baumgartner, MD on 08/19/2021 Histopathologic type: Malignant melanoma, NOS (except juvenile melanoma [id number]) Stage prefix: Initial diagnosis Laterality: Right Lymph-vascular invasion (LVI): LVI not present (absent)/not identified Diagnostic confirmation: Positive histology Specimen type: Excision Staged by: Managing physician Mitotic count: 10 Mitotic unit: mm2 Clark's level: Level IV  Tumor-infiltrating lymphocytes: Unknown Neurotropism: Absent Presence of extranodal extension: Absent Breslow depth (mm): 40 Ulceration of the epidermis: Yes Microsatellites: No Primary tumor regression: Unknown Sentinel lymph node biopsy performed: No Matted nodes: No Prognostic indicators: Wide excision negative Stage used in treatment planning: Yes National guidelines used in treatment planning: Yes Type of national guideline used in treatment planning: NCCN   07/18/2020 - 06/18/2022 Chemotherapy   Patient is on Treatment Plan : MELANOMA Nivolumab  + B12 q28d     07/18/2020 - 04/15/2023 Chemotherapy   Patient is on Treatment Plan : MELANOMA Nivolumab  (480) q28d     07/29/2020 Initial Diagnosis   Malignant melanoma of other parts of face (HCC)       INTERVAL HISTORY:  Kevin [name] is here today for repeat clinical assessment for his metastatic melanoma. He was treated with immunotherapy for years but this  was stopped last year and he continues to do well off treatment. Patient states that he feels well and has no complaints of pain. He is being followed regularly by his cardiologist. Patient informed me that he missed 3 days of his medications. He has a WBC of 4.6, low hemoglobin of 12.4 improved from 11.2, and platelet count of 152. His CMP is pending and I will call him with the results. I will see him back in 3 months with CBC and CMP. We may consider repeat scans in the fall. He denies signs of infection such as sore throat, sinus drainage, cough, or urinary symptoms.  He denies fevers or recurrent chills. He denies pain. He denies nausea, vomiting, chest pain, dyspnea or cough. His appetite is good and his weight has increased 16 pounds over last 6 months .   REVIEW OF SYSTEMS:  Review of Systems  Constitutional:  Positive for fatigue. Negative for appetite change, chills, diaphoresis, fever and unexpected weight change.  HENT:  Negative.  Negative for hearing loss, lump/mass, mouth sores, nosebleeds, sore throat, tinnitus, trouble swallowing and voice change.   Eyes: Negative.  Negative for eye problems and icterus.  Respiratory: Negative.  Negative for chest tightness, cough, hemoptysis, shortness of breath and wheezing.   Cardiovascular: Negative.  Negative for chest pain, leg swelling and palpitations.  Gastrointestinal: Negative.  Negative for abdominal distention, abdominal pain, blood in stool, constipation, diarrhea, nausea, rectal pain and vomiting.  Endocrine: Negative.  Negative for hot flashes.  Genitourinary: Negative.  Negative for bladder incontinence, difficulty urinating, dyspareunia, dysuria, frequency, hematuria, nocturia, pelvic pain and penile discharge.   Musculoskeletal:  Positive for arthralgias. Negative for back pain, flank pain, gait problem, myalgias, neck pain and neck stiffness.  Skin: Negative.  Negative for itching, rash and wound.  Neurological: Negative.  Negative  for dizziness, extremity weakness, gait problem, headaches, light-headedness, numbness, seizures and speech difficulty.  Hematological: Negative.  Negative for adenopathy. Does not bruise/bleed easily.  Psychiatric/Behavioral: Negative.  Negative for confusion, decreased concentration, depression, sleep disturbance and suicidal ideas. The patient is not nervous/anxious.      VITALS:  Blood pressure 115/63, pulse 65, temperature 98 F (36.7 C), temperature source Oral, resp. rate 18, height 5\' 6"  (1.676 m), weight 154 lb (69.9 kg), SpO2 99%.  Wt Readings from Last 3 Encounters:  02/19/24 154 lb (69.9 kg)  02/02/24 157 lb 12.8 oz (71.6 kg)  01/25/24 174 lb 12.8 oz (79.3 kg)    Body mass index is 24.86 kg/m.  Performance status (ECOG): 1 - Symptomatic but completely ambulatory  PHYSICAL EXAM:  Physical Exam Vitals and nursing note reviewed.  Constitutional:      General: He is not in acute distress.    Appearance: Normal appearance. He is normal weight. He is not ill-appearing, toxic-appearing or diaphoretic.  HENT:     Head: Normocephalic and atraumatic.     Right Ear: Tympanic membrane, ear canal and external ear normal. There is no impacted cerumen.     Left Ear: Tympanic membrane, ear canal and external ear normal. There is no impacted cerumen.     Nose: Nose normal. No congestion or rhinorrhea.     Mouth/Throat:     Mouth: Mucous membranes are moist.     Pharynx: Oropharynx is clear. No oropharyngeal exudate or posterior oropharyngeal erythema.  Eyes:     General: No scleral icterus.       Right eye: No discharge.        Left eye: No discharge.     Extraocular Movements: Extraocular movements intact.     Conjunctiva/sclera: Conjunctivae normal.     Pupils: Pupils are equal, round, and reactive to light.  Cardiovascular:     Rate and Rhythm: Normal rate and regular rhythm. No extrasystoles are present.    Pulses: Normal pulses.     Heart sounds: Normal heart sounds. No  murmur heard.    No friction rub. No gallop.  Pulmonary:     Effort: Pulmonary effort is normal. No respiratory distress.     Breath sounds: Normal breath sounds. No wheezing, rhonchi or rales.  Abdominal:     General: Bowel sounds are normal. There is no distension.     Palpations: Abdomen is soft. There is no mass.     Tenderness: There is no abdominal tenderness. There is no right CVA tenderness or guarding.  Musculoskeletal:        General: Normal range of motion.     Cervical back: Normal range of motion and neck supple. No tenderness.     Right lower leg: No edema.     Left lower leg: No edema.     Comments: Discoloration of the lower extremities with purplish color and minimal edema  Lymphadenopathy:     Cervical: No cervical adenopathy.     Upper Body:     Right upper body: No supraclavicular or axillary adenopathy.     Left upper body: No supraclavicular or axillary adenopathy.  Skin:    General: Skin is warm and dry.     Coloration: Skin is not jaundiced or pale.     Findings: No bruising, erythema, lesion or rash.     Comments: Persistent mild thickening of the right cheek without evidence of recurrent lesion  Neurological:     General: No focal deficit present.     Mental Status: He is alert and oriented to person, place, and time. Mental status is at baseline.     Cranial Nerves: No cranial nerve deficit.     Sensory: No sensory deficit.     Motor: No weakness.     Coordination: Coordination normal.     Gait: Gait normal.     Deep Tendon Reflexes: Reflexes normal.  Psychiatric:        Mood and Affect: Mood normal.        Behavior: Behavior normal.        Thought Content: Thought content normal.    LABS:      Latest Ref Rng & Units 02/19/2024    2:27 PM 01/25/2024    3:16 PM 11/30/2023    2:24 PM  CBC  WBC 4.0 - 10.5 K/uL 4.6  3.9  3.8   Hemoglobin 13.0 - 17.0 g/dL 40.9  81.1  91.4   Hematocrit 39.0 - 52.0 % 40.8  36.2  35.7   Platelets 150 - 400 K/uL 152   168  139       Latest Ref Rng & Units 02/19/2024    2:27 PM 02/02/2024    4:14 PM 01/25/2024    3:16 PM  CMP  Glucose 70 - 99 mg/dL 782  956  213   BUN 8 - 23 mg/dL 33  33  18   Creatinine 0.61 - 1.24 mg/dL 0.86  5.78  4.69   Sodium 135 - 145 mmol/L 145  138  138   Potassium 3.5 - 5.1 mmol/L 4.0  4.2  5.0   Chloride 98 - 111 mmol/L 105  102  108   CO2 22 - 32 mmol/L 27  25  22    Calcium  8.9 - 10.3 mg/dL 9.9  9.1  8.8   Total Protein 6.5 - 8.1 g/dL 7.9   6.9   Total Bilirubin 0.0 - 1.2 mg/dL 0.7   0.9   Alkaline Phos 38 - 126 U/L 150   92   AST 15 - 41 U/L 30   16   ALT 0 - 44 U/L 27   14      No results found for: "CEA1", "CEA" / No results found for: "CEA1", "CEA" No results found for: "PSA1" No results found for: "GEX528" No results found for: "CAN125"  No results found for: "TOTALPROTELP", "ALBUMINELP", "A1GS", "A2GS", "BETS", "BETA2SER", "GAMS", "MSPIKE", "SPEI"  Lab Results  Component Value Date   TIBC 295 02/26/2023   TIBC 337 02/23/2023   TIBC 321 09/24/2022   FERRITIN 118 02/26/2023   FERRITIN 64 02/23/2023   FERRITIN 108 09/24/2022   IRONPCTSAT 15 02/26/2023   IRONPCTSAT 11 (L) 02/23/2023   IRONPCTSAT 16 (L) 09/24/2022   Lab Results  Component Value Date   LDH 120 03/09/2023   LDH 132 02/23/2023   LDH 108 09/24/2022    STUDIES:     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)     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)    Thrombocytopenia (HCC) 07/29/2022    Past Surgical History:  Procedure Laterality Date   APPENDECTOMY     CARDIAC CATHETERIZATION     CORONARY ANGIOPLASTY     PACEMAKER INSERTION     RIGHT/LEFT HEART CATH AND CORONARY ANGIOGRAPHY N/A 10/09/2021   Procedure: RIGHT/LEFT HEART CATH AND CORONARY ANGIOGRAPHY;  Surgeon: Cody Das, MD;  Location: [facility] INVASIVE CV LAB;  Service: Cardiovascular;  Laterality: N/A;    Family History  Problem Relation Age of Onset   Breast cancer Mother    Diabetes Mellitus II Father    Ovarian cancer Sister    Cancer Brother    Cancer Brother    Cancer Maternal Uncle  Breast cancer Niece     Social History:  reports that he has been smoking cigarettes. He has a 30 pack-year smoking history. He has never used smokeless tobacco. He reports that he does not currently use alcohol. He reports that he does not currently use drugs.The patient is alone today.  Allergies: No Known Allergies  Current Medications: Current Outpatient Medications  Medication Sig Dispense Refill   albuterol  (VENTOLIN  HFA) 108 (90 Base) MCG/ACT inhaler Inhale 2 puffs into the lungs every 6 (six) hours as needed.     amiodarone  (PACERONE ) 200 MG tablet TAKE 1/2 TABLET EVERY DAY 45 tablet 3   apixaban  (ELIQUIS ) 5 MG TABS tablet TAKE 1 TABLET (5 MG TOTAL) BY MOUTH 2 (TWO) TIMES DAILY. 180 tablet 1   atorvastatin  (LIPITOR ) 80 MG tablet TAKE 1 TABLET (80 MG TOTAL) BY MOUTH AT BEDTIME. 90 tablet 3   dapagliflozin  propanediol (FARXIGA ) 10 MG TABS tablet Take 1 tablet (10 mg total) by mouth daily before breakfast. 90 tablet 3   magnesium  oxide (MAG-OX) 400 (240 Mg) MG tablet TAKE 1 TABLET BY MOUTH TWICE A DAY 180 tablet 1   metFORMIN  (GLUCOPHAGE ) 500 MG tablet Take 500 mg by mouth 2 (two) times daily.     metolazone  (ZAROXOLYN ) 2.5 MG tablet Take 1 tablet (2.5 mg total) by mouth as needed. Take only directed to by the heart  failure clinic 10 tablet 0   metoprolol  succinate (TOPROL -XL) 25 MG 24 hr tablet TAKE 0.5 TABLET (12.5 MG TOTAL) BY MOUTH DAILY. 45 tablet 3   ondansetron  (ZOFRAN -ODT) 4 MG disintegrating tablet Take 4 mg by mouth every 8 (eight) hours as needed.     potassium chloride  SA (KLOR-CON  M) 20 MEQ tablet Take 1 tablet (20 mEq total) by mouth daily. 38 tablet 8   spironolactone  (ALDACTONE ) 25 MG tablet Take 0.5 tablets (12.5 mg total) by mouth daily. 15 tablet 11   SYMBICORT 80-4.5 MCG/ACT inhaler Inhale 2 puffs into the lungs 2 (two) times daily.     torsemide  (DEMADEX ) 20 MG tablet Take 2 tablets (40 mg total) by mouth daily. 180 tablet 3   TRUEplus Lancets 33G MISC      No current facility-administered medications for this visit.    I,Jasmine M Lassiter,acting as a scribe for Kevin Baumgartner, MD.,have documented all relevant documentation on the behalf of Kevin Baumgartner, MD,as directed by  Kevin Baumgartner, MD while in the presence of Kevin Baumgartner, MD.

## 2024-02-19 NOTE — Telephone Encounter (Signed)
 Patient has been scheduled for follow-up visit per 02/19/24 LOS.  Pt given an appt calendar with date and time.

## 2024-02-23 ENCOUNTER — Telehealth: Payer: Self-pay

## 2024-02-23 DIAGNOSIS — I5023 Acute on chronic systolic (congestive) heart failure: Secondary | ICD-10-CM

## 2024-02-23 NOTE — Telephone Encounter (Signed)
 He is also on Eliquis and multiple medications and his total deductables for the year will be met within this month or next and his medications may not cost anything after that. Please have pharmacy look into this and provide patient about split payment on a monthly fashion

## 2024-02-23 NOTE — Telephone Encounter (Signed)
 While coordinating patients device clinic appointments, his daughter requests that I send a notification to Dr. Micheal Agent.   Currently he cannot afford the [place] and wants to know if she can come by and pick up samples.  He is trying to get Medicaid set up.  Currently the Kevin [name] would cost him close to 900.00/mth which they cannot afford.   Please call daughter back and let her know if he can get samples or what can be done.

## 2024-02-24 ENCOUNTER — Other Ambulatory Visit (HOSPITAL_COMMUNITY): Payer: Self-pay

## 2024-02-24 ENCOUNTER — Encounter: Payer: Self-pay | Admitting: Hematology and Oncology

## 2024-02-24 ENCOUNTER — Telehealth: Payer: Self-pay | Admitting: Pharmacy Technician

## 2024-02-24 MED ORDER — DAPAGLIFLOZIN PROPANEDIOL 10 MG PO TABS: 10.0000 mg | ORAL_TABLET | Freq: Every day | ORAL | 3 refills | Status: AC

## 2024-02-24 NOTE — Telephone Encounter (Signed)
 Patient Advocate Encounter   The patient was approved for a [employer] grant that will help cover the cost of Farxiga Total amount awarded, [phone].  Effective: 01/25/24 - 01/23/25   ZOX:[id number] WUJ:[id number] WGNFA:[id number] QI:[id number]  [employer] ID: [id number]   Pharmacy provided with approval and processing information. Patient informed via phone

## 2024-03-01 DIAGNOSIS — J449 Chronic obstructive pulmonary disease, unspecified: Secondary | ICD-10-CM | POA: Diagnosis not present

## 2024-03-01 DIAGNOSIS — I5040 Unspecified combined systolic (congestive) and diastolic (congestive) heart failure: Secondary | ICD-10-CM | POA: Diagnosis not present

## 2024-03-01 DIAGNOSIS — J9611 Chronic respiratory failure with hypoxia: Secondary | ICD-10-CM | POA: Diagnosis not present

## 2024-03-02 ENCOUNTER — Encounter: Payer: Self-pay | Admitting: Hematology and Oncology

## 2024-03-03 ENCOUNTER — Ambulatory Visit: Attending: Cardiovascular Disease

## 2024-03-03 DIAGNOSIS — I447 Left bundle-branch block, unspecified: Secondary | ICD-10-CM | POA: Diagnosis not present

## 2024-03-03 DIAGNOSIS — I5022 Chronic systolic (congestive) heart failure: Secondary | ICD-10-CM | POA: Diagnosis not present

## 2024-03-03 LAB — CUP PACEART INCLINIC DEVICE CHECK
Date Time Interrogation Session: 20250424141357
Implantable Lead Connection Status: 753985
Implantable Lead Connection Status: 753985
Implantable Lead Implant Date: 20150709
Implantable Lead Implant Date: 20150709
Implantable Lead Location: 753859
Implantable Lead Location: 753860
Implantable Pulse Generator Implant Date: 20150709
Pulse Gen Serial Number: 7211894

## 2024-03-03 NOTE — Progress Notes (Signed)
 Monthly battery check.   Normal in-clinic dual chamber ICD check. Presenting Rhythm: AS/VS- 61 . Routine testing was performed. Thresholds, sensing, and impedance demonstrate stable parameters and no programming changes needed. No treated arrhythmias. Estimated longevity 4.9 months . Patient does not do remote checks. Will follow up in clinic in 1 month for a repeat battery check.   Known RA threshold prgrammed at <2:1 safety margin. Patient A-paces 1.4%.

## 2024-03-03 NOTE — Patient Instructions (Signed)
 Follow up in 1 month for a battery check.

## 2024-03-31 DIAGNOSIS — J9611 Chronic respiratory failure with hypoxia: Secondary | ICD-10-CM | POA: Diagnosis not present

## 2024-03-31 DIAGNOSIS — I5040 Unspecified combined systolic (congestive) and diastolic (congestive) heart failure: Secondary | ICD-10-CM | POA: Diagnosis not present

## 2024-03-31 DIAGNOSIS — J449 Chronic obstructive pulmonary disease, unspecified: Secondary | ICD-10-CM | POA: Diagnosis not present

## 2024-04-07 ENCOUNTER — Ambulatory Visit

## 2024-04-11 ENCOUNTER — Other Ambulatory Visit: Payer: Self-pay

## 2024-04-11 MED ORDER — MAGNESIUM OXIDE -MG SUPPLEMENT 400 (240 MG) MG PO TABS: 1.0000 | ORAL_TABLET | Freq: Two times a day (BID) | ORAL | 1 refills | Status: AC

## 2024-04-19 ENCOUNTER — Ambulatory Visit: Attending: Cardiology

## 2024-04-19 DIAGNOSIS — I255 Ischemic cardiomyopathy: Secondary | ICD-10-CM

## 2024-04-19 NOTE — Progress Notes (Signed)
 ERI reached on 04/14/24. ROV for 05/10/24 w/ Creighton Doffing, NP to discuss gen change.

## 2024-04-20 DIAGNOSIS — J449 Chronic obstructive pulmonary disease, unspecified: Secondary | ICD-10-CM | POA: Diagnosis not present

## 2024-04-20 DIAGNOSIS — I4891 Unspecified atrial fibrillation: Secondary | ICD-10-CM | POA: Diagnosis not present

## 2024-04-20 DIAGNOSIS — J9611 Chronic respiratory failure with hypoxia: Secondary | ICD-10-CM | POA: Diagnosis not present

## 2024-04-20 DIAGNOSIS — I2721 Secondary pulmonary arterial hypertension: Secondary | ICD-10-CM | POA: Diagnosis not present

## 2024-04-20 DIAGNOSIS — I10 Essential (primary) hypertension: Secondary | ICD-10-CM | POA: Diagnosis not present

## 2024-04-20 DIAGNOSIS — E1129 Type 2 diabetes mellitus with other diabetic kidney complication: Secondary | ICD-10-CM | POA: Diagnosis not present

## 2024-04-20 DIAGNOSIS — Z85820 Personal history of malignant melanoma of skin: Secondary | ICD-10-CM | POA: Diagnosis not present

## 2024-04-20 DIAGNOSIS — R809 Proteinuria, unspecified: Secondary | ICD-10-CM | POA: Diagnosis not present

## 2024-04-20 DIAGNOSIS — I5040 Unspecified combined systolic (congestive) and diastolic (congestive) heart failure: Secondary | ICD-10-CM | POA: Diagnosis not present

## 2024-04-21 ENCOUNTER — Other Ambulatory Visit (HOSPITAL_COMMUNITY): Payer: Self-pay | Admitting: Internal Medicine

## 2024-05-01 DIAGNOSIS — I5040 Unspecified combined systolic (congestive) and diastolic (congestive) heart failure: Secondary | ICD-10-CM | POA: Diagnosis not present

## 2024-05-01 DIAGNOSIS — J9611 Chronic respiratory failure with hypoxia: Secondary | ICD-10-CM | POA: Diagnosis not present

## 2024-05-01 DIAGNOSIS — J449 Chronic obstructive pulmonary disease, unspecified: Secondary | ICD-10-CM | POA: Diagnosis not present

## 2024-05-06 ENCOUNTER — Ambulatory Visit (HOSPITAL_COMMUNITY)
Admission: RE | Admit: 2024-05-06 | Discharge: 2024-05-06 | Disposition: A | Discharge status: Home / Self Care | Source: Ambulatory Visit | Attending: Internal Medicine | Admitting: Internal Medicine

## 2024-05-06 VITALS — BP 110/58 | HR 63 | Wt 145.4 lb

## 2024-05-06 DIAGNOSIS — I361 Nonrheumatic tricuspid (valve) insufficiency: Secondary | ICD-10-CM | POA: Insufficient documentation

## 2024-05-06 DIAGNOSIS — E1122 Type 2 diabetes mellitus with diabetic chronic kidney disease: Secondary | ICD-10-CM | POA: Insufficient documentation

## 2024-05-06 DIAGNOSIS — F17210 Nicotine dependence, cigarettes, uncomplicated: Secondary | ICD-10-CM | POA: Insufficient documentation

## 2024-05-06 DIAGNOSIS — I5022 Chronic systolic (congestive) heart failure: Secondary | ICD-10-CM | POA: Diagnosis not present

## 2024-05-06 DIAGNOSIS — I340 Nonrheumatic mitral (valve) insufficiency: Secondary | ICD-10-CM | POA: Diagnosis not present

## 2024-05-06 DIAGNOSIS — Z7984 Long term (current) use of oral hypoglycemic drugs: Secondary | ICD-10-CM | POA: Diagnosis not present

## 2024-05-06 DIAGNOSIS — I428 Other cardiomyopathies: Secondary | ICD-10-CM | POA: Diagnosis not present

## 2024-05-06 DIAGNOSIS — Z4502 Encounter for adjustment and management of automatic implantable cardiac defibrillator: Secondary | ICD-10-CM | POA: Insufficient documentation

## 2024-05-06 DIAGNOSIS — Z7901 Long term (current) use of anticoagulants: Secondary | ICD-10-CM | POA: Insufficient documentation

## 2024-05-06 DIAGNOSIS — Z720 Tobacco use: Secondary | ICD-10-CM

## 2024-05-06 DIAGNOSIS — Z79899 Other long term (current) drug therapy: Secondary | ICD-10-CM | POA: Diagnosis not present

## 2024-05-06 DIAGNOSIS — I480 Paroxysmal atrial fibrillation: Secondary | ICD-10-CM | POA: Insufficient documentation

## 2024-05-06 DIAGNOSIS — N183 Chronic kidney disease, stage 3 unspecified: Secondary | ICD-10-CM

## 2024-05-06 DIAGNOSIS — I5084 End stage heart failure: Secondary | ICD-10-CM | POA: Insufficient documentation

## 2024-05-06 DIAGNOSIS — I272 Pulmonary hypertension, unspecified: Secondary | ICD-10-CM | POA: Diagnosis not present

## 2024-05-06 DIAGNOSIS — J449 Chronic obstructive pulmonary disease, unspecified: Secondary | ICD-10-CM | POA: Insufficient documentation

## 2024-05-06 DIAGNOSIS — I447 Left bundle-branch block, unspecified: Secondary | ICD-10-CM

## 2024-05-06 DIAGNOSIS — I130 Hypertensive heart and chronic kidney disease with heart failure and stage 1 through stage 4 chronic kidney disease, or unspecified chronic kidney disease: Secondary | ICD-10-CM | POA: Insufficient documentation

## 2024-05-06 DIAGNOSIS — N1832 Chronic kidney disease, stage 3b: Secondary | ICD-10-CM | POA: Diagnosis not present

## 2024-05-06 DIAGNOSIS — I5023 Acute on chronic systolic (congestive) heart failure: Secondary | ICD-10-CM | POA: Diagnosis not present

## 2024-05-06 DIAGNOSIS — Z955 Presence of coronary angioplasty implant and graft: Secondary | ICD-10-CM | POA: Diagnosis not present

## 2024-05-06 DIAGNOSIS — I2510 Atherosclerotic heart disease of native coronary artery without angina pectoris: Secondary | ICD-10-CM | POA: Diagnosis not present

## 2024-05-06 LAB — BASIC METABOLIC PANEL WITH GFR
Anion gap: 10 (ref 5–15)
BUN: 47 mg/dL — ABNORMAL HIGH (ref 8–23)
CO2: 28 mmol/L (ref 22–32)
Calcium: 9.5 mg/dL (ref 8.9–10.3)
Chloride: 101 mmol/L (ref 98–111)
Creatinine, Ser: 2.06 mg/dL — ABNORMAL HIGH (ref 0.61–1.24)
GFR, Estimated: 32 mL/min — ABNORMAL LOW (ref >60)
Glucose, Bld: 189 mg/dL — ABNORMAL HIGH (ref 70–99)
Potassium: 4.2 mmol/L (ref 3.5–5.1)
Sodium: 139 mmol/L (ref 135–145)

## 2024-05-06 LAB — BRAIN NATRIURETIC PEPTIDE: B Natriuretic Peptide: 1917.4 pg/mL — ABNORMAL HIGH (ref 0.0–100.0)

## 2024-05-06 NOTE — Patient Instructions (Signed)
 Labs done today. We will contact you only if your labs are abnormal.  No medication changes were made. Please continue all current medications as prescribed.  Your physician recommends that you schedule a follow-up appointment in: 6 months with an echo prior to your appointment. Please contact our office in October to schedule a December appointment.   If you have any questions or concerns before your next appointment please send us  a message through [facility] or call our office at [phone].    TO LEAVE A MESSAGE FOR THE NURSE SELECT OPTION 2, PLEASE LEAVE A MESSAGE INCLUDING: YOUR NAME DATE OF BIRTH CALL BACK NUMBER REASON FOR CALL**this is important as we prioritize the call backs  YOU WILL RECEIVE A CALL BACK THE SAME DAY AS LONG AS YOU CALL BEFORE 4:00 PM   Do the following things EVERYDAY: Weigh yourself in the morning before breakfast. Write it down and keep it in a log. Take your medicines as prescribed Eat low salt foods--Limit salt (sodium) to 2000 mg per day.  Stay as active as you can everyday Limit all fluids for the day to less than 2 liters   At the [facility], you and your health needs are our priority. As part of our continuing mission to provide you with exceptional heart care, we have created designated Provider Care Teams. These Care Teams include your primary Cardiologist (physician) and Advanced Practice Providers (APPs- Physician Assistants and Nurse Practitioners) who all work together to provide you with the care you need, when you need it.   You may see any of the following providers on your designated Care Team at your next follow up: Dr Toribio Fuel Dr Ezra Shuck Dr. Ria Gardenia Greig Lenetta, NP Caffie Shed, [place] [facility], [place] Beckey Coe, NP Tinnie Redman, PharmD   Please be sure to bring in all your medications bottles to every appointment.    Thank you for choosing [facility]

## 2024-05-06 NOTE — Progress Notes (Signed)
 ADVANCED HF CLINIC NOTE  Primary Care: Montey Lot, PA-C Primary Cardiologist: Gordy Bergamo, MD HF Cardiologist: Dr. Cherrie  Reason for Visit: f/u for chronic systolic heart failure   HPI: Kevin [name] is an 83 y.o. 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 Barbaraann for further evaluation of his HF   Diagnosed with HF 01/2010 at [facility]. Cath at that time showed EF 25-35%, pRCA CTO otherwise ok. Had cath in 2015 with stent to LAD.   Was followed in [place] for several years then transferred to [facility].   Cath 11/22: LM: Normal LAD: Patent mid LAD stent. No significant disease Lcx: No significant disease RCA: Ostial 100% occlusion. Left-to-right collateral up to mid RCA RA 11 PA 64/22 34 PCW 23 mmHg Fick 3.6/2.0  Echo 6/24 at [place]: EF < 20% RV said to be normal, G2DD, severe biatrial enlargement, mod-severe MR, severe TR. RVSP 73 mmHG  Follow up 11/17/23 with Dr. Cherrie, struggling with advanced HF symptoms and poor tolerance of GDMT. SCr climbing. Volume up with NYHA IIIb symptoms. Felt to be end-stage HF. Torsemide  increased to 40 mg daily.  Seen last wk for f/u. Had ran out of torsemide  x 1 month, subsequently volume overloaded w/ LEE and symptomatic w/ exertional dyspnea and orthopnea, sleeping on 3 pillows. CorVue also elevated and wt was up 19 lb. He was given 3 doses of Furoscix  and instructed to restart torsemide  after completion.   Returns back for f/u. Here w/ his daughter.  Says he feels good. Doesn't move fast but can do ADLs. Can go to the mailbox. No CP or undue SOB. Tolerating meds well. ICD at EOL. Debating whether or not it should be replaced  Device interrogation done personally No VT AF burden < 1% AP 21% VP 20%Volume ok.      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     amiodarone  (PACERONE ) 200 MG tablet TAKE 1/2 TABLET EVERY DAY 45 tablet 3   apixaban  (ELIQUIS ) 5 MG TABS tablet TAKE 1 TABLET (5 MG TOTAL) BY MOUTH 2 (TWO) TIMES DAILY. 180 tablet 1   atorvastatin  (LIPITOR ) 80 MG tablet TAKE 1 TABLET (80 MG TOTAL) BY MOUTH AT BEDTIME. 90 tablet 3   dapagliflozin  propanediol (FARXIGA ) 10 MG TABS tablet Take 1 tablet (10 mg total) by mouth daily before breakfast. 90 tablet 3   magnesium  oxide (MAG-OX) 400 (240 Mg) MG tablet Take 1 tablet (400 mg total) by mouth 2 (two) times daily. 180 tablet 1   metFORMIN  (GLUCOPHAGE )  500 MG tablet Take 500 mg by mouth 2 (two) times daily.     metolazone  (ZAROXOLYN ) 2.5 MG tablet Take 1 tablet (2.5 mg total) by mouth as needed. Take only directed to by the heart failure clinic 10 tablet 0   metoprolol  succinate (TOPROL -XL) 25 MG 24 hr tablet TAKE 0.5 TABLET (12.5 MG TOTAL) BY MOUTH DAILY. 45 tablet 3   ondansetron  (ZOFRAN -ODT) 4 MG disintegrating tablet Take 4 mg by mouth every 8 (eight) hours as needed.     potassium chloride  SA (KLOR-CON  M) 20 MEQ tablet Take 1 tablet (20 mEq total) by mouth daily. 38 tablet 8    spironolactone  (ALDACTONE ) 25 MG tablet Take 0.5 tablets (12.5 mg total) by mouth daily. 15 tablet 11   SYMBICORT 80-4.5 MCG/ACT inhaler Inhale 2 puffs into the lungs 2 (two) times daily.     torsemide  (DEMADEX ) 20 MG tablet TAKE 2 TABLETS (40 MG TOTAL) BY MOUTH DAILY. 180 tablet 1   TRUEplus Lancets 33G MISC      No current facility-administered medications for this encounter.   No Known Allergies  Social History   Socioeconomic History   Marital status: Married    Spouse name: Not on file   Number of children: 2   Years of education: Not on file   Highest education level: Not on file  Occupational History   Not on file  Tobacco Use   Smoking status: Some Days    Current packs/day: 0.50    Average packs/day: 0.5 packs/day for 60.0 years (30.0 ttl pk-yrs)    Types: Cigarettes   Smokeless tobacco: Never  Vaping Use   Vaping status: Never Used  Substance and Sexual Activity   Alcohol use: Not Currently   Drug use: Not Currently   Sexual activity: Not Currently  Other Topics Concern   Not on file  Social History Narrative   Not on file   Social Drivers of Health   Financial Resource Strain: Low Risk  (05/07/2021)   Overall Financial Resource Strain (CARDIA)    Difficulty of Paying Living Expenses: Not hard at all  Food Insecurity: No Food Insecurity (05/07/2021)   Hunger Vital Sign    Worried About Running Out of Food in the Last Year: Never true    Ran Out of Food in the Last Year: Never true  Transportation Needs: No Transportation Needs (05/07/2021)   PRAPARE - Administrator, Civil Service (Medical): No    Lack of Transportation (Non-Medical): No  Physical Activity: Inactive (05/07/2021)   Exercise Vital Sign    Days of Exercise per Week: 0 days    Minutes of Exercise per Session: 0 min  Stress: No Stress Concern Present (05/07/2021)   [employer] of Occupational Health - Occupational Stress Questionnaire    Feeling of Stress : Not at all  Social  Connections: Socially Isolated (05/07/2021)   Social Connection and Isolation Panel    Frequency of Communication with Friends and Family: More than three times a week    Frequency of Social Gatherings with Friends and Family: More than three times a week    Attends Religious Services: Never    Database administrator or Organizations: No    Attends Banker Meetings: Never    Marital Status: Widowed  Intimate Partner Violence: Not At Risk (05/07/2021)   Humiliation, Afraid, Rape, and Kick questionnaire    Fear of Current or Ex-Partner: No    Emotionally Abused: No    Physically  Abused: No    Sexually Abused: No   Family History  Problem Relation Age of Onset   Breast cancer Mother    Diabetes Mellitus II Father    Ovarian cancer Sister    Cancer Brother    Cancer Brother    Cancer Maternal Uncle    Breast cancer Niece    Wt Readings from Last 3 Encounters:  05/06/24 66 kg (145 lb 6.4 oz)  02/19/24 69.9 kg (154 lb)  02/02/24 71.6 kg (157 lb 12.8 oz)   BP (!) 110/58   Pulse 63   Wt 66 kg (145 lb 6.4 oz)   SpO2 97%   BMI 23.47 kg/m   PHYSICAL EXAM: General:  Elderly thin. No resp difficulty HEENT: normal Neck: supple. no JVD. Carotids 2+ bilat; no bruits. No lymphadenopathy or thryomegaly appreciated. Cor: PMI nondisplaced. Regular rate & rhythm. No rubs, gallops or murmurs. Lungs: clear but decreased Abdomen: soft, nontender, nondistended. No hepatosplenomegaly. No bruits or masses. Good bowel sounds. Extremities: no cyanosis, clubbing, rash, edema Neuro: alert & orientedx3, cranial nerves grossly intact. moves all 4 extremities w/o difficulty. Affect pleasant   Device interrogation (personally reviewed): As per HPI   ASSESSMENT & PLAN:  1. 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 Had cath in 2015 with stent to LAD.  - Echo 6/24 at [facility]: LVEF < 20% RV said  to be normal. G2DD. Severe biatrial enlargement. Mod-severe MR Severe TR. RVSP - Cath 11/22 LAD stent patent. RCA CTO with L-R collats RA 11 PA 64/22 34 PCW 23 mmHg Fick 3.6/2.0 - s/p ICD - Stable NYHA II-III confounded by age/deconditioning  - Volume looks good - continue torsemide  40 mg daily  - Continue Farxiga  10 mg daily. - Continue spironolactone  25 mg daily. - Continue Toprol  XL 12.5 mg daily. - BP too low to titrate meds or add ARB - End-stage HF and lung disease. Not candidate for advanced therapies  - Has ICD. Suspect a component of LBBB CM, but likely too late to benefit from CRT.  - He is at [facility]. Long talk about situation. Would not replace device - ICD interrogated personally - Labs today  2. Paroxysmal Atrial Fibrillation - Regualr today. AF burden < 1% on IC - No AF on device interrogation. Burden < 1%  - Continue amiodarone  100 mg daily.  - Continue Eliquis  5 mg bid.  3. CAD - Last cath 11/22 as above with stable CAD -  no s/s angina - Continue statin - No ASA, with Eliquis      4. LBBB - Longstanding - Not interested in CRT  5. CKD 3b - Baseline SCr 1.7-1.8 - Continue SGLT2i. - labs today  6. Severe COPD with ongoing tobacco use  - Smoking 1/2 ppd - no interested in quitting   F/u 6 months with echo  Kevin Fuel, MD  12:31 PM

## 2024-05-06 NOTE — Addendum Note (Signed)
 Encounter addended by: Alvan Fausto SAUNDERS, CMA on: 05/06/2024 12:46 PM  Actions taken: Order list changed, Diagnosis association updated, Clinical Note Signed, Charge Capture section accepted

## 2024-05-09 NOTE — Progress Notes (Signed)
 This encounter was created in error - please disregard.

## 2024-05-10 ENCOUNTER — Ambulatory Visit: Attending: Pulmonary Disease | Admitting: Pulmonary Disease

## 2024-05-11 ENCOUNTER — Encounter: Payer: Self-pay | Admitting: Pulmonary Disease

## 2024-05-11 ENCOUNTER — Ambulatory Visit: Payer: Self-pay | Admitting: *Deleted

## 2024-05-18 ENCOUNTER — Encounter: Admitting: Pulmonary Disease

## 2024-05-20 ENCOUNTER — Inpatient Hospital Stay

## 2024-05-20 ENCOUNTER — Inpatient Hospital Stay: Admitting: Oncology

## 2024-05-20 ENCOUNTER — Other Ambulatory Visit: Payer: Self-pay | Admitting: Oncology

## 2024-05-20 DIAGNOSIS — D519 Vitamin B12 deficiency anemia, unspecified: Secondary | ICD-10-CM

## 2024-05-20 NOTE — Progress Notes (Incomplete)
 [facility]  [address] 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  He had a hypermetabolic right submandibular node on PET, but no evidence of distant metastasis.  His original lesion was diagnosed in March 2016.  He underwent excision, but refused aggressive therapy.  He has been receiving palliative nivolumab  since November 2016, with several interruptions. His treatment was on hold from December 2022 to April 2023 due to cardiorespiratory issues. CT imaging in August 2023 remains without evidence of metastatic disease.  However, there were increasing bilateral pleural effusions and small volume abdominal pelvic ascites, with stable pericardial effusion, felt to be most likely due to fluid overload.  His heart failure is managed by cardiology. He was on immunotherapy for years and did well but developed multiple issues in 2024 including CHF and diarrhea, which was possibly related to the immunotherapy. However, he did not require steroids and this has resolved. We therefore stopped his treatment in 2024, he had CT scans in October of that year which remain stable. He continues to do well.   B12 deficiency anemia His B12 injections have been given sporadically as well.  Repeat B12 level in April was normal.  He last received B12 on June 5. His hemoglobin has dropped, so I will repeat B12 and folate, as well as iron studies. His hemoglobin came up from 11.2 to 12.4.    Iron deficiency anemia He was found to have iron deficiency in July 2023.  He was given IV iron at that time.  His hemoglobin had dropped again in April.  Iron studies in April revealed recurrent iron deficiency, so he received a dose of Feraheme  on June 5.     Plan: I find no evidence of recurrent melanoma.  He is being followed regularly by his cardiologist. Patient informed me that he missed 3 days of his medications. He has a WBC of 4.6, low hemoglobin of 12.4 improved from 11.2, and platelet count of 152. His CMP is pending and I will call him with the results. I will see him back in 3 months with CBC and CMP. We may consider repeat scans in the fall. The patient understands the plans discussed today and is in agreement with them.  He knows to contact our office if he develops concerns prior to his next appointment.  I provided 9 minutes of face-to-face time during this this encounter and > 50% was spent counseling as documented under my assessment and plan.   Wanda VEAR Cornish, MD [facility] A DEPT OF [facility] [address] [place] [address] Dept: [phone] Dept Fax: [phone]   No orders of the defined types were placed in this encounter.    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  His original lesion was diagnosed in March 2016.  He underwent excision, but refused aggressive surgery.  The lesion was at least 4 mm thick with a Clark's level 4, and a mitotic index 10/mm.  The margins were positive, but wide reexcision revealed no residual melanoma.  He had recurrence within 2 months and the lesion had been steadily growing.  He initially had refused seeing a medical oncologist, but finally came to us  for evaluation and treatment in October 2016.  He does have extensive comorbidities including COPD, diabetes, hypertension, coronary artery disease, history of myocardial infarction, and pacemaker with AICD.  PET revealed hypermetabolism in the right submandibular node, but no evidence of distant metastasis.  He was having significant pain and requiring regular hydrocodone  doses.  He also had severe disfigurement and avoided going out in public because of the facial lesion.  He has been receiving palliative nivolumab  since November 2016 and has had a good response with a decrease in the right facial tumor.  He has had associated hypomagnesemia and hypokalemia, so is on oral magnesium  and potassium supplements. He has had mild chronic anemia, which has been stable. He was found to have B12 deficiency and continues B12 injections monthly.  CT head/neck in May 2017 revealed some nonspecific skin thickening in the right face in the area of his melanoma in the right submandibular lymph node had decreased in size, now measuring 6 x 12 mm, but still prominent when compared to the left submandibular node.  He had 15 cycles of nivolumab  and relocated to continue to get the same treatment.  He had his 30th dose in February 2018 and then returned to the [place] area.     He was admitted to the hospital in early March 2018 for congestive heart failure associated with pleural and pericardial effusions.  He had no leukocytosis, fever or purulent sputum.  The echocardiogram revealed severe diffuse hypokinesis with mild concentric hypertrophy and markedly depressed ejection fraction of 20 to 25%, associated with moderate mitral regurgitation and moderate left atrial dilatation.  He improved with diuresis.  CT chest in March 2018 revealed stable cardiomegaly and stable small pericardial effusion, but no evidence of immune mediated pneumonitis or metastatic disease.  The previously seen bilateral pleural effusions had resolved.  He returned to our care and nivolumab  every 2 weeks was resumed in April.  CT chest was repeated in early May after 1 month back on therapy.  This was stable, without evidence of recurrent pleural effusions, immunotherapy toxicities, or evidence of metastatic disease.  He was switched to every 28-day nivolumab  in August 2018.  He has had stable disease so has continued on  nivolumab .  CT imaging in October 2018 revealed a stable 5 mm level 1B cervical lymph node, as well as a stable 8 mm exophytic lesion of the right face.  CT chest did not reveal any evidence of metastatic disease. Borderline cardiomegaly and a small amount of pericardial fluid/thickening was seen, but not reaccumulation of the pleural effusions.  CT head did not reveal any evidence of intracranial metastasis.  There were chronic ischemic changes and evidence of old infarcts.   Unfortunately, his wife passed away in 08-19-19and she was his primary caregiver His daughters have been caring for him since then.  CT scans have remained stable, so he has continued nivolumab  every 4 weeks.  He was given mirtazapine for depression and weight loss the dose was increased to 15 mg.  He was then lost to follow-up from December 2021 to April 2022, at which time he resumed nivolumab  and B12 every 4 weeks.   CT head, neck and chest in June 2022 did not reveal any evidence of progressive disease.  He was admitted at [facility] in November/December 2022 due to flu and pneumonia.  Nivolumab  was placed on hold in December.  CT chest,  abdomen and pelvis in February 2023 did not reveal any evidence of metastatic disease.  There was new extensive tree in bud nodularity in the right lung with most significant involvement of the right lower lobe. Findings were most consistent with an infectious or inflammatory process in a pattern suggestive of atypical infection including nontuberculous mycobacterium.  He was seen by Dr. Mardee, who recommended bronchoscopy, but the patient did not return for follow-up as he did not wish to pursue this procedure. Nivolumab  was resumed again in April.  CT scans in August 2023 did not reveal obvious metastatic disease.  There was an increase in his bilateral pleural effusions, stable pericardial effusion and mild ascites, felt to most likely be due to fluid overload.  He is on Entresto  and follows with  cardiology.    He received a 22nd cycle of nivolumab  in March.  His 23rd cycle was delayed due to C. difficile in April.  CT face, chest, abdomen and pelvis in May not reveal any definitive signs of recurrent or metastatic disease.  There was unchanged mild chronic skin thickening in the right infraorbital/pre-maxillary region, at site of the patient's treated melanoma. No CT findings to suggest recurrent tumor at this site. No lymphadenopathy within the imaged neck. Small 6 mm nodule in the RIGHT lower lobe could represent focal inflammation or atelectasis. Findings of suspected heart failure with effusions and pulmonary edema. Pulmonary edema is new compared to previous imaging. RIGHT pleural effusion is slightly diminished. Patchy ground-glass may also reflect a component of edema. Cardiomegaly and signs of coronary artery disease/PTC with stable moderate pericardial effusion. Anasarca with body wall edema and ascites. Mild skin thickening over the anterior abdominal wall and in the gluteal cleft similar to prior imaging.  He saw his cardiologist and underwent echocardiogram on June 4, which revealed left ventricle cavity is severely dilated. Mild concentric hypertrophy of the left ventricle. Severe global hypokinesis. LVEF <20%. No obvious LV thrombus noted on this noncontrast study.  Doppler evidence of grade II (pseudonormal) diastolic dysfunction, elevated LAP. Device lead seen in RA/RV. Left atrial cavity is severely dilated. Right atrial cavity is severely dilated. Native trileaflet aortic valve. No evidence of aortic stenosis. Mild to moderate aortic regurgitation. Moderate to severe mitral regurgitation. Severe tricuspid regurgitation. Pulmonary hypertension. Estimated pulmonary artery systolic pressure 73 mmHg. Small to moderate circumferential pericardial effusion. Previous study in February 2023 reported mild to moderate tricuspid regurgitation, estimated PASP 55 mmHg. Dr. Cornelius recommended  follow-up CT imaging in 6 months.  His last CT scan in October 2024 remains clear and his nivolumab  was stopped.  He is on surveillance only.  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 (HCC)  01/24/2015 Cancer Staging   Staging form: Melanoma of the Skin, AJCC 8th Edition - Clinical stage from 01/24/2015: Stage IIB (cT3b, cN0, cM0) - Signed by Cornelius Wanda DEL, MD on 08/19/2021 Histopathologic type: Malignant melanoma, NOS (except juvenile melanoma [id number]) Stage prefix: Initial diagnosis Laterality: Right Lymph-vascular invasion (LVI): LVI not present (absent)/not identified Diagnostic confirmation: Positive histology Specimen type: Excision Staged by: Managing physician Mitotic count: 10 Mitotic unit: mm2 Clark's level: Level IV Tumor-infiltrating lymphocytes: Unknown Neurotropism: Absent Presence of extranodal extension: Absent Breslow depth (mm): 40 Ulceration of the epidermis: Yes Microsatellites: No Primary tumor regression: Unknown Sentinel lymph node biopsy performed: No Matted nodes: No Prognostic indicators: Wide excision negative Stage used in treatment planning: Yes National guidelines used in treatment planning: Yes Type of national guideline used in treatment planning:  NCCN   07/18/2020 - 06/18/2022 Chemotherapy   Patient is on Treatment Plan : MELANOMA Nivolumab  + B12 q28d     07/18/2020 - 04/15/2023 Chemotherapy   Patient is on Treatment Plan : MELANOMA Nivolumab  (480) q28d     07/29/2020 Initial Diagnosis   Malignant melanoma of other parts of face (HCC)       INTERVAL HISTORY:  Kevin [name] is here today for repeat clinical assessment for his metastatic melanoma. He was treated with immunotherapy for years but this was stopped last year and he continues to do well off treatment. Patient states that he feels *** and ***.       He denies fever, chills, night sweats, or other signs of infection. He denies cardiorespiratory and gastrointestinal  issues. He  denies pain. His appetite is *** and His weight {Weight change:10426}.  Patient states that he feels well and has no complaints of pain. He is being followed regularly by his cardiologist. Patient informed me that he missed 3 days of his medications. He has a WBC of 4.6, low hemoglobin of 12.4 improved from 11.2, and platelet count of 152. His CMP is pending and I will call him with the results. I will see him back in 3 months with CBC and CMP. We may consider repeat scans in the fall. He denies signs of infection such as sore throat, sinus drainage, cough, or urinary symptoms.  He denies fevers or recurrent chills. He denies pain. He denies nausea, vomiting, chest pain, dyspnea or cough. His appetite is good and his weight has increased 16 pounds over last 6 months.   REVIEW OF SYSTEMS:  Review of Systems  Constitutional:  Positive for fatigue. Negative for appetite change, chills, diaphoresis, fever and unexpected weight change.  HENT:  Negative.  Negative for hearing loss, lump/mass, mouth sores, nosebleeds, sore throat, tinnitus, trouble swallowing and voice change.   Eyes: Negative.  Negative for eye problems and icterus.  Respiratory: Negative.  Negative for chest tightness, cough, hemoptysis, shortness of breath and wheezing.   Cardiovascular: Negative.  Negative for chest pain, leg swelling and palpitations.  Gastrointestinal: Negative.  Negative for abdominal distention, abdominal pain, blood in stool, constipation, diarrhea, nausea, rectal pain and vomiting.  Endocrine: Negative.  Negative for hot flashes.  Genitourinary: Negative.  Negative for bladder incontinence, difficulty urinating, dyspareunia, dysuria, frequency, hematuria, nocturia, pelvic pain and penile discharge.   Musculoskeletal:  Positive for arthralgias. Negative for back pain, flank pain, gait problem, myalgias, neck pain and neck stiffness.  Skin: Negative.  Negative for itching, rash and wound.  Neurological:  Negative.  Negative for dizziness, extremity weakness, gait problem, headaches, light-headedness, numbness, seizures and speech difficulty.  Hematological: Negative.  Negative for adenopathy. Does not bruise/bleed easily.  Psychiatric/Behavioral: Negative.  Negative for confusion, decreased concentration, depression, sleep disturbance and suicidal ideas. The patient is not nervous/anxious.     VITALS:  There were no vitals taken for this visit.  Wt Readings from Last 3 Encounters:  05/06/24 145 lb 6.4 oz (66 kg)  02/19/24 154 lb (69.9 kg)  02/02/24 157 lb 12.8 oz (71.6 kg)    There is no height or weight on file to calculate BMI.  Performance status (ECOG): 1 - Symptomatic but completely ambulatory  PHYSICAL EXAM:  Physical Exam Vitals and nursing note reviewed.  Constitutional:      General: He is not in acute distress.    Appearance: Normal appearance. He is normal weight. He is not ill-appearing, toxic-appearing or diaphoretic.  HENT:     Head: Normocephalic and atraumatic.     Right Ear: Tympanic membrane, ear canal and external ear normal. There is no impacted cerumen.     Left Ear: Tympanic membrane, ear canal and external ear normal. There is no impacted cerumen.     Nose: Nose normal. No congestion or rhinorrhea.     Mouth/Throat:     Mouth: Mucous membranes are moist.     Pharynx: Oropharynx is clear. No oropharyngeal exudate or posterior oropharyngeal erythema.  Eyes:     General: No scleral icterus.       Right eye: No discharge.        Left eye: No discharge.     Extraocular Movements: Extraocular movements intact.     Conjunctiva/sclera: Conjunctivae normal.     Pupils: Pupils are equal, round, and reactive to light.  Cardiovascular:     Rate and Rhythm: Normal rate and regular rhythm. No extrasystoles are present.    Pulses: Normal pulses.     Heart sounds: Normal heart sounds. No murmur heard.    No friction rub. No gallop.  Pulmonary:     Effort: Pulmonary  effort is normal. No respiratory distress.     Breath sounds: Normal breath sounds. No wheezing, rhonchi or rales.  Abdominal:     General: Bowel sounds are normal. There is no distension.     Palpations: Abdomen is soft. There is no mass.     Tenderness: There is no abdominal tenderness. There is no right CVA tenderness or guarding.  Musculoskeletal:        General: Normal range of motion.     Cervical back: Normal range of motion and neck supple. No tenderness.     Right lower leg: No edema.     Left lower leg: No edema.     Comments: Discoloration of the lower extremities with purplish color and minimal edema  Lymphadenopathy:     Cervical: No cervical adenopathy.     Upper Body:     Right upper body: No supraclavicular or axillary adenopathy.     Left upper body: No supraclavicular or axillary adenopathy.  Skin:    General: Skin is warm and dry.     Coloration: Skin is not jaundiced or pale.     Findings: No bruising, erythema, lesion or rash.     Comments: Persistent mild thickening of the right cheek without evidence of recurrent lesion  Neurological:     General: No focal deficit present.     Mental Status: He is alert and oriented to person, place, and time. Mental status is at baseline.     Cranial Nerves: No cranial nerve deficit.     Sensory: No sensory deficit.     Motor: No weakness.     Coordination: Coordination normal.     Gait: Gait normal.     Deep Tendon Reflexes: Reflexes normal.  Psychiatric:        Mood and Affect: Mood normal.        Behavior: Behavior normal.        Thought Content: Thought content normal.    LABS:      Latest Ref Rng & Units 02/19/2024    2:27 PM 01/25/2024    3:16 PM 11/30/2023    2:24 PM  CBC  WBC 4.0 - 10.5 K/uL 4.6  3.9  3.8   Hemoglobin 13.0 - 17.0 g/dL 87.5  88.7  89.1   Hematocrit 39.0 - 52.0 % 40.8  36.2  35.7   Platelets 150 - 400 K/uL 152  168  139       Latest Ref Rng & Units 05/06/2024   12:45 PM 02/19/2024    2:27  PM 02/02/2024    4:14 PM  CMP  Glucose 70 - 99 mg/dL 810  828  839   BUN 8 - 23 mg/dL 47  33  33   Creatinine 0.61 - 1.24 mg/dL 7.93  8.35  7.96   Sodium 135 - 145 mmol/L 139  145  138   Potassium 3.5 - 5.1 mmol/L 4.2  4.0  4.2   Chloride 98 - 111 mmol/L 101  105  102   CO2 22 - 32 mmol/L 28  27  25    Calcium  8.9 - 10.3 mg/dL 9.5  9.9  9.1   Total Protein 6.5 - 8.1 g/dL  7.9    Total Bilirubin 0.0 - 1.2 mg/dL  0.7    Alkaline Phos 38 - 126 U/L  150    AST 15 - 41 U/L  30    ALT 0 - 44 U/L  27       No results found for: CEA1, CEA / No results found for: CEA1, CEA No results found for: PSA1 No results found for: CAN199 No results found for: CAN125  No results found for: STEPHANY RINGS, A1GS, A2GS, EARLA JOANNIE KNIGHTS, MSPIKE, SPEI  Lab Results  Component Value Date   TIBC 295 02/26/2023   TIBC 337 02/23/2023   TIBC 321 09/24/2022   FERRITIN 118 02/26/2023   FERRITIN 64 02/23/2023   FERRITIN 108 09/24/2022   IRONPCTSAT 15 02/26/2023   IRONPCTSAT 11 (L) 02/23/2023   IRONPCTSAT 16 (L) 09/24/2022   Lab Results  Component Value Date   LDH 120 03/09/2023   LDH 132 02/23/2023   LDH 108 09/24/2022    STUDIES:     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)    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)    Thrombocytopenia (HCC) 07/29/2022    Past  Surgical History:  Procedure Laterality Date   APPENDECTOMY     CARDIAC CATHETERIZATION     CORONARY ANGIOPLASTY     PACEMAKER INSERTION     RIGHT/LEFT HEART CATH AND CORONARY ANGIOGRAPHY N/A 10/09/2021   Procedure: RIGHT/LEFT HEART CATH AND CORONARY ANGIOGRAPHY;  Surgeon: Elmira Newman PARAS, MD;  Location: [facility] INVASIVE CV LAB;  Service: Cardiovascular;  Laterality: N/A;    Family History  Problem Relation Age of Onset   Breast cancer Mother    Diabetes Mellitus II Father    Ovarian cancer Sister    Cancer Brother    Cancer Brother    Cancer Maternal Uncle    Breast cancer Niece     Social History:  reports that he has been smoking cigarettes. He has a 30 pack-year smoking history. He has never used smokeless tobacco. He reports that he  does not currently use alcohol. He reports that he does not currently use drugs.The patient is alone today.  Allergies: No Known Allergies  Current Medications: Current Outpatient Medications  Medication Sig Dispense Refill   albuterol  (VENTOLIN  HFA) 108 (90 Base) MCG/ACT inhaler Inhale 2 puffs into the lungs every 6 (six) hours as needed.     amiodarone  (PACERONE ) 200 MG tablet TAKE 1/2 TABLET EVERY DAY 45 tablet 3   apixaban  (ELIQUIS ) 5 MG TABS tablet TAKE 1 TABLET (5 MG TOTAL) BY MOUTH 2 (TWO) TIMES DAILY. 180 tablet 1   atorvastatin  (LIPITOR ) 80 MG tablet TAKE 1 TABLET (80 MG TOTAL) BY MOUTH AT BEDTIME. 90 tablet 3   dapagliflozin  propanediol (FARXIGA ) 10 MG TABS tablet Take 1 tablet (10 mg total) by mouth daily before breakfast. 90 tablet 3   magnesium  oxide (MAG-OX) 400 (240 Mg) MG tablet Take 1 tablet (400 mg total) by mouth 2 (two) times daily. 180 tablet 1   metFORMIN  (GLUCOPHAGE ) 500 MG tablet Take 500 mg by mouth 2 (two) times daily.     metolazone  (ZAROXOLYN ) 2.5 MG tablet Take 1 tablet (2.5 mg total) by mouth as needed. Take only directed to by the heart failure clinic 10 tablet 0   metoprolol  succinate (TOPROL -XL) 25 MG 24 hr tablet  TAKE 0.5 TABLET (12.5 MG TOTAL) BY MOUTH DAILY. 45 tablet 3   ondansetron  (ZOFRAN -ODT) 4 MG disintegrating tablet Take 4 mg by mouth every 8 (eight) hours as needed.     potassium chloride  SA (KLOR-CON  M) 20 MEQ tablet Take 1 tablet (20 mEq total) by mouth daily. 38 tablet 8   spironolactone  (ALDACTONE ) 25 MG tablet Take 0.5 tablets (12.5 mg total) by mouth daily. 15 tablet 11   SYMBICORT 80-4.5 MCG/ACT inhaler Inhale 2 puffs into the lungs 2 (two) times daily.     torsemide  (DEMADEX ) 20 MG tablet TAKE 2 TABLETS (40 MG TOTAL) BY MOUTH DAILY. 180 tablet 1   TRUEplus Lancets 33G MISC      No current facility-administered medications for this visit.    I,Jasmine M Lassiter,acting as a scribe for Wanda VEAR Cornish, MD.,have documented all relevant documentation on the behalf of Wanda VEAR Cornish, MD,as directed by  Wanda VEAR Cornish, MD while in the presence of Wanda VEAR Cornish, MD.

## 2024-05-31 DIAGNOSIS — J449 Chronic obstructive pulmonary disease, unspecified: Secondary | ICD-10-CM | POA: Diagnosis not present

## 2024-05-31 DIAGNOSIS — J9611 Chronic respiratory failure with hypoxia: Secondary | ICD-10-CM | POA: Diagnosis not present

## 2024-05-31 DIAGNOSIS — I5040 Unspecified combined systolic (congestive) and diastolic (congestive) heart failure: Secondary | ICD-10-CM | POA: Diagnosis not present

## 2024-06-03 ENCOUNTER — Ambulatory Visit: Admitting: Oncology

## 2024-06-03 ENCOUNTER — Inpatient Hospital Stay

## 2024-07-01 DIAGNOSIS — I5040 Unspecified combined systolic (congestive) and diastolic (congestive) heart failure: Secondary | ICD-10-CM | POA: Diagnosis not present

## 2024-07-01 DIAGNOSIS — J449 Chronic obstructive pulmonary disease, unspecified: Secondary | ICD-10-CM | POA: Diagnosis not present

## 2024-07-01 DIAGNOSIS — J9611 Chronic respiratory failure with hypoxia: Secondary | ICD-10-CM | POA: Diagnosis not present

## 2024-08-01 DIAGNOSIS — I5040 Unspecified combined systolic (congestive) and diastolic (congestive) heart failure: Secondary | ICD-10-CM | POA: Diagnosis not present

## 2024-08-01 DIAGNOSIS — J9611 Chronic respiratory failure with hypoxia: Secondary | ICD-10-CM | POA: Diagnosis not present

## 2024-08-01 DIAGNOSIS — J449 Chronic obstructive pulmonary disease, unspecified: Secondary | ICD-10-CM | POA: Diagnosis not present

## 2024-08-17 ENCOUNTER — Other Ambulatory Visit: Payer: Self-pay | Admitting: Cardiology

## 2024-08-17 DIAGNOSIS — I4891 Unspecified atrial fibrillation: Secondary | ICD-10-CM

## 2024-08-18 NOTE — Telephone Encounter (Signed)
 Prescription refill request for Eliquis  received. Indication:afib Last office visit:3/25 Scr:1.64  4/25 Age: 83 Weight:66  kg   Under review

## 2024-08-19 NOTE — Telephone Encounter (Signed)
 Called and spoke with daughter and advised of dose reduction. She voiced understanding.

## 2024-08-31 DIAGNOSIS — J9611 Chronic respiratory failure with hypoxia: Secondary | ICD-10-CM | POA: Diagnosis not present

## 2024-08-31 DIAGNOSIS — I5040 Unspecified combined systolic (congestive) and diastolic (congestive) heart failure: Secondary | ICD-10-CM | POA: Diagnosis not present

## 2024-09-13 ENCOUNTER — Other Ambulatory Visit: Payer: Self-pay | Admitting: Cardiology

## 2024-09-13 DIAGNOSIS — I5022 Chronic systolic (congestive) heart failure: Secondary | ICD-10-CM

## 2024-09-14 NOTE — Telephone Encounter (Signed)
 This is a CHF pt

## 2024-10-01 DIAGNOSIS — I5040 Unspecified combined systolic (congestive) and diastolic (congestive) heart failure: Secondary | ICD-10-CM | POA: Diagnosis not present

## 2024-10-01 DIAGNOSIS — J9611 Chronic respiratory failure with hypoxia: Secondary | ICD-10-CM | POA: Diagnosis not present

## 2024-10-31 ENCOUNTER — Other Ambulatory Visit: Payer: Self-pay | Admitting: Cardiology
